# Patient Record
Sex: Male | Born: 1960 | Race: White | Hispanic: No | Marital: Married | Smoking: Current every day smoker
Health system: Southern US, Community
[De-identification: ages and names within clinical notes are randomized; demographics above are authoritative.]

## PROBLEM LIST (undated history)

## (undated) DIAGNOSIS — I2510 Atherosclerotic heart disease of native coronary artery without angina pectoris: Secondary | ICD-10-CM

## (undated) DIAGNOSIS — I10 Essential (primary) hypertension: Secondary | ICD-10-CM

## (undated) DIAGNOSIS — K219 Gastro-esophageal reflux disease without esophagitis: Secondary | ICD-10-CM

## (undated) DIAGNOSIS — Z720 Tobacco use: Secondary | ICD-10-CM

## (undated) DIAGNOSIS — I5189 Other ill-defined heart diseases: Secondary | ICD-10-CM

## (undated) DIAGNOSIS — T7840XA Allergy, unspecified, initial encounter: Secondary | ICD-10-CM

## (undated) DIAGNOSIS — J449 Chronic obstructive pulmonary disease, unspecified: Secondary | ICD-10-CM

## (undated) DIAGNOSIS — I201 Angina pectoris with documented spasm: Secondary | ICD-10-CM

## (undated) DIAGNOSIS — C4490 Unspecified malignant neoplasm of skin, unspecified: Secondary | ICD-10-CM

## (undated) DIAGNOSIS — N400 Enlarged prostate without lower urinary tract symptoms: Secondary | ICD-10-CM

## (undated) DIAGNOSIS — C4492 Squamous cell carcinoma of skin, unspecified: Secondary | ICD-10-CM

## (undated) DIAGNOSIS — E785 Hyperlipidemia, unspecified: Secondary | ICD-10-CM

## (undated) DIAGNOSIS — M1990 Unspecified osteoarthritis, unspecified site: Secondary | ICD-10-CM

## (undated) DIAGNOSIS — Z9289 Personal history of other medical treatment: Secondary | ICD-10-CM

## (undated) DIAGNOSIS — I219 Acute myocardial infarction, unspecified: Secondary | ICD-10-CM

## (undated) DIAGNOSIS — F419 Anxiety disorder, unspecified: Secondary | ICD-10-CM

## (undated) DIAGNOSIS — Z87442 Personal history of urinary calculi: Secondary | ICD-10-CM

## (undated) DIAGNOSIS — F411 Generalized anxiety disorder: Secondary | ICD-10-CM

## (undated) DIAGNOSIS — C801 Malignant (primary) neoplasm, unspecified: Secondary | ICD-10-CM

## (undated) DIAGNOSIS — C4491 Basal cell carcinoma of skin, unspecified: Secondary | ICD-10-CM

## (undated) HISTORY — DX: Generalized anxiety disorder: F41.1

## (undated) HISTORY — DX: Allergy, unspecified, initial encounter: T78.40XA

## (undated) HISTORY — DX: Personal history of other medical treatment: Z92.89

## (undated) HISTORY — DX: Chronic obstructive pulmonary disease, unspecified: J44.9

## (undated) HISTORY — DX: Benign prostatic hyperplasia without lower urinary tract symptoms: N40.0

## (undated) HISTORY — DX: Tobacco use: Z72.0

## (undated) HISTORY — PX: BACK SURGERY: SHX140

## (undated) HISTORY — DX: Gastro-esophageal reflux disease without esophagitis: K21.9

## (undated) HISTORY — DX: Basal cell carcinoma of skin, unspecified: C44.91

## (undated) HISTORY — DX: Atherosclerotic heart disease of native coronary artery without angina pectoris: I25.10

## (undated) HISTORY — DX: Unspecified malignant neoplasm of skin, unspecified: C44.90

## (undated) HISTORY — DX: Other ill-defined heart diseases: I51.89

## (undated) HISTORY — PX: VASECTOMY: SHX75

## (undated) HISTORY — DX: Hyperlipidemia, unspecified: E78.5

## (undated) HISTORY — DX: Squamous cell carcinoma of skin, unspecified: C44.92

## (undated) HISTORY — DX: Anxiety disorder, unspecified: F41.9

## (undated) HISTORY — PX: TONSILLECTOMY: SUR1361

## (undated) HISTORY — DX: Unspecified osteoarthritis, unspecified site: M19.90

## (undated) HISTORY — PX: NASAL SINUS SURGERY: SHX719

## (undated) HISTORY — DX: Angina pectoris with documented spasm: I20.1

## (undated) HISTORY — PX: OTHER SURGICAL HISTORY: SHX169

---
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
## 1996-08-31 HISTORY — PX: BACK SURGERY: SHX140

## 1998-08-31 HISTORY — PX: OTHER SURGICAL HISTORY: SHX169

## 2001-09-09 ENCOUNTER — Encounter: Payer: Self-pay | Admitting: Family Medicine

## 2002-07-25 ENCOUNTER — Inpatient Hospital Stay (HOSPITAL_COMMUNITY): Admission: RE | Admit: 2002-07-25 | Discharge: 2002-07-26 | Payer: Self-pay | Admitting: Neurosurgery

## 2002-07-25 ENCOUNTER — Encounter: Payer: Self-pay | Admitting: Neurosurgery

## 2004-12-23 ENCOUNTER — Ambulatory Visit: Payer: Self-pay | Admitting: Otolaryngology

## 2005-02-16 ENCOUNTER — Emergency Department: Payer: Self-pay | Admitting: Emergency Medicine

## 2005-02-16 ENCOUNTER — Other Ambulatory Visit: Payer: Self-pay

## 2006-02-05 ENCOUNTER — Emergency Department: Payer: Self-pay | Admitting: Emergency Medicine

## 2006-04-29 ENCOUNTER — Emergency Department: Payer: Self-pay

## 2006-05-14 ENCOUNTER — Emergency Department: Payer: Self-pay | Admitting: Emergency Medicine

## 2006-05-24 ENCOUNTER — Emergency Department: Payer: Self-pay | Admitting: Emergency Medicine

## 2008-09-20 ENCOUNTER — Ambulatory Visit: Payer: Self-pay | Admitting: Family Medicine

## 2008-09-20 DIAGNOSIS — R5383 Other fatigue: Secondary | ICD-10-CM | POA: Insufficient documentation

## 2008-09-20 DIAGNOSIS — R5381 Other malaise: Secondary | ICD-10-CM | POA: Insufficient documentation

## 2008-09-20 DIAGNOSIS — R079 Chest pain, unspecified: Secondary | ICD-10-CM | POA: Insufficient documentation

## 2008-09-20 DIAGNOSIS — F528 Other sexual dysfunction not due to a substance or known physiological condition: Secondary | ICD-10-CM | POA: Insufficient documentation

## 2008-09-21 DIAGNOSIS — K219 Gastro-esophageal reflux disease without esophagitis: Secondary | ICD-10-CM | POA: Insufficient documentation

## 2008-09-24 DIAGNOSIS — R7309 Other abnormal glucose: Secondary | ICD-10-CM | POA: Insufficient documentation

## 2008-09-24 LAB — CONVERTED CEMR LAB
ALT: 21 units/L (ref 0–53)
AST: 22 units/L (ref 0–37)
Albumin: 3.8 g/dL (ref 3.5–5.2)
Alkaline Phosphatase: 79 units/L (ref 39–117)
BUN: 15 mg/dL (ref 6–23)
Basophils Absolute: 0 10*3/uL (ref 0.0–0.1)
Basophils Relative: 0.1 % (ref 0.0–3.0)
Bilirubin, Direct: 0.1 mg/dL (ref 0.0–0.3)
CO2: 27 meq/L (ref 19–32)
Calcium: 8.8 mg/dL (ref 8.4–10.5)
Chloride: 104 meq/L (ref 96–112)
Cholesterol: 192 mg/dL (ref 0–200)
Creatinine, Ser: 1 mg/dL (ref 0.4–1.5)
Eosinophils Absolute: 0.3 10*3/uL (ref 0.0–0.7)
Eosinophils Relative: 3.7 % (ref 0.0–5.0)
GFR calc Af Amer: 103 mL/min
GFR calc non Af Amer: 85 mL/min
Glucose, Bld: 144 mg/dL — ABNORMAL HIGH (ref 70–99)
HCT: 45.9 % (ref 39.0–52.0)
HDL: 28.9 mg/dL — ABNORMAL LOW (ref >39.0)
Hemoglobin: 15.7 g/dL (ref 13.0–17.0)
LDL Cholesterol: 148 mg/dL — ABNORMAL HIGH (ref 0–99)
Lymphocytes Relative: 24.6 % (ref 12.0–46.0)
MCHC: 34.1 g/dL (ref 30.0–36.0)
MCV: 93.3 fL (ref 78.0–100.0)
Monocytes Absolute: 0.7 10*3/uL (ref 0.1–1.0)
Monocytes Relative: 7.3 % (ref 3.0–12.0)
Neutro Abs: 5.9 10*3/uL (ref 1.4–7.7)
Neutrophils Relative %: 64.3 % (ref 43.0–77.0)
PSA: 1.06 ng/mL (ref 0.10–4.00)
Platelets: 238 10*3/uL (ref 150–400)
Potassium: 4.2 meq/L (ref 3.5–5.1)
RBC: 4.92 M/uL (ref 4.22–5.81)
RDW: 13.7 % (ref 11.5–14.6)
Sodium: 138 meq/L (ref 135–145)
Total Bilirubin: 0.7 mg/dL (ref 0.3–1.2)
Total CHOL/HDL Ratio: 6.6
Total Protein: 6.7 g/dL (ref 6.0–8.3)
Triglycerides: 77 mg/dL (ref 0–149)
VLDL: 15 mg/dL (ref 0–40)
WBC: 9.2 10*3/uL (ref 4.5–10.5)

## 2008-10-10 ENCOUNTER — Encounter (INDEPENDENT_AMBULATORY_CARE_PROVIDER_SITE_OTHER): Payer: Self-pay | Admitting: *Deleted

## 2010-03-20 ENCOUNTER — Ambulatory Visit: Payer: Self-pay | Admitting: Family Medicine

## 2010-03-20 ENCOUNTER — Observation Stay (HOSPITAL_COMMUNITY): Admission: EM | Admit: 2010-03-20 | Discharge: 2010-03-21 | Payer: Self-pay | Admitting: Emergency Medicine

## 2010-03-20 ENCOUNTER — Ambulatory Visit: Payer: Self-pay | Admitting: Cardiology

## 2010-03-20 ENCOUNTER — Telehealth: Payer: Self-pay | Admitting: Family Medicine

## 2010-03-20 DIAGNOSIS — M542 Cervicalgia: Secondary | ICD-10-CM | POA: Insufficient documentation

## 2010-03-20 DIAGNOSIS — R209 Unspecified disturbances of skin sensation: Secondary | ICD-10-CM | POA: Insufficient documentation

## 2010-03-20 DIAGNOSIS — R61 Generalized hyperhidrosis: Secondary | ICD-10-CM | POA: Insufficient documentation

## 2010-03-21 ENCOUNTER — Encounter: Payer: Self-pay | Admitting: Cardiovascular Disease

## 2010-03-26 ENCOUNTER — Telehealth: Payer: Self-pay | Admitting: Cardiovascular Disease

## 2010-04-02 ENCOUNTER — Ambulatory Visit: Payer: Self-pay | Admitting: Cardiovascular Disease

## 2010-04-04 ENCOUNTER — Ambulatory Visit: Payer: Self-pay | Admitting: Cardiovascular Disease

## 2010-04-09 ENCOUNTER — Ambulatory Visit: Payer: Self-pay | Admitting: Family Medicine

## 2010-04-09 DIAGNOSIS — M25519 Pain in unspecified shoulder: Secondary | ICD-10-CM | POA: Insufficient documentation

## 2010-04-09 DIAGNOSIS — E782 Mixed hyperlipidemia: Secondary | ICD-10-CM | POA: Insufficient documentation

## 2010-07-09 ENCOUNTER — Ambulatory Visit: Payer: Self-pay | Admitting: Family Medicine

## 2010-07-09 DIAGNOSIS — M545 Low back pain, unspecified: Secondary | ICD-10-CM | POA: Insufficient documentation

## 2010-07-09 DIAGNOSIS — N289 Disorder of kidney and ureter, unspecified: Secondary | ICD-10-CM | POA: Insufficient documentation

## 2010-07-09 DIAGNOSIS — R3129 Other microscopic hematuria: Secondary | ICD-10-CM | POA: Insufficient documentation

## 2010-07-09 LAB — CONVERTED CEMR LAB
Bilirubin Urine: NEGATIVE
Glucose, Urine, Semiquant: NEGATIVE
Ketones, urine, test strip: NEGATIVE
Nitrite: NEGATIVE
Protein, U semiquant: 30
Specific Gravity, Urine: 1.025
Urobilinogen, UA: 0.2
WBC Urine, dipstick: NEGATIVE
pH: 6

## 2010-07-10 LAB — CONVERTED CEMR LAB
BUN: 12 mg/dL (ref 6–23)
CO2: 28 meq/L (ref 19–32)
Calcium: 9.1 mg/dL (ref 8.4–10.5)
Chloride: 105 meq/L (ref 96–112)
Creatinine, Ser: 0.9 mg/dL (ref 0.4–1.5)
GFR calc non Af Amer: 97.33 mL/min (ref >60)
Glucose, Bld: 86 mg/dL (ref 70–99)
Potassium: 3.9 meq/L (ref 3.5–5.1)
Sodium: 141 meq/L (ref 135–145)

## 2010-07-16 ENCOUNTER — Encounter: Payer: Self-pay | Admitting: Family Medicine

## 2010-08-31 HISTORY — PX: KNEE ARTHROSCOPY W/ PARTIAL MEDIAL MENISCECTOMY: SHX1882

## 2010-09-28 LAB — CONVERTED CEMR LAB
ALT: 11 units/L (ref 0–53)
ALT: 11 units/L (ref 0–53)
AST: 13 units/L (ref 0–37)
AST: 18 units/L (ref 0–37)
Albumin: 4.6 g/dL (ref 3.5–5.2)
Albumin: 4.8 g/dL (ref 3.5–5.2)
Alkaline Phosphatase: 72 units/L (ref 39–117)
Alkaline Phosphatase: 77 units/L (ref 39–117)
BUN: 15 mg/dL (ref 6–23)
Basophils Absolute: 0.1 10*3/uL (ref 0.0–0.1)
Basophils Relative: 0.5 % (ref 0.0–3.0)
Bilirubin, Direct: 0.1 mg/dL (ref 0.0–0.3)
Bilirubin, Direct: 0.1 mg/dL (ref 0.0–0.3)
CK-MB: 1.3 ng/mL (ref 0.3–4.0)
CO2: 29 meq/L (ref 19–32)
Calcium: 9.3 mg/dL (ref 8.4–10.5)
Chloride: 107 meq/L (ref 96–112)
Cholesterol: 179 mg/dL (ref 0–200)
Creatinine, Ser: 1 mg/dL (ref 0.4–1.5)
Eosinophils Absolute: 0.3 10*3/uL (ref 0.0–0.7)
Eosinophils Relative: 1.7 % (ref 0.0–5.0)
GFR calc non Af Amer: 83.12 mL/min (ref >60)
Glucose, Bld: 111 mg/dL — ABNORMAL HIGH (ref 70–99)
HCT: 45.3 % (ref 39.0–52.0)
HDL: 36 mg/dL — ABNORMAL LOW (ref >39)
Hemoglobin: 15.4 g/dL (ref 13.0–17.0)
Indirect Bilirubin: 0.2 mg/dL (ref 0.0–0.9)
LDL Cholesterol: 123 mg/dL — ABNORMAL HIGH (ref 0–99)
Lymphocytes Relative: 16.1 % (ref 12.0–46.0)
Lymphs Abs: 2.6 10*3/uL (ref 0.7–4.0)
MCHC: 34.1 g/dL (ref 30.0–36.0)
MCV: 94.3 fL (ref 78.0–100.0)
Monocytes Absolute: 1.1 10*3/uL — ABNORMAL HIGH (ref 0.1–1.0)
Monocytes Relative: 7.1 % (ref 3.0–12.0)
Neutro Abs: 12.1 10*3/uL — ABNORMAL HIGH (ref 1.4–7.7)
Neutrophils Relative %: 74.6 % (ref 43.0–77.0)
PSA: 1.06 ng/mL (ref 0.10–4.00)
Platelets: 209 10*3/uL (ref 150.0–400.0)
Potassium: 4.3 meq/L (ref 3.5–5.1)
RBC: 4.8 M/uL (ref 4.22–5.81)
RDW: 14.9 % — ABNORMAL HIGH (ref 11.5–14.6)
Relative Index: 1.6 (ref 0.0–2.5)
Sodium: 145 meq/L (ref 135–145)
Total Bilirubin: 0.3 mg/dL (ref 0.3–1.2)
Total Bilirubin: 0.5 mg/dL (ref 0.3–1.2)
Total CHOL/HDL Ratio: 5
Total CK: 81 units/L (ref 7–232)
Total Protein: 7.2 g/dL (ref 6.0–8.3)
Total Protein: 7.4 g/dL (ref 6.0–8.3)
Triglycerides: 99 mg/dL (ref <150)
VLDL: 20 mg/dL (ref 0–40)
WBC: 16.2 10*3/uL — ABNORMAL HIGH (ref 4.5–10.5)

## 2010-09-30 NOTE — Assessment & Plan Note (Signed)
Summary: NP6/GLC   Visit Type:  Initial Consult Primary Provider:  Hannah Beat MD  CC:  c/o sharp pains in chest with shortness of breath..  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 Dallas Schimke for further Evaluation. D-dimer was 0.66 and he had a negative CT scan.   a stress echocardiogram  was performed that showed no significant ischemia. He achieved a heart rate greater than 150. Cardiac enzymes were negative during his hospital stay.  He presents today and reports no chest pain. He believes that much of his problem is due to his underlying neck discomfort with paravertebral muscle spasms. He has been unable to tolerate the muscle relaxants medicine as it makes him feel sleepy in the morning. He denies any chest pain with activity.   Cholesterol 151, triglycerides 97, HDL 30, LDL 102.   Current Medications (verified): 1)  Adult Aspirin Low Strength 81 Mg Tbdp (Aspirin) .... Take One By Mouth Daily 2)  Tizanidine Hcl 4 Mg Tabs (Tizanidine Hcl) .Marland Kitchen.. 1 By Mouth At Bedtime 3)  Fish Oil 1000 Mg Caps (Omega-3 Fatty Acids) .Marland Kitchen.. 1 Once Daily 4)  Prilosec 20 Mg Cpdr (Omeprazole) .Marland Kitchen.. 1 Once Daily  Allergies (verified): No Known Drug Allergies  Past History:  Past Medical History: Last updated: 09/20/2008 OA Smoker GERD  Past Surgical History: Last updated: 09/20/2008 Lower back surgery, 2000 Neck, fusion, 2002 Sinus surgery, 2005  Family History: Last updated: 09/20/2008 Father, CAD, OA Brother, d/cCAD, MI, 25, 32, MI Brother, d/c MI, 60 HTN  Social History: Last updated: 09/20/2008 Son of [employer] ([employer]) [employer] now Divorced with  GF 50 pack year history, 30 years + ETOH, 12/week No Drugs Likes to work out  Review of Systems  The patient denies fever, weight loss, weight gain, vision loss, decreased hearing, hoarseness, chest pain, syncope, dyspnea on exertion, peripheral edema, prolonged cough, abdominal pain, incontinence, muscle weakness, depression, and enlarged lymph nodes.         chronic neck pain, radiating to shoulders  Vital Signs:  Patient profile:   50 year old male Height:      65.25 inches Weight:      141 pounds BMI:     23.37 Pulse rate:   61 / minute BP sitting:   129 / 85  (left arm) Cuff size:   regular  Vitals Entered By: Bishop Dublin, CMA (April 04, 2010 10:23 AM)  Physical Exam  General:  Well developed, well nourished, in no acute distress. Head:  normocephalic and atraumatic Neck:  Neck supple, no JVD. No masses, thyromegaly or abnormal cervical nodes. Lungs:  Clear bilaterally to auscultation and percussion. Heart:  Non-displaced PMI, chest non-tender; regular rate and rhythm, S1, S2 without murmurs, rubs or gallops. Carotid upstroke normal, no bruit. Normal abdominal aortic size, no bruits.  Pedals normal pulses. No edema, no varicosities. Abdomen:  Bowel sounds positive; abdomen soft and non-tender without masses Msk:  Back normal, normal gait. Muscle strength and tone normal. Pulses:  pulses normal in all 4 extremities Extremities:  No clubbing or cyanosis. Neurologic:  Alert and oriented x 3. Skin:  Intact without lesions or rashes. Psych:  Normal affect.  Impression & Recommendations:  Problem # 1:  CHEST PAIN (ICD-786.50) no significant chest pain symptoms concerning for angina. He had a [facility]. No further workup needed at this time. we will check his cholesterol today. He has indicated that he would like to treat his lipids aggressively. I recommend we have a goal LDL less than 100 given his strong family history  and continued smoking.  His updated medication list for this problem includes:    Adult Aspirin Low Strength 81 Mg Tbdp (Aspirin) .Marland Kitchen... Take one by mouth daily  Orders: EKG w/ Interpretation (93000)  Problem # 2:  SMOKER (ICD-305.1) We have strongly encouraged him to stop smoking. He does not want to try change it or patches at this time. he does have a very strong family history of coronary artery disease. For this reason alone, he is at high risk and needs to stop smoking.  Other Orders: T-Hepatic Function [phone]) T-Cholesterol Serum Total [phone]) T-Lipid Profile [id number])  Patient Instructions: 1)  Your physician recommends that you have a FASTING lipid profile: today (lip/lft) 2)  Your physician recommends that you continue on your current medications as directed. Please refer to the Current Medication list given to you today.

## 2010-09-30 NOTE — Assessment & Plan Note (Signed)
Summary: NECK SPASMS   Vital Signs:  Patient profile:   50 year old male Height:      65.25 inches Weight:      142.2 pounds BMI:     23.57 Temp:     98.8 degrees F oral Pulse rate:   68 / minute Pulse rhythm:   regular BP sitting:   120 / 76  (left arm) Cuff size:   regular  Vitals Entered By: Benny Lennert CMA Duncan Dull) (March 20, 2010 12:12 PM)  History of Present Illness: Chief complaint neck spasms  50 year old male who presents initially with neck pain and spasms but also not feeling well and "just not feeling right" for a couple of days.  While getting neck xrays, the patient got exceedingly sweaty, diaphoretic, pale, had difficulty walking, and threw up multiple times. Was placed in reverse trendelenburg, and felt much better. Ultimately had some slight nausea and occ tingling in his left hand at the distal 1-3 finger tips.  Has a plate in his neck -- arms feel like they have a a lot of weight hanging on his neck with a great deal of muscle spasm and loss of motion in the neck. Doing   lot of commercial heating and air.   no numbness or tingling. Work up the other night and hands were asleep. Ten minutes that returned.  -- tingling developed L finger tips as above.  [facility] [facility].   No CP, No SOB  Cardiac Risk Factors include multiple family members with CAD, MI as early as in their 41's including brothers and father. 2 pack a day smoker. Active gentleman.  Allergies (verified): No Known Drug Allergies  Past History:  Past medical, surgical, family and social histories (including risk factors) reviewed, and no changes noted (except as noted below).  Past Medical History: Reviewed history from 09/20/2008 and no changes required. OA Smoker GERD  Past Surgical History: Reviewed history from 09/20/2008 and no changes required. Lower back surgery, 2000 Neck, fusion, 2002 Sinus surgery, 2005  Family History: Reviewed history from 09/20/2008 and no  changes required. Father, CAD, OA Brother, d/cCAD, MI, 68, 41, MI Brother, d/c MI, 62 HTN  Social History: Reviewed history from 09/20/2008 and no changes required. Son of Javel Hersh ([employer]) [employer] now Divorced with GF 50 pack year history, 30 years + ETOH, 12/week No Drugs Likes to work out  Review of Systems      See HPI General:  Complains of fatigue and sweats; denies fever. CV:  Complains of fainting, lightheadness, and near fainting; denies chest pain or discomfort, palpitations, shortness of breath with exertion, swelling of feet, and swelling of hands. Resp:  Denies cough, shortness of breath, sputum productive, and wheezing. GI:  Complains of nausea and vomiting. MS:  See HPI. Neuro:  See HPI.  Physical Exam  General:  Intermittently normal appearing vs ill appearing when acutely pale and sweating, nauseous and vomitting. Head:  Normocephalic and atraumatic without obvious abnormalities. No apparent alopecia or balding. Ears:  External ear exam shows no significant lesions or deformities.  Otoscopic examination reveals clear canals, tympanic membranes are intact bilaterally without bulging, retraction, inflammation or discharge. Hearing is grossly normal bilaterally. Nose:  no external deformity.   Mouth:  Oral mucosa and oropharynx without lesions or exudates.  Teeth in good repair. Neck:  No deformities, masses, or tenderness noted. Chest Wall:  No deformities, masses, tenderness or gynecomastia noted. Lungs:  Normal respiratory effort, chest expands symmetrically. Lungs  are clear to auscultation, no crackles or wheezes. Heart:  Normal rate and regular rhythm. S1 and S2 normal without gallop, murmur, click, rub or other extra sounds. Abdomen:  Bowel sounds positive,abdomen soft and non-tender without masses, organomegaly or hernias noted. Msk:  diffuse loss of motion in all directions in cervical spine with a negative spurlings c5-t1 intact. Extremities:   No clubbing, cyanosis, edema, or deformity noted with normal full range of motion of all joints.   Neurologic:  sensation intact to light touch and gait normal.  dtr 2+ Cervical Nodes:  No lymphadenopathy noted Psych:  Cognition and judgment appear intact. Alert and cooperative with normal attention span and concentration. No apparent delusions, illusions, hallucinations   Impression & Recommendations:  Problem # 1:  DIAPHORESIS (ICD-780.8) Assessment New EKG: Normal sinus rhythm. Normal axis, normal R wave progression, No acute ST elevation or depression.   Concerning with extremely bad family history, very large smoking history, nausea, diaphoresis, some slight L hand tingling. Confounding factor with h/o complex neck pathology, very large osteophyte formation - this may be referred from neck.  In light of this presentation, check labs below and will have the patient follow-up with Cardiology tomorrow. This could be an evolving anginal equivalent, d/w the patient that if he worsens in any way to call 911. He did not want to go to the hospital right now.  >45 minutes spent in total face to face time with the patient with >50% of time spent in counselling and coordination of care   Orders: TLB-BMP (Basic Metabolic Panel-BMET) (80048-METABOL) TLB-CBC Platelet - w/Differential (85025-CBCD) TLB-Hepatic/Liver Function Pnl (80076-HEPATIC) T-D-Dimer Fibrin Derivatives Quantitive ([id number]) Specimen Handling (53664) Cardiology Referral (Cardiology)  Problem # 2:  DISTURBANCE OF SKIN SENSATION (ICD-782.0) Assessment: New  Orders: Venipuncture (40347) TLB-Cardiac Panel ([id number]_63875-IEPP)  Problem # 3:  CERVICALGIA (ICD-723.1) Assessment: Deteriorated Films reviewed, fusion stable, large anterior spurs noted  His updated medication list for this problem includes:    Adult Aspirin Low Strength 81 Mg Tbdp (Aspirin) .Marland Kitchen... Take one by mouth daily    Tizanidine Hcl 4 Mg Tabs (Tizanidine  hcl) .Marland Kitchen... 1 by mouth at bedtime  Orders: T-Cervical Spine Comp 4 Views (72050TC)  Complete Medication List: 1)  Adult Aspirin Low Strength 81 Mg Tbdp (Aspirin) .... Take one by mouth daily 2)  Tizanidine Hcl 4 Mg Tabs (Tizanidine hcl) .Marland Kitchen.. 1 by mouth at bedtime  Other Orders: TLB-PSA (Prostate Specific Antigen) (84153-PSA)  Patient Instructions: 1)  Referral Appointment Information 2)  Day/Date: 3)  Time: 4)  Place/MD: 5)  Address: 6)  Phone/Fax: 7)  Patient given appointment information. Information/Orders faxed/mailed.  Prescriptions: TIZANIDINE HCL 4 MG TABS (TIZANIDINE HCL) 1 by mouth at bedtime  #30 x 1   Entered and Authorized by:   Hannah Beat MD   Signed by:   Hannah Beat MD on 03/20/2010   Method used:   [facility]       [address]       [place], [place]  [address]       Ph: [phone]       Fax: [phone]   RxID:   [id number]   Current Allergies (reviewed today): No known allergies

## 2010-09-30 NOTE — Assessment & Plan Note (Signed)
Summary: KIDNEY PAIN/CLE   Vital Signs:  Patient profile:   50 year old male Height:      65.25 inches Weight:      143.0 pounds BMI:     23.70 Temp:     98.2 degrees F oral Pulse rate:   64 / minute Pulse rhythm:   regular BP sitting:   110 / 70  (left arm) Cuff size:   regular  Vitals Entered By: Benny Lennert CMA Duncan Dull) (July 09, 2010 11:54 AM)  History of Present Illness: Chief complaint kidney pain  MR in 02/2010 of the abdomen with and without contrast revealing 8-mm lesion     in the upper pole of the left kidney remaining indeterminate     limited evaluation due to size and breathing artifact, this may     reflect hemorrhagic cyst, although small papillary-type renal cell     carcinoma could have similar appearance, no aggressive     characteristics are identified, simple cyst in the lower pole of     the left kidney with no acute abdominal findings.  Ultrasound would     be likely unhelpful.  MR without contrast follow up in 6 months is     recommended.  Began having pain in left mid to lower back pain..focally. Constant in past few weeks. Pain keeps him up at night. Increased frequency and urgency of urination. No blood in urine.  No fever. No puritis.  Nausea intermittantly. In past 2 weeks noted nodule in same area, althoug not tender.  No pain running down legs or any change in chronic low back pain.  Father with kidney stones..no family history of kidney cancer.  Problems Prior to Update: 1)  Unspecified Disorder of Kidney and Ureter  (ICD-593.9) 2)  Microscopic Hematuria  (ICD-599.72) 3)  Low Back Pain, Acute  (ICD-724.2) 4)  Shoulder Pain  (ICD-719.41) 5)  Hyperlipidemia  (ICD-272.4) 6)  Smoker  (ICD-305.1) 7)  Diaphoresis  (ICD-780.8) 8)  Disturbance of Skin Sensation  (ICD-782.0) 9)  Cervicalgia  (ICD-723.1) 10)  Other Abnormal Glucose  (ICD-790.29) 11)  Chest Pain  (ICD-786.50) 12)  Erectile Dysfunction  (ICD-302.72) 13)  Gerd   (ICD-530.81) 14)  Screening, Diabetes Mellitus  (ICD-V77.1) 15)  Screening For Lipoid Disorders  (ICD-V77.91) 16)  Fatigue  (ICD-780.79) 17)  Special Screening Malignant Neoplasm of Prostate  (ICD-V76.44)  Current Medications (verified): 1)  Adult Aspirin Low Strength 81 Mg Tbdp (Aspirin) .... Take One By Mouth Daily 2)  Fish Oil 1000 Mg Caps (Omega-3 Fatty Acids) .Marland Kitchen.. 1 Once Daily 3)  Prilosec 20 Mg Cpdr (Omeprazole) .Marland Kitchen.. 1 Once Daily 4)  Simvastatin 20 Mg Tabs (Simvastatin) .... Take One Tablet By Mouth Daily At Bedtime 5)  Gabapentin 300 Mg Caps (Gabapentin) .Marland Kitchen.. 1 By Mouth At Bedtime 6)  Multivitamins  Caps (Multiple Vitamin) .... One Tablet Dialy 7)  Hydrocodone-Acetaminophen 5-500 Mg Tabs (Hydrocodone-Acetaminophen) .Marland Kitchen.. 1 Tab By Mouth Two Times A Day As Needed For Pain  Allergies (verified): No Known Drug Allergies  Past History:  Past medical, surgical, family and social histories (including risk factors) reviewed, and no changes noted (except as noted below).  Past Medical History: Reviewed history from 04/09/2010 and no changes required. OA Smoker GERD Hyperlipidemia  Past Surgical History: Reviewed history from 04/09/2010 and no changes required. Lower back surgery, 2000 Neck, fusion, 2002 Sinus surgery, 2005  Stress echo, 03/2010, normal  Family History: Reviewed history from 09/20/2008 and no changes required. Father, CAD, OA Brother,  d/cCAD, MI, 33, 47, MI Brother, d/c MI, 48 HTN  Social History: Reviewed history from 09/20/2008 and no changes required. Son of Harvis Mabus ([employer]) [employer] now Divorced with GF 50 pack year history, 30 years + ETOH, 12/week No Drugs Likes to work out  Review of Systems General:  Complains of fatigue. CV:  Denies chest pain or discomfort. Resp:  Denies shortness of breath. GI:  Complains of abdominal pain. GU:  Denies dysuria.  Physical Exam  General:  thin apearing male in NAD Mouth:  MMM Neck:  no  carotid bruit or thyromegaly .ndoes  Lungs:  Normal respiratory effort, chest expands symmetrically. Lungs are clear to auscultation, no crackles or wheezes. Heart:  Normal rate and regular rhythm. S1 and S2 normal without gallop, murmur, click, rub or other extra sounds. Abdomen:  Bowel sounds positive,abdomen soft and non-tender without masses, organomegaly or hernias noted. Msk:  ttp in left lower back, near paraspinous muscle, near small nodule similar to cyst..not over CVA  Neg SLR, Neg Faber's  Decrease ROM flexion and ext due to pain Pulses:  R and L posterior tibial pulses are full and equal bilaterally  Extremities:  no edema Neurologic:  No cranial nerve deficits noted. Station and gait are normal. Plantar reflexes are down-going bilaterally. DTRs are symmetrical throughout. Sensory, motor and coordinative functions appear intact.   Impression & Recommendations:  Problem # 1:  UNSPECIFIED DISORDER OF KIDNEY AND URETER (ICD-593.9) Unclear cause of pain..not clearly due to finding on MRI.  ? due to MSK strain in low back? Pt feels this is not consistent with pain he has in low back in past. Given cysts seen in kidney, that now may be sympomatic...will eval with BMET and it is reasonable to  refer to urologist for recommendations for further eval as opposed to waiting until 6 month MRI repeat due. Small blood in urine..possible kidney stone although unlikely as none seen on recent imaging.  No cast seen suggesting renal failure.   No clear sign of infection.Marland Kitchenbut will verify with U culture.   Psuhj fluids and treat with vicodin as needed pain.  Orders: T-Culture, Urine ([id number]) Urology Referral (Urology) TLB-BMP (Basic Metabolic Panel-BMET) (80048-METABOL)  Problem # 2:  MICROSCOPIC HEMATURIA (ICD-599.72)  Orders: T-Culture, Urine ([id number]) Urology Referral (Urology)  Complete Medication List: 1)  Adult Aspirin Low Strength 81 Mg Tbdp (Aspirin) .... Take one by mouth  daily 2)  Fish Oil 1000 Mg Caps (Omega-3 fatty acids) .Marland Kitchen.. 1 once daily 3)  Prilosec 20 Mg Cpdr (Omeprazole) .Marland Kitchen.. 1 once daily 4)  Simvastatin 20 Mg Tabs (Simvastatin) .... Take one tablet by mouth daily at bedtime 5)  Gabapentin 300 Mg Caps (Gabapentin) .Marland Kitchen.. 1 by mouth at bedtime 6)  Multivitamins Caps (Multiple vitamin) .... One tablet dialy 7)  Hydrocodone-acetaminophen 5-500 Mg Tabs (Hydrocodone-acetaminophen) .Marland Kitchen.. 1 tab by mouth two times a day as needed for pain  Other Orders: Admin 1st Vaccine (30865) Flu Vaccine 44yrs + (78469) UA Dipstick W/ Micro (manual) (81000)  Patient Instructions: 1)  Push fluids. 2)  Vicodin as needed for pain. 3)   Referral Appointment Information 4)  Day/Date: 5)  Time: 6)  Place/MD: 7)  Address: 8)  Phone/Fax: 9)  Patient given appointment information. Information/Orders faxed/mailed.  Prescriptions: HYDROCODONE-ACETAMINOPHEN 5-500 MG TABS (HYDROCODONE-ACETAMINOPHEN) 1 tab by mouth two times a day as needed for pain  #30 x 0   Entered and Authorized by:   Kerby Nora MD   Signed by:  Kerby Nora MD on 07/09/2010   Method used:   Print then Give to Patient   RxID:   [id number]    Orders Added: 1)  Admin 1st Vaccine [90471] 2)  Flu Vaccine 62yrs + [14782] 3)  UA Dipstick W/ Micro (manual) [81000] 4)  T-Culture, Urine [[id number]] 5)  Urology Referral [Urology] 6)  TLB-BMP (Basic Metabolic Panel-BMET) [80048-METABOL] 7)  Est. Patient Level IV [78469]    Current Allergies (reviewed today): No known allergies                Flu Vaccine Consent Questions     Do you have a history of severe allergic reactions to this vaccine? no    Any prior history of allergic reactions to egg and/or gelatin? no    Do you have a sensitivity to the preservative Thimersol? no    Do you have a past history of Guillan-Barre Syndrome? no    Do you currently have an acute febrile illness? no    Have you ever had a severe reaction to  latex? no    Vaccine information given and explained to patient? yes    Are you currently pregnant? no    Lot Number:[id number]   Exp Date:02/28/2011   Site Given  Left Deltoid IM  [email or web address]  Laboratory Results   Urine Tests  Date/Time Received: July 09, 2010 12:42 PM  Date/Time Reported: July 09, 2010 12:42 PM   Routine Urinalysis   Color: yellow Appearance: Clear Glucose: negative   (Normal Range: Negative) Bilirubin: negative   (Normal Range: Negative) Ketone: negative   (Normal Range: Negative) Spec. Gravity: 1.025   (Normal Range: 1.003-1.035) Blood: moderate   (Normal Range: Negative) pH: 6.0   (Normal Range: 5.0-8.0) Protein: 30   (Normal Range: Negative) Urobilinogen: 0.2   (Normal Range: 0-1) Nitrite: negative   (Normal Range: Negative) Leukocyte Esterace: negative   (Normal Range: Negative)  Urine Microscopic WBC/HPF: none RBC/HPF: 2-3 Casts/LPF: none        Appended Document: KIDNEY PAIN/CLE Fax note to urologist along wth BMET, U culture and MRI. Patient has appt on 11/16  Appended Document: KIDNEY PAIN/CLE Shirlee Limerick has already faxed info.Consuello Masse CMA

## 2010-09-30 NOTE — Consult Note (Signed)
Summary: [facility]   Imported By: Maryln Gottron 07/25/2010 15:54:27  _____________________________________________________________________  External Attachment:    Type:   Image     Comment:   External Document

## 2010-09-30 NOTE — Letter (Signed)
Summary: [facility]/Dr. Gwen Pounds  [facility]/Dr. Gwen Pounds   Imported By: Eleonore Chiquito 09/25/2008 15:06:29  _____________________________________________________________________  External Attachment:    Type:   Image     Comment:   External Document  Appended Document: [facility]/Dr. Gwen Pounds Records reviewed. Neg CP work-up. Suspect that ETT was done at [facility] outpatient by Dr. Gwen Pounds - not done at [facility]. Will discuss at follow-up.

## 2010-09-30 NOTE — Assessment & Plan Note (Signed)
Summary: NEW PT TO EST/CLE   Vital Signs:  Patient Profile:   50 Years Old Male Height:     65.25 inches Weight:      143 pounds BMI:     23.70 Temp:     98.3 degrees F oral Pulse rate:   68 / minute Pulse rhythm:   regular BP sitting:   100 / 62  (right arm) Cuff size:   regular  Vitals Entered By: Lowella Petties (September 20, 2008 2:01 PM)                 Chief Complaint:  New patient to establish care.  History of Present Illness: 50 year old male patient here as a new patient here to establish care:  1. Here with his girlfriend, brother died suddenly at 40, another brother had a MI as well. Worried about that - no recent labs, chol check, etc.  2. Erectile difficulties: the patient is here with his girlfriend, and they have some questions about sexual issues. On 2 occasions, he has had an episode where he lost his reduction, but afterwards he was able to achieve an erection and complete intercourse. Subsequently, I interviewed the patient alone. He is routinely able to achieve intercourse. He is able to penetrate the vagina. He is able masturbate successfully alone. he is not particularly worried about this, however his girlfriend is, which prompted a visit. He did occasionally have episodes where even with this prior wife when he was thinking about other things during sex, he would lose erection.  100/62  3. History of gastroesophageal reflux disease: The patient is control this with diet maintenance, he is decreased fatty foods, tomatoes, and other things that exacerbate including citrus and controlled well without medication.  4. h/o CP admision: he had some chest pain with radiating pain and numbness down his LEFT arm and was subsequently admitted to the hospital. He had laboratories and EKGs which were negative per his report. No official hospital records are available. Subsequently, the patient had an outpatient stress test on a treadmill which was negative. 2005, CP,  admitted to hospital. Ruled out for heart attack. Left arm numb.  [facility], r/o.  Stress Test, on treadmill came back normal.      Current Allergies: No known allergies   Past Medical History:    OA    Smoker    GERD  Past Surgical History:    Lower back surgery, 2000    Neck, fusion, 2002    Sinus surgery, 2005   Family History:    Father, CAD, OA    Brother, d/cCAD, MI, 67, 58, MI    Brother, d/c MI, 61    HTN  Social History:    Son of Sylvester Minton ([employer])    [employer] now    Divorced with GF    50 pack year history, 30 years    + ETOH, 12/week    No Drugs    Likes to work out    Review of Systems      See HPI       Otherwise, the pertinent positives and negatives are listed above and in the HPI, otherwise a full review of systems has been reviewed and is negative unless noted positive.   General      Denies chills, fatigue, fever, and sweats.  ENT      Denies nasal congestion.  CV      See HPI      Denies  chest pain or discomfort, difficulty breathing at night, difficulty breathing while lying down, fainting, leg cramps with exertion, lightheadness, near fainting, palpitations, and shortness of breath with exertion.  Resp      Denies cough, shortness of breath, and wheezing.  GI      Denies nausea and vomiting.  GU      See HPI      Complains of erectile dysfunction.      Denies genital sores, hematuria, and incontinence.      no exposure to STDs. No penile discharge. No ulceration.  Derm      Denies lesion(s) and rash.  Neuro      Denies headaches, numbness, and tingling.  Psych      Denies anxiety and depression.  Endo      Denies excessive urination, heat intolerance, and polyuria.   Physical Exam  General:     Well-developed,well-nourished,in no acute distress; alert,appropriate and cooperative throughout examination Head:     Normocephalic and atraumatic without obvious abnormalities. No apparent alopecia or balding.  Eyes:     vision grossly intact.   Ears:     no external deformities.   Nose:     no external deformity.   Mouth:     Oral mucosa and oropharynx without lesions or exudates.  Teeth in good repair. Neck:     No deformities, masses, or tenderness noted. Lungs:     Normal respiratory effort, chest expands symmetrically. Lungs are clear to auscultation, no crackles or wheezes. Heart:     Normal rate and regular rhythm. S1 and S2 normal without gallop, murmur, click, rub or other extra sounds. Abdomen:     Bowel sounds positive,abdomen soft and non-tender without masses, organomegaly or hernias noted. Msk:     normal ROM.   Extremities:     No clubbing, cyanosis, edema, or deformity noted with normal full range of motion of all joints.   Neurologic:     alert & oriented X3, sensation intact to light touch, and gait normal.   Cervical Nodes:     No lymphadenopathy noted Psych:     Cognition and judgment appear intact. Alert and cooperative with normal attention span and concentration. No apparent delusions, illusions, hallucinations    Impression & Recommendations:  Problem # 1:  ERECTILE DYSFUNCTION (ICD-302.72) Assessment: New counseled pretty extensively about this. It sounds as if this is normal physiological loss of erection which is not uncommon particularly with age. It doesn't sound like true impotence. He is not having anorgasmia.  Counselled and reassurance given.  Problem # 2:  CHEST PAIN (ICD-786.50) Assessment: New Approx. 5 years ago, but will get records from [facility], stress test results - completely asymptomatic now, so would not stress again unless recommended in prior stress test.  f/u if any chest pain - with FH, low threshold for repeat stress test.  Problem # 3:  GERD (ICD-530.81) Assessment: New stable  Problem # 4:  SPECIAL SCREENING MALIGNANT NEOPLASM OF PROSTATE (ICD-V76.44)  Orders: TLB-PSA (Prostate Specific Antigen) (84153-PSA) Venipuncture  ([address])   Problem # 5:  FATIGUE (ICD-780.79) Assessment: New  Orders: TLB-CBC Platelet - w/Differential (85025-CBCD) TLB-Hepatic/Liver Function Pnl (80076-HEPATIC) Venipuncture (60454)   Problem # 6:  SCREENING, DIABETES MELLITUS (ICD-V77.1) Assessment: New  Orders: TLB-BMP (Basic Metabolic Panel-BMET) (80048-METABOL) Venipuncture (09811)   Complete Medication List: 1)  Adult Aspirin Low Strength 81 Mg Tbdp (Aspirin) .... Take one by mouth daily  Other Orders: TLB-Lipid Panel (80061-LIPID)   Patient Instructions: 1)  follow-up in the next few months for a full physical    Prior Medications: ADULT ASPIRIN LOW STRENGTH 81 MG TBDP (ASPIRIN) take one by mouth daily Current Allergies: No known allergies

## 2010-09-30 NOTE — Progress Notes (Signed)
Summary: please call pt  Phone Note Call from Patient Call back at [facility] Phone [phone]   Caller: Spouse Summary of Call: Please call patient with lab results, home number is the best to call. Initial call taken by: Lowella Petties CMA,  March 20, 2010 4:38 PM  Follow-up for Phone Call        d/w patient -- + D- Dimer, needs further evaluation  Going to [facility] ER ASAP now Follow-up by: Hannah Beat MD,  March 20, 2010 4:57 PM

## 2010-09-30 NOTE — Assessment & Plan Note (Signed)
Summary: f/u after heart md/dlo   Vital Signs:  Patient profile:   50 year old male Height:      65.25 inches Weight:      148.0 pounds BMI:     24.53 Temp:     98.2 degrees F oral Pulse rate:   64 / minute Pulse rhythm:   regular BP sitting:   120 / 80  (left arm) Cuff size:   regular  Vitals Entered By: Benny Lennert CMA Duncan Dull) (April 09, 2010 12:39 PM)  History of Present Illness: Chief complaint follow up heart md  50 year old male:  Neck is really hurting and bad.  favoring his left.  R rhomboid and scapular dykinesis he has an area that he describes a hot spot, this has been long-standing and in his left lower scapular region on the right. We'll burning ache and has been doing so for years. Right now is having multiple pains around the rhomboid and trap  Kidney lesion: asymptomatic, noted below on MRA of the abdomen  Chest pain: chest pain has been ruled out, negative stress echo, negative CT angiogram.  Hyperlipidemia, currently starting on simvastatin 20 mg daily.  MR of the abdomen with and without contrast revealing 8-mm lesion     in the upper pole of the left kidney remaining indeterminate     limited evaluation due to size and breathing artifact, this may     reflect hemorrhagic cyst, although small papillary-type renal cell     carcinoma could have similar appearance, no aggressive     characteristics are identified, simple cyst in the lower pole of     the left kidney with no acute abdominal findings.  Ultrasound would     be likely unhelpful.  MR without contrast follow up in 6 months is     recommended.  Allergies (verified): No Known Drug Allergies  Past History:  Past Medical History: OA Smoker GERD Hyperlipidemia  Past Surgical History: Lower back surgery, 2000 Neck, fusion, 2002 Sinus surgery, 2005  Stress echo, 03/2010, normal  Review of Systems      See HPI CV:  Denies chest pain or discomfort and shortness of breath with  exertion. MS:  See HPI. Neuro:  Denies tingling.  Physical Exam  General:  Well-developed,well-nourished,in no acute distress; alert,appropriate and cooperative throughout examination Head:  Normocephalic and atraumatic without obvious abnormalities. No apparent alopecia or balding. Ears:  no external deformities.   Nose:  no external deformity.   Lungs:  Normal respiratory effort, chest expands symmetrically. Lungs are clear to auscultation, no crackles or wheezes. Heart:  Normal rate and regular rhythm. S1 and S2 normal without gallop, murmur, click, rub or other extra sounds. Msk:  tenderness to palpation focally at the rhomboid on the right. He also relatively compared to the rest the shoulder and upper extremity strength, has a deficit in terms of his lower scapular stabilizers. There is some mild scapular dyskinesis. Extremities:  No clubbing, cyanosis, edema, or deformity noted with normal full range of motion of all joints.   Cervical Nodes:  No lymphadenopathy noted Psych:  Cognition and judgment appear intact. Alert and cooperative with normal attention span and concentration. No apparent delusions, illusions, hallucinations   Impression & Recommendations:  Problem # 1:  SHOULDER PAIN (ICD-719.41) Assessment New sinuses rhomboid muscular deficit and scapular tip dyskinesis, in concert with a potential neuropathic pain area on the right  Trial of low level Neurontin at nighttime and capsaicin cream, and additionally  scapular stabilization   repeat MRI of the abdomen will be needed in 6 months.  The following medications were removed from the medication list:    Tizanidine Hcl 4 Mg Tabs (Tizanidine hcl) .Marland Kitchen... 1 by mouth at bedtime His updated medication list for this problem includes:    Adult Aspirin Low Strength 81 Mg Tbdp (Aspirin) .Marland Kitchen... Take one by mouth daily  Problem # 2:  HYPERLIPIDEMIA (ICD-272.4) Assessment: New  His updated medication list for this problem  includes:    Simvastatin 20 Mg Tabs (Simvastatin) .Marland Kitchen... Take one tablet by mouth daily at bedtime  Labs Reviewed: SGOT: 13 (04/04/2010)   SGPT: 11 (04/04/2010)   HDL:36 (04/04/2010), 28.9 (09/20/2008)  LDL:123 (04/04/2010), 148 (09/20/2008)  Chol:179 (04/04/2010), 192 (09/20/2008)  Trig:99 (04/04/2010), 77 (09/20/2008)  Problem # 3:  CHEST PAIN (ICD-786.50) Assessment: Improved  Problem # 4:  CERVICALGIA (ICD-723.1)  The following medications were removed from the medication list:    Tizanidine Hcl 4 Mg Tabs (Tizanidine hcl) .Marland Kitchen... 1 by mouth at bedtime His updated medication list for this problem includes:    Adult Aspirin Low Strength 81 Mg Tbdp (Aspirin) .Marland Kitchen... Take one by mouth daily  Complete Medication List: 1)  Adult Aspirin Low Strength 81 Mg Tbdp (Aspirin) .... Take one by mouth daily 2)  Fish Oil 1000 Mg Caps (Omega-3 fatty acids) .Marland Kitchen.. 1 once daily 3)  Prilosec 20 Mg Cpdr (Omeprazole) .Marland Kitchen.. 1 once daily 4)  Simvastatin 20 Mg Tabs (Simvastatin) .... Take one tablet by mouth daily at bedtime 5)  Gabapentin 300 Mg Caps (Gabapentin) .Marland Kitchen.. 1 by mouth at bedtime  Patient Instructions: 1)  CAPZAICIN CREAM -- CAN USE AS MUCH AS YOU WANT, APPLY WITH A RUBBER GLOVE OR BAGGY TO YOUR HOT SPOT. 2)  follow-up CPX in february 3)  Prephysical Labs, several days before, fasting 4)  BMP, HFP, FLP, CBC with diff, TSH, PSA: 272.4, v77.1, ,780.79, v76.44  Prescriptions: GABAPENTIN 300 MG CAPS (GABAPENTIN) 1 by mouth at bedtime  #30 x 3   Entered and Authorized by:   Hannah Beat MD   Signed by:   Hannah Beat MD on 04/09/2010   Method used:   Print then Give to Patient   RxID:   [id number]    Orders Added: 1)  Est. Patient Level IV [14782]   Current Allergies (reviewed today): No known allergies

## 2010-09-30 NOTE — Letter (Signed)
Summary: [name]  [place] at [facility]  [address] [place], [place] [address]   Phone: [phone]  Fax: [phone]    10/10/2008 MRN: [id number]  Isaiah Rangel [address] [facility] [address] [place], [place]  [address]   Dear Mr. ZEITZ,   Our records indicate that you missed your scheduled appointment with __lab___________________ on __2/10/10__________.  Please contact this office to reschedule your appointment as soon as possible.  It is important that you keep your scheduled appointments with your physician, so we can provide you the best care possible.  Please be advised that there may be a charge for "no show" appointments.    Sincerely,   [place] at [facility]  Appended Document: [place] No Show Letter Mail was returned

## 2010-09-30 NOTE — Progress Notes (Signed)
Summary: Treadmill Test  Phone Note Outgoing Call   Call placed by: Benedict Needy, RN,  March 26, 2010 11:30 AM Call placed to: Patient Summary of Call: Pt had recent admission to [facility], Dr. Eden Emms called and asked to have this pt set up @ [facility] for ETT.  Attempted to call pt to schedule. LMOM TCB . Initial call taken by: Benedict Needy, RN,  March 26, 2010 11:31 AM  Follow-up for Phone Call        spoke to pt reports having an echo and stress test at [facility]. will research on e chart and call pt back. Benedict Needy, RN  March 27, 2010 9:39 AM   pt had stress echo at [facility]. ETT scheduled for Aug 3 at 8 am LMOM TCB. Benedict Needy, RN  March 27, 2010 10:14 AM     pt aware of ETT scheduled.  Follow-up by: Benedict Needy, RN,  March 27, 2010 11:23 AM

## 2010-10-03 ENCOUNTER — Telehealth: Payer: Self-pay | Admitting: Family Medicine

## 2010-10-06 ENCOUNTER — Other Ambulatory Visit: Payer: Self-pay

## 2010-10-08 ENCOUNTER — Encounter: Payer: Self-pay | Admitting: Family Medicine

## 2010-10-08 NOTE — Progress Notes (Signed)
Summary: pt cancelled appt  Phone Note Call from Patient Call back at [facility] Phone [phone]   Caller: Patient Call For: Isaiah Beat MD Summary of Call: Pt cancelled his physical for 2/8.  He said since he was in the hospital in july, and he had several tests and labs done, he does not think he needs a physical at this time.                      Lowella Petties CMA, AAMA  October 03, 2010 4:07 PM   Follow-up for Phone Call        He can make this choice to cancel his cpx. Isaiah Beat MD  October 04, 2010 3:16 PM

## 2010-10-22 ENCOUNTER — Encounter: Payer: Self-pay | Admitting: Family Medicine

## 2010-11-06 NOTE — Letter (Signed)
Summary: [facility]   Imported By: Kassie Mends 10/30/2010 10:57:48  _____________________________________________________________________  External Attachment:    Type:   Image     Comment:   External Document

## 2010-11-15 LAB — POCT CARDIAC MARKERS
CKMB, poc: <1 ng/mL — ABNORMAL LOW (ref 1.0–8.0)
CKMB, poc: <1 ng/mL — ABNORMAL LOW (ref 1.0–8.0)
Myoglobin, poc: 38.6 ng/mL (ref 12–200)
Myoglobin, poc: 51.4 ng/mL (ref 12–200)
Troponin i, poc: <0.05 ng/mL (ref 0.00–0.09)
Troponin i, poc: <0.05 ng/mL (ref 0.00–0.09)

## 2010-11-15 LAB — DIFFERENTIAL
Basophils Absolute: 0.2 10*3/uL — ABNORMAL HIGH (ref 0.0–0.1)
Basophils Relative: 1 % (ref 0–1)
Eosinophils Absolute: 0.1 10*3/uL (ref 0.0–0.7)
Eosinophils Relative: 1 % (ref 0–5)
Lymphocytes Relative: 14 % (ref 12–46)
Lymphs Abs: 1.9 10*3/uL (ref 0.7–4.0)
Monocytes Absolute: 0.6 10*3/uL (ref 0.1–1.0)
Monocytes Relative: 4 % (ref 3–12)
Neutro Abs: 11.2 10*3/uL — ABNORMAL HIGH (ref 1.7–7.7)
Neutrophils Relative %: 80 % — ABNORMAL HIGH (ref 43–77)

## 2010-11-15 LAB — CBC
HCT: 39.2 % (ref 39.0–52.0)
HCT: 42.5 % (ref 39.0–52.0)
Hemoglobin: 13 g/dL (ref 13.0–17.0)
Hemoglobin: 14.6 g/dL (ref 13.0–17.0)
MCH: 31.1 pg (ref 26.0–34.0)
MCH: 32.1 pg (ref 26.0–34.0)
MCHC: 33 g/dL (ref 30.0–36.0)
MCHC: 34.2 g/dL (ref 30.0–36.0)
MCV: 93.9 fL (ref 78.0–100.0)
MCV: 94.2 fL (ref 78.0–100.0)
Platelets: 188 10*3/uL (ref 150–400)
Platelets: 199 10*3/uL (ref 150–400)
RBC: 4.17 MIL/uL — ABNORMAL LOW (ref 4.22–5.81)
RBC: 4.53 MIL/uL (ref 4.22–5.81)
RDW: 14.8 % (ref 11.5–15.5)
RDW: 14.9 % (ref 11.5–15.5)
WBC: 14 10*3/uL — ABNORMAL HIGH (ref 4.0–10.5)
WBC: 9.1 10*3/uL (ref 4.0–10.5)

## 2010-11-15 LAB — CK TOTAL AND CKMB (NOT AT ARMC)
CK, MB: 1.1 ng/mL (ref 0.3–4.0)
Relative Index: INVALID (ref 0.0–2.5)
Total CK: 75 U/L (ref 7–232)

## 2010-11-15 LAB — POCT I-STAT, CHEM 8
BUN: 12 mg/dL (ref 6–23)
Calcium, Ion: 1.13 mmol/L (ref 1.12–1.32)
Chloride: 107 mEq/L (ref 96–112)
Creatinine, Ser: 1 mg/dL (ref 0.4–1.5)
Glucose, Bld: 135 mg/dL — ABNORMAL HIGH (ref 70–99)
HCT: 45 % (ref 39.0–52.0)
Hemoglobin: 15.3 g/dL (ref 13.0–17.0)
Potassium: 3.7 mEq/L (ref 3.5–5.1)
Sodium: 140 mEq/L (ref 135–145)
TCO2: 23 mmol/L (ref 0–100)

## 2010-11-15 LAB — PROTIME-INR
INR: 0.97 (ref 0.00–1.49)
Prothrombin Time: 12.8 seconds (ref 11.6–15.2)

## 2010-11-15 LAB — CARDIAC PANEL(CRET KIN+CKTOT+MB+TROPI)
CK, MB: 1.1 ng/mL (ref 0.3–4.0)
Relative Index: INVALID (ref 0.0–2.5)
Total CK: 70 U/L (ref 7–232)
Troponin I: <0.01 ng/mL (ref 0.00–0.06)

## 2010-11-15 LAB — LIPID PANEL
Cholesterol: 151 mg/dL (ref 0–200)
HDL: 30 mg/dL — ABNORMAL LOW (ref >39)
LDL Cholesterol: 102 mg/dL — ABNORMAL HIGH (ref 0–99)
Total CHOL/HDL Ratio: 5 RATIO
Triglycerides: 97 mg/dL (ref <150)
VLDL: 19 mg/dL (ref 0–40)

## 2010-11-15 LAB — TROPONIN I: Troponin I: 0.01 ng/mL (ref 0.00–0.06)

## 2010-12-13 ENCOUNTER — Other Ambulatory Visit: Payer: Self-pay | Admitting: Cardiovascular Disease

## 2010-12-15 ENCOUNTER — Other Ambulatory Visit: Payer: Self-pay | Admitting: Emergency Medicine

## 2011-01-01 ENCOUNTER — Ambulatory Visit (INDEPENDENT_AMBULATORY_CARE_PROVIDER_SITE_OTHER): Payer: 59 | Admitting: Family Medicine

## 2011-01-01 ENCOUNTER — Ambulatory Visit (HOSPITAL_COMMUNITY): Admission: RE | Admit: 2011-01-01 | Payer: 59 | Source: Ambulatory Visit

## 2011-01-01 ENCOUNTER — Encounter: Payer: Self-pay | Admitting: Family Medicine

## 2011-01-01 ENCOUNTER — Ambulatory Visit (INDEPENDENT_AMBULATORY_CARE_PROVIDER_SITE_OTHER)
Admission: RE | Admit: 2011-01-01 | Discharge: 2011-01-01 | Disposition: A | Discharge status: Home / Self Care | Payer: 59 | Source: Ambulatory Visit | Attending: Family Medicine | Admitting: Family Medicine

## 2011-01-01 VITALS — BP 110/60 | HR 104 | Temp 98.4°F | Ht 65.0 in | Wt 155.4 lb

## 2011-01-01 DIAGNOSIS — M25561 Pain in right knee: Secondary | ICD-10-CM

## 2011-01-01 DIAGNOSIS — M2391 Unspecified internal derangement of right knee: Secondary | ICD-10-CM

## 2011-01-01 DIAGNOSIS — F17200 Nicotine dependence, unspecified, uncomplicated: Secondary | ICD-10-CM

## 2011-01-01 DIAGNOSIS — D1730 Benign lipomatous neoplasm of skin and subcutaneous tissue of unspecified sites: Secondary | ICD-10-CM

## 2011-01-01 DIAGNOSIS — D1739 Benign lipomatous neoplasm of skin and subcutaneous tissue of other sites: Secondary | ICD-10-CM

## 2011-01-01 DIAGNOSIS — M25569 Pain in unspecified knee: Secondary | ICD-10-CM

## 2011-01-01 DIAGNOSIS — M2390 Unspecified internal derangement of unspecified knee: Secondary | ICD-10-CM

## 2011-01-01 MED ORDER — VARENICLINE TARTRATE 1 MG PO TABS: 1.0000 mg | ORAL_TABLET | Freq: Two times a day (BID) | ORAL | Status: AC

## 2011-01-01 MED ORDER — VARENICLINE TARTRATE 0.5 MG X 11 & 1 MG X 42 PO MISC: ORAL | Status: AC

## 2011-01-01 NOTE — Progress Notes (Signed)
50 year old male, primarily knee pain but also several other c/o:  Patient presents with 2 week h/o R sided knee pain after unclear mechanism, but audible sound was heard. The patient has had an effusion. + symptomatic giving-way. + mechanical clicking. Joint has not locked up. Patient has been able to walk but is limping. The patient does have pain going up and down stairs or rising from a seated position.   Pain location: diffuse Current physical activity: highly active Prior Knee Surgery: none Current pain meds: Tylenol, NSAIDS Bracing: none Occupation or school level: [employer], highly active job, lifts and carries 50 pounds routinely  Lipoma, wife would like examined  Tobacco: continues to smoke a pack a day, down from 2. Approx 70 pack year history  Patient Active Problem List  Diagnoses  . HYPERLIPIDEMIA  . ERECTILE DYSFUNCTION  . SMOKER  . GERD  . FATIGUE  . DISTURBANCE OF SKIN SENSATION  . Knee pain, right  . Internal derangement of right knee  . Lipoma of skin and subcutaneous tissue   Past Medical History  Diagnosis Date  . OA (osteoarthritis)   . Current smoker   . GERD (gastroesophageal reflux disease)   . Hyperlipidemia    Past Surgical History  Procedure Date  . Back surgery   . Neck fusion   . Nasal sinus surgery    History  Substance Use Topics  . Smoking status: Current Everyday Smoker -- 1.0 packs/day for 70 years    Types: Cigarettes  . Smokeless tobacco: Not on file  . Alcohol Use: No   Family History  Problem Relation Age of Onset  . Arthritis Father   . Coronary artery disease Father   . Coronary artery disease Brother 37   No Known Allergies Current Outpatient Prescriptions on File Prior to Visit  Medication Sig Dispense Refill  . simvastatin (ZOCOR) 20 MG tablet TAKE ONE TABLET BY MOUTH NIGHTLY AT BEDTIME  30 tablet  5     GEN: Well-developed,well-nourished,in no acute distress; alert,appropriate and cooperative throughout  examination HEENT: Normocephalic and atraumatic without obvious abnormalities. Ears, externally no deformities PULM: Breathing comfortably in no respiratory distress EXT: No clubbing, cyanosis, or edema PSYCH: Normally interactive. Cooperative during the interview. Pleasant. Friendly and conversant. Not anxious or depressed appearing. Normal, full affect. Skin: Freely movable pliable area in the subcutaneous tissue on the LEFT lateral chest, not adherent to the chest wall  Gait: Normal heel toe pattern, mild limp ROM: 0-115 Effusion: moderate Echymosis or edema: none Patellar tendon NT Painful PLICA: neg Patellar grind: negative Medial and lateral patellar facet loading: negative medial and lateral joint lines: both TTP Mcmurray's POS Flexion-pinch POS BOUNCE HOME POS Varus and valgus stress: stable Lachman: neg Ant and Post drawer: neg Hip abduction, IR, ER: WNL Hip flexion str: 5/5 Hip abd: 5/5 Quad: 5/5 VMO atrophy:No Hamstring concentric and eccentric: 5/5  A/P: 1. Knee pain, internal derangement R knee:  X-rays: AP Bilateral Weight-bearing, Weightbearing Lateral, Sunrise views Indication: knee pain Findings: Minimal OA changes, no fracture or dislocation  Given injury mechanism, abrupt onset of knee pain and abrupt effusion. Stable ligaments. Clinical concern for meniscal pathology. We discussed very as treatment options including conservative versus more aggressive care. Given this patient's highly active status and dependence on physical activity for work and ability to generate revenue, he would like to proceed more aggressively, which I think is appropriate.  Consult Dr. Dion Saucier or Thurston Hole for opinion in the definitive management.  2. Lipoma, LEFT  lateral chest wall. I reassured the patient and his wife. For now, I would follow this.  3. Tobacco: The patient does smoke cigarettes, and we discussed that tobacco is harmful to one's overall health, and I made the  recommendation to quit tobacco products. Start Chantix

## 2011-01-01 NOTE — Patient Instructions (Signed)
REFERRAL: GO THE THE FRONT ROOM AT THE ENTRANCE OF OUR CLINIC, NEAR CHECK IN. ASK FOR MARION. SHE WILL HELP YOU SET UP YOUR REFERRAL. DATE: TIME:  

## 2011-01-02 ENCOUNTER — Encounter: Payer: Self-pay | Admitting: Family Medicine

## 2011-01-02 DIAGNOSIS — D1730 Benign lipomatous neoplasm of skin and subcutaneous tissue of unspecified sites: Secondary | ICD-10-CM | POA: Insufficient documentation

## 2011-01-05 ENCOUNTER — Other Ambulatory Visit (HOSPITAL_COMMUNITY): Payer: Self-pay | Admitting: Orthopedic Surgery

## 2011-01-05 DIAGNOSIS — M25561 Pain in right knee: Secondary | ICD-10-CM

## 2011-01-08 ENCOUNTER — Ambulatory Visit (HOSPITAL_COMMUNITY)
Admission: RE | Admit: 2011-01-08 | Discharge: 2011-01-08 | Disposition: A | Discharge status: Home / Self Care | Payer: 59 | Source: Ambulatory Visit | Attending: Orthopedic Surgery | Admitting: Orthopedic Surgery

## 2011-01-08 DIAGNOSIS — M7120 Synovial cyst of popliteal space [Baker], unspecified knee: Secondary | ICD-10-CM | POA: Insufficient documentation

## 2011-01-08 DIAGNOSIS — M25469 Effusion, unspecified knee: Secondary | ICD-10-CM | POA: Insufficient documentation

## 2011-01-08 DIAGNOSIS — M25569 Pain in unspecified knee: Secondary | ICD-10-CM | POA: Insufficient documentation

## 2011-01-08 DIAGNOSIS — X58XXXA Exposure to other specified factors, initial encounter: Secondary | ICD-10-CM | POA: Insufficient documentation

## 2011-01-08 DIAGNOSIS — M25561 Pain in right knee: Secondary | ICD-10-CM

## 2011-01-08 DIAGNOSIS — IMO0002 Reserved for concepts with insufficient information to code with codable children: Secondary | ICD-10-CM | POA: Insufficient documentation

## 2011-01-14 ENCOUNTER — Telehealth: Payer: Self-pay | Admitting: *Deleted

## 2011-01-14 MED ORDER — ONDANSETRON HCL 4 MG PO TABS: 4.0000 mg | ORAL_TABLET | Freq: Three times a day (TID) | ORAL | Status: AC | PRN

## 2011-01-14 NOTE — Telephone Encounter (Signed)
Patient is on his second week of chantix and wife says that he is very nauseated, feels dizzy, and is vomiting and feels that it is because of the medication. He is going to stop the chantix. Wife wanted you to be aware. She is also asking what he could take for the dizziness and nausea because he was unable to go to work today. Uses [facility] on [address]

## 2011-01-14 NOTE — Telephone Encounter (Signed)
Mitzi notified by message on voice mail

## 2011-01-14 NOTE — Telephone Encounter (Signed)
It very well could be the chantix. That is a common side effect.   I would not add anything else to the mix -- stop chantix.   For nausea, can call in Zofran 4 mg ODT, 1 SL q 6 hours prn nausea, #20 Few non-sedating nausea meds

## 2011-01-16 NOTE — Op Note (Signed)
NAME:  Isaiah Rangel, Isaiah Rangel                         ACCOUNT NO.:  [id number]   MEDICAL RECORD NO.:  [id number]                   PATIENT TYPE:  INP   LOCATION:  3007                                 FACILITY:  [facility]   PHYSICIAN:  Clydene Fake, M.D.               DATE OF BIRTH:  1961-04-10   DATE OF PROCEDURE:  07/25/2002  DATE OF DISCHARGE:                                 OPERATIVE REPORT   DIAGNOSES:  Cervical spondylosis and stenosis.   POSTOPERATIVE DIAGNOSES:  Cervical spondylosis and stenosis.   PROCEDURE:  Intercervical compression, diskectomy and fusion at C5-6 and C6-  7 with allograft and anterior cervical plate (tether plate).   SURGEON:  Clydene Fake, M.D.   ASSISTANT:  Hewitt Shorts, M.D.   ANESTHESIA:  General endotracheal anesthesia.   ESTIMATED BLOOD LOSS:  Minimal.   BLOOD GIVEN:  None.   DRAINS:  None.   COMPLICATIONS:  None.   REASON FOR PROCEDURE:  The patient is a 50 year old gentleman who has had  neck and right arm pain and numbness.  The symptoms have been going on and  off for a few months, but over the last few months, the pain has been more  severe.  He does have some intrinsic weakness on the right and some atrophy  in the intrinsic muscles.  Some of this may be chronic.  His neurologic  exam, otherwise, was intact.  MRI shows canal stenosis, spondylosis of 3-4,  4-5, 5-6, 6-7, worse at the lower two levels, with right foraminal narrowing  and probable conversion of these roots coming out.  The patient is brought  in for two-level decompression at 5-6 and 6-7.   PROCEDURE IN DETAIL:  The patient was brought in the operating room, and  general anesthesia was induced.  The patient was placed in halter traction  of ten pounds and prepped and draped in a sterile fashion.  The second  incision was injected with 10 cc of 1% lidocaine with epinephrine.  An  incision was made on the left side of the neck from the midline to the  anterior border  of the sternocleidomastoid muscle.  The incision was taken  down to the platysma, and hemostasis was obtained with Bovie cauterization.  The platysma was incised with a Bovie, and blunt dissection was taken  through the anterior cervical fascia to the anterior cervical spine.  A  needle was placed in the interspace.  This was the 4-5 interspace and  dissected down inferiorly and placed the needle in what was thought to be 5-  6.  We took another x-ray, and this confirmed the positioning. The 5-6 and 6-  7 disk spaces were incised with a 15 blade, and a partial diskectomy was  then performed with pituitary rongeurs.  The longus colli muscle was then  reflected laterally and inside using the Bovie, and a self-retaining  retractor system  was then placed.  Distraction pins were placed in the C5  and C7, and the interspaces were distracted.  A high-speed drill was then  used to remove osteophytes and to drill down through the disk space.  This  was done both at 5-6 and 6-7.  The posterior longitudinal ligament was  removed with 1-2 mm Kerrison punches, and in this manner, we decompressed  the central canal, and bilateral foraminotomies were performed.  This was  done first at 5-6 and then 6-7.  With the decompression, we did bilateral  foraminotomies, and this showed that the right side was fully decompressed.  This was the more symptomatic side and using a nerve hook, we went out over  the roots on each side.  The wound was irrigated with antibiotic solution.  The depth of the intervertebral body was measured with a depth gauge, and we  sized the interspace.  The 5-6 space was trialed at 6 mm and the 6-7 at 5  mm.  After decompression at both levels, we did use the microscope for  microdissection for further decompression and foraminotomies.  We then  tapped a 6 mm length of bone into the 5-6 interspace and a 5 mm length of  bone into the 6-7 interspace.  We made sure that the depth of the bone  was  shorter than the depth of the vertebral arteries, and this was without any  changes made to the bone.  The bone was countersunk about a mm or two in  each level.  We could easily place a nerve hook.  The wound was irrigated  with antibiotic solution.  Hemostasis was obtained with Gelfoam and  thrombin.  A tethered anterior cervical plate was placed over the anterior  cervical spine with two screws placed in the C5, one in the C6, and two in  the C7.  They were tightened, and a lateral x-ray was obtained, showing good  position of the plate, screws, and bone and good restoration of disk height  at 5-6 and 6-7.  The screws in 7 were just barely seen.  The rest were seen  well.  The wound was irrigated with antibiotic solution.  Gelfoam and  thrombin were placed.  The retractors were removed and left there for a few  minutes.  We then irrigated the Gelfoam out.  We had good hemostasis.  Electrocauterization was used to get further hemostasis.  The wound was  irrigated with antibiotic solution, and the irrigation came back clear.  The  platysma was then closed with 3-0 Vicryl interrupted suture.  The  subcutaneous tissue was closed with 3-0 Vicryl interrupted suture and the  skin closed with Benzoin and Steri-Strips.  Dressing was placed.  The  patient was placed in a soft cervical collar.  He was awakened from  anesthesia and transferred to the recovery room in stable condition.                                               Clydene Fake, M.D.    JRH/MEDQ  D:  07/25/2002  T:  07/25/2002  Job:  [id number]

## 2011-03-16 ENCOUNTER — Encounter: Payer: Self-pay | Admitting: Cardiovascular Disease

## 2011-04-06 ENCOUNTER — Ambulatory Visit (INDEPENDENT_AMBULATORY_CARE_PROVIDER_SITE_OTHER): Payer: 59 | Admitting: Family Medicine

## 2011-04-06 ENCOUNTER — Encounter: Payer: Self-pay | Admitting: Family Medicine

## 2011-04-06 ENCOUNTER — Ambulatory Visit (INDEPENDENT_AMBULATORY_CARE_PROVIDER_SITE_OTHER)
Admission: RE | Admit: 2011-04-06 | Discharge: 2011-04-06 | Disposition: A | Discharge status: Home / Self Care | Payer: 59 | Source: Ambulatory Visit | Attending: Family Medicine | Admitting: Family Medicine

## 2011-04-06 DIAGNOSIS — R5381 Other malaise: Secondary | ICD-10-CM

## 2011-04-06 DIAGNOSIS — Z720 Tobacco use: Secondary | ICD-10-CM

## 2011-04-06 DIAGNOSIS — R634 Abnormal weight loss: Secondary | ICD-10-CM

## 2011-04-06 DIAGNOSIS — Z1211 Encounter for screening for malignant neoplasm of colon: Secondary | ICD-10-CM

## 2011-04-06 DIAGNOSIS — R229 Localized swelling, mass and lump, unspecified: Secondary | ICD-10-CM

## 2011-04-06 DIAGNOSIS — R5383 Other fatigue: Secondary | ICD-10-CM

## 2011-04-06 DIAGNOSIS — N289 Disorder of kidney and ureter, unspecified: Secondary | ICD-10-CM

## 2011-04-06 DIAGNOSIS — F17200 Nicotine dependence, unspecified, uncomplicated: Secondary | ICD-10-CM

## 2011-04-06 DIAGNOSIS — Z125 Encounter for screening for malignant neoplasm of prostate: Secondary | ICD-10-CM

## 2011-04-06 LAB — BASIC METABOLIC PANEL
BUN: 13 mg/dL (ref 6–23)
CO2: 27 mEq/L (ref 19–32)
Calcium: 9.3 mg/dL (ref 8.4–10.5)
Chloride: 102 mEq/L (ref 96–112)
Creatinine, Ser: 0.9 mg/dL (ref 0.4–1.5)
GFR: 91.04 mL/min (ref >60.00)
Glucose, Bld: 126 mg/dL — ABNORMAL HIGH (ref 70–99)
Potassium: 4 mEq/L (ref 3.5–5.1)
Sodium: 138 mEq/L (ref 135–145)

## 2011-04-06 LAB — CBC WITH DIFFERENTIAL/PLATELET
Basophils Absolute: 0.1 10*3/uL (ref 0.0–0.1)
Basophils Relative: 0.6 % (ref 0.0–3.0)
Eosinophils Absolute: 0.3 10*3/uL (ref 0.0–0.7)
Eosinophils Relative: 2.9 % (ref 0.0–5.0)
HCT: 40.4 % (ref 39.0–52.0)
Hemoglobin: 13.5 g/dL (ref 13.0–17.0)
Lymphocytes Relative: 28 % (ref 12.0–46.0)
Lymphs Abs: 2.6 10*3/uL (ref 0.7–4.0)
MCHC: 33.4 g/dL (ref 30.0–36.0)
MCV: 93.2 fl (ref 78.0–100.0)
Monocytes Absolute: 0.5 10*3/uL (ref 0.1–1.0)
Monocytes Relative: 5.8 % (ref 3.0–12.0)
Neutro Abs: 5.9 10*3/uL (ref 1.4–7.7)
Neutrophils Relative %: 62.7 % (ref 43.0–77.0)
Platelets: 235 10*3/uL (ref 150.0–400.0)
RBC: 4.33 Mil/uL (ref 4.22–5.81)
RDW: 14.8 % — ABNORMAL HIGH (ref 11.5–14.6)
WBC: 9.4 10*3/uL (ref 4.5–10.5)

## 2011-04-06 LAB — HEPATIC FUNCTION PANEL
ALT: 23 U/L (ref 0–53)
AST: 23 U/L (ref 0–37)
Albumin: 4.3 g/dL (ref 3.5–5.2)
Alkaline Phosphatase: 75 U/L (ref 39–117)
Bilirubin, Direct: 0.1 mg/dL (ref 0.0–0.3)
Total Bilirubin: 0.3 mg/dL (ref 0.3–1.2)
Total Protein: 6.7 g/dL (ref 6.0–8.3)

## 2011-04-06 LAB — PSA: PSA: 0.76 ng/mL (ref 0.10–4.00)

## 2011-04-06 NOTE — Progress Notes (Addendum)
Subjective:    Patient ID: Isaiah Rangel, male    DOB: [birth year], 50 y.o.   MRN: [id number]  HPI  ? Nodules on lower ribs / soft tissue areas: New that have shown up in the last 2 months  Weight loss, 20 pound weight loss in the last few months  Very heavy smoker. Some SOB with exercise. Does not feel depressed  Generalized feeling of fatigue  Renal lesion seen on CT / MR of abdomen last year, needs f/u exam  F Hx: No leukemia or lymphoma Breast cancer strong PGF - cancer of some type.   The PMH, PSH, Social History, Family History, Medications, and allergies have been reviewed in [facility], and have been updated if relevant.  Review of Systems Weight loss. No fever, chills, sweats. Occ SOB with exercise. No CP. + fatigue     Objective:   Physical Exam   Physical Exam  Blood pressure 90/60, pulse 67, temperature 97.8 F (36.6 C), temperature source Oral, height 5\' 5"  (1.651 m), weight 147 lb (66.679 kg), SpO2 96.00%.  GEN: WDWN, NAD, Non-toxic, A & O x 3 HEENT: Atraumatic, Normocephalic. Neck supple. No masses, No LAD. Ears and Nose: No external deformity. CV: RRR, No M/G/R. No JVD. No thrill. No extra heart sounds. PULM: CTA B, no wheezes, crackles, rhonchi. No retractions. No resp. distress. No accessory muscle use. ABD: S, NT, ND, +BS. No rebound tenderness. No HSM.  EXTR: No c/c/e NEURO Normal gait.  PSYCH: Normally interactive. Conversant. Not depressed or anxious appearing.  Calm demeanor.      Assessment & Plan:   1. Skin mass : could relate to other diagnosis vs. Other findings not related. Cyst vs. Lipoma possible   2. FATIGUE : multiple etiologies possible. Working in heat, post-op from knee or potential significant problem. CBC with Differential, Basic metabolic panel, Hepatic function panel  3. Weight loss, non-intentional : labs and screening for age. Chest x-ray given heavy smoker. MR Abdomen W Wo Contrast, DG Chest 2 View, CBC with Differential, Basic  metabolic panel, Hepatic function panel  4. Renal lesion : recheck renal lesion based on last year's findings. MR Abdomen W Wo Contrast  5. Tobacco abuse  DG Chest 2 View  6. Special screening for malignant neoplasm of prostate  PSA  7. Special screening for malignant neoplasm of colon  Ambulatory referral to Gastroenterology   Addendum: At the time of addendum, all labs and studies back: normal and reassuring. 20 pound weight loss -- patient is concerned about the cysts / lipomas / growths around chest and abdomen. Would like surgical consult to consider biopsy vs. Removal.  CBC:    Component Value Date/Time   WBC 9.4 04/06/2011 0843   HGB 13.5 04/06/2011 0843   HCT 40.4 04/06/2011 0843   PLT 235.0 04/06/2011 0843   MCV 93.2 04/06/2011 0843   NEUTROABS 5.9 04/06/2011 0843   LYMPHSABS 2.6 04/06/2011 0843   MONOABS 0.5 04/06/2011 0843   EOSABS 0.3 04/06/2011 0843   BASOSABS 0.1 04/06/2011 0843   Comprehensive Metabolic Panel:    Component Value Date/Time   NA 138 04/06/2011 0843   K 4.0 04/06/2011 0843   CL 102 04/06/2011 0843   CO2 27 04/06/2011 0843   BUN 13 04/06/2011 0843   CREATININE 0.9 04/06/2011 0843   GLUCOSE 126* 04/06/2011 0843   CALCIUM 9.3 04/06/2011 0843   AST 23 04/06/2011 0843   ALT 23 04/06/2011 0843   ALKPHOS 75 04/06/2011 0843   BILITOT  0.3 04/06/2011 0843   PROT 6.7 04/06/2011 0843   ALBUMIN 4.3 04/06/2011 0843   Lab Results  Component Value Date   PSA 0.76 04/06/2011   PSA 1.06 03/20/2010   PSA 1.06 09/20/2008   CXR, WNL

## 2011-04-06 NOTE — Patient Instructions (Signed)
REFERRAL: GO THE THE FRONT ROOM AT THE ENTRANCE OF OUR CLINIC, NEAR CHECK IN. ASK FOR Isaiah Rangel. SHE WILL HELP YOU SET UP YOUR REFERRAL. DATE: TIME:  

## 2011-04-11 ENCOUNTER — Ambulatory Visit
Admission: RE | Admit: 2011-04-11 | Discharge: 2011-04-11 | Disposition: A | Discharge status: Home / Self Care | Payer: 59 | Source: Ambulatory Visit | Attending: Family Medicine | Admitting: Family Medicine

## 2011-04-11 DIAGNOSIS — N289 Disorder of kidney and ureter, unspecified: Secondary | ICD-10-CM

## 2011-04-11 DIAGNOSIS — R634 Abnormal weight loss: Secondary | ICD-10-CM

## 2011-04-11 MED ORDER — GADOBENATE DIMEGLUMINE 529 MG/ML IV SOLN: 13.0000 mL | Freq: Once | INTRAVENOUS | Status: AC | PRN

## 2011-04-13 ENCOUNTER — Encounter: Payer: Self-pay | Admitting: Family Medicine

## 2011-04-13 NOTE — Progress Notes (Signed)
Addended by: Hannah Beat on: 04/13/2011 04:47 PM   Modules accepted: Orders

## 2011-05-26 ENCOUNTER — Telehealth: Payer: Self-pay | Admitting: *Deleted

## 2011-05-26 ENCOUNTER — Encounter: Payer: Self-pay | Admitting: Internal Medicine

## 2011-05-26 ENCOUNTER — Encounter: Payer: Self-pay | Admitting: Gastroenterology

## 2011-05-26 DIAGNOSIS — F1210 Cannabis abuse, uncomplicated: Secondary | ICD-10-CM

## 2011-05-26 DIAGNOSIS — F430 Acute stress reaction: Secondary | ICD-10-CM

## 2011-05-26 NOTE — Telephone Encounter (Signed)
Pt states he would like to go through rehab to help him stop smoking pot.  He is worried that he will be drug tested at work and will lose his job.  He doesn't smoke every day, but has increased recently with increased stress.

## 2011-05-27 MED ORDER — DIAZEPAM 2 MG PO TABS: 2.0000 mg | ORAL_TABLET | Freq: Three times a day (TID) | ORAL | Status: AC | PRN

## 2011-05-27 NOTE — Telephone Encounter (Signed)
rx called to pharmacy 

## 2011-05-27 NOTE — Telephone Encounter (Signed)
Heather - see note below

## 2011-05-27 NOTE — Telephone Encounter (Signed)
Call in valium as below -- make sure he knows not to drink or smoke pot with it, which can be really dangerous --- life threatening dangerous. Do not take if has to drive, work on roof, use any machinery

## 2011-05-27 NOTE — Telephone Encounter (Signed)
Patient advised and he is asking if their is anything he can have for anxiety he says both parents are sick dad just had back surgery mom has dementia and his step daughter just moved back in with her 31 month old baby and she is not going by house rules. Please advise.. [facility] uses [facility] [address]

## 2011-05-27 NOTE — Telephone Encounter (Signed)
Call  I am going to ask a few colleagues for their opinion about options. I don't think inpatient rehab is an option with marijuana, but outpatient not in the hospital rehab or counselling is usually what people do. It will take me a few days to get info.

## 2011-05-27 NOTE — Telephone Encounter (Signed)
Patient advised.

## 2011-06-01 NOTE — Telephone Encounter (Signed)
Called and spoke with him. He is doing OK now. We discussed different avenues, NA, [facility], psychology.   Will set him up to see Dr. Laymond Purser, who I think would likely be helpful. A lot of his recent increased use patterns seem like a self-coping / self-medication pattern.

## 2011-06-01 NOTE — Telephone Encounter (Signed)
Addended by: Hannah Beat on: 06/01/2011 02:03 PM   Modules accepted: Orders

## 2011-06-19 ENCOUNTER — Ambulatory Visit (AMBULATORY_SURGERY_CENTER): Payer: 59 | Admitting: *Deleted

## 2011-06-19 VITALS — Ht 65.0 in | Wt 135.0 lb

## 2011-06-19 DIAGNOSIS — Z1211 Encounter for screening for malignant neoplasm of colon: Secondary | ICD-10-CM

## 2011-06-19 MED ORDER — PEG-KCL-NACL-NASULF-NA ASC-C 100 G PO SOLR: ORAL | Status: DC

## 2011-06-22 ENCOUNTER — Encounter: Payer: Self-pay | Admitting: Gastroenterology

## 2011-07-01 ENCOUNTER — Other Ambulatory Visit: Payer: Self-pay | Admitting: Cardiovascular Disease

## 2011-07-02 ENCOUNTER — Telehealth: Payer: Self-pay

## 2011-07-02 NOTE — Telephone Encounter (Signed)
Called and discussed with wife and listened to her. Patient having difficulty now. I asked them to schedule a follow-up with me early next week and have Mr. Byard call himself to make the appointment.

## 2011-07-02 NOTE — Telephone Encounter (Signed)
Pt saw Dr Patsy Lager and received Valium 2 mg twice a day for anxiety. Pt has smoked marijuana for several years and has stopped marijuana. Since stopped marijuana not acting himself. Pt is verbally abusive and pt cannot be reasoned with or calmed down. Trying family counseling but that is not going well. Pt is not taking Valium until he gets upset. Pt had appt with psychiatrist or psychologist but pt cancelled.Pts wife is concerned because pt needs to take med every day. Pt's wife seeking help with what to do? Pt does not know wife is calling. Mitzi can be reached at [phone].

## 2011-07-03 ENCOUNTER — Ambulatory Visit (AMBULATORY_SURGERY_CENTER): Payer: 59 | Admitting: Gastroenterology

## 2011-07-03 ENCOUNTER — Telehealth: Payer: Self-pay

## 2011-07-03 ENCOUNTER — Encounter: Payer: Self-pay | Admitting: Gastroenterology

## 2011-07-03 DIAGNOSIS — D129 Benign neoplasm of anus and anal canal: Secondary | ICD-10-CM

## 2011-07-03 DIAGNOSIS — D128 Benign neoplasm of rectum: Secondary | ICD-10-CM

## 2011-07-03 DIAGNOSIS — Z1211 Encounter for screening for malignant neoplasm of colon: Secondary | ICD-10-CM

## 2011-07-03 DIAGNOSIS — D126 Benign neoplasm of colon, unspecified: Secondary | ICD-10-CM

## 2011-07-03 DIAGNOSIS — Z800 Family history of malignant neoplasm of digestive organs: Secondary | ICD-10-CM

## 2011-07-03 MED ORDER — SODIUM CHLORIDE 0.9 % IV SOLN: 500.0000 mL | INTRAVENOUS | Status: DC

## 2011-07-03 NOTE — Progress Notes (Signed)
Pt tolerated the colonoscopy very well. maw 

## 2011-07-03 NOTE — Patient Instructions (Signed)
Please refer to the blue and neon green sheets for instructions regarding diet and activity for the rest of today.  HOLD ALL ASPIRIN and ASPIRIN CONTAINING PRODUCTS for 2 WEEKS (07/16/2011). You may resume all other medications as you would normally take them.  You will receive a letter in the mail in about two weeks regarding the results of the biopsies taken today.  Colon Polyps A polyp is extra tissue that grows inside your body. Colon polyps grow in the large intestine. The large intestine, also called the colon, is part of your digestive system. It is a long, hollow tube at the end of your digestive tract where your body makes and stores stool. Most polyps are not dangerous. They are benign. This means they are not cancerous. But over time, some types of polyps can turn into cancer. Polyps that are smaller than a pea are usually not harmful. But larger polyps could someday become or may already be cancerous. To be safe, doctors remove all polyps and test them.  WHO GETS POLYPS? Anyone can get polyps, but certain people are more likely than others. You may have a greater chance of getting polyps if:  You are over 50.   You have had polyps before.   Someone in your family has had polyps.   Someone in your family has had cancer of the large intestine.   Find out if someone in your family has had polyps. You may also be more likely to get polyps if you:   Eat a lot of fatty foods.   Smoke.   Drink alcohol.   Do not exercise.   Eat too much.  SYMPTOMS  Most small polyps do not cause symptoms. People often do not know they have one until their caregiver finds it during a regular checkup or while testing them for something else. Some people do have symptoms like these:  Bleeding from the anus. You might notice blood on your underwear or on toilet paper after you have had a bowel movement.   Constipation or diarrhea that lasts more than a week.   Blood in the stool. Blood can make  stool look black or it can show up as red streaks in the stool.  If you have any of these symptoms, see your caregiver. HOW DOES THE DOCTOR TEST FOR POLYPS? The doctor can use four tests to check for polyps:  Digital rectal exam. The caregiver wears gloves and checks your rectum (the last part of the large intestine) to see if it feels normal. This test would find polyps only in the rectum. Your caregiver may need to do one of the other tests listed below to find polyps higher up in the intestine.   Barium enema. The caregiver puts a liquid called barium into your rectum before taking x-rays of your large intestine. Barium makes your intestine look white in the pictures. Polyps are dark, so they are easy to see.   Sigmoidoscopy. With this test, the caregiver can see inside your large intestine. A thin flexible tube is placed into your rectum. The device is called a sigmoidoscope, which has a light and a tiny video camera in it. The caregiver uses the sigmoidoscope to look at the last third of your large intestine.   Colonoscopy. This test is like sigmoidoscopy, but the caregiver looks at all of the large intestine. It usually requires sedation. This is the most common method for finding and removing polyps.  TREATMENT   The caregiver will remove  the polyp during sigmoidoscopy or colonoscopy. The polyp is then tested for cancer.   If you have had polyps, your caregiver may want you to get tested regularly in the future.  PREVENTION  There is not one sure way to prevent polyps. You might be able to lower your risk of getting them if you:  Eat more fruits and vegetables and less fatty food.   Do not smoke.   Avoid alcohol.   Exercise every day.   Lose weight if you are overweight.   Eating more calcium and folate can also lower your risk of getting polyps. Some foods that are rich in calcium are milk, cheese, and broccoli. Some foods that are rich in folate are chickpeas, kidney beans, and  spinach.   Aspirin might help prevent polyps. Studies are under way.  Document Released: 05/13/2004 Document Revised: 04/29/2011 Document Reviewed: 10/19/2007 [facility] Patient Information 2012 [place], [place].

## 2011-07-06 ENCOUNTER — Telehealth: Payer: Self-pay | Admitting: *Deleted

## 2011-07-06 ENCOUNTER — Telehealth: Payer: Self-pay

## 2011-07-06 MED ORDER — OMEPRAZOLE 20 MG PO CPDR: 20.0000 mg | DELAYED_RELEASE_CAPSULE | Freq: Every day | ORAL | Status: DC

## 2011-07-06 NOTE — Telephone Encounter (Signed)
Refill sent for omeprazole 20 mg one tablet daily.

## 2011-07-06 NOTE — Telephone Encounter (Signed)
Follow up Call- Patient questions:  Do you have a fever, pain , or abdominal swelling? no Pain Score  0 *  Have you tolerated food without any problems? yes  Have you been able to return to your normal activities? yes  Do you have any questions about your discharge instructions: Diet   no Medications  no Follow up visit  no  Do you have questions or concerns about your Care? yes  Actions: * If pain score is 4 or above: No action needed, pain <4. Pt still having some bleeding according to wife, she was unsure as to exactly how much. Pt stated it was better today but she was concerned about how much was normal. Informed that if it is more than just a smear on toilet paper we would like to hear from him. If it is smears and continues through end of week, we would like to hear from him.

## 2011-07-06 NOTE — Telephone Encounter (Signed)
Duplicate message. 

## 2011-07-07 ENCOUNTER — Telehealth: Payer: Self-pay

## 2011-07-07 ENCOUNTER — Encounter: Payer: Self-pay | Admitting: Gastroenterology

## 2011-07-07 MED ORDER — SIMVASTATIN 20 MG PO TABS: 20.0000 mg | ORAL_TABLET | Freq: Every day | ORAL | Status: DC

## 2011-07-07 NOTE — Telephone Encounter (Signed)
Refill sent for simvastatin 20 mg take one tablet.

## 2011-09-09 ENCOUNTER — Ambulatory Visit (INDEPENDENT_AMBULATORY_CARE_PROVIDER_SITE_OTHER): Payer: 59 | Admitting: Cardiovascular Disease

## 2011-09-09 ENCOUNTER — Encounter: Payer: Self-pay | Admitting: Cardiovascular Disease

## 2011-09-09 ENCOUNTER — Ambulatory Visit (INDEPENDENT_AMBULATORY_CARE_PROVIDER_SITE_OTHER): Payer: 59 | Admitting: *Deleted

## 2011-09-09 DIAGNOSIS — E785 Hyperlipidemia, unspecified: Secondary | ICD-10-CM

## 2011-09-09 DIAGNOSIS — F17200 Nicotine dependence, unspecified, uncomplicated: Secondary | ICD-10-CM

## 2011-09-09 DIAGNOSIS — K30 Functional dyspepsia: Secondary | ICD-10-CM

## 2011-09-09 DIAGNOSIS — K3189 Other diseases of stomach and duodenum: Secondary | ICD-10-CM

## 2011-09-09 DIAGNOSIS — Z8249 Family history of ischemic heart disease and other diseases of the circulatory system: Secondary | ICD-10-CM

## 2011-09-09 NOTE — Patient Instructions (Signed)
You are doing well. No medication changes were made.  Please call us if you have new issues that need to be addressed before your next appt.  Your physician wants you to follow-up in: 12 months.  You will receive a reminder letter in the mail two months in advance. If you don't receive a letter, please call our office to schedule the follow-up appointment. 

## 2011-09-09 NOTE — Assessment & Plan Note (Signed)
We will check his cholesterol today. We have suggested we be aggressive as he continues to smoke.

## 2011-09-09 NOTE — Assessment & Plan Note (Signed)
He continues to smoke one pack per day. We have talked about electronic cigarettes. He does not want chantix

## 2011-09-09 NOTE — Progress Notes (Signed)
Patient ID: Isaiah Rangel, male    DOB: Jan 21, 1961, 51 y.o.   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 Dallas Schimke for further Evaluation. D-dimer was 0.66 and he had a negative CT scan.     a stress echocardiogram  was performed that showed no significant ischemia. He achieved a heart rate greater than 150. Cardiac enzymes were negative during his hospital stay.   He presents today and reports no chest pain.  He denies any chest pain with activity. He did have knee surgery since his last visit. He continues to smoke one pack per day. He has tried Chantix in the past though had severe vertigo. He does also have Raynaud's in his hands    EKG shows normal sinus rhythm with rate 71 beats a minute with no significant ST or T wave changes      Outpatient Encounter Prescriptions as of 09/09/2011  Medication Sig Dispense Refill  . acetaminophen (TYLENOL) 500 MG tablet Take 1,000 mg by mouth every 6 (six) hours as needed.        Marland Kitchen aspirin 81 MG chewable tablet Chew 81 mg by mouth daily.        . diazepam (VALIUM) 2 MG tablet Take 1 tablet by mouth as needed. nerves      . ibuprofen (ADVIL,MOTRIN) 200 MG tablet Take 400 mg by mouth every 6 (six) hours as needed.        . Multiple Vitamin (MULTIVITAMIN) tablet Take 1 tablet by mouth daily.        . Omega-3 Fatty Acids (FISH OIL) 1000 MG CAPS Take 1 capsule by mouth daily.        Marland Kitchen omeprazole (PRILOSEC) 20 MG capsule Take 1 capsule (20 mg total) by mouth daily.  30 capsule  6  . RAPAFLO 8 MG CAPS capsule Take 1 tablet by mouth at bedtime.      . simvastatin (ZOCOR) 20 MG tablet Take 1 tablet (20 mg total) by mouth at bedtime.  30  tablet  5     Review of Systems  Constitutional: Negative.   HENT: Negative.   Eyes: Negative.   Respiratory: Negative.   Cardiovascular: Negative.   Gastrointestinal: Negative.   Musculoskeletal: Negative.   Skin: Negative.   Neurological: Negative.   Hematological: Negative.   Psychiatric/Behavioral: Negative.   All other systems reviewed and are negative.    BP 117/56  Pulse 71  Ht 5' 5.5" (1.664 m)  Wt 147 lb (66.679 kg)  BMI 24.09 kg/m2  Physical Exam  Nursing note and vitals reviewed. Constitutional: He is oriented to person, place, and time. He appears well-developed and well-nourished.  HENT:  Head: Normocephalic.  Nose: Nose normal.  Mouth/Throat: Oropharynx is clear and moist.  Eyes: Conjunctivae are normal. Pupils are equal, round, and reactive to light.  Neck: Normal range of motion. Neck supple. No JVD present.  Cardiovascular: Normal rate, regular rhythm, S1 normal, S2 normal, normal heart sounds and intact distal pulses.  Exam reveals no gallop and no friction rub.   No murmur heard. Pulmonary/Chest: Effort normal. No respiratory distress. He has decreased breath sounds. He has no wheezes. He has no rales. He exhibits  no tenderness.  Abdominal: Soft. Bowel sounds are normal. He exhibits no distension. There is no tenderness.  Musculoskeletal: Normal range of motion. He exhibits no edema and no tenderness.  Lymphadenopathy:    He has no cervical adenopathy.  Neurological: He is alert and oriented to person, place, and time. Coordination normal.  Skin: Skin is warm and dry. No rash noted. No erythema.  Psychiatric: He has a normal mood and affect. His behavior is normal. Judgment and thought content normal.           Assessment and Plan

## 2011-09-10 LAB — HEPATIC FUNCTION PANEL
ALT: 17 IU/L (ref 0–44)
AST: 16 IU/L (ref 0–40)
Albumin: 4.5 g/dL (ref 3.5–5.5)
Alkaline Phosphatase: 82 IU/L (ref 25–150)
Bilirubin, Direct: 0.12 mg/dL (ref 0.00–0.40)
Total Bilirubin: 0.4 mg/dL (ref 0.0–1.2)
Total Protein: 6.8 g/dL (ref 6.0–8.5)

## 2011-09-10 LAB — LIPID PANEL
Chol/HDL Ratio: 3.9 ratio units (ref 0.0–5.0)
Cholesterol, Total: 144 mg/dL (ref 100–199)
HDL: 37 mg/dL — ABNORMAL LOW (ref >39)
LDL Calculated: 92 mg/dL (ref 0–99)
Triglycerides: 77 mg/dL (ref 0–149)
VLDL Cholesterol Cal: 15 mg/dL (ref 5–40)

## 2011-10-13 ENCOUNTER — Telehealth: Payer: Self-pay | Admitting: Family Medicine

## 2011-10-13 MED ORDER — TERAZOSIN HCL 1 MG PO CAPS: 1.0000 mg | ORAL_CAPSULE | Freq: Every day | ORAL | Status: DC

## 2011-10-13 NOTE — Telephone Encounter (Signed)
[place], [place] [address] p. [phone] f. [phone] To: [facility] (Daytime Triage) Fax: [phone] From: [facility] Date/ Time: 10/13/2011 11:09 AM Taken By: Gerri Spore, CSR Caller: Misty Facility: not collected Patient: Isaiah Rangel, Isaiah Rangel DOB: Jan 19, 1961 Phone: [phone] Reason for Call: Can't afford Rapiflow that Dr Artis Flock prescribed and now wants Dr Dallas Schimke to prescribe hytrin. Regarding Appointment: Appt Date: Appt Time: Unknown Provider: Reason: Details: Outcome:

## 2011-10-13 NOTE — Telephone Encounter (Signed)
Ok, d/c rapaflo  Can call /send hytrin 1 mg, 1 po qhs. #30, 5 refills  Update meds

## 2012-03-12 ENCOUNTER — Other Ambulatory Visit: Payer: Self-pay | Admitting: Cardiovascular Disease

## 2012-03-14 ENCOUNTER — Other Ambulatory Visit: Payer: Self-pay | Admitting: *Deleted

## 2012-03-14 MED ORDER — OMEPRAZOLE 20 MG PO CPDR: 20.0000 mg | DELAYED_RELEASE_CAPSULE | Freq: Every day | ORAL | Status: DC

## 2012-03-14 NOTE — Telephone Encounter (Signed)
Refilled Omeprazole 

## 2012-05-25 ENCOUNTER — Encounter: Payer: Self-pay | Admitting: Family Medicine

## 2012-05-25 ENCOUNTER — Ambulatory Visit (INDEPENDENT_AMBULATORY_CARE_PROVIDER_SITE_OTHER): Payer: 59 | Admitting: Family Medicine

## 2012-05-25 VITALS — BP 98/62 | HR 86 | Temp 98.3°F | Ht 65.5 in | Wt 140.5 lb

## 2012-05-25 DIAGNOSIS — R509 Fever, unspecified: Secondary | ICD-10-CM

## 2012-05-25 DIAGNOSIS — M2550 Pain in unspecified joint: Secondary | ICD-10-CM

## 2012-05-25 MED ORDER — DOXYCYCLINE HYCLATE 100 MG PO TABS: 100.0000 mg | ORAL_TABLET | Freq: Two times a day (BID) | ORAL | Status: DC

## 2012-05-25 NOTE — Progress Notes (Signed)
[facility] [address] [place] [address] Phone: [phone] Fax: [phone]  Date:  05/25/2012   Name:  Isaiah Rangel   DOB:  1961/06/22   MRN:  [id number] Gender: male Age: 51 y.o.  PCP:  Hannah Beat, MD    Chief Complaint: Generalized Body Aches   History of Present Illness:  Isaiah Rangel is a 51 y.o. pleasant patient who presents with the following:  No knee energy, got up to 101 this morning. Wife an Charity fundraiser checked. Achy and hurting. He is generally feeling poorly, achy all over in his joints in his muscles. Feels like he has no good energy. Is not having much of a cough, no significant sore throat. No real headache. He generally just feels poorly, has a fever, and has some myalgias and arthralgias.  Patient Active Problem List  Diagnosis  . HYPERLIPIDEMIA  . ERECTILE DYSFUNCTION  . GERD  . FATIGUE  . DISTURBANCE OF SKIN SENSATION  . Lipoma of skin and subcutaneous tissue  . Skin mass  . Weight loss, non-intentional  . Renal lesion  . Smoking    Past Medical History  Diagnosis Date  . OA (osteoarthritis)   . Current smoker   . GERD (gastroesophageal reflux disease)   . Hyperlipidemia   . Anxiety   . BPH (benign prostatic hyperplasia)     Past Surgical History  Procedure Date  . Back surgery   . Neck fusion   . Nasal sinus surgery   . Knee arthroscopy w/ partial medial meniscectomy 2012    Wainer, 2012  . Vasectomy     History  Substance Use Topics  . Smoking status: Current Every Day Smoker -- 1.0 packs/day for 70 years    Types: Cigarettes  . Smokeless tobacco: Never Used  . Alcohol Use: No    Family History  Problem Relation Age of Onset  . Arthritis Father   . Coronary artery disease Father   . Coronary artery disease Brother 80  . Colon cancer Mother   . Esophageal cancer Neg Hx   . Stomach cancer Neg Hx     No Known Allergies  Medication list has been reviewed and updated.  Outpatient  Prescriptions Prior to Visit  Medication Sig Dispense Refill  . acetaminophen (TYLENOL) 500 MG tablet Take 1,000 mg by mouth every 6 (six) hours as needed.        Marland Kitchen aspirin 81 MG chewable tablet Chew 81 mg by mouth daily.        . diazepam (VALIUM) 2 MG tablet Take 1 tablet by mouth as needed. nerves      . ibuprofen (ADVIL,MOTRIN) 200 MG tablet Take 400 mg by mouth every 6 (six) hours as needed.        . Multiple Vitamin (MULTIVITAMIN) tablet Take 1 tablet by mouth daily.        . Omega-3 Fatty Acids (FISH OIL) 1000 MG CAPS Take 1 capsule by mouth daily.        Marland Kitchen omeprazole (PRILOSEC) 20 MG capsule Take 1 capsule (20 mg total) by mouth daily.  30 capsule  6  . simvastatin (ZOCOR) 20 MG tablet Take 1 tablet (20 mg total) by mouth at bedtime.  30 tablet  5  . terazosin (HYTRIN) 1 MG capsule Take 1 capsule (1 mg total) by mouth at bedtime.  30 capsule  5    Review of Systems:  ROS: GEN: Acute illness details above GI: Tolerating PO  intake GU: maintaining adequate hydration and urination Pulm: No SOB Interactive and getting along well at home.  Otherwise, ROS is as per the HPI.   Physical Examination: Filed Vitals:   05/25/12 1127  BP: 98/62  Pulse: 86  Temp: 98.3 F (36.8 C)   Filed Vitals:   05/25/12 1127  Height: 5' 5.5" (1.664 m)  Weight: 140 lb 8 oz (63.73 kg)   Body mass index is 23.02 kg/(m^2). Ideal Body Weight: Weight in (lb) to have BMI = 25: 152.2    Gen: WDWN, NAD; A & O x3, cooperative. Pleasant.Globally Non-toxic HEENT: Normocephalic and atraumatic. Throat clear, w/o exudate, R TM clear, L TM - good landmarks, No fluid present. rhinnorhea. No frontal or maxillary sinus T. MMM NECK: Anterior cervical  LAD is absent CV: RRR, No M/G/R, cap refill <2 sec PULM: Breathing comfortably in no respiratory distress. no wheezing, crackles, rhonchi ABD: S,NT,ND,+BS. No HSM. No rebound. EXT: No c/c/e PSYCH: Friendly, good eye contact MSK: Nml gait   Assessment and  Plan:  1. Fever   2. Polyarthralgia      Flu test is negative in the office.  Probable parainfluenza, cannot exclude tickborne illness in an avid outdoorsman. We will treat him with doxycycline  Orders Today:  No orders of the defined types were placed in this encounter.    Updated Medication List: (Includes new medications, updates to list, dose adjustments) Meds ordered this encounter  Medications  . doxycycline (VIBRA-TABS) 100 MG tablet    Sig: Take 1 tablet (100 mg total) by mouth 2 (two) times daily.    Dispense:  20 tablet    Refill:  0    Medications Discontinued: There are no discontinued medications.   Hannah Beat, MD

## 2012-05-26 ENCOUNTER — Emergency Department: Payer: Self-pay | Admitting: Emergency Medicine

## 2012-05-26 LAB — COMPREHENSIVE METABOLIC PANEL
Albumin: 3.4 g/dL (ref 3.4–5.0)
Alkaline Phosphatase: 103 U/L (ref 50–136)
Anion Gap: 8 (ref 7–16)
BUN: 14 mg/dL (ref 7–18)
Bilirubin,Total: 0.2 mg/dL (ref 0.2–1.0)
Calcium, Total: 8.6 mg/dL (ref 8.5–10.1)
Chloride: 108 mmol/L — ABNORMAL HIGH (ref 98–107)
Co2: 27 mmol/L (ref 21–32)
Creatinine: 0.92 mg/dL (ref 0.60–1.30)
EGFR (African American): >60
EGFR (Non-African Amer.): >60
Glucose: 104 mg/dL — ABNORMAL HIGH (ref 65–99)
Osmolality: 286 (ref 275–301)
Potassium: 3.7 mmol/L (ref 3.5–5.1)
SGOT(AST): 29 U/L (ref 15–37)
SGPT (ALT): 39 U/L (ref 12–78)
Sodium: 143 mmol/L (ref 136–145)
Total Protein: 6.7 g/dL (ref 6.4–8.2)

## 2012-05-26 LAB — CBC
HCT: 40.2 % (ref 40.0–52.0)
HGB: 13.9 g/dL (ref 13.0–18.0)
MCH: 31.4 pg (ref 26.0–34.0)
MCHC: 34.6 g/dL (ref 32.0–36.0)
MCV: 91 fL (ref 80–100)
Platelet: 174 10*3/uL (ref 150–440)
RBC: 4.43 10*6/uL (ref 4.40–5.90)
RDW: 14.8 % — ABNORMAL HIGH (ref 11.5–14.5)
WBC: 12.1 10*3/uL — ABNORMAL HIGH (ref 3.8–10.6)

## 2012-05-26 LAB — LIPASE, BLOOD: Lipase: 163 U/L (ref 73–393)

## 2012-05-27 ENCOUNTER — Telehealth: Payer: Self-pay | Admitting: Family Medicine

## 2012-05-27 NOTE — Telephone Encounter (Signed)
[facility] Triage Call Report Triage Record Num: [id number] Operator: Remonia Richter Patient Name: Isaiah Rangel Call Date & Time: 05/26/2012 9:01:34PM Patient Phone: [phone] PCP: Hannah Beat Patient Gender: Male PCP Fax : [phone] Patient DOB: 1960/09/10 [facility] Name[facility] Reason for Call: Caller: Mitzi/Spouse; PCP: Hannah Beat (Family Practice); CB#: [phone]; Call regarding Nausea, vomiting, He is being treated for lyme disease and he was seen in office 05/25/12 and was started on Doxycycline 100mg  PO BID, tonight he is aching all over with temp 99 ax at 20:00, he has vomited x 3 this PM starting at 19:30, he has had fair fluid intake today and last void was 20:00, wife states vitals are stable and looks pale,He got on line with triage and stated "my gut is hurting 9/10 in upper left corner by my ribs", when questioned he stated the pain started in am and has gotten more intense all day ,ER disposition obtained per Abdominal Pain Guideline due to severe pain,wife agrees to take him to [facility] prefer [facility] for eval Protocol(s) Used: Abdominal Pain Protocol(s) Used: Nausea or Vomiting Recommended Outcome per Protocol: See ED Immediately Reason for Outcome: Abdominal pain is more bothersome than vomiting Unbearable abdominal/pelvic pain Care Advice: ~ IMMEDIATE ACTION 05/26/2012 9:46:57PM Page 1 of 1 [id number]

## 2012-05-27 NOTE — Telephone Encounter (Signed)
That is reasonable and very different picture than in office.   Not treated for lyme. For clarification, more likely parainfluenza or similar virus. With fever, aches, given doxy for potential RMSF or ehrlichiosis

## 2012-05-31 ENCOUNTER — Telehealth: Payer: Self-pay | Admitting: Family Medicine

## 2012-05-31 NOTE — Telephone Encounter (Signed)
[facility] ER record in Dr [name]'s in box.

## 2012-05-31 NOTE — Telephone Encounter (Signed)
Spoke with pts wife; pt seen [facility] ER 05/26/12 for malaise, joint pain and stomach cramping pain level 8-10. CT abdomen showed a lot of stool. ER also drew lab test including lyme disease;pts wife request lab results from [facility]. Now pt still general malaise,achy, non productive cough, no fever since 05/29/12.pt has chest congestion but no wheezing; has hx of COPD. Requested [facility] ER notes including labs, CT etc.

## 2012-05-31 NOTE — Telephone Encounter (Signed)
Will review ER notes.   Hannah Beat, MD 05/31/2012, 4:46 PM

## 2012-05-31 NOTE — Telephone Encounter (Signed)
The pt's wife called the triage line and is hoping to speak with a nurse about Mr.Mcguiness's symptoms.  She is hoping for a call back on [phone] or [phone]   Thanks!

## 2012-06-01 NOTE — Telephone Encounter (Signed)
Please call and let them know his lyme disease test was negative.  I suspect all viral infection that is just really bad. COPD / history of smoking probably making cough sx worse.  I suspect he will need more time for this to resolve

## 2012-06-01 NOTE — Telephone Encounter (Signed)
Patient's wife notified as instructed by telephone. 

## 2012-06-04 ENCOUNTER — Other Ambulatory Visit: Payer: Self-pay | Admitting: Family Medicine

## 2012-10-29 ENCOUNTER — Other Ambulatory Visit: Payer: Self-pay | Admitting: Family Medicine

## 2012-11-01 ENCOUNTER — Other Ambulatory Visit: Payer: Self-pay | Admitting: Family Medicine

## 2012-12-05 ENCOUNTER — Other Ambulatory Visit: Payer: Self-pay

## 2012-12-05 MED ORDER — OMEPRAZOLE 20 MG PO CPDR: 20.0000 mg | DELAYED_RELEASE_CAPSULE | Freq: Every day | ORAL | Status: DC

## 2012-12-05 NOTE — Telephone Encounter (Signed)
Refill sent for omeprazole.  

## 2012-12-08 ENCOUNTER — Other Ambulatory Visit: Payer: Self-pay | Admitting: *Deleted

## 2012-12-08 MED ORDER — OMEPRAZOLE 20 MG PO CPDR: 20.0000 mg | DELAYED_RELEASE_CAPSULE | Freq: Every day | ORAL | Status: DC

## 2012-12-08 NOTE — Telephone Encounter (Signed)
Refilled Omeprazole sent to [facility] #[facility] Refill#5.

## 2012-12-12 ENCOUNTER — Other Ambulatory Visit: Payer: Self-pay | Admitting: *Deleted

## 2012-12-12 MED ORDER — TERAZOSIN HCL 1 MG PO CAPS: ORAL_CAPSULE | ORAL | Status: DC

## 2013-02-04 ENCOUNTER — Encounter: Payer: Self-pay | Admitting: Family Medicine

## 2013-02-04 ENCOUNTER — Ambulatory Visit (INDEPENDENT_AMBULATORY_CARE_PROVIDER_SITE_OTHER): Payer: 59 | Admitting: Family Medicine

## 2013-02-04 VITALS — BP 118/70 | HR 68 | Temp 98.1°F | Wt 138.0 lb

## 2013-02-04 DIAGNOSIS — H8110 Benign paroxysmal vertigo, unspecified ear: Secondary | ICD-10-CM

## 2013-02-04 DIAGNOSIS — R42 Dizziness and giddiness: Secondary | ICD-10-CM

## 2013-02-04 MED ORDER — TERAZOSIN HCL 1 MG PO CAPS: ORAL_CAPSULE | ORAL | Status: DC

## 2013-02-04 MED ORDER — PROMETHAZINE HCL 25 MG/ML IJ SOLN
50.0000 mg | Freq: Once | INTRAMUSCULAR | Status: AC
  Administered 2013-02-04: 50 mg via INTRAMUSCULAR

## 2013-02-04 MED ORDER — MECLIZINE HCL 25 MG PO TABS: 25.0000 mg | ORAL_TABLET | Freq: Three times a day (TID) | ORAL | Status: DC | PRN

## 2013-02-04 NOTE — Patient Instructions (Addendum)
Phenergan injection now  Meclizine px - sent to the pharmacy - can take up to three times per day as needed for dizziness  Try to keep up fluid intake Try to avoid head movement as much as possible until you feel better If symptoms acutely worsen- or you develop new symptoms - please call or go to the ER if necessary  Update if not starting to improve in a week or if worsening

## 2013-02-04 NOTE — Progress Notes (Signed)
Subjective:    Patient ID: Isaiah Rangel, male    DOB: 10/15/1960, 52 y.o.   MRN: [id number]  HPI Here for dizziness  Started yesterday am - 6 am - got out of bed - very very dizzy - difficult to move  Has had this happen once before  Did vomit once during the night  With positional change - feels like the room is spinning or his head is spinning  Unsteady on his feet   Has chronic sinus congestion and has had sinus surgery in the past  R ear -has some wax , also itches a bit  No rash or skin change  No insect bites   No fever or headache   Vitals are good an nl EKG No cp  Patient Active Problem List   Diagnosis Date Noted  . Smoking 09/09/2011  . Skin mass 04/06/2011  . Weight loss, non-intentional 04/06/2011  . Renal lesion 04/06/2011  . Lipoma of skin and subcutaneous tissue 01/02/2011  . HYPERLIPIDEMIA 04/09/2010  . DISTURBANCE OF SKIN SENSATION 03/20/2010  . GERD 09/21/2008  . ERECTILE DYSFUNCTION 09/20/2008  . FATIGUE 09/20/2008   Past Medical History  Diagnosis Date  . OA (osteoarthritis)   . Current smoker   . GERD (gastroesophageal reflux disease)   . Hyperlipidemia   . Anxiety   . BPH (benign prostatic hyperplasia)    Past Surgical History  Procedure Laterality Date  . Back surgery    . Neck fusion    . Nasal sinus surgery    . Knee arthroscopy w/ partial medial meniscectomy  2012    Wainer, 2012  . Vasectomy     History  Substance Use Topics  . Smoking status: Current Every Day Smoker -- 1.00 packs/day for 70 years    Types: Cigarettes  . Smokeless tobacco: Never Used  . Alcohol Use: No   Family History  Problem Relation Age of Onset  . Arthritis Father   . Coronary artery disease Father   . Coronary artery disease Brother 40  . Colon cancer Mother   . Esophageal cancer Neg Hx   . Stomach cancer Neg Hx    No Known Allergies Current Outpatient Prescriptions on File Prior to Visit  Medication Sig Dispense Refill  . acetaminophen  (TYLENOL) 500 MG tablet Take 1,000 mg by mouth every 6 (six) hours as needed.        Marland Kitchen aspirin 81 MG chewable tablet Chew 81 mg by mouth daily.        Marland Kitchen ibuprofen (ADVIL,MOTRIN) 200 MG tablet Take 400 mg by mouth every 6 (six) hours as needed.        . Multiple Vitamin (MULTIVITAMIN) tablet Take 1 tablet by mouth daily.        . Omega-3 Fatty Acids (FISH OIL) 1000 MG CAPS Take 1 capsule by mouth daily.        Marland Kitchen omeprazole (PRILOSEC) 20 MG capsule Take 1 capsule (20 mg total) by mouth daily.  30 capsule  6  . terazosin (HYTRIN) 1 MG capsule TAKE 1 CAPSULE BY MOUTH EVERY NIGHT AT BEDTIME * Needs appointment with Dr for additional refills*  30 capsule  0   No current facility-administered medications on file prior to visit.    Review of Systems Review of Systems  Constitutional: Negative for fever, appetite change, fatigue and unexpected weight change.  Eyes: Negative for pain and visual disturbance.  Respiratory: Negative for cough and shortness of breath.   Cardiovascular: Negative for cp  or palpitations    Gastrointestinal: Negative for nausea, diarrhea and constipation.  Genitourinary: Negative for urgency and frequency.  Skin: Negative for pallor or rash   Neurological: Negative for weakness,  numbness and headaches. neg for MS change or speech problems Hematological: Negative for adenopathy. Does not bruise/bleed easily.  Psychiatric/Behavioral: Negative for dysphoric mood. The patient is not nervous/anxious.         Objective:   Physical Exam  Constitutional: He appears well-developed and well-nourished. No distress.  Lying still on the table - tries not to move head due to dizziness  HENT:  Head: Normocephalic and atraumatic.  Right Ear: External ear normal.  Left Ear: External ear normal.  Mouth/Throat: Oropharynx is clear and moist. No oropharyngeal exudate.  Nares are boggy  Eyes: Conjunctivae and EOM are normal. Pupils are equal, round, and reactive to light. No scleral  icterus.  2-3 beats of horizontal nystagmus bilaterally  Neck: Normal range of motion. Neck supple. No JVD present. Carotid bruit is not present.  Cardiovascular: Normal rate and regular rhythm.   Pulmonary/Chest: Effort normal and breath sounds normal.  Lymphadenopathy:    He has no cervical adenopathy.  Neurological: He is alert. He has normal reflexes. He displays no tremor. No cranial nerve deficit or sensory deficit. He exhibits normal muscle tone. Gait abnormal.  Unsteady gait due to vertigo  Skin: Skin is warm and dry. No rash noted. No erythema. No pallor.  Psychiatric: He has a normal mood and affect.          Assessment & Plan:

## 2013-02-04 NOTE — Assessment & Plan Note (Signed)
Disc course of vertigo and causes Reassuring EKG and exam  Phenergan 50 mg IM now Give px for mecizine 25 mg tid prn Disc keeping head still until feeling better If acutely worse will go to ER-otherwise f/u if not imp next week

## 2013-02-06 ENCOUNTER — Telehealth: Payer: Self-pay

## 2013-02-06 NOTE — Telephone Encounter (Signed)
Triage Record Num: [id number] Operator: Cornell Barman Patient Name: Isaiah Rangel Call Date & Time: 02/03/2013 5:43:45PM Patient Phone: [phone] PCP: Hannah Beat Patient Gender: Male PCP Fax : [phone] Patient DOB: [birth year] [facility] Reason for Call: Caller: Mitiz/Spouse; PCP: Hannah Beat (Family Practice); CB#: [phone]; Call regarding her husband got up at 06:00 this morning dizzy, 02/03/13. He did work today. Wife is a Engineer, civil (consulting) and checked his B/p lying 110/80 sitting- 98/60. He has taken in extra fluids today (32oz) . Even with laying down feels constant dizzy. "Room spinning and he is spinning in opposite direction". Wife states that he is complaining of Right earlobe is itching/auricle and deep inside. Emergent s/sx ruled out per Dizziness Vertigo Protocol with the exception to "symptoms worsen with movement of head or looking up". See provider in 24 hours. Appt scheduled 02/04/13 at 10:15 at [facility] office. Location and time provided to wife. Care advice provided along with call back parameters. Understanding expressed. Protocol(s) Used: Dizziness or Vertigo Recommended Outcome per Protocol: See Provider within 24 hours Reason for Outcome: Symptoms worsen with movement of head or looking up AND not previously evaluated Care Advice: Call EMS 911 if patient develops confusion, decreased level of consciousness, chest pain, shortness of breath, or focal neurologic abnormalities such as facial droop or weakness of one extremity. ~ Avoid caffeine (coffee, tea, cola drinks, or chocolate), alcohol, and nicotine (use of tobacco), as use of these substances may worsen symptoms. ~ ~ SYMPTOM / CONDITION MANAGEMENT A temporary drop in blood pressure sometimes occurs with a quick change to an upright position (postural hypotension) and may cause light-headedness or dizziness. Change position slowly. Making a habit of rising slowly and sitting for  a few minutes before standing to walk usually relieves the feeling of faintness. ~ When feeling faint, find a place to lie down if possible, and elevate legs 8 to 12 inches above the heart. If unable to lie down, sit in a chair and lower head between the knees for 3 to 5 minutes. If standing and not able to sit, cross legs and squeeze the knees together to move blood to the heart. ~

## 2013-02-23 ENCOUNTER — Ambulatory Visit (INDEPENDENT_AMBULATORY_CARE_PROVIDER_SITE_OTHER): Payer: 59 | Admitting: Family Medicine

## 2013-02-23 ENCOUNTER — Encounter: Payer: Self-pay | Admitting: Family Medicine

## 2013-02-23 VITALS — BP 102/64 | HR 87 | Temp 98.2°F | Ht 65.5 in | Wt 146.5 lb

## 2013-02-23 DIAGNOSIS — R5381 Other malaise: Secondary | ICD-10-CM

## 2013-02-23 DIAGNOSIS — Z125 Encounter for screening for malignant neoplasm of prostate: Secondary | ICD-10-CM

## 2013-02-23 DIAGNOSIS — Z13220 Encounter for screening for lipoid disorders: Secondary | ICD-10-CM

## 2013-02-23 DIAGNOSIS — R5383 Other fatigue: Secondary | ICD-10-CM

## 2013-02-23 DIAGNOSIS — Z0000 Encounter for general adult medical examination without abnormal findings: Secondary | ICD-10-CM

## 2013-02-23 MED ORDER — BUPROPION HCL ER (SR) 150 MG PO TB12: 150.0000 mg | ORAL_TABLET | Freq: Two times a day (BID) | ORAL | Status: DC

## 2013-02-23 MED ORDER — TERAZOSIN HCL 1 MG PO CAPS: ORAL_CAPSULE | ORAL | Status: DC

## 2013-02-23 NOTE — Progress Notes (Signed)
[facility] conservation [facility] at [facility] [address] [place] [address] Phone: [phone] Fax: [phone]  Date:  02/23/2013   Name:  Isaiah Rangel   DOB:  [birth year]   MRN:  [id number] Gender: male Age: 52 y.o.  Primary Physician:  Hannah Beat, MD  Evaluating MD: Hannah Beat, MD   Chief Complaint: Annual Exam   History of Present Illness:  Isaiah Rangel is a 52 y.o. pleasant patient who presents with the following:  CPX:  Tdap - reports that it is utd  Working for [employer].  Still smoking.  Ready to quit. None sometimes - sometimes a pack.   Preventative Health Maintenance Visit:  Health Maintenance Summary Reviewed and updated, unless pt declines services.  Tobacco History Reviewed. + smoker Alcohol: No concerns, no excessive use Exercise Habits: Some activity, rec at least 30 mins 5 times a week. Extremely strong and active for age.  STD concerns: no risk or activity to increase risk Drug Use: None Encouraged self-testicular check  Health Maintenance  Topic Date Due  . Tetanus/tdap  04/05/1979  . Influenza Vaccine  05/01/2013  . Colonoscopy  07/02/2016    Labs reviewed with the patient.  Results for orders placed in visit on 09/09/11  LIPID PANEL      Result Value Range   Cholesterol, Total 144  100 - 199 mg/dL   Triglycerides 77  0 - 149 mg/dL   HDL 37 (*) >44 mg/dL   VLDL Cholesterol Cal 15  5 - 40 mg/dL   LDL Calculated 92  0 - 99 mg/dL   Chol/HDL Ratio 3.9  0.0 - 5.0 ratio units  HEPATIC FUNCTION PANEL      Result Value Range   Total Protein 6.8  6.0 - 8.5 g/dL   Albumin 4.5  3.5 - 5.5 g/dL   Total Bilirubin 0.4  0.0 - 1.2 mg/dL   Bilirubin, Direct 0.10  0.00 - 0.40 mg/dL   Alkaline Phosphatase 82  25 - 150 IU/L   AST 16  0 - 40 IU/L   ALT 17  0 - 44 IU/L     Patient Active Problem List   Diagnosis Date Noted  . Benign paroxysmal positional vertigo 02/04/2013  . Smoking 09/09/2011  . Skin mass 04/06/2011  . Renal  lesion 04/06/2011  . Lipoma of skin and subcutaneous tissue 01/02/2011  . HYPERLIPIDEMIA 04/09/2010  . GERD 09/21/2008  . ERECTILE DYSFUNCTION 09/20/2008  . FATIGUE 09/20/2008    Past Medical History  Diagnosis Date  . OA (osteoarthritis)   . Current smoker   . GERD (gastroesophageal reflux disease)   . Hyperlipidemia   . Anxiety   . BPH (benign prostatic hyperplasia)     Past Surgical History  Procedure Laterality Date  . Back surgery    . Neck fusion    . Nasal sinus surgery    . Knee arthroscopy w/ partial medial meniscectomy  2012    Wainer, 2012  . Vasectomy      History   Social History  . Marital Status: Single    Spouse Name: N/A    Number of Children: N/A  . Years of Education: N/A   Occupational History  . [employer]    Social History Main Topics  . Smoking status: Current Every Day Smoker -- 1.00 packs/day for 70 years    Types: Cigarettes  . Smokeless tobacco: Never Used  . Alcohol Use: No  . Drug Use: No  .  Sexually Active: Not on file   Other Topics Concern  . 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 Ree Kida Saal([employer])      Likes to work out       Lives with GF    Family History  Problem Relation Age of Onset  . Arthritis Father   . Coronary artery disease Father   . Coronary artery disease Brother 67  . Colon cancer Mother   . Esophageal cancer Neg Hx   . Stomach cancer Neg Hx     No Known Allergies  Medication list has been reviewed and updated.  Outpatient Prescriptions Prior to Visit  Medication Sig Dispense Refill  . acetaminophen (TYLENOL) 500 MG tablet Take 1,000 mg by mouth every 6 (six) hours as needed.        Marland Kitchen aspirin 81 MG chewable tablet Chew 81 mg by mouth daily.        Marland Kitchen ibuprofen (ADVIL,MOTRIN) 200 MG tablet Take 400 mg by mouth every 6 (six) hours as needed.        . Multiple Vitamin (MULTIVITAMIN) tablet Take 1 tablet by mouth daily.        . Omega-3 Fatty Acids (FISH OIL) 1000 MG CAPS Take 1  capsule by mouth daily.        Marland Kitchen omeprazole (PRILOSEC) 20 MG capsule Take 1 capsule (20 mg total) by mouth daily.  30 capsule  6  . terazosin (HYTRIN) 1 MG capsule TAKE 1 CAPSULE BY MOUTH EVERY NIGHT AT BEDTIME * Needs appointment with Dr for additional refills*  30 capsule  0  . meclizine (ANTIVERT) 25 MG tablet Take 1 tablet (25 mg total) by mouth 3 (three) times daily as needed for dizziness or nausea.  30 tablet  0   No facility-administered medications prior to visit.    Review of Systems:   General: Denies fever, chills, sweats. No significant weight loss. Eyes: Denies blurring,significant itching ENT: Denies earache, sore throat, and hoarseness. Cardiovascular: Denies chest pains, palpitations, dyspnea on exertion Respiratory: Denies cough, dyspnea at rest,wheeezing Breast: no concerns about lumps GI: Denies nausea, vomiting, diarrhea, constipation, change in bowel habits, abdominal pain, melena, hematochezia GU: Denies penile discharge, ED, urinary flow / outflow problems. No STD concerns. Musculoskeletal: Denies back pain, joint pain Derm: Denies rash, itching Neuro: Denies  paresthesias, frequent falls, frequent headaches Psych: Denies depression, anxiety Endocrine: Denies cold intolerance, heat intolerance, polydipsia Heme: Denies enlarged lymph nodes Allergy: No hayfever   Physical Examination: BP 102/64  Pulse 87  Temp(Src) 98.2 F (36.8 C) (Oral)  Ht 5' 5.5" (1.664 m)  Wt 146 lb 8 oz (66.452 kg)  BMI 24 kg/m2  SpO2 97%  Ideal Body Weight: Weight in (lb) to have BMI = 25: 152.2   Wt Readings from Last 3 Encounters:  02/23/13 146 lb 8 oz (66.452 kg)  02/04/13 138 lb (62.596 kg)  05/25/12 140 lb 8 oz (63.73 kg)    GEN: well developed, well nourished, no acute distress Eyes: conjunctiva and lids normal, PERRLA, EOMI ENT: TM clear, nares clear, oral exam WNL Neck: supple, no lymphadenopathy, no thyromegaly, no JVD Pulm: clear to auscultation and percussion,  respiratory effort normal CV: regular rate and rhythm, S1-S2, no murmur, rub or gallop, no bruits, peripheral pulses normal and symmetric, no cyanosis, clubbing, edema or varicosities Chest: no scars, masses GI: soft, non-tender; no hepatosplenomegaly, masses; active bowel sounds all quadrants GU: no hernia, testicular mass, penile discharge, or prostate enlargement  Lymph: no cervical, axillary or inguinal adenopathy MSK: gait normal, muscle tone and strength WNL, no joint swelling, effusions, discoloration, crepitus  SKIN: clear, good turgor, color WNL, no rashes, lesions, or ulcerations Neuro: normal mental status, normal strength, sensation, and motion Psych: alert; oriented to person, place and time, normally interactive and not anxious or depressed in appearance.  Assessment and Plan:  Routine general medical examination at a health care facility  Special screening for malignant neoplasm of prostate - Plan: PSA  Screening for lipoid disorders - Plan: LDL cholesterol, direct  Other malaise and fatigue - Plan: Basic metabolic panel, CBC with Differential, Hepatic function panel  The patient's preventative maintenance and recommended screening tests for an annual wellness exam were reviewed in full today. Brought up to date unless services declined.  Counselled on the importance of diet, exercise, and its role in overall health and mortality. The patient's FH and SH was reviewed, including their home life, tobacco status, and drug and alcohol status.   Check labs.  He is doing well.  Wellbutrin, current tobacco user.   Orders Today:  Orders Placed This Encounter  Procedures  . Basic metabolic panel  . CBC with Differential  . Hepatic function panel  . LDL cholesterol, direct  . PSA    Updated Medication List: (Includes new medications, updates to list, dose adjustments) Meds ordered this encounter  Medications  . buPROPion (WELLBUTRIN SR) 150 MG 12 hr tablet    Sig:  Take 1 tablet (150 mg total) by mouth 2 (two) times daily.    Dispense:  60 tablet    Refill:  5  . terazosin (HYTRIN) 1 MG capsule    Sig: TAKE 1 CAPSULE BY MOUTH EVERY NIGHT AT BEDTIME    Dispense:  30 capsule    Refill:  11    Medications Discontinued: Medications Discontinued During This Encounter  Medication Reason  . meclizine (ANTIVERT) 25 MG tablet Error  . terazosin (HYTRIN) 1 MG capsule Reorder      Signed, Karleen Hampshire T. Marlisa Caridi, MD 02/23/2013 2:58 PM

## 2013-02-24 LAB — BASIC METABOLIC PANEL
BUN: 11 mg/dL (ref 6–23)
CO2: 26 mEq/L (ref 19–32)
Calcium: 9.5 mg/dL (ref 8.4–10.5)
Chloride: 106 mEq/L (ref 96–112)
Creatinine, Ser: 1 mg/dL (ref 0.4–1.5)
GFR: 83.11 mL/min (ref >60.00)
Glucose, Bld: 104 mg/dL — ABNORMAL HIGH (ref 70–99)
Potassium: 3.8 mEq/L (ref 3.5–5.1)
Sodium: 140 mEq/L (ref 135–145)

## 2013-02-24 LAB — CBC WITH DIFFERENTIAL/PLATELET
Basophils Absolute: 0.1 10*3/uL (ref 0.0–0.1)
Basophils Relative: 0.8 % (ref 0.0–3.0)
Eosinophils Absolute: 0.2 10*3/uL (ref 0.0–0.7)
Eosinophils Relative: 1.8 % (ref 0.0–5.0)
HCT: 39.8 % (ref 39.0–52.0)
Hemoglobin: 13.5 g/dL (ref 13.0–17.0)
Lymphocytes Relative: 29.9 % (ref 12.0–46.0)
Lymphs Abs: 2.8 10*3/uL (ref 0.7–4.0)
MCHC: 33.9 g/dL (ref 30.0–36.0)
MCV: 94.4 fl (ref 78.0–100.0)
Monocytes Absolute: 0.5 10*3/uL (ref 0.1–1.0)
Monocytes Relative: 5.5 % (ref 3.0–12.0)
Neutro Abs: 5.9 10*3/uL (ref 1.4–7.7)
Neutrophils Relative %: 62 % (ref 43.0–77.0)
Platelets: 235 10*3/uL (ref 150.0–400.0)
RBC: 4.22 Mil/uL (ref 4.22–5.81)
RDW: 14.3 % (ref 11.5–14.6)
WBC: 9.4 10*3/uL (ref 4.5–10.5)

## 2013-02-24 LAB — HEPATIC FUNCTION PANEL
ALT: 15 U/L (ref 0–53)
AST: 19 U/L (ref 0–37)
Albumin: 4.1 g/dL (ref 3.5–5.2)
Alkaline Phosphatase: 69 U/L (ref 39–117)
Bilirubin, Direct: 0.1 mg/dL (ref 0.0–0.3)
Total Bilirubin: 0.3 mg/dL (ref 0.3–1.2)
Total Protein: 6.8 g/dL (ref 6.0–8.3)

## 2013-02-24 LAB — PSA: PSA: 0.83 ng/mL (ref 0.10–4.00)

## 2013-02-24 LAB — LDL CHOLESTEROL, DIRECT: Direct LDL: 139.4 mg/dL

## 2013-02-28 ENCOUNTER — Encounter: Payer: Self-pay | Admitting: *Deleted

## 2013-03-28 ENCOUNTER — Telehealth: Payer: Self-pay | Admitting: Family Medicine

## 2013-03-28 ENCOUNTER — Observation Stay: Payer: Self-pay | Admitting: Internal Medicine

## 2013-03-28 ENCOUNTER — Encounter: Payer: Self-pay | Admitting: Family Medicine

## 2013-03-28 ENCOUNTER — Telehealth: Payer: Self-pay

## 2013-03-28 DIAGNOSIS — Z0289 Encounter for other administrative examinations: Secondary | ICD-10-CM

## 2013-03-28 LAB — CBC
HCT: 38.5 % — ABNORMAL LOW (ref 40.0–52.0)
HGB: 13.6 g/dL (ref 13.0–18.0)
MCH: 32.8 pg (ref 26.0–34.0)
MCHC: 35.3 g/dL (ref 32.0–36.0)
MCV: 93 fL (ref 80–100)
Platelet: 211 10*3/uL (ref 150–440)
RBC: 4.13 10*6/uL — ABNORMAL LOW (ref 4.40–5.90)
RDW: 14.3 % (ref 11.5–14.5)
WBC: 11 10*3/uL — ABNORMAL HIGH (ref 3.8–10.6)

## 2013-03-28 LAB — BASIC METABOLIC PANEL
Anion Gap: 4 — ABNORMAL LOW (ref 7–16)
BUN: 14 mg/dL (ref 7–18)
Calcium, Total: 8.6 mg/dL (ref 8.5–10.1)
Chloride: 107 mmol/L (ref 98–107)
Co2: 28 mmol/L (ref 21–32)
Creatinine: 1.11 mg/dL (ref 0.60–1.30)
EGFR (African American): >60
EGFR (Non-African Amer.): >60
Glucose: 108 mg/dL — ABNORMAL HIGH (ref 65–99)
Osmolality: 279 (ref 275–301)
Potassium: 3.3 mmol/L — ABNORMAL LOW (ref 3.5–5.1)
Sodium: 139 mmol/L (ref 136–145)

## 2013-03-28 LAB — TROPONIN I: Troponin-I: <0.02 ng/mL

## 2013-03-28 LAB — CK-MB: CK-MB: 0.6 ng/mL (ref 0.5–3.6)

## 2013-03-28 NOTE — Telephone Encounter (Signed)
Recommend ER but if pt refuses.. Will see him at 2pm

## 2013-03-28 NOTE — Telephone Encounter (Signed)
Patient Information:  Caller Name: Lanice Schwab  Phone: [phone]  Patient: Isaiah Rangel, Isaiah Rangel  Gender: Male  DOB: 05-10-1961  Age: 52 Years  PCP: Hannah Beat [facility])  Office Follow Up:  Does the office need to follow up with this patient?: No  Instructions For The Office: N/A  RN Note:  Spouse states pain was 8/10 on three occasions.  States he took multiple Tums thinking it was indigestion.  States has "strong cardiac history in the family."  States she attempted to get him to go to ED, but he has refused.  States he c/o dizziness 03/28/13.  Recently started welbutrin 1 month ago to help him quit smoking.  Has been having increased shortness of breath on exertion over past few days.  Per "intermittent chest pain and has been increasing in frequency/severity," disposition ED or office now with approval; patient refuses ED.  TC to office; may be seen at 1400 03/28/13 with Dr. Ermalene Searing.  Patient agrees to appt with Dr. Ermalene Searing.  krs/can  Symptoms  Reason For Call & Symptoms: chest pain centered pressure 03/25/13 BP 130/84 occurred 3 times over the weekend  Reviewed Health History In EMR: Yes  Reviewed Medications In EMR: Yes  Reviewed Allergies In EMR: Yes  Reviewed Surgeries / Procedures: Yes  Date of Onset of Symptoms: 03/25/2013  Guideline(s) Used:  Chest Pain  Disposition Per Guideline:   Go to ED Now (or to Office with PCP Approval)  Reason For Disposition Reached:   Intermittent chest pain and pain has been increasing in severity or frequency  Advice Given:  N/A  Patient Will Follow Care Advice:  YES  Appointment Scheduled:  03/28/2013 14:00:00 Appointment Scheduled Provider:  Kerby Nora (Family Practice)

## 2013-03-28 NOTE — Telephone Encounter (Signed)
Isaiah Rangel with CAN, pt having center of chest pain with SOB on and off since 03/25/13. Pt has been taking Tums for indigestion. CAN disposition is go to ED; pt has refused all weekend to go to ED. Pt not having CP,radiating pain, N&V, sweating now. Dr Ermalene Searing advised pt should go to ED now for eval; if pt refuses can schedule with Dr Ermalene Searing today at 2 pm and explain to pt if condition changes or worsens pt to go to ED prior to appt.Isaiah Rangel voiced understanding and would talk with pt and send note to Dr Ermalene Searing.

## 2013-03-29 ENCOUNTER — Ambulatory Visit (INDEPENDENT_AMBULATORY_CARE_PROVIDER_SITE_OTHER): Payer: 59 | Admitting: Family Medicine

## 2013-03-29 DIAGNOSIS — R079 Chest pain, unspecified: Secondary | ICD-10-CM

## 2013-03-29 DIAGNOSIS — I369 Nonrheumatic tricuspid valve disorder, unspecified: Secondary | ICD-10-CM

## 2013-03-29 LAB — CBC WITH DIFFERENTIAL/PLATELET
Basophil #: 0.1 10*3/uL (ref 0.0–0.1)
Basophil %: 0.9 %
Eosinophil #: 0.4 10*3/uL (ref 0.0–0.7)
Eosinophil %: 4.3 %
HCT: 38.3 % — ABNORMAL LOW (ref 40.0–52.0)
HGB: 13.5 g/dL (ref 13.0–18.0)
Lymphocyte #: 3.4 10*3/uL (ref 1.0–3.6)
Lymphocyte %: 39.9 %
MCH: 32.6 pg (ref 26.0–34.0)
MCHC: 35.2 g/dL (ref 32.0–36.0)
MCV: 93 fL (ref 80–100)
Monocyte #: 0.7 x10 3/mm (ref 0.2–1.0)
Monocyte %: 8.1 %
Neutrophil #: 3.9 10*3/uL (ref 1.4–6.5)
Neutrophil %: 46.8 %
Platelet: 218 10*3/uL (ref 150–440)
RBC: 4.14 10*6/uL — ABNORMAL LOW (ref 4.40–5.90)
RDW: 14.5 % (ref 11.5–14.5)
WBC: 8.4 10*3/uL (ref 3.8–10.6)

## 2013-03-29 LAB — LIPID PANEL
Cholesterol: 158 mg/dL (ref 0–200)
HDL Cholesterol: 31 mg/dL — ABNORMAL LOW (ref 40–60)
Ldl Cholesterol, Calc: 88 mg/dL (ref 0–100)
Triglycerides: 193 mg/dL (ref 0–200)
VLDL Cholesterol, Calc: 39 mg/dL (ref 5–40)

## 2013-03-29 LAB — PROTIME-INR
INR: 0.9
Prothrombin Time: 12.5 secs (ref 11.5–14.7)

## 2013-03-29 LAB — BASIC METABOLIC PANEL
Anion Gap: 3 — ABNORMAL LOW (ref 7–16)
BUN: 16 mg/dL (ref 7–18)
Calcium, Total: 8.5 mg/dL (ref 8.5–10.1)
Chloride: 110 mmol/L — ABNORMAL HIGH (ref 98–107)
Co2: 29 mmol/L (ref 21–32)
Creatinine: 1.13 mg/dL (ref 0.60–1.30)
EGFR (African American): >60
EGFR (Non-African Amer.): >60
Glucose: 130 mg/dL — ABNORMAL HIGH (ref 65–99)
Osmolality: 286 (ref 275–301)
Potassium: 3.6 mmol/L (ref 3.5–5.1)
Sodium: 142 mmol/L (ref 136–145)

## 2013-03-29 LAB — TROPONIN I
Troponin-I: <0.02 ng/mL
Troponin-I: <0.02 ng/mL

## 2013-03-29 LAB — CK-MB
CK-MB: 0.6 ng/mL (ref 0.5–3.6)
CK-MB: 0.9 ng/mL (ref 0.5–3.6)

## 2013-03-29 LAB — TSH: Thyroid Stimulating Horm: 2.66 u[IU]/mL

## 2013-03-30 ENCOUNTER — Encounter: Payer: Self-pay | Admitting: Cardiovascular Disease

## 2013-03-30 NOTE — Progress Notes (Signed)
  Subjective:    Patient ID: Isaiah Rangel, male    DOB: [birth year], 52 y.o.   MRN: [id number]  HPI  Pt cancelled  Review of Systems     Objective:   Physical Exam        Assessment & Plan:

## 2013-04-03 ENCOUNTER — Telehealth: Payer: Self-pay | Admitting: *Deleted

## 2013-04-03 NOTE — Telephone Encounter (Signed)
Reviewed instructions for heart cath at [facility] for 8/7 930am arrival with Dr.Gollan.

## 2013-04-03 NOTE — Telephone Encounter (Signed)
Patient needs all the info.( date and time) for his heart cath on Thursday. If he is unable to answer his phone please leave a message. Thanks

## 2013-04-06 ENCOUNTER — Ambulatory Visit: Payer: Self-pay | Admitting: Cardiovascular Disease

## 2013-04-06 DIAGNOSIS — R079 Chest pain, unspecified: Secondary | ICD-10-CM

## 2013-04-06 HISTORY — PX: CARDIAC CATHETERIZATION: SHX172

## 2013-04-11 ENCOUNTER — Encounter: Payer: Self-pay | Admitting: *Deleted

## 2013-04-19 ENCOUNTER — Encounter: Payer: Self-pay | Admitting: Physician Assistant

## 2013-04-19 ENCOUNTER — Ambulatory Visit (INDEPENDENT_AMBULATORY_CARE_PROVIDER_SITE_OTHER): Payer: 59 | Admitting: Physician Assistant

## 2013-04-19 VITALS — BP 90/60 | HR 83 | Ht 65.0 in | Wt 148.2 lb

## 2013-04-19 DIAGNOSIS — R079 Chest pain, unspecified: Secondary | ICD-10-CM

## 2013-04-19 DIAGNOSIS — Z9889 Other specified postprocedural states: Secondary | ICD-10-CM

## 2013-04-19 DIAGNOSIS — K219 Gastro-esophageal reflux disease without esophagitis: Secondary | ICD-10-CM

## 2013-04-19 DIAGNOSIS — E785 Hyperlipidemia, unspecified: Secondary | ICD-10-CM

## 2013-04-19 DIAGNOSIS — F17200 Nicotine dependence, unspecified, uncomplicated: Secondary | ICD-10-CM

## 2013-04-19 DIAGNOSIS — H8110 Benign paroxysmal vertigo, unspecified ear: Secondary | ICD-10-CM

## 2013-04-19 NOTE — Assessment & Plan Note (Signed)
The patient feels well post-cath. Cath site healing well w/o incident. Minor luminal irregularities, largely clean coronaries; EF > 55%.

## 2013-04-19 NOTE — Assessment & Plan Note (Signed)
Suspect the main etiology of the patient's discomfort. Continue PPI. Discusses coupling with H2 blocker and antacids PRN.

## 2013-04-19 NOTE — Patient Instructions (Addendum)
Continue current medications including low-dose aspirin and Lipitor.   Please consider nicotine gum as a means of quitting as we discussed today.   We will see you back as needed.   Please follow-up with Dr. Dallas Schimke regularly to follow your cholesterol, tobacco cessation, vertigo, COPD, GERD, etic.

## 2013-04-19 NOTE — Assessment & Plan Note (Addendum)
LDL 88, HDL 31, TG 193, TC 158. 10 year ASCVD risk is 5.9%. The patient does have mild luminal irregularities and ongoing tobacco abuse. Therefore, would continue statin therapy.

## 2013-04-19 NOTE — Assessment & Plan Note (Signed)
Discussed smoking cessation for > 10 minutes today. He is intolerant to Chantix due to dizziness. Wellbutrin has caused mood changes in the past. 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 He his hesitant about e-cigarettes due to lack of knowledge of vapor makeup (with reason), however does continue to smoke. Talked about nicotine gum and using e-cigarettes as a means to an end to wean off cigarettes in the next 3-6 months. He will make an effort to accomplish this.

## 2013-04-19 NOTE — Assessment & Plan Note (Signed)
Alleviated by Epley maneuvers after PT eval/treatment. He has been given printed instructions for future use should this recur.

## 2013-04-19 NOTE — Progress Notes (Signed)
Date:  04/19/2013   ID:  Isaiah Rangel, DOB May 08, 1961, MRN [id number]  PCP:  Hannah Beat, MD  Primary Cardiologist:  Concha Se, MD  History of Present Illness:  Isaiah Rangel is a 52 y.o. male w/ PMHx s/f tobacco abuse, family h/o premature CAD, HLD, GERD, anxiety, BPH, hemorrhagic renal cyst and history of cervical pain s/p fusion who presents today for post-hospital follow-up.   He was admitted at [facility] 7/29 to 03/29/13 for chest pain and dizziness. No ischemic EKG changes, CXR nonacute and he ruled out overnight. He underwent Lexiscan Myoview revealing mid to distal  anterior wall perfusion defect at stress and rest concerning for ischemia, artifact unable to be excluded. Significant GI uptake was described as well; EF 56%. Deemed to be an overall moderate risk scan. Follow-up diagnostic cardiac catheterization was planned as an outpatient.   In terms of dizziness work-up, carotid dopplers revealed scattered atherosclerotic calcifications in the carotid bulb w/o evidence of hemodynamically significant stenosis. Brain MRI revealed no acute abnormality. Echo- EF 60-65%, mild TR, normal RV size, function, RVSP, no WMAs.   He presented on 04/06/13 for this procedure which revealed minor luminal irregularities, otherwise normal cors; EF > 55%. Medical management and smoking cessation was recommended.   Labwork:  TSH WNL at 2.66  Lipids- LDL 88, HDL 31, TG 193, TC 158   He has felt well since discharge. He tells me a PT performed vestibular testing and determined the patient had otolith displacement which was realigned with Epley maneuvers. Dizziness completely resolved and has not recurred. No further chest pain. He noticed the discomfort after eating a [employer] sandwich, and again the following day which was not associated with a meal. There was a change in quality with belching. He continues to smoke a few cigarettes a day when under stress. He has adhered to discharge  medications- particularly low-dose ASA and atorvastatin.   EKG: NSR, 83 bpm, no ST/T changes  Wt Readings from Last 3 Encounters:  04/19/13 148 lb 4 oz (67.246 kg)  02/23/13 146 lb 8 oz (66.452 kg)  02/04/13 138 lb (62.596 kg)     Past Medical History  Diagnosis Date  . OA (osteoarthritis)   . Current smoker   . GERD (gastroesophageal reflux disease)   . Anxiety   . BPH (benign prostatic hyperplasia)     Current Outpatient Prescriptions  Medication Sig Dispense Refill  . acetaminophen (TYLENOL) 500 MG tablet Take 1,000 mg by mouth every 6 (six) hours as needed.        Marland Kitchen aspirin 81 MG chewable tablet Chew 81 mg by mouth daily.        Marland Kitchen atorvastatin (LIPITOR) 40 MG tablet Take 40 mg by mouth daily.      Marland Kitchen ibuprofen (ADVIL,MOTRIN) 200 MG tablet Take 400 mg by mouth every 6 (six) hours as needed.        . Multiple Vitamin (MULTIVITAMIN) tablet Take 1 tablet by mouth daily.        . Omega-3 Fatty Acids (FISH OIL) 1000 MG CAPS Take 1 capsule by mouth daily.        Marland Kitchen omeprazole (PRILOSEC) 20 MG capsule Take 1 capsule (20 mg total) by mouth daily.  30 capsule  6  . terazosin (HYTRIN) 1 MG capsule TAKE 1 CAPSULE BY MOUTH EVERY NIGHT AT BEDTIME  30 capsule  11   No current facility-administered medications for this visit.    Allergies:   No Known Allergies  Social History:  The patient  reports that he has been smoking Cigarettes.  He has a 45 pack-year smoking history. He has never used smokeless tobacco. He reports that he does not drink alcohol or use illicit drugs. Works in Producer, television/film/video business.   Family History:  Family History  Problem Relation Age of Onset  . Arthritis Father   . Coronary artery disease Father   . Hypertension Father   . Coronary artery disease Brother 29  . Colon cancer Mother   . Esophageal cancer Neg Hx   . Stomach cancer Neg Hx   . Heart attack Brother   . Heart attack Brother     Review of Systems: General: negative for chills,  fever, night sweats or weight changes.  Cardiovascular: negative for chest pain, dyspnea on exertion, edema, orthopnea, palpitations, paroxysmal nocturnal dyspnea or shortness of breath Dermatological: negative for rash Respiratory: negative for cough or wheezing Urologic: negative for hematuria Abdominal: positive for occasional indigestion, negative for nausea, vomiting, diarrhea, bright red blood per rectum, melena, or hematemesis Neurologic: negative for visual changes, syncope, or dizziness All other systems reviewed and are otherwise negative except as noted above.  PHYSICAL EXAM: VS:  BP 90/60  Pulse 83  Ht 5\' 5"  (1.651 m)  Wt 148 lb 4 oz (67.246 kg)  BMI 24.67 kg/m2 Well nourished, well developed, in no acute distress HEENT: normal, PERRL Neck: no JVD or bruits Cardiac:  normal S1, S2; RRR; no murmur or gallops Lungs:  clear to auscultation bilaterally, no wheezing, rhonchi or rales Abd: soft, nontender, no hepatomegaly, normoactive BS x 4 quads Ext: no edema, cyanosis or clubbing; R thenar atrophy, cath site w/o evidence of swelling, ecchymosis, tenderness or discharge/bleeding Skin: warm and dry, cap refill < 2 sec Neuro:  CNs 2-12 intact, no focal abnormalities noted Musculoskeletal: strength and tone appropriate for age  Psych: normal affect

## 2013-05-04 ENCOUNTER — Encounter: Payer: Self-pay | Admitting: Cardiovascular Disease

## 2013-05-29 ENCOUNTER — Ambulatory Visit (INDEPENDENT_AMBULATORY_CARE_PROVIDER_SITE_OTHER): Payer: 59 | Admitting: Family Medicine

## 2013-05-29 ENCOUNTER — Encounter: Payer: Self-pay | Admitting: Family Medicine

## 2013-05-29 VITALS — BP 102/60 | HR 72 | Temp 98.4°F | Wt 149.5 lb

## 2013-05-29 DIAGNOSIS — L237 Allergic contact dermatitis due to plants, except food: Secondary | ICD-10-CM | POA: Insufficient documentation

## 2013-05-29 DIAGNOSIS — L255 Unspecified contact dermatitis due to plants, except food: Secondary | ICD-10-CM

## 2013-05-29 MED ORDER — PREDNISONE 20 MG PO TABS: ORAL_TABLET | ORAL | Status: DC

## 2013-05-29 MED ORDER — TRIAMCINOLONE ACETONIDE 0.5 % EX CREA: TOPICAL_CREAM | Freq: Three times a day (TID) | CUTANEOUS | Status: DC

## 2013-05-29 NOTE — Assessment & Plan Note (Signed)
Likely dx.  Topical TAC for now, then use oral pred with GI/steroid caution if not improved, he agrees.

## 2013-05-29 NOTE — Progress Notes (Signed)
Likely poison ivy on R forearm and R side of chest.  Itchy.  Feels like it is coming up on the L forearm.  No FCNAVD.  Present for ~2 weeks. Tried OTC hydrocortisone.    Meds, vitals, and allergies reviewed.   ROS: See HPI.  Otherwise, noncontributory.  nad Irregular blanching rash on the R forearm and R side of the chest.  Not dermatomal

## 2013-05-29 NOTE — Patient Instructions (Addendum)
Use the cream for now.  Use the pills is not better.  Take care.

## 2013-06-28 ENCOUNTER — Encounter: Payer: Self-pay | Admitting: Family Medicine

## 2013-06-28 ENCOUNTER — Ambulatory Visit (INDEPENDENT_AMBULATORY_CARE_PROVIDER_SITE_OTHER): Payer: 59 | Admitting: Family Medicine

## 2013-06-28 VITALS — BP 103/72 | HR 67 | Temp 98.4°F | Ht 65.0 in | Wt 149.5 lb

## 2013-06-28 DIAGNOSIS — S46812A Strain of other muscles, fascia and tendons at shoulder and upper arm level, left arm, initial encounter: Secondary | ICD-10-CM

## 2013-06-28 DIAGNOSIS — S46819A Strain of other muscles, fascia and tendons at shoulder and upper arm level, unspecified arm, initial encounter: Secondary | ICD-10-CM

## 2013-06-28 DIAGNOSIS — S43499A Other sprain of unspecified shoulder joint, initial encounter: Secondary | ICD-10-CM

## 2013-06-28 MED ORDER — HYDROCODONE-ACETAMINOPHEN 5-325 MG PO TABS: 1.0000 | ORAL_TABLET | Freq: Four times a day (QID) | ORAL | Status: DC | PRN

## 2013-06-28 MED ORDER — OXAPROZIN 600 MG PO TABS: 1200.0000 mg | ORAL_TABLET | Freq: Every day | ORAL | Status: DC

## 2013-06-28 NOTE — Progress Notes (Signed)
Date:  06/28/2013   Name:  Isaiah Rangel   DOB:  [birth year]   MRN:  [id number] Gender: male Age: 52 y.o.  Primary Physician:  Hannah Beat, MD   Chief Complaint: Shoulder Pain   History of Present Illness:  Isaiah Rangel is a 52 y.o. very pleasant male patient who presents with the following:  DOI 06/25/2013  Left Shoulder pain: had to move about 40 pieces of floor tile, and Sunday afternoon, shoulder started and 10/10 pain. Pain has gone down. It is all in the posterior aspect of his shoulder and shoulder blade area in the muscle adjacent to this in the trap and potentially the upper rhomboid. He has not had any bruising. He has not had any limitation in his abduction, flexion or internal and external range of motion. He has been in some significant, severe pain, and he is unable to lift things at work.    Past Medical History, Surgical History, Social History, Family History, Problem List, Medications, and Allergies have been reviewed and updated if relevant.  Current Outpatient Prescriptions on File Prior to Visit  Medication Sig Dispense Refill  . acetaminophen (TYLENOL) 500 MG tablet Take 1,000 mg by mouth every 6 (six) hours as needed.        Marland Kitchen aspirin 81 MG chewable tablet Chew 81 mg by mouth daily.        Marland Kitchen atorvastatin (LIPITOR) 40 MG tablet Take 40 mg by mouth daily.      Marland Kitchen ibuprofen (ADVIL,MOTRIN) 200 MG tablet Take 400 mg by mouth every 6 (six) hours as needed.        . Multiple Vitamin (MULTIVITAMIN) tablet Take 1 tablet by mouth daily.        Marland Kitchen omeprazole (PRILOSEC) 20 MG capsule Take 1 capsule (20 mg total) by mouth daily.  30 capsule  6  . terazosin (HYTRIN) 1 MG capsule TAKE 1 CAPSULE BY MOUTH EVERY NIGHT AT BEDTIME  30 capsule  11   No current facility-administered medications on file prior to visit.    Review of Systems:  GEN: No fevers, chills. Nontoxic. Primarily MSK c/o today. MSK: Detailed in the HPI GI: tolerating PO intake without  difficulty Neuro: No numbness, parasthesias, or tingling associated. Otherwise the pertinent positives of the ROS are noted above.    Physical Examination: BP 103/72  Pulse 67  Temp(Src) 98.4 F (36.9 C) (Oral)  Ht 5\' 5"  (1.651 m)  Wt 149 lb 8 oz (67.813 kg)  BMI 24.88 kg/m2   GEN: WDWN, NAD, Non-toxic, Alert & Oriented x 3 HEENT: Atraumatic, Normocephalic.  Ears and Nose: No external deformity. EXTR: No clubbing/cyanosis/edema NEURO: Normal gait.  PSYCH: Normally interactive. Conversant. Not depressed or anxious appearing.  Calm demeanor.   Shoulder: L Inspection: No muscle wasting or winging Ecchymosis/edema: neg  AC joint, scapula, clavicle: NT Cervical spine: NT, full ROM Spurling's: neg Abduction: full, 5/5 Flexion: full, 5/5 IR, full, lift-off: 5/5 ER at neutral: full, 5/5 AC crossover and compression: neg Neer: neg Hawkins: neg Drop Test: neg Empty Can: neg Supraspinatus insertion: NT Bicipital groove: NT Sulcus sign: neg Scapular Muscles: Markedly tender along the lower left trapezius and scapular border. I do not palpate a deficit or defect. C5-T1 intact Sensation intact Grip 5/5   Assessment and Plan:  Trapezius strain, left, initial encounter  Likely first degree tear trapezius versus rhomboid and with some significant ongoing pain. 10 out of 10 over the last few days. I am to place him  on some scheduled Daypro and give him some Vicodin when necessary. Reviewed some basic stretching and rehabilitation.  Orders Today:  No orders of the defined types were placed in this encounter.    Updated Medication List: (Includes new medications, updates to list, dose adjustments) Meds ordered this encounter  Medications  . oxaprozin (DAYPRO) 600 MG tablet    Sig: Take 2 tablets (1,200 mg total) by mouth daily.    Dispense:  60 tablet    Refill:  0  . HYDROcodone-acetaminophen (NORCO/VICODIN) 5-325 MG per tablet    Sig: Take 1 tablet by mouth every 6 (six)  hours as needed for pain.    Dispense:  30 tablet    Refill:  0    Medications Discontinued: Medications Discontinued During This Encounter  Medication Reason  . predniSONE (DELTASONE) 20 MG tablet Completed Course  . triamcinolone cream (KENALOG) 0.5 % Completed Course      Signed,  Karleen Hampshire T. Kaitlynn Tramontana, MD, CAQ Sports Medicine  [employer] [facility] [address] [place] [address] Phone: [phone] Fax: [phone]

## 2013-12-05 ENCOUNTER — Ambulatory Visit (INDEPENDENT_AMBULATORY_CARE_PROVIDER_SITE_OTHER): Payer: 59 | Admitting: Family Medicine

## 2013-12-05 ENCOUNTER — Encounter: Payer: Self-pay | Admitting: Family Medicine

## 2013-12-05 VITALS — BP 118/66 | HR 75 | Temp 97.3°F | Wt 146.8 lb

## 2013-12-05 DIAGNOSIS — IMO0002 Reserved for concepts with insufficient information to code with codable children: Secondary | ICD-10-CM

## 2013-12-05 DIAGNOSIS — IMO0001 Reserved for inherently not codable concepts without codable children: Secondary | ICD-10-CM | POA: Insufficient documentation

## 2013-12-05 MED ORDER — PREDNISONE 20 MG PO TABS: ORAL_TABLET | ORAL | Status: DC

## 2013-12-05 MED ORDER — HYDROCODONE-ACETAMINOPHEN 5-325 MG PO TABS: 1.0000 | ORAL_TABLET | Freq: Four times a day (QID) | ORAL | Status: DC | PRN

## 2013-12-05 NOTE — Progress Notes (Signed)
BP 118/66  Pulse 75  Temp(Src) 97.3 F (36.3 C) (Oral)  Wt 146 lb 12 oz (66.565 kg)  SpO2 99%   CC: back pain  Subjective:    Patient ID: Isaiah Rangel, male    DOB: 1960/12/11, 53 y.o.   MRN: [id number]  HPI: Isaiah Rangel is a 53 y.o. male presenting on 12/05/2013 for Back Pain   Husband of my patient Isaiah Rangel. Pleasant pt of Dr. Lillie Fragmin presents today to discuss acute lower back pain.  Saturday (3d ago) moved wood pile and had progressive lower back pain with radiation down right leg, unable to finish chores.  Sunday pain worsening.  Main pain at right lower back, and describes pressure sensation traveling down right leg.  Leg feels heavy. Denies leg weakness or numbness.   This is much different than prior back pains.   Today pain moved to R groin/scrotum. Out of work last 2 days.  In bed all day yesterday.  Denies fevers/chills, bowel/bladder accidents, saddle anesthesia. No personal h/o cancer. H/o lumbar surgery 10 yrs ago, h/o neck surgery and R knee surgery.  Flexeril caused oversedation and didn't significantly help back pain.  Ibuprofen 800mg .  Heating pad has helped as well.  Intermittently uses tylenol/ibuprofen.  Relevant past medical, surgical, family and social history reviewed and updated as indicated.  Allergies and medications reviewed and updated. Current Outpatient Prescriptions on File Prior to Visit  Medication Sig  . acetaminophen (TYLENOL) 500 MG tablet Take 1,000 mg by mouth every 6 (six) hours as needed.    Marland Kitchen aspirin 81 MG chewable tablet Chew 81 mg by mouth daily.    Marland Kitchen atorvastatin (LIPITOR) 40 MG tablet Take 40 mg by mouth daily.  Marland Kitchen ibuprofen (ADVIL,MOTRIN) 200 MG tablet Take 400 mg by mouth every 6 (six) hours as needed.    . Multiple Vitamin (MULTIVITAMIN) tablet Take 1 tablet by mouth daily.    Marland Kitchen omeprazole (PRILOSEC) 20 MG capsule Take 1 capsule (20 mg total) by mouth daily.  Marland Kitchen oxaprozin (DAYPRO) 600 MG tablet Take 2 tablets (1,200 mg  total) by mouth daily.  Marland Kitchen terazosin (HYTRIN) 1 MG capsule TAKE 1 CAPSULE BY MOUTH EVERY NIGHT AT BEDTIME   No current facility-administered medications on file prior to visit.    Review of Systems Per HPI unless specifically indicated above    Objective:    BP 118/66  Pulse 75  Temp(Src) 97.3 F (36.3 C) (Oral)  Wt 146 lb 12 oz (66.565 kg)  SpO2 99%  Physical Exam  Nursing note and vitals reviewed. Constitutional: He appears well-developed and well-nourished. No distress.  Stiff movements, difficulty getting on and off exam table  Musculoskeletal: He exhibits no edema.  Tender R lower back when laying supine. No pain midline spine No paraspinous mm tenderness Neg SLR bilaterally. No pain with int/ext rotation at hip. Neg FABER. No pain at SIJ, GTB or sciatic notch bilaterally.  Neurological: He has normal strength. No sensory deficit. Gait (antalgic) abnormal. Coordination normal.  Reflex Scores:      Patellar reflexes are 2+ on the right side and 1+ on the left side. 5/5 strength BLE Sensation intact       Assessment & Plan:   Problem List Items Addressed This Visit   Radicular pain of right lower back - Primary     Started after moving pieces of wood in back yard.  Anticipate HNP leading to radiculopathy but no motor deficit noted today.   Conservative measures recommended  along with treatment with prednisone course and vicodin for breakthrough pain.  Continue heating pad use. Discussed avoiding NSAIDs while on prednisone. Should improve with supportive care. Red flags to return discussed.        Follow up plan: Return if symptoms worsen or fail to improve.

## 2013-12-05 NOTE — Patient Instructions (Addendum)
You may have bulging disc pinching on a nerve as it comes out of the spine. Treat with prednisone course and vicodin for breakthrough pain. This should improve with time. If worsening pain or weakness please let me know sooner for imaging study. Let us know if you need a note for work.

## 2013-12-05 NOTE — Assessment & Plan Note (Signed)
Started after moving pieces of wood in back yard.  Anticipate HNP leading to radiculopathy but no motor deficit noted today.   Conservative measures recommended along with treatment with prednisone course and vicodin for breakthrough pain.  Continue heating pad use. Discussed avoiding NSAIDs while on prednisone. Should improve with supportive care. Red flags to return discussed.

## 2013-12-06 ENCOUNTER — Telehealth: Payer: Self-pay | Admitting: Family Medicine

## 2013-12-06 NOTE — Telephone Encounter (Signed)
Relevant patient education mailed to patient.  

## 2014-01-04 ENCOUNTER — Ambulatory Visit (INDEPENDENT_AMBULATORY_CARE_PROVIDER_SITE_OTHER): Payer: 59 | Admitting: Family Medicine

## 2014-01-04 ENCOUNTER — Encounter: Payer: Self-pay | Admitting: Family Medicine

## 2014-01-04 ENCOUNTER — Ambulatory Visit (INDEPENDENT_AMBULATORY_CARE_PROVIDER_SITE_OTHER)
Admission: RE | Admit: 2014-01-04 | Discharge: 2014-01-04 | Disposition: A | Discharge status: Home / Self Care | Payer: 59 | Source: Ambulatory Visit | Attending: Family Medicine | Admitting: Family Medicine

## 2014-01-04 VITALS — BP 100/60 | HR 79 | Temp 98.2°F | Ht 65.5 in | Wt 148.8 lb

## 2014-01-04 DIAGNOSIS — IMO0002 Reserved for concepts with insufficient information to code with codable children: Secondary | ICD-10-CM

## 2014-01-04 DIAGNOSIS — IMO0001 Reserved for inherently not codable concepts without codable children: Secondary | ICD-10-CM

## 2014-01-04 MED ORDER — PREDNISONE 20 MG PO TABS: ORAL_TABLET | ORAL | Status: DC

## 2014-01-04 NOTE — Assessment & Plan Note (Signed)
Continued pain, worsening. No benefit from NSAIDs x 4 weeks. Repeat prednisone taper. Eval X-ray but will plan on MRI lumbar given severity of pain and pt history.

## 2014-01-04 NOTE — Progress Notes (Signed)
53 year old pt t of Dr. Lillie Fragmin presents for  acute lower back pain.   He was seen by Dr. Darnell Level in 11/2013 following progressive lower back pain with radiation down right leg.  He was treated at that time with pain meds and prednione.  Now he has noted:  Rec Pain improved some from last OV, but never resolved. He limited activity. Severe pain returned in last week after mowing the yard.. In right buttock radiating down right leg. He has now noted right leg weakness. Pain is more severe   Pain with bending and walking.  No numbness.  Nausea, and pain severe enough that he cannot eat.  Occ pain in testicles, no numbness. Hydrocodone has been making him nauseous.   Denies fevers/chills, bowel/bladder accidents, saddle anesthesia. No personal h/o cancer. H/o lumbar surgery 10 yrs ago, h/o neck surgery and R knee surgery.  Had no benefit with steroid injectrions 10 years ago.   Flexeril caused oversedation and didn't significantly help back pain.  Ibuprofen 800mg .  Heating pad has helped as well.  Intermittently uses tylenol/ibuprofen.   Relevant past medical, surgical, family and social history reviewed and updated as indicated.   Allergies and medications reviewed and updated. Current Outpatient Prescriptions on File Prior to Visit   Medication  Sig   .  acetaminophen (TYLENOL) 500 MG tablet  Take 1,000 mg by mouth every 6 (six) hours as needed.     Marland Kitchen  aspirin 81 MG chewable tablet  Chew 81 mg by mouth daily.     Marland Kitchen  atorvastatin (LIPITOR) 40 MG tablet  Take 40 mg by mouth daily.   Marland Kitchen  ibuprofen (ADVIL,MOTRIN) 200 MG tablet  Take 400 mg by mouth every 6 (six) hours as needed.     .  Multiple Vitamin (MULTIVITAMIN) tablet  Take 1 tablet by mouth daily.     Marland Kitchen  omeprazole (PRILOSEC) 20 MG capsule  Take 1 capsule (20 mg total) by mouth daily.   Marland Kitchen  oxaprozin (DAYPRO) 600 MG tablet  Take 2 tablets (1,200 mg total) by mouth daily.   Marland Kitchen  terazosin (HYTRIN) 1 MG capsule  TAKE 1 CAPSULE BY MOUTH  EVERY NIGHT AT BEDTIME       No current facility-administered medications on file prior to visit.        Review of Systems Per HPI unless specifically indicated above     Objective:   Nursing note and vitals reviewed. Constitutional: He appears well-developed and well-nourished. No distress.  Stiff movements, difficulty getting on and off exam table  Musculoskeletal: He exhibits no edema.  Tender R lower back an right buttock when laying supine. No pain midline spine No paraspinous mm tenderness Neg SLR bilaterally. No pain with int/ext rotation at hip. Neg FABER. No pain at SIJ, GTB or sciatic notch bilaterally.  Neurological: He has normal strength. No sensory deficit. Gait (antalgic) abnormal. Coordination normal.   Reflex Scores:      Patellar reflexes are 2+ on the right side and 1+ on the left side. 5/5 strength BLE Sensation intact

## 2014-01-04 NOTE — Patient Instructions (Signed)
We will call with X-ray results. Start prednisone taper.  Stop ibuprofen. Can use hydrocodone  for breakthough pain.

## 2014-01-04 NOTE — Progress Notes (Signed)
Pre visit review using our clinic review tool, if applicable. No additional management support is needed unless otherwise documented below in the visit note. 

## 2014-01-05 ENCOUNTER — Telehealth: Payer: Self-pay | Admitting: Family Medicine

## 2014-01-05 DIAGNOSIS — IMO0001 Reserved for inherently not codable concepts without codable children: Secondary | ICD-10-CM

## 2014-01-05 NOTE — Telephone Encounter (Signed)
Message copied by Jinny Sanders on Fri Jan 05, 2014 11:16 AM ------      Message from: Carter Kitten      Created: Fri Jan 05, 2014  9:05 AM       Mr. Smolenski notified as instructed by telephone.  He would like to go ahead with the MRI.  Prefers a Saturday appointment or a late afternoon after 4 pm so he doesn't have to miss work. ------

## 2014-01-11 ENCOUNTER — Ambulatory Visit
Admission: RE | Admit: 2014-01-11 | Discharge: 2014-01-11 | Disposition: A | Discharge status: Home / Self Care | Payer: 59 | Source: Ambulatory Visit | Attending: Family Medicine | Admitting: Family Medicine

## 2014-01-11 DIAGNOSIS — IMO0001 Reserved for inherently not codable concepts without codable children: Secondary | ICD-10-CM

## 2014-01-12 ENCOUNTER — Telehealth: Payer: Self-pay | Admitting: Family Medicine

## 2014-01-12 ENCOUNTER — Telehealth: Payer: Self-pay | Admitting: *Deleted

## 2014-01-12 DIAGNOSIS — IMO0001 Reserved for inherently not codable concepts without codable children: Secondary | ICD-10-CM

## 2014-01-12 NOTE — Telephone Encounter (Signed)
Error

## 2014-01-12 NOTE — Addendum Note (Signed)
Addended by: Carter Kitten on: 01/12/2014 04:39 PM   Modules accepted: Orders

## 2014-01-12 NOTE — Telephone Encounter (Signed)
Message copied by Jinny Sanders on Fri Jan 12, 2014  1:13 PM ------      Message from: Carter Kitten      Created: Fri Jan 12, 2014 11:41 AM       Mr. Casasola notified as instructed by telephone.  He would like to try the steroid injection first before going back to neurosurgery.  Requesting a late afternoon appointment when this is scheduled for him. ------

## 2014-01-12 NOTE — Telephone Encounter (Signed)
Mr. Kulish left voicemail. He states after he spoke with his wife, they decided to not do the steroid injection and would like to go ahead with the referral to neurosurgeon.  Will forward to Dr. Diona Browner to order referral.

## 2014-02-01 ENCOUNTER — Telehealth: Payer: Self-pay

## 2014-02-01 NOTE — Telephone Encounter (Signed)
Pt has upcoming neuro appt and wants copy of MRI disc; pt had done at [facility] imaging, pt will contact them for disc.

## 2014-12-21 NOTE — Discharge Summary (Signed)
PATIENT NAME:  Isaiah Rangel, Isaiah Rangel MR#:  [id number] DATE OF BIRTH:  [birth year]  DATE OF ADMISSION:  03/28/2013 DATE OF DISCHARGE:  03/29/2013  DISCHARGE DIAGNOSES:   1.  Chest pain, unlikely cardiac with negative cardiac enzymes. Myoview had a lot of artifact so was considered indeterminate at this time, but as the patient's symptoms are better, he wants to hold off for any further testing including cardiac cath while he is here in the hospital and he would like to follow up with cardiology as an outpatient. Cardiology is also in agreement for the same. The patient did not have any more chest pain while in the hospital.  2.  Dizziness, which is improving. Could be from viral disease. He did have extensive cardiac and neurological work-up, which is all essentially negative.   SECONDARY DIAGNOSES: 1.  History of gastroesophageal reflux disease.  2.  Hyperlipidemia.  3.  Benign prostatic hypertrophy.   CONSULTATIONS:  1.  Cardiology, Dr. Rockey Situ.  2.  Physical therapy.   PROCEDURES AND RADIOLOGY:  1.  Myoview on 30th of July was essentially indeterminate considering a lot of artifact, although he did not have any major ST-T changes.  2.  A 2-D echocardiogram on 30th of July showed normal LV systolic function, mild tricuspid regurgitation, normal RV systolic pressure, overall normal study.  3.  Bilateral carotid Dopplers on 30th of July showed no evidence of hemodynamically significant stenosis.  4.  MRI of the brain without contrast showed no significant abnormality.  5.  Chest x-ray on admission was negative for any acute cardiopulmonary disease.   HISTORY AND SHORT HOSPITAL COURSE: The patient is a 54 year old male with above-mentioned medical problems who was admitted for chest pain, was ruled out with 2 negative sets of cardiac enzymes. He did have some hypokalemia, which was repleted and resolved. He had a normal TSH. Cardiology consultation was obtained with Dr. Rockey Situ considering his symptoms  of dizziness. He recommended Myoview, which was performed. Was showing a lot of artifact and was considered indeterminate study. Overall did not have any acute ST-T changes on there, but the patient's symptoms had resolved and after discussion with cardiology and patient, decision was made to discharge him for outpatient followup with cardiology. Considering his dizziness, he did undergo neurological work-up including MRI of the brain, carotid Dopplers, which were also negative. The patient was feeling much better and was discharged home in stable condition.   PHYSICAL EXAMINATION: VITAL SIGNS: On the date of discharge, his temperature was 97.6, heart rate 70 per minute, respirations 18 per minute, blood pressure 117/82 mmHg. His orthostatic vitals were negative in the hospital. His oxygen saturation was 96% on room air.  CARDIOVASCULAR: S1, S2 normal. No murmurs, rubs or gallop.  LUNGS: Clear to auscultation bilaterally. No wheezing, rales, rhonchi or crepitation.  ABDOMEN: Soft, benign.  NEUROLOGIC: Nonfocal examination.   All other physical examination remained at baseline.   The patient was also evaluated by physical therapy and he did fairly well. Did not meet any skilled therapy needs at home   DISCHARGE MEDICATIONS: 1.  Omeprazole 20 mg p.o. daily. 2.  Aspirin 81 mg p.o. daily.  3.  Fish oil 1000 mg p.o. daily.  4.  One-A-Day multivitamin once daily.  5.  Atorvastatin 40 mg p.o. daily.   DISCHARGE DIET: Regular.   DISCHARGE ACTIVITY: As tolerated.   DISCHARGE INSTRUCTIONS AND FOLLOWUP: The patient was instructed to follow up with his primary care physician, Dr. Vernie Murders, in 2 to 4  weeks. He will need follow-up with Dr. Rockey Situ early next week, hopefully by Monday or Tuesday. He will need followup with [facility], Dr. Kathyrn Sheriff, in 2 to 4 weeks if his symptoms continue, mainly in terms of dizziness and vertigo.   TOTAL TIME DISCHARGING THIS PATIENT: 55  minutes.   ____________________________ Lucina Mellow. Manuella Ghazi, MD vss:jm D: 03/29/2013 17:35:23 ET T: 03/29/2013 20:23:27 ET JOB#: [id number]  cc: Jahara Dail S. Manuella Ghazi, MD, <Dictator> Vickki Muff. Chrismon, PA Minna Merritts, MD [place] [place], MD  [place] MD ELECTRONICALLY SIGNED 03/30/2013 9:20

## 2014-12-21 NOTE — Consult Note (Signed)
General Aspect Isaiah Rangel is a 54yo male w/ PMHx s/f tobacco abuse, family h/o premature CAD, HLD, GERD, anxiety, BPH, hemorrhagic renal cyst and history of cervical pain s/p fusion who was admitted to [facility] yesterday for chest pain.   He underwent work-up at [facility] for chest pain in 02/2010. D-dimer was mildly elevated. CT-A chest w/o evidence of PE. Stress echo revealed no evidence of ischemia on echo or EKG. HR increased to 150 with exercise. Last seen by Dr. Rockey Situ in 09/2011. He was doing well at that time. Denied exertional chest pain or dyspnea. No ischemic EKG changes. LDL 139 in 01/2013. On fish oil alone.   He has been having intermittent dizziness for 1 month both when standing and at rest. He reports experiencing intermittent left-sided chest heaviness radiating to his left arm on Sunday and yesterday. Unrelieved by ASA. He has had dizziness. Denied diaphoresis or shortness of breath. He made an appointment with his PCP, but was late and presented to urgent care where he was further referred to the ED.   Present Illness There, EKG revealed no ischemic changes. Initial trop-I WNL. CBC- WBC 11K, otherwise unremarkable. BMET- K 3.3, otherwise unremarkable. He was admitted by the medicine service. Started on low-dose ASA, NTG SL PRN. Two subsequent sets of cardiac biomarkers have returned WNL overnight. TSH WNL. MRI brain and doppler carotids pending for dizziness. [place] arranged.   PAST MEDICAL HISTORY: Significant for GERD, hyperlipidemia, history of BPH. Past medical history also includes history of stress test 2 years back with Dr. Rockey Situ. Reportedly it was normal. The patient went for stress test because of elevated D-dimer.   ALLERGIES: No known allergies.   SOCIAL HISTORY: Smokes 1-1/2 packs per day. He is trying Wellbutrin to quit smoking, but it is not helping yet. No drugs. The patient drinks occasionally. He is working at [employer]. Lives with wife.    PAST SURGICAL HISTORY: None.   FAMILY HISTORY: Strong family history of coronary artery disease. One brother died at the age of 53 because of CAD. A second brother, a  54 year old, had coronary artery disease with stent placement. Both mother and father had heart disease.   MEDICATIONS: Aspirin 81 mg daily, fish oil 1 gram p.o. daily, omeprazole 20 mg p.o. daily, multivitamins/One-A-Day 1 tablet daily, Flomax 0.4 mg p.o. daily, Wellbutrin 150 mg p.o. daily.   Physical Exam:  GEN well developed   HEENT hearing intact to voice, moist oral mucosa   RESP normal resp effort  clear BS    CARD Regular rate and rhythm  No murmur    ABD denies tenderness  soft    LYMPH negative neck   EXTR negative edema   SKIN normal to palpation   NEURO motor/sensory function intact   PSYCH alert, A+O to time, place, person, good insight   Review of Systems:  Subjective/Chief Complaint Dizziness/spinning while supine, sitting,standing,  lethargy, temper per the wife Chest pain Sunday and yesterday   General: No Complaints    Skin: No Complaints    ENT: No Complaints    Eyes: No Complaints    Neck: No Complaints    Respiratory: No Complaints    Cardiovascular: Chest pain or discomfort    Gastrointestinal: No Complaints    Genitourinary: No Complaints    Vascular: No Complaints    Musculoskeletal: No Complaints    Neurologic: Dizzness    Hematologic: No Complaints    Endocrine: No Complaints    Psychiatric:  No Complaints    Review of Systems: All other systems were reviewed and found to be negative    Medications/Allergies Reviewed Medications/Allergies reviewed    Lab Results:  Thyroid:  30-Jul-14 00:20   Thyroid Stimulating Hormone 2.66 (0.45-4.50 (International Unit)  ----------------------- Pregnant patients have  different reference  ranges for TSH:  - - - - - - - - - -  Pregnant, first trimetser:  0.36 - 2.50 uIU/mL)  Routine Chem:  30-Jul-14 00:20    Glucose, Serum  130  BUN 16  Creatinine (comp) 1.13  Sodium, Serum 142  Potassium, Serum 3.6  Chloride, Serum  110  CO2, Serum 29  Calcium (Total), Serum 8.5  Anion Gap  3  Osmolality (calc) 286  eGFR (African American) >60  eGFR (Non-African American) >60 (eGFR values <21m/min/1.73 m2 may be an indication of chronic kidney disease (CKD). Calculated eGFR is useful in patients with stable renal function. The eGFR calculation will not be reliable in acutely ill patients when serum creatinine is changing rapidly. It is not useful in  patients on dialysis. The eGFR calculation may not be applicable to patients at the low and high extremes of body sizes, pregnant women, and vegetarians.)  Cardiac:  29-Jul-14 15:57   CPK-MB, Serum 0.6 (Result(s) reported on 28 Mar 2013 at 10:03PM.)  Troponin I < 0.02 (0.00-0.05 0.05 ng/mL or less: NEGATIVE  Repeat testing in 3-6 hrs  if clinically indicated. >0.05 ng/mL: POTENTIAL  MYOCARDIAL INJURY. Repeat  testing in 3-6 hrs if  clinically indicated. NOTE: An increase or decrease  of 30% or more on serial  testing suggests a  clinically important change)  30-Jul-14 00:20   CPK-MB, Serum 0.6 (Result(s) reported on 29 Mar 2013 at 01:19AM.)  Troponin I < 0.02 (0.00-0.05 0.05 ng/mL or less: NEGATIVE  Repeat testing in 3-6 hrs  if clinically indicated. >0.05 ng/mL: POTENTIAL  MYOCARDIAL INJURY. Repeat  testing in 3-6 hrs if  clinically indicated. NOTE: An increase or decrease  of 30% or more on serial  testing suggests a  clinically important change)    07:59   CPK-MB, Serum 0.9 (Result(s) reported on 29 Mar 2013 at 08:51AM.)  Troponin I < 0.02 (0.00-0.05 0.05 ng/mL or less: NEGATIVE  Repeat testing in 3-6 hrs  if clinically indicated. >0.05 ng/mL: POTENTIAL  MYOCARDIAL INJURY. Repeat  testing in 3-6 hrs if  clinically indicated. NOTE: An increase or decrease  of 30% or more on serial  testing suggests a  clinically important  change)  Routine Hem:  30-Jul-14 00:20   WBC (CBC) 8.4  RBC (CBC)  4.14  Hemoglobin (CBC) 13.5  Hematocrit (CBC)  38.3  Platelet Count (CBC) 218  MCV 93  MCH 32.6  MCHC 35.2  RDW 14.5  Neutrophil % 46.8  Lymphocyte % 39.9  Monocyte % 8.1  Eosinophil % 4.3  Basophil % 0.9  Neutrophil # 3.9  Lymphocyte # 3.4  Monocyte # 0.7  Eosinophil # 0.4  Basophil # 0.1 (Result(s) reported on 29 Mar 2013 at 01:08AM.)   EKG:  Interpretation EKG   Radiology Results:  XRay:    29-Jul-14 16:02, Chest PA and Lateral  Chest PA and Lateral   REASON FOR EXAM:    Chest Pain  COMMENTS:       PROCEDURE: DXR - DXR CHEST PA (OR AP) AND LATERAL  - Mar 28 2013  4:02PM     RESULT: The patient has no previous exam for comparison.    The lungs  are clear. The heart and pulmonary vessels are normal. The bony   and mediastinal structures are unremarkable. There is no effusion. There   is no pneumothorax or evidence of congestive failure.    IMPRESSION:  No acute cardiopulmonary disease.    Dictation Site: 2    Verified By: Sundra Aland, M.D., MD    No Known Allergies:   Vital Signs/Nurse's Notes: **Vital Signs.:   30-Jul-14 08:17  Vital Signs Type Routine  Temperature Temperature (F) 97.6  Celsius 36.4  Temperature Source oral  Pulse Pulse 70  Respirations Respirations 18  Systolic BP Systolic BP 343  Diastolic BP (mmHg) Diastolic BP (mmHg) 82  Mean BP 93  Pulse Ox % Pulse Ox % 96  Oxygen Delivery Room Air/ 21 %    Impression 54yo male w/ PMHx s/f tobacco abuse, family h/o premature CAD, HLD, GERD, anxiety, BPH, hemorrhagic renal cyst and history of cervical pain s/p fusion who was admitted to [facility] yesterday for chest pain.   1. Chest pain,  moderate risk of cardiac etiology  Cardiac risk factors include strong family history of premature CAD, HLD (not on statin) and ongoing tobacco abuse. Objectively, EKG and cardiac biomarkers indicate no acute evidence of ischemia.  Currently chest pain free. -- Continue low-dose ASA, NTG SL PRN  lipids ordered -- Add low-dose metoprolol tartrate -- Stress test with Myoview imaging to assess for ischemia  2. Hyperlipidemia Currently on fish oil. LDL 139. Given significant cardiac risk factors, suspect high risk for ASCVD in 10 years. Repeat lipids here -- Consider Starting statin   3. Ongoing tobacco abuse -- NRT in the form of nicotine patch taper -- Consider e-cigarettes to wean mechanical habituation -- Dizzy on Chantix in the past  4) Labile modd Per teh wife. Discussed with wife about PMD starting SSRI, as requested by teh wife.  5) Dizziness: supine, sitting and standing Need to check orthostatics, on tele stress test pending carotid u/s MRI head pending   Electronic Signatures: Meriel Pica (PA-C)  (Signed 30-Jul-14 10:11)  Authored: General Aspect/Present Illness, Home Medications, Labs, Allergies, Vital Signs/Nurse's Notes, Impression/Plan Ida Rogue (MD)  (Signed 30-Jul-14 10:32)  Authored: General Aspect/Present Illness, History and Physical Exam, Review of System, Past Medical History, Labs, EKG , Radiology, Impression/Plan  Co-Signer: General Aspect/Present Illness, Home Medications, Labs, Allergies, Vital Signs/Nurse's Notes, Impression/Plan   Last Updated: 30-Jul-14 10:32 by Ida Rogue (MD)

## 2014-12-21 NOTE — H&P (Signed)
PATIENT NAME:  Isaiah Rangel, Isaiah Rangel MR#:  [id number] DATE OF BIRTH:  [birth year]  DATE OF ADMISSION:  03/28/2013  PRIMARY CARE PHYSICIAN: Vickki Muff. Chrismon, PA   CHIEF COMPLAINT: Chest pain.   HISTORY OF PRESENT ILLNESS: The patient is a 54 year old male with strong family history of coronary artery disease who came in because of chest pain. The patient noted to have left-sided chest pressure/heaviness on Sunday, radiated to the left arm. The patient felt dizzy at that time. The patient's wife gave him aspirin and the chest pain went away on Sunday. Today, the patient went to see his primary doctor but he was late for the appointment, so he was sent to urgent care clinic. From there, he was sent to the Emergency Room because of his chest pain. The patient's chest pain resolved today but when it came on Sunday, it was left-sided, not radiating to the back but radiating to the shoulder, and relieved with some aspirin. No shortness of breath. No diaphoresis. The patient feels very weak and tired at this time.   PAST MEDICAL HISTORY: Significant for GERD, hyperlipidemia, history of BPH. Past medical history also includes history of stress test 2 years back with Dr. Rockey Situ. Reportedly it was normal. The patient went for stress test because of elevated D-dimer.   ALLERGIES: No known allergies.   SOCIAL HISTORY: Smokes 1-1/2 packs per day. He is trying Wellbutrin to quit smoking, but it is not helping yet. No drugs. The patient drinks occasionally. He is working at [employer]. Lives with wife.   PAST SURGICAL HISTORY: None.   FAMILY HISTORY: Strong family history of coronary artery disease. One brother died at the age of 66 because of  heart  disease. A second brother, a  39 year old, had coronary artery disease with stent placement. Both mother and father had heart disease.   MEDICATIONS: Aspirin 81 mg daily, fish oil 1 gram p.o. daily, omeprazole 20 mg p.o. daily, multivitamins/One-A-Day 1  tablet daily, Flomax 0.4 mg p.o. daily, Wellbutrin 150 mg p.o. daily.   REVIEW OF SYSTEMS:    CONSTITUTIONAL: Has no fever but feels very weak.  EYES: No blurred vision.  EARS, NOSE, THROAT: No tinnitus. No epistaxis. No difficulty swallowing.  RESPIRATORY: No cough. No wheezing.  CARDIOVASCULAR: Had chest pain on Sunday. No orthopnea. No pedal edema. No palpitations. No syncope.  GASTROINTESTINAL: No nausea. No vomiting. No abdominal pain. No diarrhea.  GENITOURINARY: No dysuria.  ENDOCRINE: No polyuria or nocturia.  INTEGUMENTARY: No skin rashes.  MUSCULOSKELETAL: No joint pain.  NEUROLOGICAL: No numbness or weakness.  PSYCHIATRIC: No anxiety or insomnia.   PHYSICAL EXAMINATION:  VITAL SIGNS: Temperature is 98 Fahrenheit, heart rate 82, blood pressure 111/73, sats 96% on room air. GENERAL: Alert, awake, oriented, well-developed, well-nourished male, not in distress, answering questions appropriately.  HEENT: Head atraumatic, normocephalic. Pupils equally reacting to light. Extraocular movements are intact. Nose: No turbinate hypertrophy. Ears: No tympanic membrane congestion. Mouth: No oral lesions.  NECK: Supple. No JVD. No carotid bruit. RESPIRATORY: Clear to auscultation. No wheeze noted.  CARDIOVASCULAR: S1, S2 regular. No murmurs. The patient's PMI is not displaced. Good pedal pulses and femoral pulse  No peripheral edema.  GASTROINTESTINAL: Abdomen is soft, nontender, nondistended. Bowel sounds present.  MUSCULOSKELETAL: Normal gait and station. The patient has no tenderness or effusion.  NEUROLOGICAL: Cranial nerves II through XII are intact. Power 5 out of 5 in upper and lower extremities. Sensation is intact. DTRs 2+ bilaterally. Alert, awake, oriented. No  focal neurological deficit.   LABORATORY AND DIAGNOSTIC DATA: WBC 11, hemoglobin 13.6, hematocrit 38.5, platelets 211. Electrolytes: Sodium is 139, potassium 3.3, chloride 107, bicarbonate 28, BUN 14, creatinine 1.11, glucose  108. Troponin less than 0.02. EKG shows normal sinus rhythm with no ST-T changes, 83 beats per minute.   ASSESSMENT AND PLAN: This patient is a 54 year old male with chest pain, normal EKG, negative troponins, but significant family history of heart disease. He is at high risk for getting premature heart disease with his LDL being high on multiple occasions. The patient right now is going to be admitted to telemetry on observation status. Continue him on aspirin, beta blockers, statins, nitroglycerin, Lovenox. Stress test tomorrow morning.   The patient had an LDL and physical exam done last month and reportedly LDL is high. The patient's wife wanted to try only fish oil. Right now, he is only on fish oil. I asked the wife to get his blood work report and see, instead of repeating it again. The patient may have to go on statins.   History of gastroesophageal reflux disease: Continue proton pump inhibitors.   Benign prostatic hypertrophy: Continue Flomax.   Tobacco abuse: Counseled for 3 to 10 minutes. The patient trying to quit. He is on Wellbutrin. Continue that.   Dizziness: The patient is not anemic. The patient has been seen by [facility] N[facility] Thro[facility]. Tried meclizine for positional vertigo. Right now, he feels more dizzy when the chest pain happens, so will wait for the stress test and cardiology work-up and if the patient still has dizziness, he will need Ear, Nose and Throat followup for further testing.  TIME SPENT: About 55 minutes.    ____________________________ Epifanio Lesches, MD sk:jm D: 03/28/2013 17:34:09 ET T: 03/28/2013 17:58:21 ET JOB#: [id number]  cc: Epifanio Lesches, MD, <Dictator> Epifanio Lesches MD ELECTRONICALLY SIGNED 04/18/2013 15:59

## 2015-04-10 ENCOUNTER — Other Ambulatory Visit: Payer: Self-pay

## 2015-04-10 ENCOUNTER — Emergency Department: Payer: 59

## 2015-04-10 ENCOUNTER — Emergency Department
Admission: EM | Admit: 2015-04-10 | Discharge: 2015-04-10 | Disposition: A | Discharge status: Home / Self Care | Payer: 59 | Attending: Emergency Medicine | Admitting: Emergency Medicine

## 2015-04-10 ENCOUNTER — Telehealth: Payer: Self-pay | Admitting: Cardiovascular Disease

## 2015-04-10 DIAGNOSIS — R079 Chest pain, unspecified: Secondary | ICD-10-CM

## 2015-04-10 DIAGNOSIS — Z7982 Long term (current) use of aspirin: Secondary | ICD-10-CM | POA: Insufficient documentation

## 2015-04-10 DIAGNOSIS — R61 Generalized hyperhidrosis: Secondary | ICD-10-CM | POA: Insufficient documentation

## 2015-04-10 DIAGNOSIS — Z79899 Other long term (current) drug therapy: Secondary | ICD-10-CM | POA: Diagnosis not present

## 2015-04-10 DIAGNOSIS — R112 Nausea with vomiting, unspecified: Secondary | ICD-10-CM | POA: Insufficient documentation

## 2015-04-10 DIAGNOSIS — Z720 Tobacco use: Secondary | ICD-10-CM | POA: Insufficient documentation

## 2015-04-10 LAB — BASIC METABOLIC PANEL
Anion gap: 7 (ref 5–15)
BUN: 15 mg/dL (ref 6–20)
CO2: 27 mmol/L (ref 22–32)
Calcium: 9.2 mg/dL (ref 8.9–10.3)
Chloride: 108 mmol/L (ref 101–111)
Creatinine, Ser: 0.97 mg/dL (ref 0.61–1.24)
GFR calc Af Amer: >60 mL/min (ref >60)
GFR calc non Af Amer: >60 mL/min (ref >60)
Glucose, Bld: 121 mg/dL — ABNORMAL HIGH (ref 65–99)
Potassium: 4.1 mmol/L (ref 3.5–5.1)
Sodium: 142 mmol/L (ref 135–145)

## 2015-04-10 LAB — CBC
HCT: 43.7 % (ref 40.0–52.0)
Hemoglobin: 14.7 g/dL (ref 13.0–18.0)
MCH: 31.3 pg (ref 26.0–34.0)
MCHC: 33.6 g/dL (ref 32.0–36.0)
MCV: 93.1 fL (ref 80.0–100.0)
Platelets: 214 10*3/uL (ref 150–440)
RBC: 4.69 MIL/uL (ref 4.40–5.90)
RDW: 14.9 % — ABNORMAL HIGH (ref 11.5–14.5)
WBC: 9.3 10*3/uL (ref 3.8–10.6)

## 2015-04-10 LAB — TROPONIN I
Troponin I: <0.03 ng/mL (ref <0.031)
Troponin I: <0.03 ng/mL (ref <0.031)

## 2015-04-10 NOTE — Telephone Encounter (Signed)
Sx of nausea, emesis, chest pain in the ER today Can we try to get him in for close follow up this week. thx Esmond Plants

## 2015-04-10 NOTE — ED Notes (Signed)
Patient presents to ED with mid chest pain x 3 days, per wife patient woke up diaphoretic and nauseous. Patient reports took tums during the past couple of days without relief. (+) dizziness and shortness of breath. Patient reports pain radiated down left arm this morning, when asked to describe pain, patient states "its just different...its painful, I just can't describe it." Patient denies abdominal pain or fevers at home. Patient alert and oriented x 4, respirations even and unlabored, patient speaking in complete sentences, call bell within reach, family at bedside.

## 2015-04-10 NOTE — ED Notes (Addendum)
Pt to room 16 via w/c with no distress noted, holding mug of coffee; reports mid CP x 3 days accomp by SOB; st hx of same with dx acid reflux; pt instructed not to drink until evaluated by MD

## 2015-04-10 NOTE — Discharge Instructions (Signed)
No certain cause was found for your symptoms of chest pain, nausea, and dizziness, however your exam and evaluation are reassuring today.  Return to the emergency department for any worsening condition including any confusion or altered mental status, weakness, dizziness and passing out, fever, chest pain, trouble breathing, or any other symptoms concerning to you. Patient drinking plenty of fluids especially if you're outside in the heat.   Chest Pain (Nonspecific) It is often hard to give a specific diagnosis for the cause of chest pain. There is always a chance that your pain could be related to something serious, such as a heart attack or a blood clot in the lungs. You need to follow up with your health care provider for further evaluation. CAUSES   Heartburn.  Pneumonia or bronchitis.  Anxiety or stress.  Inflammation around your heart (pericarditis) or lung (pleuritis or pleurisy).  A blood clot in the lung.  A collapsed lung (pneumothorax). It can develop suddenly on its own (spontaneous pneumothorax) or from trauma to the chest.  Shingles infection (herpes zoster virus). The chest wall is composed of bones, muscles, and cartilage. Any of these can be the source of the pain.  The bones can be bruised by injury.  The muscles or cartilage can be strained by coughing or overwork.  The cartilage can be affected by inflammation and become sore (costochondritis). DIAGNOSIS  Lab tests or other studies may be needed to find the cause of your pain. Your health care provider may have you take a test called an ambulatory electrocardiogram (ECG). An ECG records your heartbeat patterns over a 24-hour period. You may also have other tests, such as:  Transthoracic echocardiogram (TTE). During echocardiography, sound waves are used to evaluate how blood flows through your heart.  Transesophageal echocardiogram (TEE).  Cardiac monitoring. This allows your health care provider to monitor  your heart rate and rhythm in real time.  Holter monitor. This is a portable device that records your heartbeat and can help diagnose heart arrhythmias. It allows your health care provider to track your heart activity for several days, if needed.  Stress tests by exercise or by giving medicine that makes the heart beat faster. TREATMENT   Treatment depends on what may be causing your chest pain. Treatment may include:  Acid blockers for heartburn.  Anti-inflammatory medicine.  Pain medicine for inflammatory conditions.  Antibiotics if an infection is present.  You may be advised to change lifestyle habits. This includes stopping smoking and avoiding alcohol, caffeine, and chocolate.  You may be advised to keep your head raised (elevated) when sleeping. This reduces the chance of acid going backward from your stomach into your esophagus. Most of the time, nonspecific chest pain will improve within 2-3 days with rest and mild pain medicine.  HOME CARE INSTRUCTIONS   If antibiotics were prescribed, take them as directed. Finish them even if you start to feel better.  For the next few days, avoid physical activities that bring on chest pain. Continue physical activities as directed.  Do not use any tobacco products, including cigarettes, chewing tobacco, or electronic cigarettes.  Avoid drinking alcohol.  Only take medicine as directed by your health care provider.  Follow your health care provider's suggestions for further testing if your chest pain does not go away.  Keep any follow-up appointments you made. If you do not go to an appointment, you could develop lasting (chronic) problems with pain. If there is any problem keeping an appointment, call to reschedule.  SEEK MEDICAL CARE IF:   Your chest pain does not go away, even after treatment.  You have a rash with blisters on your chest.  You have a fever. SEEK IMMEDIATE MEDICAL CARE IF:   You have increased chest pain or  pain that spreads to your arm, neck, jaw, back, or abdomen.  You have shortness of breath.  You have an increasing cough, or you cough up blood.  You have severe back or abdominal pain.  You feel nauseous or vomit.  You have severe weakness.  You faint.  You have chills. This is an emergency. Do not wait to see if the pain will go away. Get medical help at once. Call your local emergency services (911 in [place] Do not drive yourself to the hospital. MAKE SURE YOU:   Understand these instructions.  Will watch your condition.  Will get help right away if you are not doing well or get worse. Document Released: 05/27/2005 Document Revised: 08/22/2013 Document Reviewed: 03/22/2008 [facility] Patient Information 2015 [place], [place]. This information is not intended to replace advice given to you by your health care provider. Make sure you discuss any questions you have with your health care provider.

## 2015-04-10 NOTE — Telephone Encounter (Signed)
Per Dr. Rockey Situ, see if we can squeeze pt in this week w/ Thurmond Butts.

## 2015-04-10 NOTE — ED Provider Notes (Signed)
[facility] Emergency Department Provider Note   ____________________________________________  Time seen: 8:15 AM I have reviewed the triage vital signs and the triage nursing note.  HISTORY  Chief Complaint Chest Pain   Historian Patient and spouse  HPI Isaiah Rangel is a 54 y.o. male who is presenting for an episode of chest pressure located centrally occur this morning upon waking and lasted about 30-45 minutes before easing off on its own. He felt nauseated and vomited once, had extension up into the left shoulder, and was diaphoretic. An episode similar to this happen last night. He does work outside in the heat and has occasionally had issues with dehydration. He's had a history of vertigo in the past, however he states this is not like vertigo. He's had a history with acid reflux indigestion, and has recently weaned all weight off of omeprazole. He had a bland meal last night. He states that this episode did not necessarily feel like indigestion. He's had a history of panic attack years ago, but this did not feel like a panic attack to him. He has been under increased stress recently including a family member being in the hospital. Symptoms are now resolved. He has followed with a cardiologist and had a catheterization in 2014 which showed minor luminal irregularities without any significant coronary artery disease. The patient is still a smoker. No known cardiac arrhythmias. Occasional shortness of breath that is worse during the heat and humidity.   Past Medical History  Diagnosis Date  . OA (osteoarthritis)   . Current smoker   . GERD (gastroesophageal reflux disease)   . Anxiety   . BPH (benign prostatic hyperplasia)     Patient Active Problem List   Diagnosis Date Noted  . Radicular pain of right lower back 12/05/2013  . S/P cardiac catheterization 04/19/2013  . Benign paroxysmal positional vertigo 02/04/2013  . Smoking 09/09/2011  .  HYPERLIPIDEMIA 04/09/2010  . GERD 09/21/2008  . ERECTILE DYSFUNCTION 09/20/2008  . FATIGUE 09/20/2008    Past Surgical History  Procedure Laterality Date  . Back surgery    . Neck fusion    . Nasal sinus surgery    . Knee arthroscopy w/ partial medial meniscectomy Right 2012    [place], 2012  . Vasectomy    . Cardiac catheterization  04/06/13    [facility]- minor luminal irregularities, otherwise normal cors; EF > 55%. Medical management and smoking cessation recommended    Current Outpatient Rx  Name  Route  Sig  Dispense  Refill  . aspirin 81 MG chewable tablet   Oral   Chew 81 mg by mouth daily.           . Multiple Vitamin (MULTIVITAMIN) tablet   Oral   Take 1 tablet by mouth daily.           . ranitidine (ZANTAC) 150 MG tablet   Oral   Take 150 mg by mouth 2 (two) times daily as needed for heartburn.         Marland Kitchen acetaminophen (TYLENOL) 500 MG tablet   Oral   Take 1,000 mg by mouth every 6 (six) hours as needed.           Marland Kitchen HYDROcodone-acetaminophen (NORCO) 5-325 MG per tablet   Oral   Take 1 tablet by mouth every 6 (six) hours as needed for moderate pain. Patient not taking: Reported on 04/10/2015   30 tablet   0   . ibuprofen (ADVIL,MOTRIN) 200 MG tablet  Oral   Take 400 mg by mouth every 6 (six) hours as needed.           Marland Kitchen omeprazole (PRILOSEC) 20 MG capsule   Oral   Take 1 capsule (20 mg total) by mouth daily. Patient not taking: Reported on 04/10/2015   30 capsule   6   . predniSONE (DELTASONE) 20 MG tablet      Take two pills daily for 4 days followed by one pill daily for 4 days Patient not taking: Reported on 04/10/2015   12 tablet   0   . terazosin (HYTRIN) 1 MG capsule      TAKE 1 CAPSULE BY MOUTH EVERY NIGHT AT BEDTIME Patient not taking: Reported on 04/10/2015   30 capsule   11     Allergies Review of patient's allergies indicates no known allergies.  Family History  Problem Relation Age of Onset  . Arthritis Father   . Coronary  artery disease Father   . Hypertension Father   . Coronary artery disease Brother 97  . Colon cancer Mother   . Esophageal cancer Neg Hx   . Stomach cancer Neg Hx   . Heart attack Brother   . Heart attack Brother     Social History Social History  Substance Use Topics  . Smoking status: Current Every Day Smoker -- 1.50 packs/day for 30 years    Types: Cigarettes  . Smokeless tobacco: Never Used  . Alcohol Use: No    Review of Systems  Constitutional: Negative for fever. Eyes: Negative for visual changes. ENT: Negative for sore throat. Cardiovascular: Negative for palpitations. Respiratory: Negative for pleuritic chest pain. No cough. Gastrointestinal: Negative for abdominal pain, vomiting and diarrhea. Genitourinary: Negative for dysuria. Musculoskeletal: Negative for back pain. Skin: Negative for rash. Neurological: Negative for headaches, focal weakness or numbness. 10 point Review of Systems otherwise negative ____________________________________________   PHYSICAL EXAM:  VITAL SIGNS: ED Triage Vitals  Enc Vitals Group     BP 04/10/15 0648 108/67 mmHg     Pulse Rate 04/10/15 0648 58     Resp 04/10/15 0648 18     Temp 04/10/15 0648 98 F (36.7 C)     Temp Source 04/10/15 0648 Oral     SpO2 04/10/15 0648 98 %     Weight 04/10/15 0648 145 lb (65.772 kg)     Height 04/10/15 0648 5\' 5"  (1.651 m)     Head Cir --      Peak Flow --      Pain Score 04/10/15 0647 5     Pain Loc --      Pain Edu? --      Excl. in [place]? --      Constitutional: Alert and oriented. Well appearing and in no distress. Eyes: Conjunctivae are normal. PERRL. Normal extraocular movements. ENT   Head: Normocephalic and atraumatic.   Nose: No congestion/rhinnorhea.   Mouth/Throat: Mucous membranes are moist.   Neck: No stridor. Cardiovascular/Chest: Normal rate, regular rhythm.  No murmurs, rubs, or gallops. Respiratory: Normal respiratory effort without tachypnea nor  retractions. Breath sounds are clear and equal bilaterally. No wheezes/rales/rhonchi. Gastrointestinal: Soft. No distention, no guarding, no rebound. Nontender   Genitourinary/rectal:Deferred Musculoskeletal: Nontender with normal range of motion in all extremities. No joint effusions.  No lower extremity tenderness nor edema. Neurologic:  Normal speech and language. No gross or focal neurologic deficits are appreciated. Skin:  Skin is warm, dry and intact. No rash noted. Psychiatric: Mood and affect  are normal. Speech and behavior are normal. Patient exhibits appropriate insight and judgment.  ____________________________________________   EKG I, Lisa Roca, MD, the attending physician have personally viewed and interpreted all ECGs.  59 bpm. Sinus bradycardia. Narrow QRS. Normal axis. Normal ST and T-wave. ____________________________________________  LABS (pertinent positives/negatives)  Basic metabolic panel without significant abnormality CBC within normal limits Troponin less than 0.03 Troponin less than 0.03  ____________________________________________  RADIOLOGY All Xrays were viewed by me. Imaging interpreted by Radiologist.  Chest x-ray portable: Negative __________________________________________  PROCEDURES  Procedure(s) performed: None Critical Care performed: None  ____________________________________________   ED COURSE / ASSESSMENT AND PLAN  CONSULTATIONS: Phone consultation with cardiologist Dr. Rockey Situ  Pertinent labs & imaging results that were available during my care of the patient were reviewed by me and considered in my medical decision making (see chart for details).  Unclear etiology of patient's episode last night and today. His labs and examination and vital signs are reassuring today. It's possible this is related to an episode acid reflux indigestion, however the symptoms are persistent, and I'm not sure this is the source of his symptoms.  He does want to go back on the omeprazole and I think this is wise at least for 2 weeks to see because of improvement.  Considered vagal episode, especially in light of working outside in the heat and humidity. His labs show no sign of dehydration. I have encouraged him to continue drinking fluids  Consider arrhythmia, although the patient did not notice any irregularity, tachycardia, or bradycardia. His heart rate on EKG is 59. He was experiencing symptoms when he got here and had his EKG started and was placed on a monitor, and he was having no irregular beats or abnormal rate/rhythm. I think an arrhythmia would therefore be unlikely. However I do have a follow-up with cardiologist and consider Holter placement. He does state that the symptoms had eased off some by the time he got here to the emergency department.  In terms of acute coronary syndrome, I think this is unlikely. His EKG and troponin evaluation are negative. His stress test was reviewed from 2014 and showed no significant coronary artery disease. Consider vascular spasm. Patient will be referred to cardiology.  I spoke with the cardiologist and Dr.Gollan's office will call the patient is suctioning to arrange close follow-up in next 1-2 days.  Patient / Family / Caregiver informed of clinical course, medical decision-making process, and agree with plan.   I discussed return precautions, follow-up instructions, and discharged instructions with patient and/or family.  ___________________________________________   FINAL CLINICAL IMPRESSION(S) / ED DIAGNOSES   Final diagnoses:  Chest pain, unspecified chest pain type    FOLLOW UP  Referred to: Dr. Rockey Situ 1-2 days   Lisa Roca, MD 04/10/15 1224

## 2015-04-11 ENCOUNTER — Telehealth: Payer: Self-pay

## 2015-04-11 NOTE — Telephone Encounter (Signed)
l mom to schedule f/u ED . Next available ok, per dr. Rockey Situ

## 2015-04-12 ENCOUNTER — Telehealth: Payer: Self-pay | Admitting: Family Medicine

## 2015-04-12 NOTE — Telephone Encounter (Signed)
Spoke to pts wife and advised that per Dr Diona Browner, pt will need to go to the ED. Wife verbally expressed understanding, and states they are "working on it."

## 2015-04-12 NOTE — Telephone Encounter (Signed)
[facility]  Patient Name: Isaiah Rangel  DOB: 1961-05-10    Initial Comment Caller states her husband has been complaining of dizziness, nausea, difficulty walking.    Nurse Assessment  Nurse: Genoveva Ill, RN, Lattie Haw Date/Time (Eastern Time): 04/12/2015 3:51:10 PM  Confirm and document reason for call. If symptomatic, describe symptoms. ---pt states has been complaining of dizziness, nausea and difficulty walking.  Has the patient traveled out of the country within the last 30 days? ---Not Applicable  Does the patient require triage? ---Yes  Related visit to physician within the last 2 weeks? ---No  Does the PT have any chronic conditions? (i.e. diabetes, asthma, etc.) ---Yes  List chronic conditions. ---acid reflux     Guidelines    Guideline Title Affirmed Question Affirmed Notes  Dizziness - Lightheadedness SEVERE dizziness (e.g., unable to stand, requires support to walk, feels like passing out now)    Final Disposition User   Go to ED Now (or PCP triage) Genoveva Ill, RN, Lattie Haw    Referrals  [place] UNDECIDED   Disagree/Comply: Disagree  Disagree/Comply Reason: Disagree with instructions   **PT REFUSING ER AT THIS TIME AND WANTS TO BE SEEN AT OFFICE, PREFERABLY BY PCP; NO APPTS AVAILABLE AS OFFICE CLOSING IN 1 HR; CALLER STATES PT WILL REFUSE ER RIGHT NOW, BUT IF SX CONTINUE SHE WILL TALK HIM INTO GOING TO ED; REPORTED TO RENA AT OFFICE

## 2015-04-15 ENCOUNTER — Ambulatory Visit (INDEPENDENT_AMBULATORY_CARE_PROVIDER_SITE_OTHER): Payer: 59 | Admitting: Family Medicine

## 2015-04-15 ENCOUNTER — Encounter: Payer: Self-pay | Admitting: Family Medicine

## 2015-04-15 VITALS — BP 113/69 | HR 75 | Temp 98.6°F | Ht 65.5 in | Wt 144.5 lb

## 2015-04-15 DIAGNOSIS — R2990 Unspecified symptoms and signs involving the nervous system: Secondary | ICD-10-CM

## 2015-04-15 DIAGNOSIS — R410 Disorientation, unspecified: Secondary | ICD-10-CM

## 2015-04-15 DIAGNOSIS — R51 Headache: Secondary | ICD-10-CM | POA: Diagnosis not present

## 2015-04-15 DIAGNOSIS — R519 Headache, unspecified: Secondary | ICD-10-CM

## 2015-04-15 DIAGNOSIS — R42 Dizziness and giddiness: Secondary | ICD-10-CM

## 2015-04-15 NOTE — Patient Instructions (Signed)

## 2015-04-15 NOTE — Progress Notes (Signed)
Dr. [name] T. Justin Meisenheimer, MD, [facility] [place], [address] Phone: [phone] Fax: [phone]  04/15/2015  Patient: Isaiah Rangel, MRN: [id number], DOB: [birth year], 54 y.o.  Primary Physician:  Owens Loffler, MD  Chief Complaint: Dizziness and Headache  Subjective:   Isaiah Rangel is a 54 y.o. very pleasant male patient who presents with the following:  ER notes are all reviewed. From 04/10/2015. Normal telemetry. Normal EKG with negative troponins with a clean cath in 2014.   Patient presents with dull headache last week that is ongoing. He has no history of headaches, migraines at all. He also last Friday felt like he was going to pass out, and could not walk a straight line lasting hours. 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 This is in a job that he has done for 30 years.   Father had a minor stroke last week. Has had a dull headache last week.  Wed last week, something, severe substernal chest pain.   Was taking his PPI every other day before then.  When standing up, felt like it was going to pass out and could not walk a straight line. Still feels like he is in a tunnel. Feels like he is going to pass out. Laid down for about 30 minutes.   Dull headache across head.  Sat night got comfused somewhat.  Past Medical History, Surgical History, Social History, Family History, Problem List, Medications, and Allergies have been reviewed and updated if relevant.  Patient Active Problem List   Diagnosis Date Noted  . Radicular pain of right lower back 12/05/2013  . S/P cardiac catheterization 04/19/2013  . Benign paroxysmal positional vertigo 02/04/2013  . Smoking 09/09/2011  . HYPERLIPIDEMIA 04/09/2010  . GERD 09/21/2008  . ERECTILE DYSFUNCTION 09/20/2008  . FATIGUE 09/20/2008     Past Medical History  Diagnosis Date  . OA (osteoarthritis)   . Current smoker   . GERD (gastroesophageal reflux disease)   . Anxiety   . BPH (benign prostatic hyperplasia)     Past Surgical History  Procedure Laterality Date  . Back surgery    . Neck fusion    . Nasal sinus surgery    . Knee arthroscopy w/ partial medial meniscectomy Right 2012    [place], 2012  . Vasectomy    . Cardiac catheterization  04/06/13    [facility]- minor luminal irregularities, otherwise normal cors; EF > 55%. Medical management and smoking cessation recommended    Social History   Social History  . Marital Status: Married    Spouse Name: N/A  . Number of Children: N/A  . Years of Education: N/A   Occupational History  . [employer]    Social History Main Topics  . Smoking status: Current Every Day Smoker -- 1.50 packs/day for 30 years    Types: Cigarettes  . Smokeless tobacco: Never Used  . Alcohol Use: No  . Drug Use: No  . Sexual Activity: Not on file   Other Topics Concern  . 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 Lister Kuba([employer])      Likes to work out       Lives with GF    Family History  Problem Relation Age of Onset  . Arthritis Father   .  Coronary artery disease Father   . Hypertension Father   . Coronary artery disease Brother 58  . Colon cancer Mother   . Esophageal cancer Neg Hx   . Stomach cancer Neg Hx   . Heart attack Brother   . Heart attack Brother     No Known Allergies  Medication list reviewed and updated in full in [facility].   GEN: No acute illnesses, no fevers, chills. GI: No n/v/d, eating normally Pulm: No SOB Neuro as above Interactive and getting along well at home.  Otherwise, ROS is as per the HPI.  Objective:   BP 113/69 mmHg  Pulse 75  Temp(Src) 98.6 F (37 C) (Oral)  Ht 5' 5.5" (1.664 m)  Wt 144 lb 8 oz (65.545 kg)  BMI 23.67 kg/m2   GEN: WDWN, NAD, Non-toxic, A & O x 3 HEENT: Atraumatic,  Normocephalic. Neck supple. No masses, No LAD. Ears and Nose: No external deformity. All inducible ear vertigo maneuvers are negative.  CV: RRR, No M/G/R. No JVD. No thrill. No extra heart sounds. PULM: CTA B, no wheezes, crackles, rhonchi. No retractions. No resp. distress. No accessory muscle use. ABD: S, NT, ND, +BS. No rebound tenderness. No HSM.  EXTR: No c/c/e  Neuro: CN 2-12 grossly intact. PERRLA. EOMI. Sensation intact throughout. Str 5/5 all extremities. DTR 2+. No clonus. A and o x 4. Romberg POSITIVE. Additional BESS testing on R foot and tandem standing positive.  Finger nose neg. Heel -shin neg.   PSYCH: Normally interactive. Conversant. Not depressed or anxious appearing.  Calm demeanor.     Laboratory and Imaging Data: Results for orders placed or performed during the hospital encounter of 27/25/36  Basic metabolic panel  Result Value Ref Range   Sodium 142 135 - 145 mmol/L   Potassium 4.1 3.5 - 5.1 mmol/L   Chloride 108 101 - 111 mmol/L   CO2 27 22 - 32 mmol/L   Glucose, Bld 121 (H) 65 - 99 mg/dL   BUN 15 6 - 20 mg/dL   Creatinine, Ser 0.97 0.61 - 1.24 mg/dL   Calcium 9.2 8.9 - 10.3 mg/dL   GFR calc non Af Amer >60 >60 mL/min   GFR calc Af Amer >60 >60 mL/min   Anion gap 7 5 - 15  CBC  Result Value Ref Range   WBC 9.3 3.8 - 10.6 K/uL   RBC 4.69 4.40 - 5.90 MIL/uL   Hemoglobin 14.7 13.0 - 18.0 g/dL   HCT 43.7 40.0 - 52.0 %   MCV 93.1 80.0 - 100.0 fL   MCH 31.3 26.0 - 34.0 pg   MCHC 33.6 32.0 - 36.0 g/dL   RDW 14.9 (H) 11.5 - 14.5 %   Platelets 214 150 - 440 K/uL  Troponin I  Result Value Ref Range   Troponin I <0.03 <0.031 ng/mL  Troponin I  Result Value Ref Range   Troponin I <0.03 <0.031 ng/mL    Dg Chest Portable 1 View  04/10/2015   CLINICAL DATA:  Chest pain and nausea.  Sweats for 3 days.  EXAM: PORTABLE CHEST - 1 VIEW  COMPARISON:  03/28/2013  FINDINGS: Both lungs are clear. Heart and mediastinum are within normal limits. The trachea is midline.  Surgical plate in the lower cervical spine. No acute bone abnormality.  IMPRESSION: No active disease.   Electronically Signed   By: Markus Daft M.D.   On: 04/10/2015 07:21     Assessment and Plan:   Headache, unspecified  headache type - Plan: MR Brain Wo Contrast  Abnormal neurological exam - Plan: MR Brain Wo Contrast  Dizziness and giddiness - Plan: MR Brain Wo Contrast  Confusion - Plan: MR Brain Wo Contrast  In a patient with new onset headache, confusion lasting at least 48 hours, dizziness and altered coordination, abnormal Romberg, abnormal balance testing on 1 foot and tandem stance, concern for neurological process. History significant for very heavy early CAD in multiple family members, heavy smoker, and father with h/o CAD. Obtain an MRI of the brain without contrast to evaluate for CVA, demyelinating process, or other acute neurological change to explain changes.   New Prescriptions   No medications on file   Orders Placed This Encounter  Procedures  . MR Brain Wo Contrast    Signed,  Nashae Maudlin T. Johny Pitstick, MD   Patient's Medications  New Prescriptions   No medications on file  Previous Medications   ACETAMINOPHEN (TYLENOL) 500 MG TABLET    Take 1,000 mg by mouth every 6 (six) hours as needed.     ASPIRIN 81 MG CHEWABLE TABLET    Chew 81 mg by mouth daily.     IBUPROFEN (ADVIL,MOTRIN) 200 MG TABLET    Take 400 mg by mouth every 6 (six) hours as needed.     MULTIPLE VITAMIN (MULTIVITAMIN) TABLET    Take 1 tablet by mouth daily.     OMEPRAZOLE (PRILOSEC) 20 MG CAPSULE    Take 1 capsule (20 mg total) by mouth daily.  Modified Medications   No medications on file  Discontinued Medications   HYDROCODONE-ACETAMINOPHEN (NORCO) 5-325 MG PER TABLET    Take 1 tablet by mouth every 6 (six) hours as needed for moderate pain.   PREDNISONE (DELTASONE) 20 MG TABLET    Take two pills daily for 4 days followed by one pill daily for 4 days   RANITIDINE (ZANTAC) 150 MG TABLET    Take  150 mg by mouth 2 (two) times daily as needed for heartburn.   TERAZOSIN (HYTRIN) 1 MG CAPSULE    TAKE 1 CAPSULE BY MOUTH EVERY NIGHT AT BEDTIME

## 2015-04-15 NOTE — Progress Notes (Signed)
Pre visit review using our clinic review tool, if applicable. No additional management support is needed unless otherwise documented below in the visit note. 

## 2015-04-15 NOTE — Telephone Encounter (Signed)
Called pt to schedule ED f/u, pt states he did not need appointment, states he had acid reflux.

## 2015-04-16 ENCOUNTER — Encounter: Payer: Self-pay | Admitting: Family Medicine

## 2015-04-19 ENCOUNTER — Ambulatory Visit
Admission: RE | Admit: 2015-04-19 | Discharge: 2015-04-19 | Disposition: A | Discharge status: Home / Self Care | Payer: 59 | Source: Ambulatory Visit | Attending: Family Medicine | Admitting: Family Medicine

## 2015-04-19 DIAGNOSIS — R2990 Unspecified symptoms and signs involving the nervous system: Secondary | ICD-10-CM

## 2015-04-19 DIAGNOSIS — R51 Headache: Secondary | ICD-10-CM | POA: Diagnosis present

## 2015-04-19 DIAGNOSIS — R9089 Other abnormal findings on diagnostic imaging of central nervous system: Secondary | ICD-10-CM | POA: Insufficient documentation

## 2015-04-19 DIAGNOSIS — J329 Chronic sinusitis, unspecified: Secondary | ICD-10-CM | POA: Insufficient documentation

## 2015-04-19 DIAGNOSIS — R519 Headache, unspecified: Secondary | ICD-10-CM

## 2015-04-19 DIAGNOSIS — R410 Disorientation, unspecified: Secondary | ICD-10-CM | POA: Diagnosis present

## 2015-04-19 DIAGNOSIS — M47892 Other spondylosis, cervical region: Secondary | ICD-10-CM | POA: Diagnosis not present

## 2015-04-19 DIAGNOSIS — R42 Dizziness and giddiness: Secondary | ICD-10-CM

## 2015-04-22 ENCOUNTER — Other Ambulatory Visit: Payer: Self-pay | Admitting: Family Medicine

## 2015-04-22 DIAGNOSIS — R51 Headache: Secondary | ICD-10-CM

## 2015-04-22 DIAGNOSIS — R9089 Other abnormal findings on diagnostic imaging of central nervous system: Secondary | ICD-10-CM

## 2015-04-22 DIAGNOSIS — R519 Headache, unspecified: Secondary | ICD-10-CM

## 2015-04-22 DIAGNOSIS — R2990 Unspecified symptoms and signs involving the nervous system: Secondary | ICD-10-CM

## 2015-04-22 DIAGNOSIS — R42 Dizziness and giddiness: Secondary | ICD-10-CM

## 2015-04-29 ENCOUNTER — Other Ambulatory Visit: Payer: 59

## 2015-04-29 ENCOUNTER — Telehealth: Payer: Self-pay | Admitting: Neurology

## 2015-04-29 ENCOUNTER — Encounter: Payer: Self-pay | Admitting: Neurology

## 2015-04-29 ENCOUNTER — Ambulatory Visit (INDEPENDENT_AMBULATORY_CARE_PROVIDER_SITE_OTHER): Payer: 59 | Admitting: Neurology

## 2015-04-29 VITALS — BP 112/70 | HR 88 | Resp 16 | Ht 65.5 in | Wt 144.7 lb

## 2015-04-29 DIAGNOSIS — I679 Cerebrovascular disease, unspecified: Secondary | ICD-10-CM

## 2015-04-29 DIAGNOSIS — G4452 New daily persistent headache (NDPH): Secondary | ICD-10-CM | POA: Diagnosis not present

## 2015-04-29 DIAGNOSIS — Z720 Tobacco use: Secondary | ICD-10-CM | POA: Diagnosis not present

## 2015-04-29 DIAGNOSIS — R42 Dizziness and giddiness: Secondary | ICD-10-CM

## 2015-04-29 HISTORY — DX: Cerebrovascular disease, unspecified: I67.9

## 2015-04-29 MED ORDER — NORTRIPTYLINE HCL 10 MG PO CAPS: 10.0000 mg | ORAL_CAPSULE | Freq: Every day | ORAL | Status: DC

## 2015-04-29 NOTE — Telephone Encounter (Signed)
Pt called stated he went to pick up his prescriptions and there was no prescription for the Prednisone taper called in/Dawn CB# [phone]

## 2015-04-29 NOTE — Patient Instructions (Addendum)
1.  Will get CTA of head to rule out any vascular cause of headache and dizziness. [facility]  05/01/15 9:45 am 2.  Will start nortriptyline 10mg  at bedtime.  Call in 4 weeks with update and we can increase dose if needed. 3.  To help break the headache, will prescribe a prednisone taper.  Take 6tabs x1day, then 5tabs x1day, then 4tabs x1day, then 3tabs x1day, then 2tabs x1day, then 1tab x1day, then STOP 4.  Limit use of pain relievers to no more than 2 days out of the week 5.  Recommend cardiac evaluation 6.  Will check Sed Rate and Lyme. 7.  Follow up in 3 months.

## 2015-04-29 NOTE — Progress Notes (Signed)
NEUROLOGY CONSULTATION NOTE  Isaiah Rangel MRN: [id number] DOB: 09-20-1960  Referring provider: Dr. Lorelei Pont Primary care provider: Dr. Lorelei Pont  Reason for consult:  Headache, dizziness and abnormal brain MRI  HISTORY OF PRESENT ILLNESS: Isaiah Rangel is a 54 year old right-handed male with BPH, GERD, OA, hyperlipidemia, and current smoker who presents for headache, confusion and abnormal MRI of the brain.  History obtained by patient, his wife and ED note.  Labs and MRI of brain imaging personally reviewed.  About 6 weeks ago, following the death of his father, he began experiencing daily headaches.  They are holocephalic and more prominent in the frontal region.  They are constantly a dull 4/10 headache but will fluctuate to a 9/10 sharp headache, lasting 15-20 minutes and occuring twice a day.  He does describe some photophobia.  He will sometimes have episodes of dizziness.  He has had BPPV in the past, but these spells are different.  It is not a spinning sensation.  It is associated with diaphoresis, nausea and unsteadiness on his feet.  One time, he was unable to drive while having a spell and his wife had to pick him up.  It can last all day.  On a couple of occasions, he thought he was going to pass out.  It did not respond to the Epley maneuver, as his previous vertigo had done.  He had an episode of confusion at work.  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  One time, he had difficulty figuring out how to connect the wiring.  He also occasionally has experienced chest pressure radiating to the left shoulder.  He presented to the ED on 04/10/15 for one of these episodes.  This was associated with nausea, vomiting and diaphoresis.  Labs, including CBC, BMP and troponins, were unremarkable.  CXR was negative.  EKG showed sinus bradycardia at 59 bpm with narrow QRS, normal axis, and normal ST and T waves.  He was advised to follow up with his cardiologist in the  next couple of days, which he has not yet done so.  He followed up with his PCP.  He had an MRI of the brain without contrast on 04/19/15, which showed mild punctate scattered T2 hyperintensities in the subcortical white matter, similar to prior imaging from 03/29/13.    He often is outdoors and has had a couple of tick bites this past summer but no rash.  He has history of dehydration during the summer, leading to ED visits, but this summer he has been careful to keep cool and hydrated.  He has no prior history of headache.  There is no family history of headache or cerebral aneurysm.  He has history of some anxiety but nothing new.   PAST MEDICAL HISTORY: Past Medical History  Diagnosis Date  . OA (osteoarthritis)   . Current smoker   . GERD (gastroesophageal reflux disease)   . Anxiety   . BPH (benign prostatic hyperplasia)     PAST SURGICAL HISTORY: Past Surgical History  Procedure Laterality Date  . Back surgery    . Neck fusion    . Nasal sinus surgery    . Knee arthroscopy w/ partial medial meniscectomy Right 2012    [place], 2012  . Vasectomy    . Cardiac catheterization  04/06/13    [facility]- minor luminal irregularities, otherwise normal cors; EF > 55%. Medical management and smoking cessation recommended    MEDICATIONS: Current Outpatient Prescriptions on File Prior  to Visit  Medication Sig Dispense Refill  . acetaminophen (TYLENOL) 500 MG tablet Take 1,000 mg by mouth every 6 (six) hours as needed.      Marland Kitchen aspirin 81 MG chewable tablet Chew 81 mg by mouth daily.      Marland Kitchen ibuprofen (ADVIL,MOTRIN) 200 MG tablet Take 400 mg by mouth every 6 (six) hours as needed.      . Multiple Vitamin (MULTIVITAMIN) tablet Take 1 tablet by mouth daily.      Marland Kitchen omeprazole (PRILOSEC) 20 MG capsule Take 1 capsule (20 mg total) by mouth daily. 30 capsule 6   No current facility-administered medications on file prior to visit.    ALLERGIES: No Known Allergies  FAMILY HISTORY: Family History    Problem Relation Age of Onset  . Arthritis Father   . Coronary artery disease Father   . Hypertension Father   . Coronary artery disease Brother 22  . Colon cancer Mother   . Esophageal cancer Neg Hx   . Stomach cancer Neg Hx   . Heart attack Brother   . Heart attack Brother   . CVA Father 50    SOCIAL HISTORY: Social History   Social History  . Marital Status: Married    Spouse Name: N/A  . Number of Children: N/A  . Years of Education: N/A   Occupational History  . [employer]    Social History Main Topics  . Smoking status: Current Every Day Smoker -- 1.50 packs/day for 30 years    Types: Cigarettes  . Smokeless tobacco: Never Used     Comment: he is aware he needs to quit   . Alcohol Use: No  . Drug Use: No  . Sexual Activity:    Partners: Female   Other Topics Concern  . 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[employer])      Likes to work out       Lives with GF    REVIEW OF SYSTEMS: Constitutional: No fevers, chills, or sweats, no generalized fatigue, change in appetite Eyes: No visual changes, double vision, eye pain Ear, nose and throat: No hearing loss, ear pain, nasal congestion, sore throat Cardiovascular: as above Respiratory:  No shortness of breath at rest or with exertion, wheezes GastrointestinaI: as above Genitourinary:  No dysuria, urinary retention or frequency Musculoskeletal:  No neck pain, back pain Integumentary: No rash, pruritus, skin lesions Neurological: as above Psychiatric: No depression, insomnia, anxiety Endocrine: No palpitations, fatigue, diaphoresis, mood swings, change in appetite, change in weight, increased thirst Hematologic/Lymphatic:  No anemia, purpura, petechiae. Allergic/Immunologic: no itchy/runny eyes, nasal congestion, recent allergic reactions, rashes  PHYSICAL EXAM: Filed Vitals:   04/29/15 0824  BP: 112/70  Pulse: 88  Resp: 16   General: No acute distress.  Patient appears  well-groomed.   Head:  Normocephalic/atraumatic Eyes:  fundi unremarkable, without vessel changes, exudates, hemorrhages or papilledema. Neck: supple, no paraspinal tenderness, full range of motion Back: No paraspinal tenderness Heart: regular rate and rhythm Lungs: Clear to auscultation bilaterally. Vascular: No carotid bruits. Neurological Exam: Mental status: alert and oriented to person, place, and time, recent and remote memory intact, fund of knowledge intact, attention and concentration intact, speech fluent and not dysarthric, language intact. Cranial nerves: CN I: not tested CN II: pupils equal, round and reactive to light, visual fields intact, fundi unremarkable, without vessel changes, exudates, hemorrhages or papilledema. CN III, IV, VI:  full range of motion, no nystagmus,  no ptosis CN V: facial sensation intact CN VII: upper and lower face symmetric CN VIII: hearing intact CN IX, X: gag intact, uvula midline CN XI: sternocleidomastoid and trapezius muscles intact CN XII: tongue midline Bulk & Tone: normal, no fasciculations. Motor:  5/5 throughout. Sensation:  Temperature and vibration sensation intact. Deep Tendon Reflexes:  2+ throughout, toes downgoing.  Finger to nose testing:  Without dysmetria.  Heel to shin:  Without dysmetria.  Gait:  Antalgic gait due to hip pain.  No ataxia.  Able to turn and tandem walk. Romberg negative.  IMPRESSION: New daily persistent headache Dizziness.  It does not sound like true vertigo.  Symptoms are overall non-focal.   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  He should call in 4 weeks with update and we can increase dose. Will check Sed Rate and Lyme  titer Recommend cardiac workup (I cannot explain chest discomfort due to a neurologic etiology) Smoking cessation  Thank you for allowing me to take part in the care of this patient.  Metta Clines, DO  CC:  Owens Loffler, MD

## 2015-04-30 LAB — B. BURGDORFI ANTIBODIES: B burgdorferi Ab IgG+IgM: 0.29 {ISR}

## 2015-04-30 LAB — SEDIMENTATION RATE: Sed Rate: 1 mm/hr (ref 0–20)

## 2015-04-30 NOTE — Telephone Encounter (Signed)
Patient is aware medication was called in to pharmacy  And is ready for pick up

## 2015-05-01 ENCOUNTER — Telehealth: Payer: Self-pay | Admitting: *Deleted

## 2015-05-01 ENCOUNTER — Ambulatory Visit (HOSPITAL_COMMUNITY)
Admission: RE | Admit: 2015-05-01 | Discharge: 2015-05-01 | Disposition: A | Discharge status: Home / Self Care | Payer: 59 | Source: Ambulatory Visit | Attending: Neurology | Admitting: Neurology

## 2015-05-01 ENCOUNTER — Encounter (HOSPITAL_COMMUNITY): Payer: Self-pay

## 2015-05-01 DIAGNOSIS — Z720 Tobacco use: Secondary | ICD-10-CM

## 2015-05-01 DIAGNOSIS — R51 Headache: Secondary | ICD-10-CM | POA: Diagnosis not present

## 2015-05-01 DIAGNOSIS — G4452 New daily persistent headache (NDPH): Secondary | ICD-10-CM

## 2015-05-01 DIAGNOSIS — I6501 Occlusion and stenosis of right vertebral artery: Secondary | ICD-10-CM | POA: Insufficient documentation

## 2015-05-01 DIAGNOSIS — I679 Cerebrovascular disease, unspecified: Secondary | ICD-10-CM

## 2015-05-01 DIAGNOSIS — R42 Dizziness and giddiness: Secondary | ICD-10-CM | POA: Insufficient documentation

## 2015-05-01 MED ORDER — IOHEXOL 350 MG/ML SOLN
50.0000 mL | Freq: Once | INTRAVENOUS | Status: AC | PRN
  Administered 2015-05-01: 50 mL via INTRAVENOUS

## 2015-05-01 NOTE — Telephone Encounter (Signed)
-----   Message from Pieter Partridge, DO sent at 04/30/2015 12:10 PM EDT ----- Labs are negative for Lyme

## 2015-05-01 NOTE — Telephone Encounter (Signed)
Patient is aware of neg Lyme

## 2015-05-08 ENCOUNTER — Telehealth: Payer: Self-pay | Admitting: Family Medicine

## 2015-05-08 ENCOUNTER — Telehealth: Payer: Self-pay

## 2015-05-08 MED ORDER — NORTRIPTYLINE HCL 25 MG PO CAPS: 25.0000 mg | ORAL_CAPSULE | Freq: Every day | ORAL | Status: DC

## 2015-05-08 NOTE — Telephone Encounter (Signed)
D/w wife Mitzi.   9/10 HA, some visual changes again. They made optho appt, which I think is reasonable and have f/u with neuro.   I asked to increased Pamelor to 25 mg QHS to see if helps with HA / potential migraine activity.  Electronically Signed  By: Owens Loffler, MD On: 05/08/2015 5:42 PM

## 2015-05-08 NOTE — Telephone Encounter (Signed)
Isaiah Rangel (DPR signed) wants to speak with Dr Lorelei Pont; saw neurologist and started prednisone taper and nortriptylline about 2 weeks ago; Isaiah Rangel wants Dr Lorelei Pont to know pt was neg for MS and limes disease neg. May be small vessel disease according to MRI. Prednisone and nortriptylline helped with h/a. Finished prednisone 05/06/15 and today h/a pain level 9 also effects rt eye with blurred vision and dizziness on and off. No N&V. Pt to see neurologist again mid Sept. Isaiah Rangel wants Dr Serita Grit advise if pt should see eye specialist or what he should do.

## 2015-06-26 ENCOUNTER — Encounter: Payer: Self-pay | Admitting: Primary Care

## 2015-06-26 ENCOUNTER — Ambulatory Visit (INDEPENDENT_AMBULATORY_CARE_PROVIDER_SITE_OTHER): Payer: 59 | Admitting: Primary Care

## 2015-06-26 ENCOUNTER — Ambulatory Visit: Payer: 59 | Admitting: Internal Medicine

## 2015-06-26 VITALS — BP 122/72 | HR 92 | Temp 98.2°F | Ht 65.5 in | Wt 148.4 lb

## 2015-06-26 DIAGNOSIS — R079 Chest pain, unspecified: Secondary | ICD-10-CM | POA: Diagnosis not present

## 2015-06-26 LAB — BASIC METABOLIC PANEL
BUN: 14 mg/dL (ref 6–23)
CO2: 30 mEq/L (ref 19–32)
Calcium: 9.7 mg/dL (ref 8.4–10.5)
Chloride: 105 mEq/L (ref 96–112)
Creatinine, Ser: 0.99 mg/dL (ref 0.40–1.50)
GFR: 83.35 mL/min (ref >60.00)
Glucose, Bld: 118 mg/dL — ABNORMAL HIGH (ref 70–99)
Potassium: 3.8 mEq/L (ref 3.5–5.1)
Sodium: 142 mEq/L (ref 135–145)

## 2015-06-26 LAB — CBC WITH DIFFERENTIAL/PLATELET
Basophils Absolute: 0.1 10*3/uL (ref 0.0–0.1)
Basophils Relative: 0.8 % (ref 0.0–3.0)
Eosinophils Absolute: 0.5 10*3/uL (ref 0.0–0.7)
Eosinophils Relative: 3.1 % (ref 0.0–5.0)
HCT: 40.4 % (ref 39.0–52.0)
Hemoglobin: 13.4 g/dL (ref 13.0–17.0)
Lymphocytes Relative: 22.9 % (ref 12.0–46.0)
Lymphs Abs: 3.5 10*3/uL (ref 0.7–4.0)
MCHC: 33.2 g/dL (ref 30.0–36.0)
MCV: 93.2 fl (ref 78.0–100.0)
Monocytes Absolute: 0.7 10*3/uL (ref 0.1–1.0)
Monocytes Relative: 4.3 % (ref 3.0–12.0)
Neutro Abs: 10.5 10*3/uL — ABNORMAL HIGH (ref 1.4–7.7)
Neutrophils Relative %: 68.9 % (ref 43.0–77.0)
Platelets: 224 10*3/uL (ref 150.0–400.0)
RBC: 4.34 Mil/uL (ref 4.22–5.81)
RDW: 14.9 % (ref 11.5–15.5)
WBC: 15.2 10*3/uL — ABNORMAL HIGH (ref 4.0–10.5)

## 2015-06-26 LAB — TSH: TSH: 1.4 u[IU]/mL (ref 0.35–4.50)

## 2015-06-26 NOTE — Progress Notes (Signed)
Pre visit review using our clinic review tool, if applicable. No additional management support is needed unless otherwise documented below in the visit note. 

## 2015-06-26 NOTE — Patient Instructions (Addendum)
Your ECG looks good and is unchanged from the one performed in August.  Stop by the front and speak with Rosaria Ferries regarding your referral to Cardiology for evaluation of chest pain.   Complete lab work prior to leaving today. I will notify you of your results.  Please call 911 if you develop chest pain that does not resolve and/or radiates down your arms, through your back, or up your neck.  It was a pleasure meeting you!

## 2015-06-26 NOTE — Progress Notes (Signed)
Subjective:    Patient ID: Isaiah Rangel, male    DOB: 02/18/1961, 54 y.o.   MRN: [id number]  HPI  Isaiah Rangel is a 54 year old male who presents today with a chief complaint of substernal chest pain. His pain has been present for nearly 2 months recently but he's had ongoing episodes for years. He describes his pain as sharp, intermittent in nature, without radiation, and will last 3-5 minutes. He will experience his pain 3-4 times weekly and will come about with exertion and rest. Denies pain with eating and drinking, he has a chronic cough as a life long smoker, but has had no change, denies fevers, belching, epigastric pain. He's managed on a PPI. His pain was a 9/10 today. Sometimes will have pain upon deep inspiration. He has not followed up with Cardiology as suggested. He's had a negative work up including echocardiogram, stress test, and catheterization in 2014. He and his wife are very concerned and are looking for a conclusion to his pain.  Review of Systems  Constitutional: Negative for fever.  HENT: Negative for rhinorrhea.   Respiratory: Negative for cough and shortness of breath.   Cardiovascular: Positive for chest pain. Negative for palpitations.  Gastrointestinal: Negative for abdominal pain.  Neurological: Negative for dizziness, numbness and headaches.       Past Medical History  Diagnosis Date  . OA (osteoarthritis)   . Current smoker   . GERD (gastroesophageal reflux disease)   . Anxiety   . BPH (benign prostatic hyperplasia)     Social History   Social History  . Marital Status: Married    Spouse Name: N/A  . Number of Children: N/A  . Years of Education: N/A   Occupational History  . [employer]    Social History Main Topics  . Smoking status: Current Every Day Smoker -- 1.50 packs/day for 30 years    Types: Cigarettes  . Smokeless tobacco: Never Used     Comment: he is aware he needs to quit   . Alcohol Use: No  . Drug Use: No  . Sexual Activity:      Partners: Female   Other Topics Concern  . 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 Lister Mcnatt([employer])      Likes to work out       Lives with GF    Past Surgical History  Procedure Laterality Date  . Back surgery    . Neck fusion    . Nasal sinus surgery    . Knee arthroscopy w/ partial medial meniscectomy Right 2012    [place], 2012  . Vasectomy    . Cardiac catheterization  04/06/13    [facility]- minor luminal irregularities, otherwise normal cors; EF > 55%. Medical management and smoking cessation recommended    Family History  Problem Relation Age of Onset  . Arthritis Father   . Coronary artery disease Father   . Hypertension Father   . Coronary artery disease Brother 71  . Colon cancer Mother   . Esophageal cancer Neg Hx   . Stomach cancer Neg Hx   . Heart attack Brother   . Heart attack Brother   . CVA Father 76    No Known Allergies  Current Outpatient Prescriptions on File Prior to Visit  Medication Sig Dispense Refill  . acetaminophen (TYLENOL) 500 MG tablet Take 1,000 mg by mouth every 6 (six) hours as needed.      Marland Kitchen  aspirin 81 MG chewable tablet Chew 81 mg by mouth daily.      Marland Kitchen ibuprofen (ADVIL,MOTRIN) 200 MG tablet Take 400 mg by mouth every 6 (six) hours as needed.      . Multiple Vitamin (MULTIVITAMIN) tablet Take 1 tablet by mouth daily.      . nortriptyline (PAMELOR) 25 MG capsule Take 1 capsule (25 mg total) by mouth at bedtime. (Patient taking differently: Take 50 mg by mouth at bedtime. ) 30 capsule 5  . omeprazole (PRILOSEC) 20 MG capsule Take 1 capsule (20 mg total) by mouth daily. 30 capsule 6   No current facility-administered medications on file prior to visit.    BP 122/72 mmHg  Pulse 92  Temp(Src) 98.2 F (36.8 C) (Oral)  Ht 5' 5.5" (1.664 m)  Wt 148 lb 6.4 oz (67.314 kg)  BMI 24.31 kg/m2  SpO2 95%    Objective:   Physical Exam  Constitutional: He is oriented to person, place, and time. He appears  well-nourished.  Cardiovascular: Normal rate and regular rhythm.   Pulmonary/Chest: Effort normal and breath sounds normal. He has no wheezes. He has no rales.  Neurological: He is alert and oriented to person, place, and time.  Skin: Skin is warm and dry.  Psychiatric: He has a normal mood and affect.          Assessment & Plan:  ECG: NSR, rate of 87. No ST elevation, or changes from prior ECG in August 2016. No PAC's or PVC's.

## 2015-06-27 ENCOUNTER — Telehealth: Payer: Self-pay

## 2015-06-27 ENCOUNTER — Ambulatory Visit (INDEPENDENT_AMBULATORY_CARE_PROVIDER_SITE_OTHER)
Admission: RE | Admit: 2015-06-27 | Discharge: 2015-06-27 | Disposition: A | Discharge status: Home / Self Care | Payer: 59 | Source: Ambulatory Visit | Attending: Primary Care | Admitting: Primary Care

## 2015-06-27 ENCOUNTER — Other Ambulatory Visit: Payer: Self-pay | Admitting: Primary Care

## 2015-06-27 DIAGNOSIS — R079 Chest pain, unspecified: Secondary | ICD-10-CM | POA: Diagnosis not present

## 2015-06-27 DIAGNOSIS — G8929 Other chronic pain: Secondary | ICD-10-CM | POA: Insufficient documentation

## 2015-06-27 LAB — D-DIMER, QUANTITATIVE (NOT AT ARMC): D-Dimer, Quant: 0.27 ug/mL-FEU (ref 0.00–0.48)

## 2015-06-27 NOTE — Assessment & Plan Note (Signed)
Present for years intermittently with negative work up in 2014. Sharp, intermittent, intense pain over past 2 months sub sternally. ECG is unchanged and stable when comparing to one performed in August 2016 which is reassuring. Exam unremarkable, no pain or symptoms. He never followed back up with cardiology in the past as he was recommended to do so. Will obtain TSH, CBC, CMP today. Referral made to cardiology for possible Holter Monitor evaluation and repeat stress test. Discussed return/ED precautions.

## 2015-06-27 NOTE — Telephone Encounter (Signed)
l mom to let pt know ( see note below) we will put him on a wait list  Georgiana Shore, RN  Blain Pais           EKG looks good to me. Dr. Ellyn Hack reviewed and confirmed everything looks good and ok for end of Nov appt       Previous Messages     ----- Message -----   From: Blain Pais   Sent: 06/26/2015  2:50 PM    To: Rebeca Alert [facility] Triage  Subject: please advise                   Dr. Edilia Bo pt, intermittent CP for 2-3 months. Please review EKG. Pt is scheduled for 11/29, advise if pt needs sooner appt. Thanks

## 2015-07-01 ENCOUNTER — Telehealth: Payer: Self-pay

## 2015-07-01 NOTE — Telephone Encounter (Signed)
PLEASE NOTE: All timestamps contained within this report are represented as [place] Standard Time. CONFIDENTIALTY NOTICE: This fax transmission is intended only for the addressee. It contains information that is legally privileged, confidential or otherwise protected from use or disclosure. If you are not the intended recipient, you are strictly prohibited from reviewing, disclosing, copying using or disseminating any of this information or taking any action in reliance on or regarding this information. If you have received this fax in error, please notify us immediately by telephone so that we can arrange for its return to [place]. Phone: [phone], Toll-Free: [phone], Fax: [phone] Page: 1 of 2 Call Id: [id number] [place] Patient Name: Isaiah Rangel Gender: Male DOB: 10-05-1960 Age: 54 Y 2 M 23 D Return Phone Number: [phone] (Primary) Address: City/State/Zip: [place] Client [place] Night - Client Client Site [facility] [facility] Night Physician [name], [place] Type Call Call Type Triage / [place] Name Mitzy Relationship To Patient Spouse Return Phone Number [phone] (Primary) Chief Complaint Health information question (non symptomatic) Initial Comment Caller states they are needing results on a chest xray that was done today. Nurse Assessment Nurse: Angeline Slim, RN, [place] Date/Time (Eastern Time): 06/28/2015 5:22:38 PM Confirm and document reason for call. If symptomatic, describe symptoms. ---caller states she needs results of chest xray that was done yesterday evening at [facility] on [address]. Has been having problems because white count is high Has the patient traveled out of the country within the last 30 days? ---Not Applicable Does the patient have any new or worsening symptoms? ---No Please document clinical  information provided and list any resource used. ---caller requesting results of xray done yesterday Guidelines Guideline Title Affirmed Question Affirmed Notes Nurse Date/Time ([place] Time) Disp. Time Eilene Ghazi Time) Disposition Final User 06/28/2015 5:25:33 PM Paged On Call back to [facility], [place], [place] 06/28/2015 5:26:00 PM Paged On Call back to Call [facility], [place], [place] Reason: paged oncall to center 06/28/2015 5:34:38 PM Clinical Call Yes Angeline Slim, RN, [place] After Care Instructions Given Call Event Type User Date / Time Description Paging [facility] Phone DateTime Result/Outcome Message Type Notes Carolann Littler [phone] 06/28/2015 5:25:33 PM Paged On Call Back to Call Center Doctor Paged please call [phone] PLEASE NOTE: All timestamps contained within this report are represented as [place] Standard Time. CONFIDENTIALTY NOTICE: This fax transmission is intended only for the addressee. It contains information that is legally privileged, confidential or otherwise protected from use or disclosure. If you are not the intended recipient, you are strictly prohibited from reviewing, disclosing, copying using or disseminating any of this information or taking any action in reliance on or regarding this information. If you have received this fax in error, please notify us immediately by telephone so that we can arrange for its return to [place]. Phone: [phone], Toll-Free: [phone], Fax: [phone] Page: 2 of 2 Call Id: [id number] Paging DoctorName Phone DateTime Result/Outcome Message Type Notes Carolann Littler 06/28/2015 5:32:05 PM Spoke with On Call - General Message Result spoke with oncall, stated xray showed chronic scarring and copd nothing acute

## 2015-07-01 NOTE — Telephone Encounter (Signed)
Barnett Applebaum, the RN on-call should have been able to handle this. I am not sure why they would have paged the doctor.  Can you send this to the appropriate people as a example of what not to do? They should be able to pull up the x-ray reports.

## 2015-07-02 NOTE — Telephone Encounter (Signed)
Josh, I am going to pass on to you, but there are many more examples. The difference between weekend call now and when we had call-a-nurse is night and day.   The RN's need Epic access. If they have daytime access, then they should have nighttime access.   Thanks - I think you are the person to address this.   Electronically Signed  By: Owens Loffler, MD On: 07/02/2015 3:23 PM

## 2015-07-02 NOTE — Telephone Encounter (Signed)
This call came in after 5 pm.  Unfortunately, the [facility] nurses do not have access to Epic after hours.  This is why the on-call MD was paged.

## 2015-07-30 ENCOUNTER — Ambulatory Visit: Payer: 59 | Admitting: Physician Assistant

## 2015-08-02 ENCOUNTER — Ambulatory Visit (INDEPENDENT_AMBULATORY_CARE_PROVIDER_SITE_OTHER): Payer: 59 | Admitting: Neurology

## 2015-08-02 ENCOUNTER — Encounter: Payer: Self-pay | Admitting: Neurology

## 2015-08-02 VITALS — BP 110/68 | HR 70 | Ht 65.0 in | Wt 149.0 lb

## 2015-08-02 DIAGNOSIS — G4452 New daily persistent headache (NDPH): Secondary | ICD-10-CM

## 2015-08-02 DIAGNOSIS — F17200 Nicotine dependence, unspecified, uncomplicated: Secondary | ICD-10-CM

## 2015-08-02 DIAGNOSIS — I679 Cerebrovascular disease, unspecified: Secondary | ICD-10-CM

## 2015-08-02 NOTE — Patient Instructions (Signed)
Continue nortriptyline 50mg at bedtime ?Follow up in 6 months.   ?

## 2015-08-02 NOTE — Progress Notes (Signed)
NEUROLOGY FOLLOW UP OFFICE NOTE  [facility] [id number]  HISTORY OF PRESENT ILLNESS: Isaiah Rangel is a 54 year old right-handed male with BPH, GERD, OA, hyperlipidemia, and current smoker who presents for new daily persistent headaches.  Images of head CTA reviewed.  Recent history per patient and PCP notes.  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  Sed Rate was 1.  Lyme was negative.    Nortriptyline was increased to 50mg  at bedtime and he got new prescription for his glasses.  Since then, headaches are much better.  He only gets a mild one once in a while.  HISTORY: In 29-Mar-2015, following the death of his father, he began experiencing daily headaches.  They are holocephalic and more prominent in the frontal region.  They are constantly a dull 4/10 headache but will fluctuate to a 9/10 sharp headache, lasting 15-20 minutes and occuring twice a day.  He does describe some photophobia.  He will sometimes have episodes of dizziness.  He has had BPPV in the past, but these spells are different.  It is not a spinning sensation.  It is associated with diaphoresis, nausea and unsteadiness on his feet.  One time, he was unable to drive while having a spell and his wife had to pick him up.  It can last all day.  On a couple of occasions, he thought he was going to pass out.  It did not respond to the Epley maneuver, as his previous vertigo had done.  He had an episode of confusion at work.  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  One time, he had difficulty figuring out how to connect the wiring.  He also occasionally has experienced chest pressure radiating to the left shoulder.  He presented to the ED on 04/10/15 for one of these episodes.  This was associated with nausea, vomiting and diaphoresis.  Labs, including CBC, BMP and troponins, were  unremarkable.  CXR was negative.  EKG showed sinus bradycardia at 59 bpm with narrow QRS, normal axis, and normal ST and T waves.  He was advised to follow up with his cardiologist in the next couple of days, which he has not yet done so.  He followed up with his PCP.  He had an MRI of the brain without contrast on 04/19/15, which showed mild punctate scattered T2 hyperintensities in the subcortical white matter, similar to prior imaging from 03/29/13.    He often is outdoors and has had a couple of tick bites this past summer but no rash.  He has history of dehydration during the summer, leading to ED visits, but this summer he has been careful to keep cool and hydrated.  He has no prior history of headache.  There is no family history of headache or cerebral aneurysm.  He has history of some anxiety but nothing new.  PAST MEDICAL HISTORY: Past Medical History  Diagnosis Date  . OA (osteoarthritis)   . Current smoker   . GERD (gastroesophageal reflux disease)   . Anxiety   . BPH (benign prostatic hyperplasia)     MEDICATIONS: Current Outpatient Prescriptions on File Prior to Visit  Medication Sig Dispense Refill  . acetaminophen (TYLENOL) 500 MG tablet Take 1,000 mg by mouth every 6 (six) hours as needed.      . Ascorbic Acid (VITAMIN C) 1000 MG tablet Take 1,000  mg by mouth daily.    Marland Kitchen aspirin 81 MG chewable tablet Chew 81 mg by mouth daily.      Marland Kitchen ibuprofen (ADVIL,MOTRIN) 200 MG tablet Take 400 mg by mouth every 6 (six) hours as needed.      . Multiple Vitamin (MULTIVITAMIN) tablet Take 1 tablet by mouth daily.      . nortriptyline (PAMELOR) 25 MG capsule Take 1 capsule (25 mg total) by mouth at bedtime. (Patient taking differently: Take 50 mg by mouth at bedtime. ) 30 capsule 5  . Omega-3 Fatty Acids (FISH OIL) 1000 MG CAPS Take 1 capsule by mouth daily.    Marland Kitchen omeprazole (PRILOSEC) 20 MG capsule Take 1 capsule (20 mg total) by mouth daily. 30 capsule 6   No current facility-administered  medications on file prior to visit.    ALLERGIES: No Known Allergies  FAMILY HISTORY: Family History  Problem Relation Age of Onset  . Arthritis Father   . Coronary artery disease Father   . Hypertension Father   . Coronary artery disease Brother 41  . Colon cancer Mother   . Esophageal cancer Neg Hx   . Stomach cancer Neg Hx   . Heart attack Brother   . Heart attack Brother   . CVA Father 1    SOCIAL HISTORY: Social History   Social History  . Marital Status: Married    Spouse Name: N/A  . Number of Children: N/A  . Years of Education: N/A   Occupational History  . [place]    Social History Main Topics  . Smoking status: Current Every Day Smoker -- 1.50 packs/day for 30 years    Types: Cigarettes  . Smokeless tobacco: Never Used     Comment: he is aware he needs to quit   . Alcohol Use: No  . Drug Use: No  . Sexual Activity:    Partners: Female   Other Topics Concern  . 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 Lister Albarran([employer])      Likes to work out       Lives with GF    REVIEW OF SYSTEMS: Constitutional: No fevers, chills, or sweats, no generalized fatigue, change in appetite Eyes: No visual changes, double vision, eye pain Ear, nose and throat: No hearing loss, ear pain, nasal congestion, sore throat Cardiovascular: No chest pain, palpitations Respiratory:  No shortness of breath at rest or with exertion, wheezes GastrointestinaI: No nausea, vomiting, diarrhea, abdominal pain, fecal incontinence Genitourinary:  No dysuria, urinary retention or frequency Musculoskeletal:  No neck pain, back pain Integumentary: No rash, pruritus, skin lesions Neurological: as above Psychiatric: No depression, insomnia, anxiety Endocrine: No palpitations, fatigue, diaphoresis, mood swings, change in appetite, change in weight, increased thirst Hematologic/Lymphatic:  No anemia, purpura, petechiae. Allergic/Immunologic: no itchy/runny eyes,  nasal congestion, recent allergic reactions, rashes  PHYSICAL EXAM: Filed Vitals:   08/02/15 0756  BP: 110/68  Pulse: 70   General: No acute distress.  Patient appears well-groomed.  normal body habitus. Head:  Normocephalic/atraumatic Eyes:  Fundoscopic exam unremarkable without vessel changes, exudates, hemorrhages or papilledema. Neck: supple, no paraspinal tenderness, full range of motion Heart:  Regular rate and rhythm Lungs:  Clear to auscultation bilaterally Back: No paraspinal tenderness Neurological Exam: alert and oriented to person, place, and time. Attention span and concentration intact, recent and remote memory intact, fund of knowledge intact.  Speech fluent and not dysarthric, language intact.  CN II-XII intact. Fundoscopic exam unremarkable  without vessel changes, exudates, hemorrhages or papilledema.  Bulk and tone normal, muscle strength 5-/5 right interossei, otherwise 5/5 throughout.  Sensation to light touch intact.  Deep tendon reflexes 2+ throughout, toes downgoing.  Finger to nose and heel to shin testing intact.  Gait normal.  IMPRESSION: New Daily Persistent Headaches resolved. Cerebrovascular disease  PLAN: Continue nortriptyline 50mg  at bedtime Smoking cessation ASA 81mg  daily Follow up in 6 months.  Metta Clines, DO  CC: Owens Loffler, MD

## 2015-08-09 ENCOUNTER — Encounter: Payer: Self-pay | Admitting: Cardiovascular Disease

## 2015-08-09 ENCOUNTER — Ambulatory Visit (INDEPENDENT_AMBULATORY_CARE_PROVIDER_SITE_OTHER): Payer: 59 | Admitting: Cardiovascular Disease

## 2015-08-09 VITALS — BP 100/60 | HR 78 | Ht 65.0 in | Wt 148.8 lb

## 2015-08-09 DIAGNOSIS — F17200 Nicotine dependence, unspecified, uncomplicated: Secondary | ICD-10-CM

## 2015-08-09 DIAGNOSIS — Z720 Tobacco use: Secondary | ICD-10-CM

## 2015-08-09 DIAGNOSIS — K2100 Gastro-esophageal reflux disease with esophagitis, without bleeding: Secondary | ICD-10-CM

## 2015-08-09 DIAGNOSIS — Z299 Encounter for prophylactic measures, unspecified: Secondary | ICD-10-CM | POA: Insufficient documentation

## 2015-08-09 DIAGNOSIS — E785 Hyperlipidemia, unspecified: Secondary | ICD-10-CM | POA: Diagnosis not present

## 2015-08-09 DIAGNOSIS — R079 Chest pain, unspecified: Secondary | ICD-10-CM | POA: Diagnosis not present

## 2015-08-09 MED ORDER — ESOMEPRAZOLE MAGNESIUM 20 MG PO PACK: 20.0000 mg | PACK | Freq: Two times a day (BID) | ORAL | Status: DC

## 2015-08-09 MED ORDER — OMEPRAZOLE 40 MG PO CPDR: 40.0000 mg | DELAYED_RELEASE_CAPSULE | Freq: Two times a day (BID) | ORAL | Status: DC

## 2015-08-09 NOTE — Assessment & Plan Note (Signed)
Cholesterol reviewed, not particularly elevated, on no medication No new medication changes made. Wife prefers to manage this with over-the-counter medication

## 2015-08-09 NOTE — Patient Instructions (Addendum)
Try the higher dose nexium 20 mg twice a day (or omeprazole 40 mg twice a day) I will ask Dr. Carlis Abbott about Hpylori  Ask GI about EGD with your colo next time  Please call us if you have new issues that need to be addressed before your next appt.  Your physician wants you to follow-up in: 6 months.  You will receive a reminder letter in the mail two months in advance. If you don't receive a letter, please call our office to schedule the follow-up appointment.

## 2015-08-09 NOTE — Assessment & Plan Note (Addendum)
Symptoms as above, improvement on Nexium He will try Nexium 20 minute grams twice a day or omeprazole 40 Mill grams twice a day for a short period time depending on which is cheapest Suggested to him that he might benefit from H. Pylori testing, and EGD at the time of his next colonoscopy Will defer to primary care

## 2015-08-09 NOTE — Assessment & Plan Note (Signed)
We have encouraged him to continue to work on weaning his cigarettes and smoking cessation. He will continue to work on this and does not want any assistance with chantix.  

## 2015-08-09 NOTE — Progress Notes (Signed)
Patient ID: Isaiah Rangel, male    DOB: Aug 14, 1961, 54 y.o.   MRN: [id number]  HPI Comments: 54 year old gentleman who is very active at baseline who works as a Company secretary, history of smoking Who continues to smoke, GERD, remote neck injury with fusion surgery in 2002 with chronic neck pain and tightness, previous cardiac catheterization August 2014 showing minimal coronary artery disease, presenting for follow-up of chest pain symptoms  He reports that over the past several months, he has been having severe chest pain in his central chest Symptoms present at rest, with exertion, while in bed, getting out of bed Symptoms will last for several minutes at a time, previously 3-4 times per day Describes it as a spasm that grips him, 10 over 10 pain Sometimes with sweating, nausea Symptoms seem to get worse when he was decreasing his omeprazole dose and was trying to use H2 blockers only More recently started Nexium 20 mg daily with ranitidine and symptoms have dramatically improved Now has not had an episode since earlier in the week, 5 days ago. This is dramatically better for him.  EKG on today's visit shows normal sinus rhythm with rate 84 bpm, no significant ST or T-wave changes  Other past medical history a stress echocardiogram  was performed that showed no significant ischemia. He achieved a heart rate greater than 150. Cardiac enzymes were negative during his hospital stay.  He did have knee surgery since his last visit. He continues to smoke one pack per day. He has tried Chantix in the past though had severe vertigo. He does also have Raynaud's in his hands      No Known Allergies  Current Outpatient Prescriptions on File Prior to Visit  Medication Sig Dispense Refill  . acetaminophen (TYLENOL) 500 MG tablet Take 1,000 mg by mouth every 6 (six) hours as needed.      Marland Kitchen aspirin 81 MG chewable tablet Chew 81 mg by mouth daily.      Marland Kitchen ibuprofen (ADVIL,MOTRIN) 200 MG tablet Take  400 mg by mouth every 6 (six) hours as needed.      . Multiple Vitamin (MULTIVITAMIN) tablet Take 1 tablet by mouth daily.      . nortriptyline (PAMELOR) 25 MG capsule Take 1 capsule (25 mg total) by mouth at bedtime. (Patient taking differently: Take 50 mg by mouth at bedtime. ) 30 capsule 5  . Omega-3 Fatty Acids (FISH OIL) 1000 MG CAPS Take 1 capsule by mouth daily.     No current facility-administered medications on file prior to visit.    Past Medical History  Diagnosis Date  . OA (osteoarthritis)   . Current smoker   . GERD (gastroesophageal reflux disease)   . Anxiety   . BPH (benign prostatic hyperplasia)     Past Surgical History  Procedure Laterality Date  . Back surgery    . Neck fusion    . Nasal sinus surgery    . Knee arthroscopy w/ partial medial meniscectomy Right 2012    [place], 2012  . Vasectomy    . Cardiac catheterization  04/06/13    [facility]- minor luminal irregularities, otherwise normal cors; EF > 55%. Medical management and smoking cessation recommended    Social History  reports that he has been smoking Cigarettes.  He has a 30 pack-year smoking history. He has never used smokeless tobacco. He reports that he does not drink alcohol or use illicit drugs.  Family History family history includes Arthritis in his father; CVA (  age of onset: 80) in his father; Colon cancer in his mother; Coronary artery disease in his father; Coronary artery disease (age of onset: 54) in his brother; Heart attack in his brother and brother; Hypertension in his father. There is no history of Esophageal cancer or Stomach cancer.   Review of Systems  Constitutional: Negative.   Respiratory: Negative.   Cardiovascular: Positive for chest pain.  Gastrointestinal: Positive for nausea.  Musculoskeletal: Negative.   Neurological: Negative.   Psychiatric/Behavioral: Negative.   All other systems reviewed and are negative.   BP 100/60 mmHg  Pulse 78  Ht 5\' 5"  (1.651 m)  Wt 148 lb  12 oz (67.473 kg)  BMI 24.75 kg/m2  Physical Exam  Constitutional: He is oriented to person, place, and time. He appears well-developed and well-nourished.  HENT:  Head: Normocephalic.  Nose: Nose normal.  Mouth/Throat: Oropharynx is clear and moist.  Eyes: Conjunctivae are normal. Pupils are equal, round, and reactive to light.  Neck: Normal range of motion. Neck supple. No JVD present.  Cardiovascular: Normal rate, regular rhythm, S1 normal, S2 normal, normal heart sounds and intact distal pulses.  Exam reveals no gallop and no friction rub.   No murmur heard. Pulmonary/Chest: Effort normal. No respiratory distress. He has decreased breath sounds. He has no wheezes. He has no rales. He exhibits no tenderness.  Abdominal: Soft. Bowel sounds are normal. He exhibits no distension. There is no tenderness.  Musculoskeletal: Normal range of motion. He exhibits no edema or tenderness.  Lymphadenopathy:    He has no cervical adenopathy.  Neurological: He is alert and oriented to person, place, and time. Coordination normal.  Skin: Skin is warm and dry. No rash noted. No erythema.  Psychiatric: He has a normal mood and affect. His behavior is normal. Judgment and thought content normal.      Assessment and Plan   Nursing note and vitals reviewed.

## 2015-08-09 NOTE — Assessment & Plan Note (Signed)
Long discussion concerning risk factors and coronary artery disease. Recommended smoking cessation, Encouraged also to consider CT coronary calcium scoring

## 2015-08-09 NOTE — Assessment & Plan Note (Signed)
Etiology of his chest pain concerning for GERD, unable to exclude esophagitis or gastritis, even ulcer. Symptoms have improved on Nexium. Previously when weaning omeprazole, symptoms were getting worse. Now with Nexium and ranitidine daily, dramatic improvement of his symptoms. No further cardiac testing has been ordered given recent cardiac catheterization with no significant disease, improvement of his symptoms, also atypical presentation coming on at rest.   Suggested he try Nexium 20 mg twice a day or high-dose omeprazole 40 mg twice a day for a period of time. We'll discuss with Dr. Carlis Abbott, could consider H. Pylori testing. Also would consider EGD at the time of his next colonoscopy

## 2015-09-24 ENCOUNTER — Telehealth: Payer: Self-pay | Admitting: *Deleted

## 2015-09-24 MED ORDER — ESOMEPRAZOLE MAGNESIUM 20 MG PO CPDR: 20.0000 mg | DELAYED_RELEASE_CAPSULE | Freq: Two times a day (BID) | ORAL | Status: DC

## 2015-09-24 NOTE — Telephone Encounter (Signed)
PA required for Nexium 20 mg packets. Pharmacy requesting new Rx for generic capsules? Please advise.

## 2015-09-24 NOTE — Telephone Encounter (Signed)
Generic rx sent in.  

## 2015-09-26 ENCOUNTER — Telehealth: Payer: Self-pay | Admitting: Family Medicine

## 2015-09-26 ENCOUNTER — Other Ambulatory Visit: Payer: Self-pay | Admitting: Family Medicine

## 2015-09-26 DIAGNOSIS — K219 Gastro-esophageal reflux disease without esophagitis: Secondary | ICD-10-CM

## 2015-09-26 DIAGNOSIS — R0789 Other chest pain: Secondary | ICD-10-CM

## 2015-09-26 NOTE — Telephone Encounter (Signed)
Pt has appt scheduled 09/27/15 at 10:15 with Dr Glori Bickers.

## 2015-09-26 NOTE — Telephone Encounter (Signed)
I will see him then

## 2015-09-26 NOTE — Telephone Encounter (Signed)
Patient Name: Isaiah Rangel  DOB: 1961-06-30    Initial Comment caller states he is having acid reflux   Nurse Assessment  Nurse: Raphael Gibney, RN, Vanita Ingles Date/Time (Eastern Time): 09/26/2015 11:20:32 AM  Confirm and document reason for call. If symptomatic, describe symptoms. You must click the next button to save text entered. ---Caller states he had problems with acid reflux. Started last spring. Saw cardiologist in Dec and everything was normal. Burning in his chest wakes him 2-3 times a night. Pain is severe during the night. No pain now. Does a lot of burping and passing gas in the am. He got choked Tuesday am and felt like he could not breathe. He is taking what he thinks his prilosec BID.  Has the patient traveled out of the country within the last 30 days? ---No  Does the patient have any new or worsening symptoms? ---Yes  Will a triage be completed? ---Yes  Related visit to physician within the last 2 weeks? ---No  Does the PT have any chronic conditions? (i.e. diabetes, asthma, etc.) ---Yes  List chronic conditions. ---acid reflux  Is this a behavioral health or substance abuse call? ---No     Guidelines    Guideline Title Affirmed Question Affirmed Notes  Chest Pain [1] Chest pain lasting <= 5 minutes AND [2] NO chest pain or cardiac symptoms now (Exceptions: pains lasting a few seconds)    Final Disposition User   See Physician within [address], RN, [place]    Comments  Appt scheduled for 09/27/15 at 10:15 am with Dr. Loura Pardon.   Referrals  REFERRED TO PCP OFFICE   Disagree/Comply: Comply

## 2015-09-26 NOTE — Telephone Encounter (Signed)
Reasonable.

## 2015-09-26 NOTE — Telephone Encounter (Signed)
Pt called. Still having chest pain in middle of night waking him up. Went to cardiology and he is now on 2 prolosic in the morning and 2 at night, but pain still wakes him up. His wife is a Marine scientist and recommends he see GI for upper endoscopy. Pt is est at [facility]. Please advise.   Call was sent to [facility] bc pt states he really does know if its GERD or a heart attack.

## 2015-09-27 ENCOUNTER — Encounter: Payer: Self-pay | Admitting: Family Medicine

## 2015-09-27 ENCOUNTER — Ambulatory Visit (INDEPENDENT_AMBULATORY_CARE_PROVIDER_SITE_OTHER): Payer: 59 | Admitting: Family Medicine

## 2015-09-27 VITALS — BP 114/68 | HR 76 | Temp 97.8°F | Wt 150.5 lb

## 2015-09-27 DIAGNOSIS — Z720 Tobacco use: Secondary | ICD-10-CM | POA: Diagnosis not present

## 2015-09-27 DIAGNOSIS — F17200 Nicotine dependence, unspecified, uncomplicated: Secondary | ICD-10-CM

## 2015-09-27 DIAGNOSIS — K2100 Gastro-esophageal reflux disease with esophagitis, without bleeding: Secondary | ICD-10-CM

## 2015-09-27 MED ORDER — RANITIDINE HCL 150 MG PO CAPS: 150.0000 mg | ORAL_CAPSULE | Freq: Every evening | ORAL | Status: DC

## 2015-09-27 NOTE — Patient Instructions (Addendum)
Try to watch diet for foods and beverages that worsen reflux  Do not eat or drink right before bed  Continue nexium 40 mg twice daily  Add zantac (ranitidine) 150 mg in pm  You can try to elevate the head of your bed with some bricks if you can   Stop at check out for referral to GI (it looks like Dr Lorelei Pont already did it)

## 2015-09-27 NOTE — Progress Notes (Signed)
Pre visit review using our clinic review tool, if applicable. No additional management support is needed unless otherwise documented below in the visit note. 

## 2015-09-27 NOTE — Progress Notes (Signed)
Subjective:    Patient ID: Isaiah Rangel, male    DOB: 01/20/1961, 55 y.o.   MRN: [id number]  HPI Here with GERD   CP in the past - saw Dr Rockey Situ - tx for gerd helped tremendously   Has been on nexium 40 mg - used to work really well  Now symptoms have returned incl belching/dry cough and even some nausea, heartburn-- Wed and Thursday of this week  Right now feels like he has a lump in his throat  He even choked on a swallow of coffee   Sometimes swallows and it feels like it gets stuck 1/2 way down   No blood in stool or dark stool  Not a lot of epigastric pain   Is a smoker - has cut way back - by 1/2  Is a little less than 1/2 ppd   Taking nexium 40 bid   Tums and zantac do not help    Dr Rockey Situ px omeprazole 40 - waiting for prior auth   Eats very healthy diet  Avoids fried food  Avoid spicy / acidic - but does drink coffee 2-3 cups of decaf per day    Patient Active Problem List   Diagnosis Date Noted  . Encounter for preventive measure 08/09/2015  . Chest pain 06/27/2015  . New daily persistent headache 04/29/2015  . Cerebrovascular disease 04/29/2015  . Dizziness and giddiness 04/29/2015  . Tobacco abuse 04/29/2015  . Radicular pain of right lower back 12/05/2013  . S/P cardiac catheterization 04/19/2013  . Benign paroxysmal positional vertigo 02/04/2013  . Smoking 09/09/2011  . Hyperlipidemia 04/09/2010  . GERD 09/21/2008  . ERECTILE DYSFUNCTION 09/20/2008  . FATIGUE 09/20/2008   Past Medical History  Diagnosis Date  . OA (osteoarthritis)   . Current smoker   . GERD (gastroesophageal reflux disease)   . Anxiety   . BPH (benign prostatic hyperplasia)    Past Surgical History  Procedure Laterality Date  . Back surgery    . Neck fusion    . Nasal sinus surgery    . Knee arthroscopy w/ partial medial meniscectomy Right 2012    [place], 2012  . Vasectomy    . Cardiac catheterization  04/06/13    [facility]- minor luminal irregularities, otherwise  normal cors; EF > 55%. Medical management and smoking cessation recommended   Social History  Substance Use Topics  . Smoking status: Current Every Day Smoker -- 1.00 packs/day for 30 years    Types: Cigarettes  . Smokeless tobacco: Never Used     Comment: he is aware he needs to quit   . Alcohol Use: No   Family History  Problem Relation Age of Onset  . Arthritis Father   . Coronary artery disease Father   . Hypertension Father   . Coronary artery disease Brother 60  . Colon cancer Mother   . Esophageal cancer Neg Hx   . Stomach cancer Neg Hx   . Heart attack Brother   . Heart attack Brother   . CVA Father 49   No Known Allergies Current Outpatient Prescriptions on File Prior to Visit  Medication Sig Dispense Refill  . acetaminophen (TYLENOL) 500 MG tablet Take 1,000 mg by mouth every 6 (six) hours as needed.      Marland Kitchen aspirin 81 MG chewable tablet Chew 81 mg by mouth daily.      Marland Kitchen esomeprazole (NEXIUM) 20 MG capsule Take 1 capsule (20 mg total) by mouth 2 (two) times daily before  a meal. 60 capsule 6  . ibuprofen (ADVIL,MOTRIN) 200 MG tablet Take 400 mg by mouth every 6 (six) hours as needed.      . Multiple Vitamin (MULTIVITAMIN) tablet Take 1 tablet by mouth daily.      . nortriptyline (PAMELOR) 25 MG capsule Take 1 capsule (25 mg total) by mouth at bedtime. (Patient taking differently: Take 50 mg by mouth at bedtime. ) 30 capsule 5  . Omega-3 Fatty Acids (FISH OIL) 1000 MG CAPS Take 1 capsule by mouth daily.    Marland Kitchen omeprazole (PRILOSEC) 40 MG capsule Take 1 capsule (40 mg total) by mouth 2 (two) times daily. (Patient not taking: Reported on 09/27/2015) 60 capsule 6   No current facility-administered medications on file prior to visit.     Review of Systems Review of Systems  Constitutional: Negative for fever, appetite change, fatigue and unexpected weight change.  Eyes: Negative for pain and visual disturbance.  Respiratory: Negative for cough and shortness of breath.     Cardiovascular: Negative for cp or palpitations    Gastrointestinal: Negative for , diarrhea and constipation. pos for heartburn and cough/throat clearing and dysphagia  Genitourinary: Negative for urgency and frequency.  Skin: Negative for pallor or rash   Neurological: Negative for weakness, light-headedness, numbness and headaches.  Hematological: Negative for adenopathy. Does not bruise/bleed easily.  Psychiatric/Behavioral: Negative for dysphoric mood. The patient is not nervous/anxious.         Objective:   Physical Exam  Constitutional: He appears well-developed and well-nourished. No distress.  HENT:  Head: Normocephalic and atraumatic.  Mouth/Throat: Oropharynx is clear and moist.  Eyes: Conjunctivae and EOM are normal. Pupils are equal, round, and reactive to light. No scleral icterus.  Neck: Normal range of motion. Neck supple.  Cardiovascular: Normal rate, regular rhythm and normal heart sounds.   Pulmonary/Chest: Effort normal and breath sounds normal. No respiratory distress. He has no wheezes. He has no rales. He exhibits no tenderness.  Abdominal: Soft. Bowel sounds are normal. He exhibits no distension, no abdominal bruit and no mass. There is no hepatosplenomegaly. There is no tenderness. There is no rebound, no guarding, no CVA tenderness and negative Murphy's sign.  Lymphadenopathy:    He has no cervical adenopathy.  Neurological: He is alert.  Skin: Skin is warm and dry. No erythema. No pallor.  Psychiatric: He has a normal mood and affect.          Assessment & Plan:   Problem List Items Addressed This Visit      Digestive   GERD - Primary    Worsening symptoms and even some dysphagia lately  inst to continue nexium bid  Given diet info re: what to avoid Enc to prop up head of bed Add zantac 150 mg at bedtme  Ref to GI was made-will try to arrange that today- suspect he will need EGD Also enc strongly to work on smoking cessation further        Relevant Medications   ranitidine (ZANTAC) 150 MG capsule     Other   Smoking    Enc pt to quit Disc in detail risks of smoking and possible outcomes including copd, vascular/ heart disease, cancer , respiratory and sinus infections (and GERD) Pt voices understanding He is working hard at it and has calmed down

## 2015-09-29 NOTE — Assessment & Plan Note (Signed)
Enc pt to quit Disc in detail risks of smoking and possible outcomes including copd, vascular/ heart disease, cancer , respiratory and sinus infections (and GERD) Pt voices understanding He is working hard at it and has calmed down

## 2015-09-29 NOTE — Assessment & Plan Note (Addendum)
Worsening symptoms and even some dysphagia lately  inst to continue nexium bid  Given diet info re: what to avoid Enc to prop up head of bed Add zantac 150 mg at bedtme  Ref to GI was made-will try to arrange that today- suspect he will need EGD Also enc strongly to work on smoking cessation further

## 2015-10-09 ENCOUNTER — Telehealth: Payer: Self-pay | Admitting: *Deleted

## 2015-10-09 MED ORDER — ESOMEPRAZOLE MAGNESIUM 20 MG PO CPDR: 20.0000 mg | DELAYED_RELEASE_CAPSULE | Freq: Two times a day (BID) | ORAL | Status: DC

## 2015-10-09 NOTE — Telephone Encounter (Signed)
Refill sent for Nexium 20 mg 90 day supply with 3 refills.

## 2015-10-09 NOTE — Telephone Encounter (Signed)
*  STAT* If patient is at the pharmacy, call can be transferred to refill team.   1. Which medications need to be refilled? (please list name of each medication and dose if known) esomeprazole (NEXIUM) 20 MG capsule   2. Which pharmacy/location (including street and city if local pharmacy) is medication to be sent to? [facility] on [address]   3. Do they need a 30 day or 90 day supply? 90 day   Needs prior auth through [facility]. Medicine is too expensive

## 2015-10-24 ENCOUNTER — Other Ambulatory Visit: Payer: Self-pay | Admitting: Physician Assistant

## 2015-10-24 ENCOUNTER — Encounter: Payer: Self-pay | Admitting: Physician Assistant

## 2015-10-24 ENCOUNTER — Ambulatory Visit (INDEPENDENT_AMBULATORY_CARE_PROVIDER_SITE_OTHER): Payer: 59 | Admitting: Physician Assistant

## 2015-10-24 VITALS — BP 116/72 | HR 84 | Ht 65.0 in | Wt 153.0 lb

## 2015-10-24 DIAGNOSIS — R110 Nausea: Secondary | ICD-10-CM | POA: Diagnosis not present

## 2015-10-24 DIAGNOSIS — K219 Gastro-esophageal reflux disease without esophagitis: Secondary | ICD-10-CM

## 2015-10-24 DIAGNOSIS — R0789 Other chest pain: Secondary | ICD-10-CM

## 2015-10-24 MED ORDER — HYOSCYAMINE SULFATE 0.125 MG SL SUBL
SUBLINGUAL_TABLET | SUBLINGUAL | Status: DC
Start: 2015-10-24 — End: 2015-10-24

## 2015-10-24 MED ORDER — PANTOPRAZOLE SODIUM 40 MG PO TBEC: 40.0000 mg | DELAYED_RELEASE_TABLET | Freq: Two times a day (BID) | ORAL | Status: DC

## 2015-10-24 NOTE — Progress Notes (Signed)
Reviewed and agree with management plan.  Malcolm T. Stark, MD FACG 

## 2015-10-24 NOTE — Patient Instructions (Signed)
We sent prescriptions to [facility], [place], [place].  1. Protonix 40 mg 2. Levsin SL.   You have been scheduled for an abdominal ultrasound at [facility] Radiology (1st floor of hospital) on 10-28-2015 at 8:00 am. Please arrive 15 minutes prior to your appointment for registration. Make certain not to have anything to eat or drink 6 hours prior to your appointment. Should you need to reschedule your appointment, please contact radiology at [phone]. This test typically takes about 30 minutes to perform.  You have been scheduled for an endoscopy. Please follow written instructions given to you at your visit today. If you use inhalers (even only as needed), please bring them with you on the day of your procedure. Your physician has requested that you go to www.startemmi.com and enter the access code given to you at your visit today. This web site gives a general overview about your procedure. However, you should still follow specific instructions given to you by our office regarding your preparation for the procedure.  You have been scheduled for an esophageal manometry at [facility]oscopy on 10-30-2015 at 10:30 am . Please arrive 30 minutes prior to your procedure for registration. You will need to go to outpatient registration (1st floor of the hospital) first. Make certain to bring your insurance cards as well as a complete list of medications.  Please remember the following:  1) Nothing to eat or drink after 5:30 am  on the night before your test.  2) Hold all diabetic medications/insulin the morning of the test. You may eat and take your medications after the test.  3) For 3 days prior to your test do not take: Dexilant, Prevacid, Nexium, Protonix,   Aciphex, Zegerid, Pantoprazole, Prilosec or omeprazole.  4) For 2 days prior to your test, do not take: Reglan, Tagamet, Zantac, Axid or Pepcid.  5) You MAY use an antacid such as Rolaids or Tums up to 12 hours prior to your  test.  It will take at least 2 weeks to receive the results of this test from your physician. ------------------------------------------ ABOUT ESOPHAGEAL MANOMETRY Esophageal manometry (muh-NOM-uh-tree) is a test that gauges how well your esophagus works. Your esophagus is the long, muscular tube that connects your throat to your stomach. Esophageal manometry measures the rhythmic muscle contractions (peristalsis) that occur in your esophagus when you swallow. Esophageal manometry also measures the coordination and force exerted by the muscles of your esophagus.  During esophageal manometry, a thin, flexible tube (catheter) that contains sensors is passed through your nose, down your esophagus and into your stomach. Esophageal manometry can be helpful in diagnosing some mostly uncommon disorders that affect your esophagus.  Why it's done Esophageal manometry is used to evaluate the movement (motility) of food through the esophagus and into the stomach. The test measures how well the circular bands of muscle (sphincters) at the top and bottom of your esophagus open and close, as well as the pressure, strength and pattern of the wave of esophageal muscle contractions that moves food along.  What you can expect Esophageal manometry is an outpatient procedure done without sedation. Most people tolerate it well. You may be asked to change into a hospital gown before the test starts.  During esophageal manometry  While you are sitting up, a member of your health care team sprays your throat with a numbing medication or puts numbing gel in your nose or both.  A catheter is guided through your nose into your esophagus. The  catheter may be sheathed in a water-filled sleeve. It doesn't interfere with your breathing. However, your eyes may water, and you may gag. You may have a slight nosebleed from irritation.  After the catheter is in place, you may be asked to lie on your back on an exam table, or you may be  asked to remain seated.  You then swallow small sips of water. As you do, a computer connected to the catheter records the pressure, strength and pattern of your esophageal muscle contractions.  During the test, you'll be asked to breathe slowly and smoothly, remain as still as possible, and swallow only when you're asked to do so.  A member of your health care team may move the catheter down into your stomach while the catheter continues its measurements.  The catheter then is slowly withdrawn. The test usually lasts 20 to 30 minutes.  After esophageal manometry  When your esophageal manometry is complete, you may return to your normal activities  This test typically takes 30-45 minutes to complete. ________________________________________________________________________________

## 2015-10-24 NOTE — Progress Notes (Signed)
Patient ID: Isaiah Rangel, male   DOB: 20-Aug-1961, 55 y.o.   MRN: [id number]   Subjective:    Patient ID: Isaiah Rangel, male    DOB: [birth year], 55 y.o.   MRN: [id number]  HPI Isaiah Rangel  is a pleasant 55 year old white male known to Dr. Fuller Plan from prior colonoscopy who comes in today for evaluation of recurrent episodes of severe chest pain. He is referred by Dr. Glori Bickers. Patient had colonoscopy in November 2012 with finding of multiple colon polyps the largest measuring 5-6 mm. These were all removed and passed showed them all to be hyperplastic. He does have family history of colon cancer in his mother and therefore was recommended to have follow-up colonoscopy at a 5 year interval. Patient has not had prior EGD. His current symptoms started in the fall of 2016. He said he had one episode a couple of years ago but then hadn't had any recurrences until fall of 2016. He says he gets abrupt onset of mid chest pain she describes as a heavy pressure-type feeling rating a 9 out of 10 and says this "stopped him in his tracks" . He usually has to sit down and concentrate on taking deep breaths. He frequently becomes nauseated with these episodes and intermittently will break out into a cold sweat. He says the symptoms last 5-10 minutes and then resolved and he is fine until he has the next episode. He is on bad days currently he may have 2 or 3 episodes in 1 day and then may have a day without any episodes. The pain does not seem to be associated with by mouth intake and has occurred both with exertion and at rest and has awakened him from sleep. He has no complaints of heartburn or indigestion in between these episodes ,no abdominal pain. He did undergo a cardiac workup in 2014 because of a similar episode. This was done by Dr. Rockey Situ,. He had 2-D echo with normal EF, a myocardial perfusion study was done showing questionable anterior wall defect and then subsequent cardiac cath done in August 2014 showed minimal  luminal irregularities and no significant coronary disease. Chest x-ray pertinent for COPD. Patient had been on chronic PPI therapy and had been trying to wean off of it. His girlfriend states that when he about weaned off the PPIs when his symptoms worsened in the fall. He then started taking Nexium 20 mg by mouth daily that helped for a little while and then symptoms worsened the increase the Nexium to 40 twice a day and added Zantac at bedtime and did this for a month. They both state that this seemed to perhaps decrease the frequency up at of episodes but did not prevent them. He is currently taking OTC Nexium twice daily and Zantac at bedtime.  Review of Systems Pertinent positive and negative review of systems were noted in the above HPI section.  All other review of systems was otherwise negative.    Outpatient Encounter Prescriptions as of 10/24/2015  Medication Sig  . acetaminophen (TYLENOL) 500 MG tablet Take 1,000 mg by mouth every 6 (six) hours as needed.    Marland Kitchen aspirin EC 81 MG tablet Take 81 mg by mouth daily.  . calcium carbonate (TUMS - DOSED IN MG ELEMENTAL CALCIUM) 500 MG chewable tablet Chew 2 tablets by mouth daily.  Marland Kitchen esomeprazole (NEXIUM) 20 MG capsule Take 1 capsule (20 mg total) by mouth 2 (two) times daily before a meal.  . ibuprofen (ADVIL,MOTRIN) 200 MG tablet  Take 400 mg by mouth every 6 (six) hours as needed.    . Multiple Vitamin (MULTIVITAMIN) tablet Take 1 tablet by mouth daily.    . nortriptyline (PAMELOR) 25 MG capsule Take 1 capsule (25 mg total) by mouth at bedtime. (Patient taking differently: Take 50 mg by mouth at bedtime. )  . Omega-3 Fatty Acids (FISH OIL) 1000 MG CAPS Take 1 capsule by mouth daily.  . ranitidine (ZANTAC) 150 MG capsule Take 1 capsule (150 mg total) by mouth every evening. (Patient taking differently: Take 150 mg by mouth at bedtime. )  . [DISCONTINUED] aspirin 81 MG chewable tablet Chew 81 mg by mouth daily.    . hyoscyamine (LEVSIN/SL) 0.125  MG SL tablet Place 1 tab under the tongue as needed for chest pain/spasms.  . pantoprazole (PROTONIX) 40 MG tablet Take 1 tablet (40 mg total) by mouth 2 (two) times daily.  . [DISCONTINUED] omeprazole (PRILOSEC) 40 MG capsule Take 1 capsule (40 mg total) by mouth 2 (two) times daily. (Patient not taking: Reported on 09/27/2015)   No facility-administered encounter medications on file as of 10/24/2015.   No Known Allergies Patient Active Problem List   Diagnosis Date Noted  . Encounter for preventive measure 08/09/2015  . Chest pain 06/27/2015  . New daily persistent headache 04/29/2015  . Cerebrovascular disease 04/29/2015  . Dizziness and giddiness 04/29/2015  . Tobacco abuse 04/29/2015  . Radicular pain of right lower back 12/05/2013  . S/P cardiac catheterization 04/19/2013  . Benign paroxysmal positional vertigo 02/04/2013  . Smoking 09/09/2011  . Hyperlipidemia 04/09/2010  . GERD 09/21/2008  . ERECTILE DYSFUNCTION 09/20/2008  . FATIGUE 09/20/2008   Social History   Social History  . Marital Status: Married    Spouse Name: N/A  . Number of Children: N/A  . Years of Education: N/A   Occupational History  . [employer]    Social History Main Topics  . Smoking status: Current Every Day Smoker -- 1.00 packs/day for 30 years    Types: Cigarettes  . Smokeless tobacco: Never Used     Comment: he is aware he needs to quit   . Alcohol Use: No  . Drug Use: No  . Sexual Activity:    Partners: Female   Other Topics Concern  . 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 Lister Lookabaugh([employer])      Likes to work out       Lives with GF    Mr. Barthelemy's family history includes Arthritis in his father; CVA (age of onset: 93) in his father; Colon cancer in his mother; Coronary artery disease in his father; Coronary artery disease (age of onset: 7) in his brother; Heart attack in his brother and brother; Hypertension in his father. There is no history of Esophageal  cancer or Stomach cancer.      Objective:    Filed Vitals:   10/24/15 0900  BP: 116/72  Pulse: 84    Physical Exam  well-developed white male in no acute distress, accompanied by his friend, blood pressure 116/72 pulse 84 height 5 foot 5 weight 153. HEENT; nontraumatic, cephalic EOMI PERRLA sclera anicteric, Cardiovascular ;regular rate and rhythm with S1-S2 no murmur or gallop, Pulmonary; decreased breath sounds bilaterally, no chest wall pain or tenderness Abdomen ;soft nontender nondistended bowel sounds are active is no palpable mass or hepatosplenomegaly; on Rectal ;exam not done, Extremities ;no clubbing cyanosis or edema skin warm and dry, Neuropsych; mood and affect  appropriate     Assessment & Plan:   #1 55 yo WM with 6 month hx of intermittent severe chest pain/pressure with episodes lasting 5-10 minutes and associated with nausea and sometimes diaphoresis Some improvement with high dose acid suppression This is not typical GERD - will need to r/o esophageal spasm -DES, nutcracker esophagus Also ? if he could have coronary spasm R/O biliary colic though doubt  #2 Family hx of colon cancer - mother- due for Follow up colonoscopy this year   #3 personal hx of multiple hyperplastic  Polyps  Plan; Will  Switch to protonix 40 mg po BID ( insurance wouldn't pay for nexium) Continue Zantac 150 at hs Schedule for EGD with Dr Fuller Plan - procedure discussed in detail with patient and he is agreeable to proceed.  He is also due for colonoscopy this year and this can be done at the same time as EGD. Schedule for esophageal manometry Schedule for upper abdominal ultrasound. We'll give him a trial of Levsin sublingual to use when necessary. If  GI workup is unrevealing would refer back to cardiology for consideration of Prinzmetal's angina    Isaiah Rangel S Kupono Marling PA-C 10/24/2015   Cc: Owens Loffler, MD

## 2015-10-28 ENCOUNTER — Ambulatory Visit (HOSPITAL_COMMUNITY)
Admission: RE | Admit: 2015-10-28 | Discharge: 2015-10-28 | Disposition: A | Discharge status: Home / Self Care | Payer: 59 | Source: Ambulatory Visit | Attending: Physician Assistant | Admitting: Physician Assistant

## 2015-10-28 DIAGNOSIS — R110 Nausea: Secondary | ICD-10-CM

## 2015-10-28 DIAGNOSIS — R0789 Other chest pain: Secondary | ICD-10-CM

## 2015-10-28 DIAGNOSIS — N281 Cyst of kidney, acquired: Secondary | ICD-10-CM | POA: Insufficient documentation

## 2015-10-28 DIAGNOSIS — R079 Chest pain, unspecified: Secondary | ICD-10-CM | POA: Diagnosis present

## 2015-10-29 ENCOUNTER — Telehealth: Payer: Self-pay | Admitting: Gastroenterology

## 2015-10-29 NOTE — Telephone Encounter (Signed)
Patient advised that Virgel Gess was cancelled.

## 2015-10-30 ENCOUNTER — Encounter (HOSPITAL_COMMUNITY): Admission: RE | Payer: Self-pay | Source: Ambulatory Visit

## 2015-10-30 ENCOUNTER — Ambulatory Visit (HOSPITAL_COMMUNITY): Admission: RE | Admit: 2015-10-30 | Payer: 59 | Source: Ambulatory Visit | Admitting: Gastroenterology

## 2015-10-30 SURGERY — MANOMETRY, ESOPHAGUS

## 2015-11-28 ENCOUNTER — Ambulatory Visit (AMBULATORY_SURGERY_CENTER): Payer: 59 | Admitting: Gastroenterology

## 2015-11-28 ENCOUNTER — Encounter: Payer: Self-pay | Admitting: Gastroenterology

## 2015-11-28 ENCOUNTER — Telehealth: Payer: Self-pay | Admitting: Family Medicine

## 2015-11-28 VITALS — BP 100/80 | HR 77 | Temp 97.3°F | Resp 17 | Ht 65.0 in | Wt 153.0 lb

## 2015-11-28 DIAGNOSIS — K219 Gastro-esophageal reflux disease without esophagitis: Secondary | ICD-10-CM

## 2015-11-28 DIAGNOSIS — R079 Chest pain, unspecified: Secondary | ICD-10-CM | POA: Diagnosis not present

## 2015-11-28 DIAGNOSIS — R110 Nausea: Secondary | ICD-10-CM | POA: Diagnosis present

## 2015-11-28 MED ORDER — SODIUM CHLORIDE 0.9 % IV SOLN: 500.0000 mL | INTRAVENOUS | Status: DC

## 2015-11-28 NOTE — Telephone Encounter (Signed)
ok 

## 2015-11-28 NOTE — Patient Instructions (Signed)
YOU HAD AN ENDOSCOPIC PROCEDURE TODAY AT [facility]:   Refer to the procedure report that was given to you for any specific questions about what was found during the examination.  If the procedure report does not answer your questions, please call your gastroenterologist to clarify.  If you requested that your care partner not be given the details of your procedure findings, then the procedure report has been included in a sealed envelope for you to review at your convenience later.  YOU SHOULD EXPECT: Some feelings of bloating in the abdomen. Passage of more gas than usual.  Walking can help get rid of the air that was put into your GI tract during the procedure and reduce the bloating. If you had a lower endoscopy (such as a colonoscopy or flexible sigmoidoscopy) you may notice spotting of blood in your stool or on the toilet paper. If you underwent a bowel prep for your procedure, you may not have a normal bowel movement for a few days.  Please Note:  You might notice some irritation and congestion in your nose or some drainage.  This is from the oxygen used during your procedure.  There is no need for concern and it should clear up in a day or so.  SYMPTOMS TO REPORT IMMEDIATELY:    Following upper endoscopy (EGD)  Vomiting of blood or coffee ground material  New chest pain or pain under the shoulder blades  Painful or persistently difficult swallowing  New shortness of breath  Fever of 100F or higher  Black, tarry-looking stools  For urgent or emergent issues, a gastroenterologist can be reached at any hour by calling [phone].   DIET: Your first meal following the procedure should be a small meal and then it is ok to progress to your normal diet. Heavy or fried foods are harder to digest and may make you feel nauseous or bloated.  Likewise, meals heavy in dairy and vegetables can increase bloating.  Drink plenty of fluids but you should avoid alcoholic beverages  for 24 hours.  ACTIVITY:  You should plan to take it easy for the rest of today and you should NOT DRIVE or use heavy machinery until tomorrow (because of the sedation medicines used during the test).    FOLLOW UP: Our staff will call the number listed on your records the next business day following your procedure to check on you and address any questions or concerns that you may have regarding the information given to you following your procedure. If we do not reach you, we will leave a message.  However, if you are feeling well and you are not experiencing any problems, there is no need to return our call.  We will assume that you have returned to your regular daily activities without incident.  If any biopsies were taken you will be contacted by phone or by letter within the next 1-3 weeks.  Please call us at [phone] if you have not heard about the biopsies in 3 weeks.    SIGNATURES/CONFIDENTIALITY: You and/or your care partner have signed paperwork which will be entered into your electronic medical record.  These signatures attest to the fact that that the information above on your After Visit Summary has been reviewed and is understood.  Full responsibility of the confidentiality of this discharge information lies with you and/or your care-partner.  You might want to go back to the Cardiologist per Dr. Fuller Plan to be re-evaluated if the chest pain  continues.   Read the handouts given to you by your recovery room nurse.

## 2015-11-28 NOTE — Progress Notes (Signed)
Patient denies any allergies to eggs or soy. 

## 2015-11-28 NOTE — Progress Notes (Signed)
A/ox3 pleased with MAC, report to [facility], teeth as pre-procedure

## 2015-11-28 NOTE — Op Note (Signed)
[name] Procedure Date: 11/28/2015 9:32 AM MRN: [id number] Endoscopist: Ladene Artist , MD Age: 55 Referring MD:  Date of Birth: 07/21/1961 Gender: Male Procedure:                Upper GI endoscopy Indications:              Suspected gastro-esophageal reflux disease,                            Unexplained chest pain, intermittent N/V Medicines:                Monitored Anesthesia Care Procedure:                Pre-Anesthesia Assessment:                           - Prior to the procedure, a History and Physical                            was performed, and patient medications and                            allergies were reviewed. The patient's tolerance of                            previous anesthesia was also reviewed. The risks                            and benefits of the procedure and the sedation                            options and risks were discussed with the patient.                            All questions were answered, and informed consent                            was obtained. Prior Anticoagulants: The patient has                            taken no previous anticoagulant or antiplatelet                            agents. ASA Grade Assessment: II - A patient with                            mild systemic disease. After reviewing the risks                            and benefits, the patient was deemed in                            satisfactory condition to undergo the procedure.  After obtaining informed consent, the endoscope was                            passed under direct vision. Throughout the                            procedure, the patient's blood pressure, pulse, and                            oxygen saturations were monitored continuously. The                            Model GIF-HQ190 [id number]) scope was introduced                            through the mouth, and advanced to the  second part                            of duodenum. The upper GI endoscopy was                            accomplished without difficulty. The patient                            tolerated the procedure well. Scope In: Scope Out: Findings:      The esophagus was normal.      The stomach was normal.      The examined duodenum was normal.      The cardia and gastric fundus were normal on retroflexion. Complications:            No immediate complications. Estimated Blood Loss:     Estimated blood loss: none. Impression:               - Normal EGD Recommendation:           - Patient has a contact number available for                            emergencies. The signs and symptoms of potential                            delayed complications were discussed with the                            patient. Return to normal activities tomorrow.                            Written discharge instructions were provided to the                            patient.                           - Resume previous diet. Closely follow all  antireflux measures                           - Continue present medications including PPI bid                            and H2RA hs. Procedure Code(s):        --- Professional ---                           [phone], Esophagogastroduodenoscopy, flexible,                            transoral; diagnostic, including collection of                            specimen(s) by brushing or washing, when performed                            (separate procedure) CPT copyright 2016 American Medical Association. All rights reserved. Ladene Artist, MD 11/28/2015 9:59:58 AM This report has been signed electronically. Number of Addenda: 0 Referring MD:      Owens Loffler, MD

## 2015-11-28 NOTE — Telephone Encounter (Signed)
Patient has had the upper Endoscopy and it came out fine.  So he was informed to talk to his PCP about his heart.  An appt was made for 12/02/15 at 9:30am.  Patient stated if Dr. Lorelei Pont needed to contact him before the appointment to please call.

## 2015-11-28 NOTE — Progress Notes (Signed)
Dental advisory given to patient, missing front upper Left tooth

## 2015-11-29 ENCOUNTER — Telehealth: Payer: Self-pay | Admitting: *Deleted

## 2015-11-29 NOTE — Telephone Encounter (Signed)
  Follow up Call-  Call back number 11/28/2015  Post procedure Call Back phone  # [phone]  Permission to leave phone message Yes     Patient questions:  Do you have a fever, pain , or abdominal swelling? No. Pain Score  0 *  Have you tolerated food without any problems? Yes.    Have you been able to return to your normal activities? Yes.    Do you have any questions about your discharge instructions: Diet   No. Medications  No. Follow up visit  No.  Do you have questions or concerns about your Care? No.  Actions: * If pain score is 4 or above: No action needed, pain <4.

## 2015-12-02 ENCOUNTER — Encounter: Payer: Self-pay | Admitting: Family Medicine

## 2015-12-02 ENCOUNTER — Ambulatory Visit (INDEPENDENT_AMBULATORY_CARE_PROVIDER_SITE_OTHER): Payer: 59 | Admitting: Family Medicine

## 2015-12-02 VITALS — BP 100/70 | HR 73 | Temp 98.2°F | Ht 65.0 in | Wt 153.5 lb

## 2015-12-02 DIAGNOSIS — R002 Palpitations: Secondary | ICD-10-CM | POA: Diagnosis not present

## 2015-12-02 DIAGNOSIS — R0789 Other chest pain: Secondary | ICD-10-CM | POA: Diagnosis not present

## 2015-12-02 MED ORDER — ISOSORBIDE MONONITRATE ER 30 MG PO TB24: 30.0000 mg | ORAL_TABLET | Freq: Every day | ORAL | Status: DC

## 2015-12-02 NOTE — Progress Notes (Signed)
Pre visit review using our clinic review tool, if applicable. No additional management support is needed unless otherwise documented below in the visit note. 

## 2015-12-02 NOTE — Patient Instructions (Signed)

## 2015-12-02 NOTE — Progress Notes (Signed)
Dr. [name] T. Shyheim Tanney, MD, [facility] [place], [address] Phone: [phone] Fax: [phone]  12/02/2015  Patient: Isaiah Rangel, MRN: [id number], DOB: 01-28-1961, 55 y.o.  Primary Physician:  Owens Loffler, MD   Chief Complaint  Patient presents with  . Chest Pain    Comes and Goes-Feels like he hasn't gotten any anwers why from Cards or GI   Subjective:   Isaiah Rangel is a 55 y.o. very pleasant male patient who presents with the following:  F/u chest pain: Upper endoscopy recently normal.   He is been having on and off chest pain intermittently for 2 years.  He has had an extensive workup including cardiac catheterization which yielded no significant coronary disease, normal ejection fraction, and this was done 2-1/2 years ago.  He more recently saw gastroenterology and he had a normal upper endoscopy without any signs of ulcer, erosions, or signs of significant reflux.  He has extensive experience working out and with lifting weights as well as rockclimbing, and he doesn't feel like this is muscular at all.  It is not reproducible.  He does have substernal chest pain that will come and go with exertion and without exertion, sometimes when he is at rest.  He does not feel particularly anxious, and does not feel like this is an anxiety attack or panic attack at all.  Past Medical History, Surgical History, Social History, Family History, Problem List, Medications, and Allergies have been reviewed and updated if relevant.  Patient Active Problem List   Diagnosis Date Noted  . Encounter for preventive measure 08/09/2015  . Chest pain 06/27/2015  . New daily persistent headache 04/29/2015  . Cerebrovascular disease 04/29/2015  . Dizziness and giddiness 04/29/2015  . Tobacco abuse 04/29/2015  . Radicular pain of right lower back 12/05/2013  . S/P cardiac catheterization 04/19/2013  . Benign paroxysmal  positional vertigo 02/04/2013  . Smoking 09/09/2011  . Hyperlipidemia 04/09/2010  . GERD 09/21/2008  . ERECTILE DYSFUNCTION 09/20/2008  . FATIGUE 09/20/2008    Past Medical History  Diagnosis Date  . OA (osteoarthritis)   . Current smoker   . GERD (gastroesophageal reflux disease)   . Anxiety   . BPH (benign prostatic hyperplasia)     Past Surgical History  Procedure Laterality Date  . Back surgery    . Neck fusion    . Nasal sinus surgery    . Knee arthroscopy w/ partial medial meniscectomy Right 2012    [place], 2012  . Vasectomy    . Cardiac catheterization  04/06/13    [facility]- minor luminal irregularities, otherwise normal cors; EF > 55%. Medical management and smoking cessation recommended    Social History   Social History  . Marital Status: Married    Spouse Name: N/A  . Number of Children: N/A  . Years of Education: N/A   Occupational History  . [employer]    Social History Main Topics  . Smoking status: Current Every Day Smoker -- 1.00 packs/day for 30 years    Types: Cigarettes  . Smokeless tobacco: Never Used     Comment: he is aware he needs to quit   . Alcohol Use: No  . Drug Use: No  . Sexual Activity:    Partners: Female   Other Topics Concern  . 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 Lister Tamblyn([employer])      Likes to work  out       Lives with GF    Family History  Problem Relation Age of Onset  . Arthritis Father   . Coronary artery disease Father   . Hypertension Father   . Coronary artery disease Brother 40  . Colon cancer Mother   . Esophageal cancer Neg Hx   . Stomach cancer Neg Hx   . Heart attack Brother   . Heart attack Brother   . CVA Father 20    No Known Allergies  Medication list reviewed and updated in full in [facility].   GEN: No acute illnesses, no fevers, chills. GI: No n/v/d, eating normally Pulm: No SOB Interactive and getting along well at home.  Otherwise, ROS is as per the  HPI.  Objective:   BP 100/70 mmHg  Pulse 73  Temp(Src) 98.2 F (36.8 C) (Oral)  Ht 5\' 5"  (1.651 m)  Wt 153 lb 8 oz (69.627 kg)  BMI 25.54 kg/m2  SpO2 97%  GEN: WDWN, NAD, Non-toxic, A & O x 3 HEENT: Atraumatic, Normocephalic. Neck supple. No masses, No LAD. Ears and Nose: No external deformity. CV: RRR, No M/G/R. No JVD. No thrill. No extra heart sounds. PULM: CTA B, no wheezes, crackles, rhonchi. No retractions. No resp. distress. No accessory muscle use. EXTR: No c/c/e NEURO Normal gait.  PSYCH: Normally interactive. Conversant. Not depressed or anxious appearing.  Calm demeanor.   Laboratory and Imaging Data: Results for orders placed or performed in visit on 06/26/15  D-dimer, quantitative (not at [facility])  Result Value Ref Range   D-Dimer, Quant 0.27 0.00 - 0.48 ug/mL-FEU  Basic metabolic panel  Result Value Ref Range   Sodium 142 135 - 145 mEq/L   Potassium 3.8 3.5 - 5.1 mEq/L   Chloride 105 96 - 112 mEq/L   CO2 30 19 - 32 mEq/L   Glucose, Bld 118 (H) 70 - 99 mg/dL   BUN 14 6 - 23 mg/dL   Creatinine, Ser 0.99 0.40 - 1.50 mg/dL   Calcium 9.7 8.4 - 10.5 mg/dL   GFR 83.35 >60.00 mL/min  TSH  Result Value Ref Range   TSH 1.40 0.35 - 4.50 uIU/mL  CBC with Differential/Platelet  Result Value Ref Range   WBC 15.2 (H) 4.0 - 10.5 K/uL   RBC 4.34 4.22 - 5.81 Mil/uL   Hemoglobin 13.4 13.0 - 17.0 g/dL   HCT 40.4 39.0 - 52.0 %   MCV 93.2 78.0 - 100.0 fl   MCHC 33.2 30.0 - 36.0 g/dL   RDW 14.9 11.5 - 15.5 %   Platelets 224.0 150.0 - 400.0 K/uL   Neutrophils Relative % 68.9 43.0 - 77.0 %   Lymphocytes Relative 22.9 12.0 - 46.0 %   Monocytes Relative 4.3 3.0 - 12.0 %   Eosinophils Relative 3.1 0.0 - 5.0 %   Basophils Relative 0.8 0.0 - 3.0 %   Neutro Abs 10.5 (H) 1.4 - 7.7 K/uL   Lymphs Abs 3.5 0.7 - 4.0 K/uL   Monocytes Absolute 0.7 0.1 - 1.0 K/uL   Eosinophils Absolute 0.5 0.0 - 0.7 K/uL   Basophils Absolute 0.1 0.0 - 0.1 K/uL     Assessment and Plan:   Other  chest pain - Plan: Holter monitor - 48 hour  Palpitations - Plan: Holter monitor - 48 hour  >25 minutes spent in face to face time with patient, >50% spent in counselling or coordination of care: the patient has had a thorough workup, and all of  this was reviewed today in the office with him and his wife. Additionally, he is had an echocardiogram which globally was basically normal.  Gastroenterological workup has been normal.  MSK evaluation normal.  Denies anxiety.  Many physicians of been involved in this gentleman scare, and no one has found a definitive answer for why he is symptomatic.  Recommended to do a trial of some extended release nitroglycerin.  If this helps with his symptoms, then this would suggest that this is secondary to coronary disease potential is very small vessel disease.   Obtain 48-hour Holter monitor to exclude potential arrhythmia or palpitations.  He does think that he has occasional palpitations.  Follow-up: No Follow-up on file.  New Prescriptions   ISOSORBIDE MONONITRATE (IMDUR) 30 MG 24 HR TABLET    Take 1 tablet (30 mg total) by mouth daily.   Modified Medications   No medications on file   Orders Placed This Encounter  Procedures  . Holter monitor - 48 hour    Signed,  Marlean Mortell T. Jolene Guyett, MD   Patient's Medications  New Prescriptions   ISOSORBIDE MONONITRATE (IMDUR) 30 MG 24 HR TABLET    Take 1 tablet (30 mg total) by mouth daily.  Previous Medications   ACETAMINOPHEN (TYLENOL) 500 MG TABLET    Take 1,000 mg by mouth every 6 (six) hours as needed.     ASPIRIN EC 81 MG TABLET    Take 81 mg by mouth daily.   CALCIUM CARBONATE (TUMS - DOSED IN MG ELEMENTAL CALCIUM) 500 MG CHEWABLE TABLET    Chew 2 tablets by mouth daily.   HYOSCYAMINE (LEVSIN SL) 0.125 MG SL TABLET    PLACE 1 TABLET UNDER THE TONGUE AS NEEDED FOR CHEST PAIN OR SPASMS   IBUPROFEN (ADVIL,MOTRIN) 200 MG TABLET    Take 400 mg by mouth every 6 (six) hours as needed.     MULTIPLE VITAMIN  (MULTIVITAMIN) TABLET    Take 1 tablet by mouth daily.     NORTRIPTYLINE (PAMELOR) 25 MG CAPSULE    Take 1 capsule (25 mg total) by mouth at bedtime.   OMEGA-3 FATTY ACIDS (FISH OIL) 1000 MG CAPS    Take 1 capsule by mouth daily.   PROBIOTIC PRODUCT (PROBIOTIC PO)    Take 2 each by mouth every morning. Gummies  Modified Medications   No medications on file  Discontinued Medications   ESOMEPRAZOLE (NEXIUM) 20 MG CAPSULE    Take 1 capsule (20 mg total) by mouth 2 (two) times daily before a meal.   PANTOPRAZOLE (PROTONIX) 40 MG TABLET    Take 1 tablet (40 mg total) by mouth 2 (two) times daily.   RANITIDINE (ZANTAC) 150 MG CAPSULE    Take 1 capsule (150 mg total) by mouth every evening.

## 2015-12-20 ENCOUNTER — Ambulatory Visit (INDEPENDENT_AMBULATORY_CARE_PROVIDER_SITE_OTHER): Payer: 59

## 2015-12-20 DIAGNOSIS — R0789 Other chest pain: Secondary | ICD-10-CM | POA: Diagnosis not present

## 2015-12-20 DIAGNOSIS — R002 Palpitations: Secondary | ICD-10-CM

## 2015-12-25 ENCOUNTER — Ambulatory Visit: Payer: 59 | Attending: Cardiovascular Disease

## 2015-12-25 DIAGNOSIS — R0789 Other chest pain: Secondary | ICD-10-CM | POA: Diagnosis present

## 2015-12-25 DIAGNOSIS — R002 Palpitations: Secondary | ICD-10-CM | POA: Insufficient documentation

## 2016-01-15 ENCOUNTER — Encounter: Payer: Self-pay | Admitting: Cardiovascular Disease

## 2016-01-15 ENCOUNTER — Other Ambulatory Visit: Payer: Self-pay | Admitting: Cardiovascular Disease

## 2016-01-15 ENCOUNTER — Ambulatory Visit (INDEPENDENT_AMBULATORY_CARE_PROVIDER_SITE_OTHER): Payer: 59 | Admitting: Cardiovascular Disease

## 2016-01-15 ENCOUNTER — Ambulatory Visit (INDEPENDENT_AMBULATORY_CARE_PROVIDER_SITE_OTHER)
Admission: RE | Admit: 2016-01-15 | Discharge: 2016-01-15 | Disposition: A | Discharge status: Home / Self Care | Payer: Self-pay | Source: Ambulatory Visit | Attending: Cardiovascular Disease | Admitting: Cardiovascular Disease

## 2016-01-15 ENCOUNTER — Encounter: Payer: Self-pay | Admitting: Family Medicine

## 2016-01-15 ENCOUNTER — Ambulatory Visit (INDEPENDENT_AMBULATORY_CARE_PROVIDER_SITE_OTHER): Payer: 59 | Admitting: Family Medicine

## 2016-01-15 VITALS — BP 118/79 | HR 92 | Temp 98.3°F | Ht 65.0 in | Wt 150.8 lb

## 2016-01-15 VITALS — BP 110/76 | HR 83 | Ht 65.0 in | Wt 150.8 lb

## 2016-01-15 DIAGNOSIS — Z720 Tobacco use: Secondary | ICD-10-CM

## 2016-01-15 DIAGNOSIS — E785 Hyperlipidemia, unspecified: Secondary | ICD-10-CM | POA: Diagnosis not present

## 2016-01-15 DIAGNOSIS — R079 Chest pain, unspecified: Secondary | ICD-10-CM

## 2016-01-15 DIAGNOSIS — I999 Unspecified disorder of circulatory system: Secondary | ICD-10-CM | POA: Diagnosis not present

## 2016-01-15 DIAGNOSIS — K219 Gastro-esophageal reflux disease without esophagitis: Secondary | ICD-10-CM

## 2016-01-15 DIAGNOSIS — I471 Supraventricular tachycardia: Secondary | ICD-10-CM

## 2016-01-15 DIAGNOSIS — I739 Peripheral vascular disease, unspecified: Secondary | ICD-10-CM

## 2016-01-15 DIAGNOSIS — R000 Tachycardia, unspecified: Secondary | ICD-10-CM | POA: Diagnosis not present

## 2016-01-15 DIAGNOSIS — R0789 Other chest pain: Secondary | ICD-10-CM

## 2016-01-15 MED ORDER — NITROGLYCERIN 0.4 MG SL SUBL: 0.4000 mg | SUBLINGUAL_TABLET | SUBLINGUAL | Status: DC | PRN

## 2016-01-15 MED ORDER — ISOSORBIDE DINITRATE 20 MG PO TABS: 20.0000 mg | ORAL_TABLET | Freq: Three times a day (TID) | ORAL | Status: DC | PRN

## 2016-01-15 MED ORDER — ISOSORBIDE MONONITRATE ER 30 MG PO TB24: 30.0000 mg | ORAL_TABLET | Freq: Two times a day (BID) | ORAL | Status: DC | PRN

## 2016-01-15 MED ORDER — ISOSORBIDE MONONITRATE ER 30 MG PO TB24: 30.0000 mg | ORAL_TABLET | Freq: Every day | ORAL | Status: DC

## 2016-01-15 NOTE — Patient Instructions (Signed)

## 2016-01-15 NOTE — Patient Instructions (Addendum)
We will schedule a CT coronary calcium score for chest pain Today @ 3:30, please arrive @ 3:15 [address], [place] There is a one-time fee of $150 due at the time of your procedure  Please try to increase the isosorbide up to 1 in Am and in PM Up to 1 pill twice a day after one week if tolerated.  Take NTG (nitrio under the tongue as needed for chest pain  For bad few hours, stutter in pain, Take the isosorbide Dinitrate  Other options include Ranexa, twice a day pill  Please call us if you have new issues that need to be addressed before your next appt.  Your physician wants you to follow-up in: 1 month  Date & time:______________________________  Coronary Calcium Scan A coronary calcium scan is an imaging test used to look for deposits of calcium and other fatty materials (plaques) in the inner lining of the blood vessels of your heart (coronary arteries). These deposits of calcium and plaques can partly clog and narrow the coronary arteries without producing any symptoms or warning signs. This puts you at risk for a heart attack. This test can detect these deposits before symptoms develop.  LET [facility] CARE PROVIDER KNOW ABOUT:  Any allergies you have.  All medicines you are taking, including vitamins, herbs, eye drops, creams, and over-the-counter medicines.  Previous problems you or members of your family have had with the use of anesthetics.  Any blood disorders you have.  Previous surgeries you have had.  Medical conditions you have.  Possibility of pregnancy, if this applies. RISKS AND COMPLICATIONS Generally, this is a safe procedure. However, as with any procedure, complications can occur. This test involves the use of radiation. Radiation exposure can be dangerous to a pregnant woman and her unborn baby. If you are pregnant, you should not have this procedure done.  BEFORE THE PROCEDURE There is no special preparation for the  procedure. PROCEDURE  You will need to undress and put on a hospital gown. You will need to remove any jewelry around your neck or chest.  Sticky electrodes are placed on your chest and are connected to an electrocardiogram (EKG or electrocardiography) machine to recorda tracing of the electrical activity of your heart.  A CT scanner will take pictures of your heart. During this time, you will be asked to lie still and hold your breath for 2-3 seconds while a picture is being taken of your heart. AFTER THE PROCEDURE   You will be allowed to get dressed.  You can return to your normal activities after the scan is done.   This information is not intended to replace advice given to you by your health care provider. Make sure you discuss any questions you have with your health care provider.   Document Released: 02/13/2008 Document Revised: 08/22/2013 Document Reviewed: 04/24/2013 Elsevier Interactive Patient Education [employer].

## 2016-01-15 NOTE — Assessment & Plan Note (Signed)
Worsening chest pain symptoms, some relief on isosorbide We have recommended CT coronary calcium scoring to rule out underlying coronary disease. We did offer cardiac catheterization or stress testing. Long discussion on each modality. We did do catheterization in 2014 which showed no significant disease.  Recommended he try to increase isosorbide mononitrate up to 30 mg twice a day, slowly Also could take isosorbide dinitrate when necessary for periods of stuttering pain Provided him with nitroglycerin sublingual We did discuss Ranexa. No samples available at this time. We will call the company for samples

## 2016-01-15 NOTE — Assessment & Plan Note (Signed)
We have encouraged him to continue to work on weaning his cigarettes and smoking cessation. He will continue to work on this and does not want any assistance with chantix.  Wife reports very angry on Chantix

## 2016-01-15 NOTE — Assessment & Plan Note (Signed)
Does report periods of GERD. Does not seem to be related to waxing waning chest pain symptoms   Total encounter time more than 25 minutes  Greater than 50% was spent in counseling and coordination of care with the patient

## 2016-01-15 NOTE — Progress Notes (Signed)
Patient ID: Isaiah Rangel, male    DOB: 09/11/1960, 55 y.o.   MRN: [id number]  HPI Comments: 55 year old gentleman who is very active at baseline who works as a Company secretary, history of smoking Who continues to smoke previously 2 packs per day, now 1 pack per day, GERD, remote neck injury with fusion surgery in 2002 with chronic neck pain and tightness, chronic chest pain symptoms, previous cardiac catheterization August 2014 showing minimal coronary artery disease, presenting for follow-up of chest pain symptoms  On his last clinic visit he continue to have severe chest pain symptoms at rest, with exertion, in bed, getting out of bed Symptoms did improve on isosorbide He reports GI workup was essentially negative, had EGD  Ran out of isosorbide, symptoms became worse recently, last Friday He almost went to the emergency room. Now back on isosorbide mononitrate, symptoms have improved it still having some symptoms Chest pain last 5 minutes typically, last Friday was stuttering on and off all day  Again reports he can have a good day with no symptoms, very active at work, moves [employer], very heavy items weighing several 100 pounds. Other days can be doing nothing with symptoms  Reports he has levsin, takes this occasionally, unclear if it helps  EKG on today's visit shows normal sinus rhythm with rate 83 bpm, no significant ST or T-wave changes  Other past medical history a stress echocardiogram  was performed that showed no significant ischemia. He achieved a heart rate greater than 150. Cardiac enzymes were negative during his hospital stay.  He did have knee surgery since his last visit. He continues to smoke one pack per day. He has tried Chantix in the past though had severe vertigo. He does also have Raynaud's in his hands      No Known Allergies  Current Outpatient Prescriptions on File Prior to Visit  Medication Sig Dispense Refill  . acetaminophen (TYLENOL) 500  MG tablet Take 1,000 mg by mouth every 6 (six) hours as needed.      Marland Kitchen aspirin EC 81 MG tablet Take 81 mg by mouth daily.    . calcium carbonate (TUMS - DOSED IN MG ELEMENTAL CALCIUM) 500 MG chewable tablet Chew 2 tablets by mouth as needed.     . hyoscyamine (LEVSIN SL) 0.125 MG SL tablet PLACE 1 TABLET UNDER THE TONGUE AS NEEDED FOR CHEST PAIN OR SPASMS 90 tablet 0  . ibuprofen (ADVIL,MOTRIN) 200 MG tablet Take 400 mg by mouth every 6 (six) hours as needed.      . Multiple Vitamin (MULTIVITAMIN) tablet Take 1 tablet by mouth daily.      . nortriptyline (PAMELOR) 25 MG capsule Take 1 capsule (25 mg total) by mouth at bedtime. (Patient taking differently: Take 25 mg by mouth daily. ) 30 capsule 5  . Omega-3 Fatty Acids (FISH OIL) 1000 MG CAPS Take 1 capsule by mouth daily.     No current facility-administered medications on file prior to visit.    Past Medical History  Diagnosis Date  . OA (osteoarthritis)   . Current smoker   . GERD (gastroesophageal reflux disease)   . Anxiety   . BPH (benign prostatic hyperplasia)     Past Surgical History  Procedure Laterality Date  . Back surgery    . Neck fusion    . Nasal sinus surgery    . Knee arthroscopy w/ partial medial meniscectomy Right 2012    [place], 2012  . Vasectomy    .  Cardiac catheterization  04/06/13    [facility]- minor luminal irregularities, otherwise normal cors; EF > 55%. Medical management and smoking cessation recommended    Social History  reports that he has been smoking Cigarettes.  He has a 30 pack-year smoking history. He has never used smokeless tobacco. He reports that he does not drink alcohol or use illicit drugs.  Family History family history includes Arthritis in his father; CVA (age of onset: 46) in his father; Colon cancer in his mother; Coronary artery disease in his father; Coronary artery disease (age of onset: 35) in his brother; Heart attack in his brother and brother; Hypertension in his father. There is  no history of Esophageal cancer or Stomach cancer.   Review of Systems  Constitutional: Negative.   Respiratory: Negative.   Cardiovascular: Positive for chest pain.  Musculoskeletal: Negative.   Neurological: Negative.   Psychiatric/Behavioral: Negative.   All other systems reviewed and are negative.   BP 110/76 mmHg  Pulse 83  Ht 5\' 5"  (1.651 m)  Wt 150 lb 12 oz (68.38 kg)  BMI 25.09 kg/m2  Physical Exam  Constitutional: He is oriented to person, place, and time. He appears well-developed and well-nourished.  HENT:  Head: Normocephalic.  Nose: Nose normal.  Mouth/Throat: Oropharynx is clear and moist.  Eyes: Conjunctivae are normal. Pupils are equal, round, and reactive to light.  Neck: Normal range of motion. Neck supple. No JVD present.  Cardiovascular: Normal rate, regular rhythm, S1 normal, S2 normal, normal heart sounds and intact distal pulses.  Exam reveals no gallop and no friction rub.   No murmur heard. Pulmonary/Chest: Effort normal. No respiratory distress. He has decreased breath sounds. He has no wheezes. He has no rales. He exhibits no tenderness.  Abdominal: Soft. Bowel sounds are normal. He exhibits no distension. There is no tenderness.  Musculoskeletal: Normal range of motion. He exhibits no edema or tenderness.  Lymphadenopathy:    He has no cervical adenopathy.  Neurological: He is alert and oriented to person, place, and time. Coordination normal.  Skin: Skin is warm and dry. No rash noted. No erythema.  Psychiatric: He has a normal mood and affect. His behavior is normal. Judgment and thought content normal.      Assessment and Plan   Nursing note and vitals reviewed.

## 2016-01-15 NOTE — Progress Notes (Signed)
Pre visit review using our clinic review tool, if applicable. No additional management support is needed unless otherwise documented below in the visit note. 

## 2016-01-15 NOTE — Progress Notes (Signed)
Dr. [name] T. Koralynn Greenspan, MD, [facility] [place], [address] Phone: [phone] Fax: [phone]  01/15/2016  Patient: Isaiah Rangel, MRN: [id number], DOB: 1960/12/06, 55 y.o.  Primary Physician:  Owens Loffler, MD   Chief Complaint  Patient presents with  . Follow-up    Holter Monitor Results   Subjective:   Isaiah Rangel is a 55 y.o. very pleasant male patient who presents with the following:  Isaiah Rangel is here in follow-up, recently seen on 12/02/2015 and at that point he has continued to have worsening substernal chest pain over the last 2 years. He has had an exhaustive work-up including stress testing, cath in 2014, GI workup with normal EGD, neurological work-up.  On last office visit, I decided to do a trial of Imdur to see if this in any way could be small vessel coronary disease, and frankly this was one of the only other things that I could think to try.  Chest pain has been much better with extended release NTG, and got much worse when he ran out of pills for 3-4 days.  Holter monitor shows frequent sinus tach.  Chest pain had come back when off of NTG.   Restarted Pamelor, too.     Past Medical History, Surgical History, Social History, Family History, Problem List, Medications, and Allergies have been reviewed and updated if relevant.  Patient Active Problem List   Diagnosis Date Noted  . Encounter for preventive measure 08/09/2015  . Chest pain 06/27/2015  . New daily persistent headache 04/29/2015  . Cerebrovascular disease 04/29/2015  . Dizziness and giddiness 04/29/2015  . Tobacco abuse 04/29/2015  . Radicular pain of right lower back 12/05/2013  . S/P cardiac catheterization 04/19/2013  . Benign paroxysmal positional vertigo 02/04/2013  . Smoking 09/09/2011  . Hyperlipidemia 04/09/2010  . GERD 09/21/2008  . ERECTILE DYSFUNCTION 09/20/2008  . FATIGUE 09/20/2008    Past Medical History   Diagnosis Date  . OA (osteoarthritis)   . Current smoker   . GERD (gastroesophageal reflux disease)   . Anxiety   . BPH (benign prostatic hyperplasia)     Past Surgical History  Procedure Laterality Date  . Back surgery    . Neck fusion    . Nasal sinus surgery    . Knee arthroscopy w/ partial medial meniscectomy Right 2012    [place], 2012  . Vasectomy    . Cardiac catheterization  04/06/13    [facility]- minor luminal irregularities, otherwise normal cors; EF > 55%. Medical management and smoking cessation recommended    Social History   Social History  . Marital Status: Married    Spouse Name: N/A  . Number of Children: N/A  . Years of Education: N/A   Occupational History  . [employer]    Social History Main Topics  . Smoking status: Current Every Day Smoker -- 1.00 packs/day for 30 years    Types: Cigarettes  . Smokeless tobacco: Never Used     Comment: he is aware he needs to quit   . Alcohol Use: No  . Drug Use: No  . Sexual Activity:    Partners: Female   Other Topics Concern  . 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 Lister Dennin([employer])      Likes to work out       Lives with GF    Family History  Problem Relation Age of Onset  .  Arthritis Father   . Coronary artery disease Father   . Hypertension Father   . Coronary artery disease Brother 18  . Colon cancer Mother   . Esophageal cancer Neg Hx   . Stomach cancer Neg Hx   . Heart attack Brother   . Heart attack Brother   . CVA Father 31    No Known Allergies  Medication list reviewed and updated in full in [place].  GEN: No acute illnesses, no fevers, chills. GI: No n/v/d, eating normally Pulm: No SOB Interactive and getting along well at home.  Otherwise, ROS is as per the HPI.  Objective:   BP 118/79 mmHg  Pulse 92  Temp(Src) 98.3 F (36.8 C) (Oral)  Ht 5\' 5"  (1.651 m)  Wt 150 lb 12 oz (68.38 kg)  BMI 25.09 kg/m2  GEN: WDWN, NAD, Non-toxic, A & O x  3 HEENT: Atraumatic, Normocephalic. Neck supple. No masses, No LAD. Ears and Nose: No external deformity. CV: RRR, No M/G/R. No JVD. No thrill. No extra heart sounds. PULM: CTA B, no wheezes, crackles, rhonchi. No retractions. No resp. distress. No accessory muscle use. EXTR: No c/c/e NEURO Normal gait.  PSYCH: Normally interactive. Conversant. Not depressed or anxious appearing.  Calm demeanor.   Laboratory and Imaging Data:  Assessment and Plan:   Other chest pain - Plan: Ambulatory referral to Cardiology  Small vessel disease ([place]) - Plan: Ambulatory referral to Cardiology  Sinus tachycardia ([place]) - Plan: Ambulatory referral to Cardiology  Persistent chest pain, negative GI work-up with sinus tachycardia, and chest pain that is significantly relieved with oral Imdur. To me this suggests possible symptomatic small vessel coronary disease or possible anginal equivalent.   Smoker - counselled regarding this.   I think that he would be better served discussing this with his cardiologist, and fortunately he will be seen today.  I am unsure if his sinus tachycardia is playing a role here.   Modified Medications   Modified Medication Previous Medication   ISOSORBIDE MONONITRATE (IMDUR) 30 MG 24 HR TABLET isosorbide mononitrate (IMDUR) 30 MG 24 hr tablet      Take 1 tablet (30 mg total) by mouth daily.    Take 1 tablet (30 mg total) by mouth daily.   Orders Placed This Encounter  Procedures  . Ambulatory referral to Cardiology    Signed,  [name] T. Arleigh Dicola, MD   Patient's Medications  New Prescriptions   No medications on file  Previous Medications   ACETAMINOPHEN (TYLENOL) 500 MG TABLET    Take 1,000 mg by mouth every 6 (six) hours as needed.     ASPIRIN EC 81 MG TABLET    Take 81 mg by mouth daily.   CALCIUM CARBONATE (TUMS - DOSED IN MG ELEMENTAL CALCIUM) 500 MG CHEWABLE TABLET    Chew 2 tablets by mouth daily.   HYOSCYAMINE (LEVSIN SL) 0.125 MG SL TABLET    PLACE 1  TABLET UNDER THE TONGUE AS NEEDED FOR CHEST PAIN OR SPASMS   IBUPROFEN (ADVIL,MOTRIN) 200 MG TABLET    Take 400 mg by mouth every 6 (six) hours as needed.     MULTIPLE VITAMIN (MULTIVITAMIN) TABLET    Take 1 tablet by mouth daily.     NORTRIPTYLINE (PAMELOR) 25 MG CAPSULE    Take 1 capsule (25 mg total) by mouth at bedtime.   OMEGA-3 FATTY ACIDS (FISH OIL) 1000 MG CAPS    Take 1 capsule by mouth daily.   PROBIOTIC PRODUCT (PROBIOTIC  PO)    Take 2 each by mouth every morning. Gummies  Modified Medications   Modified Medication Previous Medication   ISOSORBIDE MONONITRATE (IMDUR) 30 MG 24 HR TABLET isosorbide mononitrate (IMDUR) 30 MG 24 hr tablet      Take 1 tablet (30 mg total) by mouth daily.    Take 1 tablet (30 mg total) by mouth daily.  Discontinued Medications   No medications on file

## 2016-01-15 NOTE — Assessment & Plan Note (Signed)
Cholesterol previously well-controlled on no medication

## 2016-01-31 ENCOUNTER — Ambulatory Visit: Payer: 59 | Admitting: Neurology

## 2016-02-11 ENCOUNTER — Other Ambulatory Visit: Payer: Self-pay | Admitting: *Deleted

## 2016-02-11 MED ORDER — ISOSORBIDE DINITRATE 20 MG PO TABS: 20.0000 mg | ORAL_TABLET | Freq: Three times a day (TID) | ORAL | Status: DC | PRN

## 2016-02-21 ENCOUNTER — Ambulatory Visit (INDEPENDENT_AMBULATORY_CARE_PROVIDER_SITE_OTHER): Payer: 59 | Admitting: Cardiovascular Disease

## 2016-02-21 ENCOUNTER — Encounter: Payer: Self-pay | Admitting: Cardiovascular Disease

## 2016-02-21 ENCOUNTER — Telehealth: Payer: Self-pay

## 2016-02-21 VITALS — BP 110/64 | HR 98 | Ht 65.0 in | Wt 150.5 lb

## 2016-02-21 DIAGNOSIS — I499 Cardiac arrhythmia, unspecified: Secondary | ICD-10-CM

## 2016-02-21 DIAGNOSIS — I2510 Atherosclerotic heart disease of native coronary artery without angina pectoris: Secondary | ICD-10-CM

## 2016-02-21 DIAGNOSIS — E785 Hyperlipidemia, unspecified: Secondary | ICD-10-CM

## 2016-02-21 DIAGNOSIS — R079 Chest pain, unspecified: Secondary | ICD-10-CM | POA: Diagnosis not present

## 2016-02-21 DIAGNOSIS — Z720 Tobacco use: Secondary | ICD-10-CM | POA: Diagnosis not present

## 2016-02-21 MED ORDER — VARENICLINE TARTRATE 1 MG PO TABS: 1.0000 mg | ORAL_TABLET | Freq: Two times a day (BID) | ORAL | Status: DC

## 2016-02-21 MED ORDER — ISOSORBIDE MONONITRATE ER 30 MG PO TB24: 30.0000 mg | ORAL_TABLET | Freq: Every day | ORAL | Status: DC

## 2016-02-21 NOTE — Telephone Encounter (Signed)
Ok and noted

## 2016-02-21 NOTE — Progress Notes (Signed)
Patient ID: Isaiah Rangel, male   DOB: 05-01-1961, 55 y.o.   MRN: [id number] Cardiology Office Note  Date:  02/21/2016   ID:  REYMOND BARRUS, DOB [birth year], MRN [id number]  PCP:  Owens Loffler, MD   Chief Complaint  Patient presents with  . other    1 month f/u pt does not know if should be taking isosorbide mono or isosorbide dinitrate? Pt was dispensed isosorbide dinitrate #90 day supply from mail order but he is not taking that one. Meds reviewed verbally with pt.    HPI:  55 year old gentleman who is very active at baseline who works as a Company secretary, history of smoking Who continues to smoke previously 2 packs per day, now 1 pack per day, GERD, remote neck injury with fusion surgery in 2002 with chronic neck pain and tightness, chronic chest pain symptoms, previous cardiac catheterization August 2014 showing minimal coronary artery disease, presenting for follow-up of chest pain symptoms  In follow-up today, reports that he is doing better, chest pain well-controlled on isosorbide mononitrate 30 mg daily. He did receive a prescription for the wrong isosorbide, received isosorbide dinitrate He's not had to take nitroglycerin sublingual recently  Recent CT coronary calcium score reviewed with him, score 135 Most of the disease is in the distal left main, proximal LAD ostial region Images reviewed with him in detail  He is interested in smoking cessation techniques, particularly Chantix. Requesting a prescription Continues to work long hours , Company secretary  EKG on today's visit shows normal sinus rhythm with rate 98 bpm, no significant ST or T-wave changes  Other past medical history Previous GI workup was essentially negative, had EGD In the past, Ran out of isosorbide, symptoms became worse , almost went to the emergency room.   reports he can have a good day with no symptoms, very active at work, moves [employer], very heavy items weighing several 100 pounds. Other  days can be doing nothing with symptoms  Reports he has levsin, takes this occasionally, unclear if it helps  a stress echocardiogram was performed that showed no significant ischemia. He achieved a heart rate greater than 150. Cardiac enzymes were negative during his hospital stay.  He did have knee surgery since his last visit. He continues to smoke one pack per day. He has tried Chantix in the past though had severe vertigo. He does also have Raynaud's in his hands  PMH:   has a past medical history of OA (osteoarthritis); Current smoker; GERD (gastroesophageal reflux disease); Anxiety; and BPH (benign prostatic hyperplasia).  PSH:    Past Surgical History  Procedure Laterality Date  . Back surgery    . Neck fusion    . Nasal sinus surgery    . Knee arthroscopy w/ partial medial meniscectomy Right 2012    [place], 2012  . Vasectomy    . Cardiac catheterization  04/06/13    [facility]- minor luminal irregularities, otherwise normal cors; EF > 55%. Medical management and smoking cessation recommended    Current Outpatient Prescriptions  Medication Sig Dispense Refill  . acetaminophen (TYLENOL) 500 MG tablet Take 1,000 mg by mouth every 6 (six) hours as needed.      Marland Kitchen aspirin EC 81 MG tablet Take 81 mg by mouth daily.    . calcium carbonate (TUMS - DOSED IN MG ELEMENTAL CALCIUM) 500 MG chewable tablet Chew 2 tablets by mouth as needed.     . hyoscyamine (LEVSIN SL) 0.125 MG SL tablet PLACE 1  TABLET UNDER THE TONGUE AS NEEDED FOR CHEST PAIN OR SPASMS 90 tablet 0  . ibuprofen (ADVIL,MOTRIN) 200 MG tablet Take 400 mg by mouth every 6 (six) hours as needed.      . isosorbide mononitrate (IMDUR) 30 MG 24 hr tablet Take 1 tablet (30 mg total) by mouth daily. 90 tablet 4  . Multiple Vitamin (MULTIVITAMIN) tablet Take 1 tablet by mouth daily.      . nitroGLYCERIN (NITROSTAT) 0.4 MG SL tablet DISSOLVE 1 TABLET UNDER THE TONGUE EVERY 5 MINUTES AS NEEDED FOR CHEST PAIN 75 tablet 3  . nortriptyline  (PAMELOR) 25 MG capsule Take 1 capsule (25 mg total) by mouth at bedtime. (Patient taking differently: Take 25 mg by mouth daily. ) 30 capsule 5  . Omega-3 Fatty Acids (FISH OIL) 1000 MG CAPS Take 1 capsule by mouth daily.    . varenicline (CHANTIX CONTINUING MONTH PAK) 1 MG tablet Take 1 tablet (1 mg total) by mouth 2 (two) times daily. 60 tablet 6   No current facility-administered medications for this visit.     Allergies:   Review of patient's allergies indicates no known allergies.   Social History:  The patient  reports that he has been smoking Cigarettes.  He has a 30 pack-year smoking history. He has never used smokeless tobacco. He reports that he does not drink alcohol or use illicit drugs.   Family History:   family history includes Arthritis in his father; CVA (age of onset: 23) in his father; Colon cancer in his mother; Coronary artery disease in his father; Coronary artery disease (age of onset: 47) in his brother; Heart attack in his brother and brother; Hypertension in his father. There is no history of Esophageal cancer or Stomach cancer.    Review of Systems: Review of Systems  Constitutional: Negative.   Respiratory: Negative.   Cardiovascular: Positive for chest pain.  Gastrointestinal: Negative.   Musculoskeletal: Negative.   Neurological: Negative.   Psychiatric/Behavioral: Negative.   All other systems reviewed and are negative.    PHYSICAL EXAM: VS:  BP 110/64 mmHg  Pulse 98  Ht 5\' 5"  (1.651 m)  Wt 150 lb 8 oz (68.266 kg)  BMI 25.04 kg/m2 , BMI Body mass index is 25.04 kg/(m^2). GEN: Well nourished, well developed, in no acute distress HEENT: normal Neck: no JVD, carotid bruits, or masses Cardiac: RRR; no murmurs, rubs, or gallops,no edema  Respiratory:  clear to auscultation bilaterally, normal work of breathing GI: soft, nontender, nondistended, + BS MS: no deformity or atrophy Skin: warm and dry, no rash Neuro:  Strength and sensation are  intact Psych: euthymic mood, full affect    Recent Labs: 06/26/2015: BUN 14; Creatinine, Ser 0.99; Hemoglobin 13.4; Platelets 224.0; Potassium 3.8; Sodium 142; TSH 1.40    Lipid Panel Lab Results  Component Value Date   CHOL 158 03/29/2013   HDL 31* 03/29/2013   LDLCALC 88 03/29/2013   TRIG 193 03/29/2013      Wt Readings from Last 3 Encounters:  02/21/16 150 lb 8 oz (68.266 kg)  01/15/16 150 lb 12 oz (68.38 kg)  01/15/16 150 lb 12 oz (68.38 kg)       ASSESSMENT AND PLAN:  Chest pain, unspecified chest pain type - Plan: EKG 12-Lead Chest pain improved on isosorbide mononitrate, likely secondary to spasm Otherwise active at baseline with no symptoms  Tobacco abuse Long discussion, will provide prescription for Chantix and discussed techniques on how to take this  Hyperlipidemia Cholesterol is  mildly elevated, we did discuss goal LDL less than 70 given coronary disease She does not want a statin at this time  Coronary artery disease involving native coronary artery of native heart without angina pectoris CT coronary calcium score discussed with him, He does not want a statin at this time Recommended smoke cessation  Disposition:   F/U  12 months   Orders Placed This Encounter  Procedures  . EKG 12-Lead    Total encounter time more than 25 minutes  Greater than 50% was spent in counseling and coordination of care with the patient   Signed, Esmond Plants, M.D., Ph.D. 02/21/2016  [place], [place]

## 2016-02-21 NOTE — Telephone Encounter (Signed)
Mitzi pts wife left /vm that pt received isosorbide dinitrate 20 mg from [facility] that was approved on 02/11/16. Mitzi said pt works out in the heat and he cannot take that med. Pt saw Dr Rockey Situ this afternoon and Dr Rockey Situ prescribed isosorbide moninitrate 30 mg which is what Mitzi wanted pt to have. Pt is not out of med. I advised Mitzi that [facility] faxed a request for the isosorbide dinitrate 20 mg on 02/11/16. Mitzi said she knew that but pt cannot tolerate that in the heat. Mitzi said everything is OK now and just wanted [facility] to know what pt would be taking. FYI to Dr Lorelei Pont.

## 2016-02-21 NOTE — Patient Instructions (Signed)
Medication Instructions:   Please start chantix 1/2 pill slowly up to twice a day Then slowly up to full pill twice a day  Take (imdur) isosorbide mononitrate one a day for chest pain, 4$  Labwork:  Lipids on your next clinic visit, fasting  Check out "GOOD [email or web address]   Follow-Up: It was a pleasure seeing you in the office today. Please call us if you have new issues that need to be addressed before your next appt.  [phone]  Your physician wants you to follow-up in: 6 months.  You will receive a reminder letter in the mail two months in advance. If you don't receive a letter, please call our office to schedule the follow-up appointment.  If you need a refill on your cardiac medications before your next appointment, please call your pharmacy.

## 2016-03-04 ENCOUNTER — Other Ambulatory Visit: Payer: Self-pay | Admitting: Family Medicine

## 2016-04-01 IMAGING — CT CT ANGIO HEAD
1 of 12 series · 1 of 33 positions shown · IV contrast (Iohexol (Omnipaque 350))
Comparison: Brain MRI 04/19/2015

CLINICAL DATA: New daily headaches.  Dizziness.

EXAM:
CT ANGIOGRAPHY HEAD
TECHNIQUE: Multidetector CT imaging of the head was performed using the
standard protocol during bolus administration of intravenous
contrast. Multiplanar CT image reconstructions and MIPs were
obtained to evaluate the vascular anatomy.
CONTRAST:  50mL OMNIPAQUE IOHEXOL 350 MG/ML SOLN

[Series 300: locator · axial · 0.49mm/px · 1 of 1 slices shown]
[im 1/1  soft-tissue]
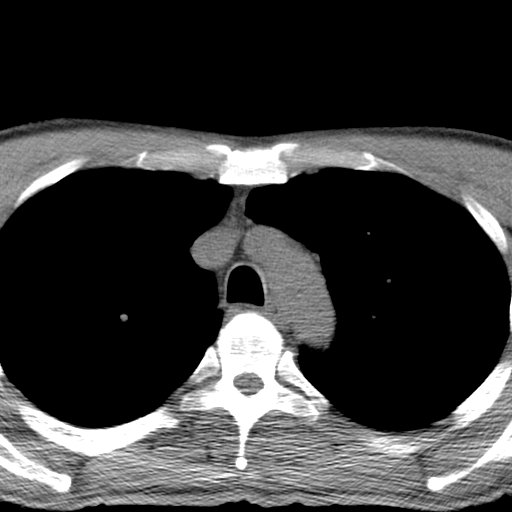

[1 of 33 positions shown; findings below may reference images not displayed]

FINDINGS: CT HEAD

Skull and Sinuses:Negative for fracture or destructive process. The
mastoids, middle ears, and imaged paranasal sinuses are clear.
Mucous retention versus Tornwaldt cyst.

Orbits: Negative.

Brain: No acute or remote infarction, hemorrhage, hydrocephalus, or
mass lesion/mass effect. Mild white matter disease seen on the
previous brain MRI is not visible by CT.

CTA HEAD

Anterior circulation: Symmetric carotids. No significant posterior
communicating arteries. Anterior communicating artery is broad. Tiny
outpouching from the left supraclinoid carotid artery has subtle
emanating vessel on coronal thin section reformats. No suspected
aneurysm, vascular malformation, or stenosis.

Posterior circulation: Strong left dominance. The tiny right
vertebral artery does not opacified beyond the dural penetration,
with distal right V4 segment reconstitution which is presumably
retrograde, opacifying the visible small right PICA. Left pica and
bilateral superior cerebellar arteries are unremarkable. No
aneurysm. No evidence of vascular malformation.

Venous sinuses: Patent, with transverse sigmoid system dominant on
the right.

Delayed phase:No abnormal parenchymal enhancement.
IMPRESSION: 1. Hypoplastic right vertebral artery occlusion at the dura with
distal V4 segment reconstitution and patent PICA.
2. Negative anterior circulation.

## 2016-04-14 ENCOUNTER — Telehealth: Payer: Self-pay | Admitting: Cardiovascular Disease

## 2016-04-14 NOTE — Telephone Encounter (Signed)
Spoke w/ pt and his wife.   They report that pt's chest pain has increased in the past week, his is taking nitro several times a day. Denies SOB, dizziness, n/v or sweating.  Pt would like to know if this is "something he's going to have to live with".  Pt was advised on 01/15/16: "Please try to increase the isosorbide up to 1 in Am and in PM Up to 1 pill twice a day after one week if tolerated. Take NTG (nitrio under the tongue as needed for chest pain For bad few hours, stutter in pain, Take the isosorbide Dinitrate Other options include Ranexa, twice a day pill"  At 1 month f/u on 02/21/16: "Take (imdur) isosorbide mononitrate one a day for chest pain, 4$"  Inquired if pt is still smoking, his wife reports yes and that he is done w/ the conversation and walked away from the phone. She is trying to get pt to stop smoking, but he will not start Chantix or pick a stop date; she voices frustration that pt is not helping her to help him.  She was not present for pt's appt on 5/17 and was not aware of the instructions.   She would like to know which and how much isosorbide to have pt take, as he has only been taking mononitrate once daily; still has dinitrate at home but has not been taking.  She asks that call be returned to her cell phone at [phone], as pt will not relay instructions to her and she prepares his meds. Please advise.  Thank you.

## 2016-04-14 NOTE — Telephone Encounter (Signed)
Pt wife called, states pt has had to take 1-2 Nitro for "break through" CP. States he has been taking these daily for the last couple weeks. Please call. States she will be without signal for about an hour. Request a call after 1:30

## 2016-04-15 NOTE — Telephone Encounter (Signed)
Would continue imdur daily Take isosorbide dinitrate as needed for bad days (lasts 4 to 6 hours) NTG for emergewncy Could try ranexa Will likely need to live with sx Repeat cath can be done, but will.l likely should minimal CAD as seen on CT scan

## 2016-04-16 NOTE — Telephone Encounter (Signed)
Left message on Isaiah Rangel's vm w/ Dr. Donivan Scull recommendation.  Asked her to call back w/ any questions or if pt would like to schedule an appt to come in to talk to Dr. Rockey Situ.

## 2016-05-18 ENCOUNTER — Ambulatory Visit (INDEPENDENT_AMBULATORY_CARE_PROVIDER_SITE_OTHER): Payer: 59 | Admitting: Family Medicine

## 2016-05-18 ENCOUNTER — Encounter: Payer: Self-pay | Admitting: Family Medicine

## 2016-05-18 DIAGNOSIS — M5431 Sciatica, right side: Secondary | ICD-10-CM | POA: Diagnosis not present

## 2016-05-18 MED ORDER — PREDNISONE 20 MG PO TABS: ORAL_TABLET | ORAL | 0 refills | Status: DC

## 2016-05-18 NOTE — Progress Notes (Signed)
Pre visit review using our clinic review tool, if applicable. No additional management support is needed unless otherwise documented below in the visit note. 

## 2016-05-18 NOTE — Assessment & Plan Note (Signed)
D/w pt.  Likely dx.  Not needing imaging at this point.  Stop nsaids.  Change to pred taper with routine cautions.  See AVS.  He agrees.  Update [place] as needed.  Routed to PCP as FYI.

## 2016-05-18 NOTE — Patient Instructions (Signed)
Stop ibuprofen.  Don't take aleve or ibuprofen with prednisone.  Prednisone taper with food.  Update [place] as needed.  Take care.  Glad to see you.

## 2016-05-18 NOTE — Progress Notes (Signed)
Back pain. H/o back and neck surgery years ago.  Pain with turning, with certain movements.  R leg, sciatica, radicular sx, down to the R knee.  Worse recently.  No B/B sx.  This flare started last week.  Worse if prolonged sitting.  No L leg sx.  He has used TENS unit in the past but note recently, not with this flare.  No trauma with this flare, but he has been doing a lot of lifting in general at work.  Pain with back extension.    Smoking less than prev.  He hasn't gotten chantix filled yet but is working on that.  Cut back to <1 PPD in the meantime.    He is taking imdur with prn ntg o/w.  Prn ntg helps as needed.    Meds, vitals, and allergies reviewed.   ROS: Per HPI unless specifically indicated in ROS section   nad ncat rrr Back w/o midline pain, no cva pain R lower back ttp in the paraspinal muscles L lower back not ttp R greater troch not ttp S/S wnl for the BLE except for some tingling and altered but not absent sensation in the R thigh.   R SLR positive.   Able to bear weight on either leg, able to do toe and heel raises.

## 2016-06-09 ENCOUNTER — Encounter: Payer: Self-pay | Admitting: Gastroenterology

## 2016-06-15 ENCOUNTER — Other Ambulatory Visit: Payer: Self-pay | Admitting: Family Medicine

## 2016-06-15 DIAGNOSIS — Z125 Encounter for screening for malignant neoplasm of prostate: Secondary | ICD-10-CM

## 2016-06-15 DIAGNOSIS — Z79899 Other long term (current) drug therapy: Secondary | ICD-10-CM

## 2016-06-15 DIAGNOSIS — E7849 Other hyperlipidemia: Secondary | ICD-10-CM

## 2016-06-15 DIAGNOSIS — Z1159 Encounter for screening for other viral diseases: Secondary | ICD-10-CM

## 2016-06-15 DIAGNOSIS — Z114 Encounter for screening for human immunodeficiency virus [HIV]: Secondary | ICD-10-CM

## 2016-06-17 ENCOUNTER — Other Ambulatory Visit (INDEPENDENT_AMBULATORY_CARE_PROVIDER_SITE_OTHER): Payer: 59

## 2016-06-17 DIAGNOSIS — E784 Other hyperlipidemia: Secondary | ICD-10-CM | POA: Diagnosis not present

## 2016-06-17 DIAGNOSIS — Z1159 Encounter for screening for other viral diseases: Secondary | ICD-10-CM

## 2016-06-17 DIAGNOSIS — Z125 Encounter for screening for malignant neoplasm of prostate: Secondary | ICD-10-CM | POA: Diagnosis not present

## 2016-06-17 DIAGNOSIS — E7849 Other hyperlipidemia: Secondary | ICD-10-CM

## 2016-06-17 DIAGNOSIS — Z114 Encounter for screening for human immunodeficiency virus [HIV]: Secondary | ICD-10-CM

## 2016-06-17 DIAGNOSIS — Z79899 Other long term (current) drug therapy: Secondary | ICD-10-CM

## 2016-06-17 LAB — HEPATIC FUNCTION PANEL
ALT: 12 U/L (ref 0–53)
AST: 14 U/L (ref 0–37)
Albumin: 4.3 g/dL (ref 3.5–5.2)
Alkaline Phosphatase: 83 U/L (ref 39–117)
Bilirubin, Direct: 0.1 mg/dL (ref 0.0–0.3)
Total Bilirubin: 0.3 mg/dL (ref 0.2–1.2)
Total Protein: 6.8 g/dL (ref 6.0–8.3)

## 2016-06-17 LAB — HEPATITIS C ANTIBODY: HCV Ab: NEGATIVE

## 2016-06-17 LAB — CBC WITH DIFFERENTIAL/PLATELET
Basophils Absolute: 0.1 10*3/uL (ref 0.0–0.1)
Basophils Relative: 0.6 % (ref 0.0–3.0)
Eosinophils Absolute: 0.3 10*3/uL (ref 0.0–0.7)
Eosinophils Relative: 3.1 % (ref 0.0–5.0)
HCT: 41.2 % (ref 39.0–52.0)
Hemoglobin: 14.2 g/dL (ref 13.0–17.0)
Lymphocytes Relative: 23.4 % (ref 12.0–46.0)
Lymphs Abs: 2.5 10*3/uL (ref 0.7–4.0)
MCHC: 34.4 g/dL (ref 30.0–36.0)
MCV: 91.5 fl (ref 78.0–100.0)
Monocytes Absolute: 0.9 10*3/uL (ref 0.1–1.0)
Monocytes Relative: 8.1 % (ref 3.0–12.0)
Neutro Abs: 6.9 10*3/uL (ref 1.4–7.7)
Neutrophils Relative %: 64.8 % (ref 43.0–77.0)
Platelets: 279 10*3/uL (ref 150.0–400.0)
RBC: 4.51 Mil/uL (ref 4.22–5.81)
RDW: 15.5 % (ref 11.5–15.5)
WBC: 10.6 10*3/uL — ABNORMAL HIGH (ref 4.0–10.5)

## 2016-06-17 LAB — LIPID PANEL
Cholesterol: 174 mg/dL (ref 0–200)
HDL: 31.9 mg/dL — ABNORMAL LOW (ref >39.00)
LDL Cholesterol: 120 mg/dL — ABNORMAL HIGH (ref 0–99)
NonHDL: 142.29
Total CHOL/HDL Ratio: 5
Triglycerides: 111 mg/dL (ref 0.0–149.0)
VLDL: 22.2 mg/dL (ref 0.0–40.0)

## 2016-06-17 LAB — PSA: PSA: 1.38 ng/mL (ref 0.10–4.00)

## 2016-06-17 LAB — BASIC METABOLIC PANEL
BUN: 13 mg/dL (ref 6–23)
CO2: 28 mEq/L (ref 19–32)
Calcium: 9.5 mg/dL (ref 8.4–10.5)
Chloride: 107 mEq/L (ref 96–112)
Creatinine, Ser: 0.95 mg/dL (ref 0.40–1.50)
GFR: 87.1 mL/min (ref >60.00)
Glucose, Bld: 110 mg/dL — ABNORMAL HIGH (ref 70–99)
Potassium: 4.4 mEq/L (ref 3.5–5.1)
Sodium: 143 mEq/L (ref 135–145)

## 2016-06-17 LAB — HIV ANTIBODY (ROUTINE TESTING W REFLEX): HIV 1&2 Ab, 4th Generation: NONREACTIVE

## 2016-06-22 ENCOUNTER — Ambulatory Visit (INDEPENDENT_AMBULATORY_CARE_PROVIDER_SITE_OTHER): Payer: 59 | Admitting: Family Medicine

## 2016-06-22 ENCOUNTER — Encounter: Payer: Self-pay | Admitting: Family Medicine

## 2016-06-22 VITALS — BP 92/60 | HR 77 | Temp 97.7°F | Ht 65.0 in | Wt 144.5 lb

## 2016-06-22 DIAGNOSIS — Z0000 Encounter for general adult medical examination without abnormal findings: Secondary | ICD-10-CM

## 2016-06-22 DIAGNOSIS — Z23 Encounter for immunization: Secondary | ICD-10-CM

## 2016-06-22 NOTE — Progress Notes (Signed)
Pre visit review using our clinic review tool, if applicable. No additional management support is needed unless otherwise documented below in the visit note. 

## 2016-06-22 NOTE — Progress Notes (Signed)
Dr. [name] T. Ahnna Dungan, MD, [facility] Medicine [place], [address] Phone: [phone] Fax: [phone]  06/22/2016  Patient: Isaiah Rangel, MRN: [id number], DOB: [birth year], 55 y.o.  Primary Physician:  Owens Loffler, MD   Chief Complaint  Patient presents with  . Annual Exam   Subjective:   Isaiah Rangel is a 55 y.o. pleasant patient who presents with the following:  Preventative Health Maintenance Visit:  Health Maintenance Summary Reviewed and updated, unless pt declines services.  Tobacco History Reviewed. Alcohol: No concerns, no excessive use Exercise Habits: heavy labor at work, rec at least 30 mins 5 times a week STD concerns: no risk or activity to increase risk Drug Use: None Encouraged self-testicular check  Health Maintenance  Topic Date Due  . COLONOSCOPY  07/02/2016  . TETANUS/TDAP  06/22/2026  . INFLUENZA VACCINE  Completed  . Hepatitis C Screening  Completed  . HIV Screening  Completed   Immunization History  Administered Date(s) Administered  . Influenza Whole 07/09/2010  . Influenza, Seasonal, Injecte, Preservative Fre 06/18/2015  . Influenza,inj,Quad PF,36+ Mos 06/22/2016  . Tdap 06/22/2016   Patient Active Problem List   Diagnosis Date Noted  . Coronary artery disease involving native coronary artery of native heart without angina pectoris 02/21/2016  . Cerebrovascular disease 04/29/2015  . Radicular pain of right lower back 12/05/2013  . S/P cardiac catheterization 04/19/2013  . Benign paroxysmal positional vertigo 02/04/2013  . Smoking 09/09/2011  . Hyperlipidemia 04/09/2010  . GERD 09/21/2008  . ERECTILE DYSFUNCTION 09/20/2008   Past Medical History:  Diagnosis Date  . Anxiety   . BPH (benign prostatic hyperplasia)   . Current smoker   . GERD (gastroesophageal reflux disease)   . OA (osteoarthritis)    Past Surgical History:  Procedure Laterality Date  . BACK SURGERY     . CARDIAC CATHETERIZATION  04/06/13   [facility]- minor luminal irregularities, otherwise normal cors; EF > 55%. Medical management and smoking cessation recommended  . KNEE ARTHROSCOPY W/ PARTIAL MEDIAL MENISCECTOMY Right 2012   [place], 2012  . NASAL SINUS SURGERY    . neck fusion    . VASECTOMY     Social History   Social History  . Marital status: Married    Spouse name: N/A  . Number of children: N/A  . Years of education: N/A   Occupational History  . [employer]    Social History Main Topics  . Smoking status: Current Every Day Smoker    Packs/day: 1.00    Years: 30.00    Types: Cigarettes  . Smokeless tobacco: Never Used     Comment: he is aware he needs to quit   . Alcohol use No  . Drug use: No  . Sexual activity: Yes    Partners: Female   Other Topics Concern  . 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 Arthritis Father   . Coronary artery disease Father   . Hypertension Father   . CVA Father 50  . Colon cancer Mother   . Heart attack Brother   . Heart attack Brother   . Coronary artery disease Brother 94  . Esophageal cancer Neg Hx   . Stomach cancer Neg Hx  No Known Allergies  Medication list has been reviewed and updated.   General: Denies fever, chills, sweats. No significant weight loss. Eyes: Denies blurring,significant itching ENT: Denies earache, sore throat, and hoarseness. Cardiovascular: Denies chest pains, palpitations, dyspnea on exertion Respiratory: Denies cough, dyspnea at rest,wheeezing Breast: no concerns about lumps GI: Denies nausea, vomiting, diarrhea, constipation, change in bowel habits, abdominal pain, melena, hematochezia GU: Denies penile discharge, ED, urinary flow / outflow problems. No STD concerns. Musculoskeletal: INTERMITTENT BACK PAIN Derm: Denies rash, itching Neuro: Denies   paresthesias, frequent falls, frequent headaches Psych: Denies depression, anxiety Endocrine: Denies cold intolerance, heat intolerance, polydipsia Heme: Denies enlarged lymph nodes Allergy: No hayfever  Objective:   BP 92/60   Pulse 77   Temp 97.7 F (36.5 C) (Oral)   Ht '5\' 5"'$  (1.651 m)   Wt 144 lb 8 oz (65.5 kg)   BMI 24.05 kg/m  Ideal Body Weight: Weight in (lb) to have BMI = 25: 149.9  No exam data present  GEN: well developed, well nourished, no acute distress Eyes: conjunctiva and lids normal, PERRLA, EOMI ENT: TM clear, nares clear, oral exam WNL Neck: supple, no lymphadenopathy, no thyromegaly, no JVD Pulm: clear to auscultation and percussion, respiratory effort normal CV: regular rate and rhythm, S1-S2, no murmur, rub or gallop, no bruits, peripheral pulses normal and symmetric, no cyanosis, clubbing, edema or varicosities GI: soft, non-tender; no hepatosplenomegaly, masses; active bowel sounds all quadrants GU: no hernia, testicular mass, penile discharge Lymph: no cervical, axillary or inguinal adenopathy MSK: gait normal, muscle tone and strength WNL, no joint swelling, effusions, discoloration, crepitus  SKIN: clear, good turgor, color WNL, no rashes, lesions, or ulcerations Neuro: normal mental status, normal strength, sensation, and motion Psych: alert; oriented to person, place and time, normally interactive and not anxious or depressed in appearance.  All labs reviewed with patient.  Lipids:    Component Value Date/Time   CHOL 174 06/17/2016 0816   CHOL 158 03/29/2013 0759   TRIG 111.0 06/17/2016 0816   TRIG 193 03/29/2013 0759   HDL 31.90 (L) 06/17/2016 0816   HDL 31 (L) 03/29/2013 0759   LDLDIRECT 139.4 02/23/2013 1526   VLDL 22.2 06/17/2016 0816   VLDL 39 03/29/2013 0759   CHOLHDL 5 06/17/2016 0816   CBC: CBC Latest Ref Rng & Units 06/17/2016 06/26/2015 04/10/2015  WBC 4.0 - 10.5 K/uL 10.6(H) 15.2(H) 9.3  Hemoglobin 13.0 - 17.0 g/dL 14.2 13.4  14.7  Hematocrit 39.0 - 52.0 % 41.2 40.4 43.7  Platelets 150.0 - 400.0 K/uL 279.0 224.0 983    Basic Metabolic Panel:    Component Value Date/Time   NA 143 06/17/2016 0816   NA 142 03/29/2013 0020   K 4.4 06/17/2016 0816   K 3.6 03/29/2013 0020   CL 107 06/17/2016 0816   CL 110 (H) 03/29/2013 0020   CO2 28 06/17/2016 0816   CO2 29 03/29/2013 0020   BUN 13 06/17/2016 0816   BUN 16 03/29/2013 0020   CREATININE 0.95 06/17/2016 0816   CREATININE 1.13 03/29/2013 0020   GLUCOSE 110 (H) 06/17/2016 0816   GLUCOSE 130 (H) 03/29/2013 0020   CALCIUM 9.5 06/17/2016 0816   CALCIUM 8.5 03/29/2013 0020   Hepatic Function Latest Ref Rng & Units 06/17/2016 02/23/2013 05/26/2012  Total Protein 6.0 - 8.3 g/dL 6.8 6.8 6.7  Albumin 3.5 - 5.2 g/dL 4.3 4.1 3.4  AST 0 - 37 U/L '14 19 29  '$ ALT 0 - 53 U/L 12 15 39  Alk Phosphatase 39 - 117 U/L 83 69 103  Total Bilirubin 0.2 - 1.2 mg/dL 0.3 0.3 0.2  Bilirubin, Direct 0.0 - 0.3 mg/dL 0.1 0.1 -    Lab Results  Component Value Date   TSH 1.40 06/26/2015   Lab Results  Component Value Date   PSA 1.38 06/17/2016   PSA 0.83 02/23/2013   PSA 0.76 04/06/2011    Assessment and Plan:   Healthcare maintenance  Need for prophylactic vaccination and inoculation against influenza - Plan: Flu Vaccine QUAD 36+ mos IM  Need for prophylactic vaccination with combined diphtheria-tetanus-pertussis (DTP) vaccine - Plan: Tdap vaccine greater than or equal to 7yo IM  Health Maintenance Exam: The patient's preventative maintenance and recommended screening tests for an annual wellness exam were reviewed in full today. Brought up to date unless services declined.  Counselled on the importance of diet, exercise, and its role in overall health and mortality. The patient's FH and SH was reviewed, including their home life, tobacco status, and drug and alcohol status.  Follow-up: No Follow-up on file. Unless noted, follow-up in 1 year for Health Maintenance  Exam.  Orders Placed This Encounter  Procedures  . Flu Vaccine QUAD 36+ mos IM  . Tdap vaccine greater than or equal to 7yo IM    Signed,  Elnore Cosens T. Sophia Cubero, MD   Patient's Medications  New Prescriptions   No medications on file  Previous Medications   ACETAMINOPHEN (TYLENOL) 500 MG TABLET    Take 1,000 mg by mouth every 6 (six) hours as needed.     ASPIRIN EC 81 MG TABLET    Take 81 mg by mouth daily.   HYOSCYAMINE (LEVSIN SL) 0.125 MG SL TABLET    PLACE 1 TABLET UNDER THE TONGUE AS NEEDED FOR CHEST PAIN OR SPASMS   ISOSORBIDE MONONITRATE (IMDUR) 30 MG 24 HR TABLET    Take 1 tablet (30 mg total) by mouth daily.   MULTIPLE VITAMIN (MULTIVITAMIN) TABLET    Take 1 tablet by mouth daily.     NITROGLYCERIN (NITROSTAT) 0.4 MG SL TABLET    DISSOLVE 1 TABLET UNDER THE TONGUE EVERY 5 MINUTES AS NEEDED FOR CHEST PAIN   NORTRIPTYLINE (PAMELOR) 25 MG CAPSULE    TAKE 1 CAPSULE(25 MG) BY MOUTH AT BEDTIME   OMEGA-3 FATTY ACIDS (FISH OIL) 1000 MG CAPS    Take 1 capsule by mouth daily.   VARENICLINE (CHANTIX CONTINUING MONTH PAK) 1 MG TABLET    Take 1 tablet (1 mg total) by mouth 2 (two) times daily.  Modified Medications   No medications on file  Discontinued Medications   PREDNISONE (DELTASONE) 20 MG TABLET    2 a day for 4 days, then 1 a day for 4 days, then 0.5 a day for 4 days. With food.

## 2016-08-01 ENCOUNTER — Other Ambulatory Visit: Payer: Self-pay | Admitting: Family Medicine

## 2016-09-05 ENCOUNTER — Other Ambulatory Visit: Payer: Self-pay | Admitting: Family Medicine

## 2016-09-29 ENCOUNTER — Ambulatory Visit (INDEPENDENT_AMBULATORY_CARE_PROVIDER_SITE_OTHER): Payer: 59 | Admitting: Family Medicine

## 2016-09-29 ENCOUNTER — Encounter: Payer: Self-pay | Admitting: Family Medicine

## 2016-09-29 VITALS — BP 106/62 | HR 79 | Temp 99.0°F | Ht 65.0 in | Wt 152.0 lb

## 2016-09-29 DIAGNOSIS — J111 Influenza due to unidentified influenza virus with other respiratory manifestations: Secondary | ICD-10-CM | POA: Diagnosis not present

## 2016-09-29 MED ORDER — ALBUTEROL SULFATE HFA 108 (90 BASE) MCG/ACT IN AERS: 2.0000 | INHALATION_SPRAY | RESPIRATORY_TRACT | 0 refills | Status: DC | PRN

## 2016-09-29 MED ORDER — OSELTAMIVIR PHOSPHATE 75 MG PO CAPS: 75.0000 mg | ORAL_CAPSULE | Freq: Two times a day (BID) | ORAL | 0 refills | Status: DC

## 2016-09-29 NOTE — Progress Notes (Signed)
Subjective:    Patient ID: Isaiah Rangel, male    DOB: 1960-09-12, 56 y.o.   MRN: [id number]  HPI Here for uri symptoms with body aches and fever He smokes (trying to quit)  He did have a flu shot   On Sunday he was more tired than usual  Monday started cough  Cough is prod of phlegm -no color to it  Some wheezing -esp if he is coughing hard   Temp has been high - (his temp baseline low)  At home - low grade  Chills and body aches - worse last night and this am - followed by sweats   Throat is a little scratchy  Ears ring  Nose is stuffy and also sneezing  No sinus pain   Has had a general headache    Temp: 99 F (37.2 C)   Patient Active Problem List   Diagnosis Date Noted  . Influenza 09/29/2016  . Coronary artery disease involving native coronary artery of native heart without angina pectoris 02/21/2016  . Cerebrovascular disease 04/29/2015  . Radicular pain of right lower back 12/05/2013  . S/P cardiac catheterization 04/19/2013  . Benign paroxysmal positional vertigo 02/04/2013  . Smoking 09/09/2011  . Hyperlipidemia 04/09/2010  . GERD 09/21/2008  . ERECTILE DYSFUNCTION 09/20/2008   Past Medical History:  Diagnosis Date  . Anxiety   . BPH (benign prostatic hyperplasia)   . Current smoker   . GERD (gastroesophageal reflux disease)   . OA (osteoarthritis)    Past Surgical History:  Procedure Laterality Date  . BACK SURGERY    . CARDIAC CATHETERIZATION  04/06/13   [facility]- minor luminal irregularities, otherwise normal cors; EF > 55%. Medical management and smoking cessation recommended  . KNEE ARTHROSCOPY W/ PARTIAL MEDIAL MENISCECTOMY Right 2012   [place], 2012  . NASAL SINUS SURGERY    . neck fusion    . VASECTOMY     Social History  Substance Use Topics  . Smoking status: Current Every Day Smoker    Packs/day: 0.50    Years: 30.00    Types: Cigarettes  . Smokeless tobacco: Never Used     Comment: he is aware he needs to quit   . Alcohol use No     Family History  Problem Relation Age of Onset  . Arthritis Father   . Coronary artery disease Father   . Hypertension Father   . CVA Father 82  . Colon cancer Mother   . Heart attack Brother   . Heart attack Brother   . Coronary artery disease Brother 7  . Esophageal cancer Neg Hx   . Stomach cancer Neg Hx    No Known Allergies Current Outpatient Prescriptions on File Prior to Visit  Medication Sig Dispense Refill  . acetaminophen (TYLENOL) 500 MG tablet Take 1,000 mg by mouth every 6 (six) hours as needed.      Marland Kitchen aspirin EC 81 MG tablet Take 81 mg by mouth daily.    . hyoscyamine (LEVSIN SL) 0.125 MG SL tablet PLACE 1 TABLET UNDER THE TONGUE AS NEEDED FOR CHEST PAIN OR SPASMS 90 tablet 0  . isosorbide mononitrate (IMDUR) 30 MG 24 hr tablet Take 1 tablet (30 mg total) by mouth daily. 90 tablet 4  . Multiple Vitamin (MULTIVITAMIN) tablet Take 1 tablet by mouth daily.      . nitroGLYCERIN (NITROSTAT) 0.4 MG SL tablet DISSOLVE 1 TABLET UNDER THE TONGUE EVERY 5 MINUTES AS NEEDED FOR CHEST PAIN 75 tablet 3  .  nortriptyline (PAMELOR) 25 MG capsule TAKE 1 CAPSULE(25 MG) BY MOUTH AT BEDTIME 30 capsule 5  . nortriptyline (PAMELOR) 25 MG capsule TAKE 1 CAPSULE(25 MG) BY MOUTH AT BEDTIME 90 capsule 3  . Omega-3 Fatty Acids (FISH OIL) 1000 MG CAPS Take 1 capsule by mouth daily.    . varenicline (CHANTIX CONTINUING MONTH PAK) 1 MG tablet Take 1 tablet (1 mg total) by mouth 2 (two) times daily. 60 tablet 6   No current facility-administered medications on file prior to visit.     Review of Systems  Constitutional: Positive for appetite change, chills, diaphoresis, fatigue and fever.  HENT: Positive for congestion, postnasal drip, rhinorrhea, sinus pressure, sneezing and sore throat. Negative for ear pain.   Eyes: Negative for pain and discharge.  Respiratory: Positive for cough. Negative for shortness of breath, wheezing and stridor.   Cardiovascular: Negative for chest pain.   Gastrointestinal: Negative for diarrhea, nausea and vomiting.  Genitourinary: Negative for frequency, hematuria and urgency.  Musculoskeletal: Negative for arthralgias and myalgias.  Skin: Negative for rash.  Neurological: Positive for headaches. Negative for dizziness, weakness and light-headedness.  Psychiatric/Behavioral: Negative for confusion and dysphoric mood.       Objective:   Physical Exam  Constitutional: He appears well-developed and well-nourished. No distress.  Appears fatigued   HENT:  Head: Normocephalic and atraumatic.  Right Ear: External ear normal.  Left Ear: External ear normal.  Mouth/Throat: Oropharynx is clear and moist.  Nares are injected and congested  No sinus tenderness Clear rhinorrhea and post nasal drip   Eyes: Conjunctivae and EOM are normal. Pupils are equal, round, and reactive to light. Right eye exhibits no discharge. Left eye exhibits no discharge.  Neck: Normal range of motion. Neck supple.  Cardiovascular: Normal rate and normal heart sounds.   Pulmonary/Chest: Effort normal and breath sounds normal. No respiratory distress. He has no wheezes. He has no rales. He exhibits no tenderness.  Diffusely distant bs Harsh bs  Good air exch  Wheeze only on forced exp No rales or rhonchi  Lymphadenopathy:    He has no cervical adenopathy.  Neurological: He is alert.  Skin: Skin is warm and dry. No rash noted. No pallor.  Psychiatric: He has a normal mood and affect.          Assessment & Plan:   Problem List Items Addressed This Visit      Respiratory   Influenza    In pt who had a flu shot - with uri symptoms fever and body aches Disc symptomatic care - see instructions on AVS  tamiflu px bid 5 d  Albuterol mdi with inst for use handout  Update if not starting to improve in a week or if worsening  -esp if wheezing or sob Enc to keep working on smoking cessation      Relevant Medications   oseltamivir (TAMIFLU) 75 MG capsule

## 2016-09-29 NOTE — Progress Notes (Signed)
Pre visit review using our clinic review tool, if applicable. No additional management support is needed unless otherwise documented below in the visit note. 

## 2016-09-29 NOTE — Assessment & Plan Note (Signed)
In pt who had a flu shot - with uri symptoms fever and body aches Disc symptomatic care - see instructions on AVS  tamiflu px bid 5 d  Albuterol mdi with inst for use handout  Update if not starting to improve in a week or if worsening  -esp if wheezing or sob Enc to keep working on smoking cessation

## 2016-09-29 NOTE — Patient Instructions (Addendum)
I think you have influenza  Take the tamiflu as directed  Drink lots of fluids and rest  Acetaminophen for fever as needed If you wheeze - use the albuterol inhaler Keep working on quitting smoking   Update if not starting to improve in a week or if worsening     Influenza, Adult Influenza, more commonly known as "the flu," is a viral infection that primarily affects the respiratory tract. The respiratory tract includes organs that help you breathe, such as the lungs, nose, and throat. The flu causes many common cold symptoms, as well as a high fever and body aches. The flu spreads easily from person to person (is contagious). Getting a flu shot (influenza vaccination) every year is the best way to prevent influenza. What are the causes? Influenza is caused by a virus. You can catch the virus by:  Breathing in droplets from an infected person's cough or sneeze.  Touching something that was recently contaminated with the virus and then touching your mouth, nose, or eyes. What increases the risk? The following factors may make you more likely to get the flu:  Not cleaning your hands frequently with soap and water or alcohol-based hand sanitizer.  Having close contact with many people during cold and flu season.  Touching your mouth, eyes, or nose without washing or sanitizing your hands first.  Not drinking enough fluids or not eating a healthy diet.  Not getting enough sleep or exercise.  Being under a high amount of stress.  Not getting a yearly (annual) flu shot. You may be at a higher risk of complications from the flu, such as a severe lung infection (pneumonia), if you:  Are over the age of 16.  Are pregnant.  Have a weakened disease-fighting system (immune system). You may have a weakened immune system if you:  Have HIV or AIDS.  Are undergoing chemotherapy.  Aretaking medicines that reduce the activity of (suppress) the immune system.  Have a long-term  (chronic) illness, such as heart disease, kidney disease, diabetes, or lung disease.  Have a liver disorder.  Are obese.  Have anemia. What are the signs or symptoms? Symptoms of this condition typically last 4-10 days and may include:  Fever.  Chills.  Headache, body aches, or muscle aches.  Sore throat.  Cough.  Runny or congested nose.  Chest discomfort and cough.  Poor appetite.  Weakness or tiredness (fatigue).  Dizziness.  Nausea or vomiting. How is this diagnosed? This condition may be diagnosed based on your medical history and a physical exam. Your health care provider may do a nose or throat swab test to confirm the diagnosis. How is this treated? If influenza is detected early, you can be treated with antiviral medicine that can reduce the length of your illness and the severity of your symptoms. This medicine may be given by mouth (orally) or through an IV tube that is inserted in one of your veins. The goal of treatment is to relieve symptoms by taking care of yourself at home. This may include taking over-the-counter medicines, drinking plenty of fluids, and adding humidity to the air in your home. In some cases, influenza goes away on its own. Severe influenza or complications from influenza may be treated in a hospital. Follow these instructions at home:  Take over-the-counter and prescription medicines only as told by your health care provider.  Use a cool mist humidifier to add humidity to the air in your home. This can make breathing easier.  Rest as needed.  Drink enough fluid to keep your urine clear or pale yellow.  Cover your mouth and nose when you cough or sneeze.  Wash your hands with soap and water often, especially after you cough or sneeze. If soap and water are not available, use hand sanitizer.  Stay home from work or school as told by your health care provider. Unless you are visiting your health care provider, try to avoid leaving  home until your fever has been gone for 24 hours without the use of medicine.  Keep all follow-up visits as told by your health care provider. This is important. How is this prevented?  Getting an annual flu shot is the best way to avoid getting the flu. You may get the flu shot in late summer, fall, or winter. Ask your health care provider when you should get your flu shot.  Wash your hands often or use hand sanitizer often.  Avoid contact with people who are sick during cold and flu season.  Eat a healthy diet, drink plenty of fluids, get enough sleep, and exercise regularly. Contact a health care provider if:  You develop new symptoms.  You have:  Chest pain.  Diarrhea.  A fever.  Your cough gets worse.  You produce more mucus.  You feel nauseous or you vomit. Get help right away if:  You develop shortness of breath or difficulty breathing.  Your skin or nails turn a bluish color.  You have severe pain or stiffness in your neck.  You develop a sudden headache or sudden pain in your face or ear.  You cannot stop vomiting. This information is not intended to replace advice given to you by your health care provider. Make sure you discuss any questions you have with your health care provider. Document Released: 08/14/2000 Document Revised: 01/23/2016 Document Reviewed: 06/11/2015 Elsevier Interactive Patient Education  2017 [employer].

## 2017-01-22 ENCOUNTER — Other Ambulatory Visit: Payer: Self-pay | Admitting: Cardiovascular Disease

## 2017-01-22 ENCOUNTER — Other Ambulatory Visit: Payer: Self-pay

## 2017-01-22 MED ORDER — NITROGLYCERIN 0.4 MG SL SUBL: 0.4000 mg | SUBLINGUAL_TABLET | SUBLINGUAL | 6 refills | Status: DC | PRN

## 2017-01-26 ENCOUNTER — Telehealth: Payer: Self-pay

## 2017-01-26 NOTE — Telephone Encounter (Signed)
-----   Message from Janan Ridge, [place] sent at 01/22/2017  4:06 PM EDT ----- Can you try to schedule a follow up appointment. Thanks!

## 2017-01-26 NOTE — Telephone Encounter (Signed)
l mom to schedule past due f/u 

## 2017-03-23 ENCOUNTER — Other Ambulatory Visit: Payer: Self-pay | Admitting: Cardiovascular Disease

## 2017-03-23 NOTE — Telephone Encounter (Signed)
Pt overdue for 6 month f/u has not been seen since 01/2016. Pt needs appointment.

## 2017-03-23 NOTE — Telephone Encounter (Signed)
Pt needs refill on [facility]osorbide [facility], and separate rx sent to [facility] on [address]

## 2017-03-24 ENCOUNTER — Other Ambulatory Visit: Payer: Self-pay | Admitting: Cardiovascular Disease

## 2017-03-25 ENCOUNTER — Telehealth: Payer: Self-pay | Admitting: Cardiovascular Disease

## 2017-03-25 NOTE — Telephone Encounter (Signed)
-----   Message from Janan Ridge, [place] sent at 03/25/2017  7:35 AM EDT ----- Will you try to schedule a follow up appointment with [facility] please. Patient was last seen 01/2016 and never followed back up for his 6 month follow up.

## 2017-03-25 NOTE — Telephone Encounter (Signed)
Lmov for patient to call back and schedule appointment  For refills Will try again at a later time

## 2017-04-07 ENCOUNTER — Encounter: Payer: Self-pay | Admitting: Family Medicine

## 2017-04-07 ENCOUNTER — Ambulatory Visit (INDEPENDENT_AMBULATORY_CARE_PROVIDER_SITE_OTHER): Payer: 59 | Admitting: Family Medicine

## 2017-04-07 VITALS — BP 91/57 | HR 80 | Temp 98.6°F | Ht 65.0 in | Wt 145.2 lb

## 2017-04-07 DIAGNOSIS — F321 Major depressive disorder, single episode, moderate: Secondary | ICD-10-CM | POA: Diagnosis not present

## 2017-04-07 MED ORDER — ESCITALOPRAM OXALATE 10 MG PO TABS: 10.0000 mg | ORAL_TABLET | Freq: Every day | ORAL | 3 refills | Status: DC

## 2017-04-07 NOTE — Patient Instructions (Signed)
Lexapro 1/2 tablet a day at night for 1 week,  Then increase to 1 tablet by mouth
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
## 2017-04-18 ENCOUNTER — Other Ambulatory Visit: Payer: Self-pay | Admitting: Cardiovascular Disease

## 2017-04-28 NOTE — Progress Notes (Signed)
Patient ID: Isaiah Rangel, male   DOB: 1961-02-15, 56 y.o.   MRN: [id number]   Cardiology Office Note  Date:  04/29/2017   ID:  Isaiah Rangel, DOB 12-23-1960, MRN [id number]  PCP:  Owens Loffler, MD   Chief Complaint  Patient presents with  . other    6 month follow up. Patient chest pain and SOB. Meds reviewed verbally with patient.     HPI:  56 year old gentleman who is very active at baseline who works as a Company secretary,  history of smoking Who continues to smoke   GERD,  remote neck injury with fusion surgery in 2002 with chronic neck pain and tightness,  chronic chest pain symptoms, relieved with nitroglycerin previous cardiac catheterization August 2014 showing minimal coronary artery disease,  CT coronary calcium score reviewed with him, score 135 presenting for follow-up of chest pain symptoms  Weight down 7 pounds Reports he has significant stress at home taking care of elderly father who is ill Trying to drink lots of Gatorade daily Denies having significant orthostasis symptoms  Initial blood pressure today 90 systolic, 086 systolic on my recheck Periodically takes nitroglycerin for chest pain Chest pain unrelated to exertion  On previous CT scan chest, coronary calcifications  in the distal left main, proximal LAD ostial region  EKG on today's visit shows normal sinus rhythm with rate 69 bpm, no significant ST or T-wave changes  Other past medical history Previous GI workup was essentially negative, had EGD In the past, Ran out of isosorbide, symptoms became worse , almost went to the emergency room.   reports he can have a good day with no symptoms, very active at work, moves [employer], very heavy items weighing several 100 pounds. Other days can be doing nothing with symptoms  Reports he has levsin, takes this occasionally, unclear if it helps  a stress echocardiogram was performed that showed no significant ischemia. He achieved a heart rate  greater than 150. Cardiac enzymes were negative during his hospital stay.  He did have knee surgery since his last visit. He continues to smoke one pack per day. He has tried Chantix in the past though had severe vertigo. He does also have Raynaud's in his hands  PMH:   has a past medical history of Anxiety; BPH (benign prostatic hyperplasia); Current smoker; GERD (gastroesophageal reflux disease); and OA (osteoarthritis).  PSH:    Past Surgical History:  Procedure Laterality Date  . BACK SURGERY    . CARDIAC CATHETERIZATION  04/06/13   [facility]- minor luminal irregularities, otherwise normal cors; EF > 55%. Medical management and smoking cessation recommended  . KNEE ARTHROSCOPY W/ PARTIAL MEDIAL MENISCECTOMY Right 2012   [place], 2012  . NASAL SINUS SURGERY    . neck fusion    . VASECTOMY      Current Outpatient Prescriptions  Medication Sig Dispense Refill  . acetaminophen (TYLENOL) 500 MG tablet Take 1,000 mg by mouth every 6 (six) hours as needed.      Marland Kitchen albuterol (PROVENTIL HFA;VENTOLIN HFA) 108 (90 Base) MCG/ACT inhaler Inhale 2 puffs into the lungs every 4 (four) hours as needed for wheezing. 1 Inhaler 0  . aspirin EC 81 MG tablet Take 81 mg by mouth daily.    Marland Kitchen escitalopram (LEXAPRO) 10 MG tablet Take 1 tablet (10 mg total) by mouth daily. 30 tablet 3  . hyoscyamine (LEVSIN SL) 0.125 MG SL tablet PLACE 1 TABLET UNDER THE TONGUE AS NEEDED FOR CHEST PAIN OR SPASMS 90  tablet 0  . isosorbide mononitrate (IMDUR) 30 MG 24 hr tablet TAKE 1 TABLET BY MOUTH  DAILY 30 tablet 0  . Multiple Vitamin (MULTIVITAMIN) tablet Take 1 tablet by mouth daily.      . nitroGLYCERIN (NITROSTAT) 0.4 MG SL tablet PLACE 1 TABLET UNDER THE TONGUE EVERY 5 MINUTES AS NEEDED FOR CHEST PAIN 25 tablet 0  . nortriptyline (PAMELOR) 25 MG capsule TAKE 1 CAPSULE(25 MG) BY MOUTH AT BEDTIME 30 capsule 5  . Omega-3 Fatty Acids (FISH OIL) 1000 MG CAPS Take 1 capsule by mouth daily.     No current facility-administered  medications for this visit.      Allergies:   Patient has no known allergies.   Social History:  The patient  reports that he has been smoking Cigarettes.  He has a 15.00 pack-year smoking history. He has never used smokeless tobacco. He reports that he does not drink alcohol or use drugs.   Family History:   family history includes Arthritis in his father; CVA (age of onset: 58) in his father; Colon cancer in his mother; Coronary artery disease in his father; Coronary artery disease (age of onset: 50) in his brother; Heart attack in his brother and brother; Hypertension in his father.    Review of Systems: Review of Systems  Constitutional: Negative.   Respiratory: Negative.   Cardiovascular: Positive for chest pain.  Gastrointestinal: Negative.   Musculoskeletal: Negative.   Neurological: Negative.   Psychiatric/Behavioral: Negative.   All other systems reviewed and are negative.    PHYSICAL EXAM: VS:  BP (!) 90/56 (BP Location: Left Arm, Patient Position: Sitting, Cuff Size: Normal)   Pulse 69   Ht 5\' 5"  (1.651 m)   Wt 144 lb 12 oz (65.7 kg)   BMI 24.09 kg/m  , BMI Body mass index is 24.09 kg/m. GEN: Well nourished, well developed, in no acute distress  HEENT: normal  Neck: no JVD, carotid bruits, or masses Cardiac: RRR; no murmurs, rubs, or gallops,no edema  Respiratory:  clear to auscultation bilaterally, normal work of breathing GI: soft, nontender, nondistended, + BS MS: no deformity or atrophy  Skin: warm and dry, no rash Neuro:  Strength and sensation are intact Psych: euthymic mood, full affect    Recent Labs: 06/17/2016: ALT 12; BUN 13; Creatinine, Ser 0.95; Hemoglobin 14.2; Platelets 279.0; Potassium 4.4; Sodium 143    Lipid Panel Lab Results  Component Value Date   CHOL 174 06/17/2016   HDL 31.90 (L) 06/17/2016   LDLCALC 120 (H) 06/17/2016   TRIG 111.0 06/17/2016      Wt Readings from Last 3 Encounters:  04/29/17 144 lb 12 oz (65.7 kg)   04/07/17 145 lb 4 oz (65.9 kg)  09/29/16 152 lb (68.9 kg)       ASSESSMENT AND PLAN:  Chest pain, unspecified chest pain type - Plan: EKG 12-Lead Chest pain Previously improved on isosorbide mononitrate, likely secondary to spasm Also improved with nitroglycerin Drop in blood pressure with 7 pound weight loss Suggested he try half pill of the isosorbide  Tobacco abuse We have encouraged him to continue to work on weaning his cigarettes and smoking cessation. He will continue to work on this and does not want any assistance with chantix.   Hyperlipidemia he does not want a statin at this time  Coronary artery disease involving native coronary artery of native heart without angina pectoris CT coronary calcium score is low, he does not want a statin He does not want  a statin at this time Recommended smoke cessation  Disposition:   F/U  12 months   Total encounter time more than 15 minutes  Greater than 50% was spent in counseling and coordination of care with the patient    Orders Placed This Encounter  Procedures  . EKG 12-Lead      Signed, Esmond Plants, M.D., Ph.D. 04/29/2017  [place], [place]

## 2017-04-29 ENCOUNTER — Ambulatory Visit (INDEPENDENT_AMBULATORY_CARE_PROVIDER_SITE_OTHER): Payer: 59 | Admitting: Cardiovascular Disease

## 2017-04-29 ENCOUNTER — Encounter: Payer: Self-pay | Admitting: Cardiovascular Disease

## 2017-04-29 VITALS — BP 90/56 | HR 69 | Ht 65.0 in | Wt 144.8 lb

## 2017-04-29 DIAGNOSIS — G8929 Other chronic pain: Secondary | ICD-10-CM | POA: Diagnosis not present

## 2017-04-29 DIAGNOSIS — F17200 Nicotine dependence, unspecified, uncomplicated: Secondary | ICD-10-CM | POA: Diagnosis not present

## 2017-04-29 DIAGNOSIS — R079 Chest pain, unspecified: Secondary | ICD-10-CM | POA: Diagnosis not present

## 2017-04-29 DIAGNOSIS — I2510 Atherosclerotic heart disease of native coronary artery without angina pectoris: Secondary | ICD-10-CM | POA: Diagnosis not present

## 2017-04-29 DIAGNOSIS — E782 Mixed hyperlipidemia: Secondary | ICD-10-CM | POA: Diagnosis not present

## 2017-04-29 DIAGNOSIS — IMO0001 Reserved for inherently not codable concepts without codable children: Secondary | ICD-10-CM

## 2017-04-29 NOTE — Patient Instructions (Signed)
Medication Instructions:   Try to wean down on the isosorbide (try 1/2 pill first)  Labwork:  No new labs needed  Testing/Procedures:  No further testing at this time   Follow-Up: It was a pleasure seeing you in the office today. Please call us if you have new issues that need to be addressed before your next appt.  [phone]  Your physician wants you to follow-up in: 12 months.  You will receive a reminder letter in the mail two months in advance. If you don't receive a letter, please call our office to schedule the follow-up appointment.  If you need a refill on your cardiac medications before your next appointment, please call your pharmacy.

## 2017-05-19 ENCOUNTER — Ambulatory Visit: Payer: 59 | Admitting: Family Medicine

## 2017-05-26 ENCOUNTER — Ambulatory Visit: Payer: 59 | Admitting: Family Medicine

## 2017-06-06 DIAGNOSIS — J209 Acute bronchitis, unspecified: Secondary | ICD-10-CM | POA: Diagnosis not present

## 2017-08-20 ENCOUNTER — Other Ambulatory Visit: Payer: Self-pay | Admitting: Cardiovascular Disease

## 2017-09-01 ENCOUNTER — Other Ambulatory Visit: Payer: Self-pay | Admitting: Family Medicine

## 2017-10-21 ENCOUNTER — Other Ambulatory Visit: Payer: Self-pay | Admitting: Family Medicine

## 2017-10-21 ENCOUNTER — Telehealth: Payer: Self-pay | Admitting: Family Medicine

## 2017-10-21 MED ORDER — OSELTAMIVIR PHOSPHATE 75 MG PO CAPS: 75.0000 mg | ORAL_CAPSULE | Freq: Two times a day (BID) | ORAL | 0 refills | Status: DC

## 2017-10-21 NOTE — Telephone Encounter (Signed)
I called him. He almost certainly has the flu. 102 temp. Arthralgias, cough, no energy at all.  His wife is an Therapist, sports and definitely thinks he has the flu.  I sent in some tamiflu to his pharmacy.

## 2017-10-21 NOTE — Telephone Encounter (Signed)
Wife following up on this request to be seen.  No appts at another office either. Wife  States he is going on day 2 of a fever. And feels really bad. Has diarrhea as well. Would like to be seen or something called in  [place], [place] - [place] [phone] (Phone) [phone] (Fax)

## 2017-10-21 NOTE — Telephone Encounter (Signed)
Pt has flu like symptoms. No appt's available. Pt doesn't want to go another day without being treated.

## 2017-11-25 ENCOUNTER — Emergency Department: Payer: 59

## 2017-11-25 ENCOUNTER — Other Ambulatory Visit: Payer: Self-pay

## 2017-11-25 ENCOUNTER — Observation Stay
Admission: EM | Admit: 2017-11-25 | Discharge: 2017-11-27 | Disposition: A | Discharge status: Home / Self Care | Payer: 59 | Attending: Internal Medicine | Admitting: Internal Medicine

## 2017-11-25 DIAGNOSIS — Z7982 Long term (current) use of aspirin: Secondary | ICD-10-CM | POA: Insufficient documentation

## 2017-11-25 DIAGNOSIS — R100 Acute abdomen: Secondary | ICD-10-CM | POA: Diagnosis not present

## 2017-11-25 DIAGNOSIS — K219 Gastro-esophageal reflux disease without esophagitis: Secondary | ICD-10-CM | POA: Diagnosis not present

## 2017-11-25 DIAGNOSIS — M1990 Unspecified osteoarthritis, unspecified site: Secondary | ICD-10-CM | POA: Diagnosis not present

## 2017-11-25 DIAGNOSIS — K529 Noninfective gastroenteritis and colitis, unspecified: Secondary | ICD-10-CM | POA: Diagnosis not present

## 2017-11-25 DIAGNOSIS — Z8249 Family history of ischemic heart disease and other diseases of the circulatory system: Secondary | ICD-10-CM | POA: Diagnosis not present

## 2017-11-25 DIAGNOSIS — K55019 Acute (reversible) ischemia of small intestine, extent unspecified: Secondary | ICD-10-CM | POA: Diagnosis present

## 2017-11-25 DIAGNOSIS — N281 Cyst of kidney, acquired: Secondary | ICD-10-CM | POA: Diagnosis not present

## 2017-11-25 DIAGNOSIS — K659 Peritonitis, unspecified: Secondary | ICD-10-CM | POA: Diagnosis not present

## 2017-11-25 DIAGNOSIS — F419 Anxiety disorder, unspecified: Secondary | ICD-10-CM | POA: Insufficient documentation

## 2017-11-25 DIAGNOSIS — F17210 Nicotine dependence, cigarettes, uncomplicated: Secondary | ICD-10-CM | POA: Diagnosis not present

## 2017-11-25 DIAGNOSIS — R109 Unspecified abdominal pain: Secondary | ICD-10-CM | POA: Diagnosis not present

## 2017-11-25 DIAGNOSIS — D72829 Elevated white blood cell count, unspecified: Secondary | ICD-10-CM | POA: Insufficient documentation

## 2017-11-25 DIAGNOSIS — K55059 Acute (reversible) ischemia of intestine, part and extent unspecified: Principal | ICD-10-CM | POA: Insufficient documentation

## 2017-11-25 DIAGNOSIS — J449 Chronic obstructive pulmonary disease, unspecified: Secondary | ICD-10-CM | POA: Insufficient documentation

## 2017-11-25 DIAGNOSIS — Z79899 Other long term (current) drug therapy: Secondary | ICD-10-CM | POA: Diagnosis not present

## 2017-11-25 DIAGNOSIS — R112 Nausea with vomiting, unspecified: Secondary | ICD-10-CM | POA: Diagnosis not present

## 2017-11-25 LAB — COMPREHENSIVE METABOLIC PANEL
ALT: 17 U/L (ref 17–63)
AST: 24 U/L (ref 15–41)
Albumin: 4.3 g/dL (ref 3.5–5.0)
Alkaline Phosphatase: 74 U/L (ref 38–126)
Anion gap: 10 (ref 5–15)
BUN: 17 mg/dL (ref 6–20)
CO2: 26 mmol/L (ref 22–32)
Calcium: 9.2 mg/dL (ref 8.9–10.3)
Chloride: 104 mmol/L (ref 101–111)
Creatinine, Ser: 1 mg/dL (ref 0.61–1.24)
GFR calc Af Amer: >60 mL/min (ref >60)
GFR calc non Af Amer: >60 mL/min (ref >60)
Glucose, Bld: 130 mg/dL — ABNORMAL HIGH (ref 65–99)
Potassium: 3.9 mmol/L (ref 3.5–5.1)
Sodium: 140 mmol/L (ref 135–145)
Total Bilirubin: 0.6 mg/dL (ref 0.3–1.2)
Total Protein: 7.4 g/dL (ref 6.5–8.1)

## 2017-11-25 LAB — CBC
HCT: 50 % (ref 40.0–52.0)
Hemoglobin: 16.5 g/dL (ref 13.0–18.0)
MCH: 31 pg (ref 26.0–34.0)
MCHC: 32.9 g/dL (ref 32.0–36.0)
MCV: 94.3 fL (ref 80.0–100.0)
Platelets: 248 10*3/uL (ref 150–440)
RBC: 5.31 MIL/uL (ref 4.40–5.90)
RDW: 15.7 % — ABNORMAL HIGH (ref 11.5–14.5)
WBC: 27.9 10*3/uL — ABNORMAL HIGH (ref 3.8–10.6)

## 2017-11-25 LAB — LIPASE, BLOOD: Lipase: 28 U/L (ref 11–51)

## 2017-11-25 MED ORDER — ONDANSETRON HCL 4 MG/2ML IJ SOLN
4.0000 mg | Freq: Once | INTRAMUSCULAR | Status: AC
  Administered 2017-11-26: 4 mg via INTRAVENOUS
  Filled 2017-11-25: qty 2

## 2017-11-25 MED ORDER — SODIUM CHLORIDE 0.9 % IV BOLUS
1000.0000 mL | Freq: Once | INTRAVENOUS | Status: AC
  Administered 2017-11-26: 1000 mL via INTRAVENOUS

## 2017-11-25 MED ORDER — IOPAMIDOL (ISOVUE-300) INJECTION 61%
100.0000 mL | Freq: Once | INTRAVENOUS | Status: AC | PRN
  Administered 2017-11-25: 100 mL via INTRAVENOUS

## 2017-11-25 MED ORDER — MORPHINE SULFATE (PF) 4 MG/ML IV SOLN
4.0000 mg | Freq: Once | INTRAVENOUS | Status: AC
  Administered 2017-11-26: 4 mg via INTRAVENOUS
  Filled 2017-11-25: qty 1

## 2017-11-25 MED ORDER — ONDANSETRON HCL 4 MG/2ML IJ SOLN
4.0000 mg | Freq: Once | INTRAMUSCULAR | Status: AC
  Administered 2017-11-25: 4 mg via INTRAVENOUS
  Filled 2017-11-25: qty 2

## 2017-11-25 MED ORDER — MORPHINE SULFATE (PF) 4 MG/ML IV SOLN
6.0000 mg | Freq: Once | INTRAVENOUS | Status: AC
Start: 2017-11-25 — End: 2017-11-25
  Administered 2017-11-25: 6 mg via INTRAVENOUS
  Filled 2017-11-25: qty 2

## 2017-11-25 NOTE — ED Triage Notes (Signed)
Pt arrived via EMS from home with complaints of lower abdominal pain that started around 8:30pm. Pt alert and oriented x4. Pt also reported having nausea and vomiting an hour ago. VS per EMS BP 120/62 HR-72 R-18 BS-82. Pt has Hx of Angina but denies any chest pain today. Wife is at bedside.

## 2017-11-25 NOTE — ED Provider Notes (Signed)
[facility] Emergency Department Provider Note  ____________________________________________   First MD Initiated Contact with Patient 11/25/17 2322     (approximate)  I have reviewed the triage vital signs and the nursing notes.   HISTORY  Chief Complaint Abdominal Pain    HPI Isaiah Rangel is a 57 y.o. male who comes to the emergency department via EMS with sudden onset severe lower abdominal pain that began roughly 2 hours prior to arrival.  The pain is severe constant throbbing.  Worse with movement improved with rest.  He has no history of abdominal surgeries.  He reports some nausea but no vomiting.  No diarrhea.  No trauma.  The pain is nonradiating.  Past Medical History:  Diagnosis Date  . Anxiety   . BPH (benign prostatic hyperplasia)   . Current smoker   . GERD (gastroesophageal reflux disease)   . OA (osteoarthritis)     Patient Active Problem List   Diagnosis Date Noted  . Coronary artery disease involving native coronary artery of native heart without angina pectoris 02/21/2016  . Chronic chest pain 06/27/2015  . Cerebrovascular disease 04/29/2015  . Radicular pain of right lower back 12/05/2013  . S/P cardiac catheterization 04/19/2013  . Smoking 09/09/2011  . Hyperlipidemia 04/09/2010  . GERD 09/21/2008  . ERECTILE DYSFUNCTION 09/20/2008    Past Surgical History:  Procedure Laterality Date  . BACK SURGERY    . CARDIAC CATHETERIZATION  04/06/13   [facility]- minor luminal irregularities, otherwise normal cors; EF > 55%. Medical management and smoking cessation recommended  . KNEE ARTHROSCOPY W/ PARTIAL MEDIAL MENISCECTOMY Right 2012   [place], 2012  . NASAL SINUS SURGERY    . neck fusion    . VASECTOMY      Prior to Admission medications   Medication Sig Start Date End Date Taking? Authorizing Provider  acetaminophen (TYLENOL) 500 MG tablet Take 1,000 mg by mouth every 6 (six) hours as needed.      [provider]    albuterol (PROVENTIL HFA;VENTOLIN HFA) 108 (90 Base) MCG/ACT inhaler Inhale 2 puffs into the lungs every 4 (four) hours as needed for wheezing. 09/29/16   Tower, Wynelle Fanny, MD  aspirin EC 81 MG tablet Take 81 mg by mouth daily.    [provider]  escitalopram (LEXAPRO) 10 MG tablet TAKE 1 TABLET(10 MG) BY MOUTH DAILY 09/01/17   [name], [name], MD  hyoscyamine (LEVSIN SL) 0.125 MG SL tablet PLACE 1 TABLET UNDER THE TONGUE AS NEEDED FOR CHEST PAIN OR SPASMS 10/24/15   Esterwood, Amy S, PA-C  isosorbide mononitrate (IMDUR) 30 MG 24 hr tablet TAKE 1 TABLET BY MOUTH  DAILY 08/20/17   Minna Merritts, MD  Multiple Vitamin (MULTIVITAMIN) tablet Take 1 tablet by mouth daily.      [provider]  nitroGLYCERIN (NITROSTAT) 0.4 MG SL tablet PLACE 1 TABLET UNDER THE TONGUE EVERY 5 MINUTES AS NEEDED FOR CHEST PAIN 01/22/17   Minna Merritts, MD  nortriptyline (PAMELOR) 25 MG capsule TAKE 1 CAPSULE(25 MG) BY MOUTH AT BEDTIME 03/04/16   [name], Spencer, MD  Omega-3 Fatty Acids (FISH OIL) 1000 MG CAPS Take 1 capsule by mouth daily.    [provider]  oseltamivir (TAMIFLU) 75 MG capsule Take 1 capsule (75 mg total) by mouth 2 (two) times daily. 10/21/17   Owens Loffler, MD    Allergies Patient has no known allergies.  Family History  Problem Relation Age of Onset  . Arthritis Father   .  Coronary artery disease Father   . Hypertension Father   . CVA Father 18  . Colon cancer Mother   . Heart attack Brother   . Heart attack Brother   . Coronary artery disease Brother 64  . Esophageal cancer Neg Hx   . Stomach cancer Neg Hx     Social History Social History   Tobacco Use  . Smoking status: Current Every Day Smoker    Packs/day: 0.50    Years: 30.00    Pack years: 15.00    Types: Cigarettes  . Smokeless tobacco: Never Used  . Tobacco comment: he is aware he needs to quit   Substance Use Topics  . Alcohol use: No    Alcohol/week: 0.0 oz  . Drug use: No     Review of Systems Constitutional: No fever/chills Eyes: No visual changes. ENT: No sore throat. Cardiovascular: Denies chest pain. Respiratory: Denies shortness of breath. Gastrointestinal: Positive for abdominal pain.  Positive for nausea, no vomiting.  No diarrhea.  No constipation. Genitourinary: Negative for dysuria. Musculoskeletal: Negative for back pain. Skin: Negative for rash. Neurological: Negative for headaches, focal weakness or numbness.   ____________________________________________   PHYSICAL EXAM:  VITAL SIGNS: ED Triage Vitals  Enc Vitals Group     BP 11/25/17 2247 119/78     Pulse Rate 11/25/17 2247 63     Resp 11/25/17 2247 18     Temp 11/25/17 2247 (!) 97.1 F (36.2 C)     Temp Source 11/25/17 2247 Oral     SpO2 11/25/17 2247 95 %     Weight 11/25/17 2248 137 lb (62.1 kg)     Height 11/25/17 2248 5\' 5"  (1.651 m)     Head Circumference --      Peak Flow --      Pain Score 11/25/17 2247 10     Pain Loc --      Pain Edu? --      Excl. in [place]? --     Constitutional: Alert and oriented x4 miserable appearing curled on his side crying Eyes: PERRL EOMI. Head: Atraumatic. Nose: No congestion/rhinnorhea. Mouth/Throat: No trismus Neck: No stridor.   Cardiovascular: Normal rate, regular rhythm. Grossly normal heart sounds.  Good peripheral circulation. Respiratory: Normal respiratory effort.  No retractions. Lungs CTAB and moving good air Gastrointestinal: Diffusely tender abdomen with no specific focality but rebound and guarding and frankly peritoneal take Musculoskeletal: No lower extremity edema   Neurologic:  Normal speech and language. No gross focal neurologic deficits are appreciated. Skin:  Skin is warm, dry and intact. No rash noted. Psychiatric: Mood and affect are normal. Speech and behavior are normal.    ____________________________________________   DIFFERENTIAL includes but not limited to  Appendicitis, diverticulitis,  perforation, bowel obstruction ____________________________________________   LABS (all labs ordered are listed, but only abnormal results are displayed)  Labs Reviewed  COMPREHENSIVE METABOLIC PANEL - Abnormal; Notable for the following components:      Result Value   Glucose, Bld 130 (*)    All other components within normal limits  CBC - Abnormal; Notable for the following components:   WBC 27.9 (*)    RDW 15.7 (*)    All other components within normal limits  CULTURE, BLOOD (ROUTINE X 2)  CULTURE, BLOOD (ROUTINE X 2)  LIPASE, BLOOD  URINALYSIS, COMPLETE (UACMP) WITH MICROSCOPIC  LACTIC ACID, PLASMA  LACTIC ACID, PLASMA    Lab work reviewed by me shows white count of 28 which is concerning  for acute infection __________________________________________  EKG   ____________________________________________  RADIOLOGY  CT abdomen pelvis reviewed by me shows diffuse enteritis along with free fluid ____________________________________________   PROCEDURES  Procedure(s) performed: no  .Critical Care Performed by: Darel Hong, MD Authorized by: Darel Hong, MD   Critical care provider statement:    Critical care time (minutes):  30   Critical care time was exclusive of:  Separately billable procedures and treating other patients   Critical care was necessary to treat or prevent imminent or life-threatening deterioration of the following conditions:  Sepsis   Critical care was time spent personally by me on the following activities:  Development of treatment plan with patient or surrogate, discussions with consultants, evaluation of patient's response to treatment, examination of patient, obtaining history from patient or surrogate, ordering and performing treatments and interventions, ordering and review of laboratory studies, ordering and review of radiographic studies, pulse oximetry, re-evaluation of patient's condition and review of old charts    Critical  Care performed: Yes  Observation: no ____________________________________________   INITIAL IMPRESSION / ASSESSMENT AND PLAN / ED COURSE  Pertinent labs & imaging results that were available during my care of the patient were reviewed by me and considered in my medical decision making (see chart for details).       ----------------------------------------- 12:32 AM on 11/26/2017 -----------------------------------------  The patient CT is back showing jejunal and ileal enteritis with no obvious free air but does have some free fluid.  I reviewed the CT and the case with on-call general surgeon Dr. Rosana Hoes who recommends lactic acid, cultures, IV antibiotics, and admission to the medicine service.  No indication for operative management at this time however he will kindly consult on the case.  I discussed with the patient and his wife verbalized understanding and agreement with the plan.  I then discussed with the hospitalist who has graciously agreed to admit the patient to his service.  The patient has required a total of 10 mg of IV morphine and his pain is improved but not resolved.  This along with a white count of 28 and significant abdominal tenderness necessitates inpatient admission. ____________________________________________   FINAL CLINICAL IMPRESSION(S) / ED DIAGNOSES  Final diagnoses:  Enteritis      NEW MEDICATIONS STARTED DURING THIS VISIT:  New Prescriptions   No medications on file     Note:  This document was prepared using Dragon voice recognition software and may include unintentional dictation errors.     Darel Hong, MD 11/26/17 [phone]

## 2017-11-25 NOTE — ED Notes (Signed)
Placed pt on 3L nasal cannula for comfort. O2 saturation was in the high 80s. Pt now at 92%

## 2017-11-26 ENCOUNTER — Other Ambulatory Visit: Payer: Self-pay

## 2017-11-26 DIAGNOSIS — K529 Noninfective gastroenteritis and colitis, unspecified: Secondary | ICD-10-CM | POA: Diagnosis not present

## 2017-11-26 DIAGNOSIS — J449 Chronic obstructive pulmonary disease, unspecified: Secondary | ICD-10-CM | POA: Diagnosis not present

## 2017-11-26 DIAGNOSIS — Z720 Tobacco use: Secondary | ICD-10-CM | POA: Diagnosis not present

## 2017-11-26 DIAGNOSIS — K55019 Acute (reversible) ischemia of small intestine, extent unspecified: Secondary | ICD-10-CM | POA: Diagnosis not present

## 2017-11-26 LAB — BASIC METABOLIC PANEL
Anion gap: 9 (ref 5–15)
BUN: 17 mg/dL (ref 6–20)
CO2: 25 mmol/L (ref 22–32)
Calcium: 8.3 mg/dL — ABNORMAL LOW (ref 8.9–10.3)
Chloride: 106 mmol/L (ref 101–111)
Creatinine, Ser: 0.96 mg/dL (ref 0.61–1.24)
GFR calc Af Amer: >60 mL/min (ref >60)
GFR calc non Af Amer: >60 mL/min (ref >60)
Glucose, Bld: 130 mg/dL — ABNORMAL HIGH (ref 65–99)
Potassium: 4.1 mmol/L (ref 3.5–5.1)
Sodium: 140 mmol/L (ref 135–145)

## 2017-11-26 LAB — GASTROINTESTINAL PANEL BY PCR, STOOL (REPLACES STOOL CULTURE)

## 2017-11-26 LAB — CBC
HCT: 41.9 % (ref 40.0–52.0)
Hemoglobin: 14.1 g/dL (ref 13.0–18.0)
MCH: 31.3 pg (ref 26.0–34.0)
MCHC: 33.6 g/dL (ref 32.0–36.0)
MCV: 93.2 fL (ref 80.0–100.0)
Platelets: 220 10*3/uL (ref 150–440)
RBC: 4.5 MIL/uL (ref 4.40–5.90)
RDW: 15.5 % — ABNORMAL HIGH (ref 11.5–14.5)
WBC: 26.8 10*3/uL — ABNORMAL HIGH (ref 3.8–10.6)

## 2017-11-26 LAB — URINALYSIS, COMPLETE (UACMP) WITH MICROSCOPIC
Bacteria, UA: NONE SEEN
Bilirubin Urine: NEGATIVE
Glucose, UA: NEGATIVE mg/dL
Hgb urine dipstick: NEGATIVE
Ketones, ur: NEGATIVE mg/dL
Leukocytes, UA: NEGATIVE
Nitrite: NEGATIVE
Protein, ur: NEGATIVE mg/dL
Specific Gravity, Urine: 1.04 — ABNORMAL HIGH (ref 1.005–1.030)
pH: 6 (ref 5.0–8.0)

## 2017-11-26 LAB — GLUCOSE, CAPILLARY: Glucose-Capillary: 109 mg/dL — ABNORMAL HIGH (ref 65–99)

## 2017-11-26 LAB — LACTIC ACID, PLASMA
Lactic Acid, Venous: 1.5 mmol/L (ref 0.5–1.9)
Lactic Acid, Venous: 1.1 mmol/L (ref 0.5–1.9)

## 2017-11-26 MED ORDER — ESCITALOPRAM OXALATE 10 MG PO TABS
10.0000 mg | ORAL_TABLET | Freq: Every day | ORAL | Status: DC
  Administered 2017-11-26 – 2017-11-27 (×2): 10 mg via ORAL
  Filled 2017-11-26 (×2): qty 1

## 2017-11-26 MED ORDER — ACETAMINOPHEN 325 MG PO TABS: 650.0000 mg | ORAL_TABLET | Freq: Four times a day (QID) | ORAL | Status: DC | PRN

## 2017-11-26 MED ORDER — ADULT MULTIVITAMIN W/MINERALS CH
1.0000 | ORAL_TABLET | Freq: Every day | ORAL | Status: DC
  Administered 2017-11-26 – 2017-11-27 (×2): 1 via ORAL
  Filled 2017-11-26 (×2): qty 1

## 2017-11-26 MED ORDER — HEPARIN SODIUM (PORCINE) 5000 UNIT/ML IJ SOLN: 5000.0000 [IU] | Freq: Three times a day (TID) | INTRAMUSCULAR | Status: DC

## 2017-11-26 MED ORDER — CIPROFLOXACIN HCL 500 MG PO TABS
500.0000 mg | ORAL_TABLET | Freq: Two times a day (BID) | ORAL | Status: DC
  Administered 2017-11-26 – 2017-11-27 (×2): 500 mg via ORAL
  Filled 2017-11-26 (×3): qty 1

## 2017-11-26 MED ORDER — ACETAMINOPHEN 650 MG RE SUPP: 650.0000 mg | Freq: Four times a day (QID) | RECTAL | Status: DC | PRN

## 2017-11-26 MED ORDER — METRONIDAZOLE 500 MG PO TABS
500.0000 mg | ORAL_TABLET | Freq: Three times a day (TID) | ORAL | Status: DC
  Administered 2017-11-26 – 2017-11-27 (×3): 500 mg via ORAL
  Filled 2017-11-26 (×3): qty 1

## 2017-11-26 MED ORDER — ONDANSETRON HCL 4 MG PO TABS: 4.0000 mg | ORAL_TABLET | Freq: Four times a day (QID) | ORAL | Status: DC | PRN

## 2017-11-26 MED ORDER — DOCUSATE SODIUM 100 MG PO CAPS
100.0000 mg | ORAL_CAPSULE | Freq: Two times a day (BID) | ORAL | Status: DC
  Administered 2017-11-26 – 2017-11-27 (×3): 100 mg via ORAL
  Filled 2017-11-26 (×3): qty 1

## 2017-11-26 MED ORDER — ISOSORBIDE MONONITRATE ER 30 MG PO TB24
30.0000 mg | ORAL_TABLET | Freq: Every day | ORAL | Status: DC
  Administered 2017-11-26 – 2017-11-27 (×2): 30 mg via ORAL
  Filled 2017-11-26 (×2): qty 1

## 2017-11-26 MED ORDER — MORPHINE SULFATE (PF) 2 MG/ML IV SOLN: 2.0000 mg | INTRAVENOUS | Status: DC | PRN

## 2017-11-26 MED ORDER — METRONIDAZOLE IN NACL 5-0.79 MG/ML-% IV SOLN
500.0000 mg | Freq: Once | INTRAVENOUS | Status: AC
  Administered 2017-11-26: 500 mg via INTRAVENOUS
  Filled 2017-11-26: qty 100

## 2017-11-26 MED ORDER — HYDROCODONE-ACETAMINOPHEN 5-325 MG PO TABS: 1.0000 | ORAL_TABLET | ORAL | Status: DC | PRN

## 2017-11-26 MED ORDER — BISACODYL 5 MG PO TBEC: 5.0000 mg | DELAYED_RELEASE_TABLET | Freq: Every day | ORAL | Status: DC | PRN

## 2017-11-26 MED ORDER — ONDANSETRON HCL 4 MG/2ML IJ SOLN: 4.0000 mg | Freq: Four times a day (QID) | INTRAMUSCULAR | Status: DC | PRN

## 2017-11-26 MED ORDER — SODIUM CHLORIDE 0.9 % IV SOLN
INTRAVENOUS | Status: DC
  Administered 2017-11-26 – 2017-11-27 (×3): via INTRAVENOUS

## 2017-11-26 MED ORDER — ALBUTEROL SULFATE (2.5 MG/3ML) 0.083% IN NEBU: 2.5000 mg | INHALATION_SOLUTION | RESPIRATORY_TRACT | Status: DC | PRN

## 2017-11-26 MED ORDER — SODIUM CHLORIDE 0.9 % IV SOLN
1.0000 g | Freq: Once | INTRAVENOUS | Status: AC
  Administered 2017-11-26: 1 g via INTRAVENOUS
  Filled 2017-11-26: qty 10

## 2017-11-26 NOTE — Consult Note (Addendum)
Vonda Antigua, MD [address], [place], [place], [place], [address] [address], [place], [place], [place], [address] Phone: [phone]  Fax: [phone]  Consultation  Referring Provider:     Dr. Darvin Neighbours Primary Care Physician:  Owens Loffler, MD Reason for Consultation:    Abdominal pain, enteritis  Date of Admission:  11/25/2017 Date of Consultation:  11/26/2017         HPI:   Isaiah Rangel is a 57 y.o. male who presented to the hospital due to 4 to 5-hour history of bilateral lower quadrant, sharp 10/10 abdominal pain, with no radiation, constant.  This is resolved completely at this time since getting morphine last night.  Reports 1-2 episodes of vomiting at home yesterday, with no blood.  This is completely resolved as well.  No fever or chills.  No previous history of similar symptoms.  Patient denies any diarrhea.  Patient states he had a bowel movement yesterday morning, well before the abdominal pain started.  It was formed.  With no blood.  He reports taking ibuprofen recently due to back pain.  Abdominal pain on presentation, which is resolved at this time, with CT showing enteritis  Patient had an EGD in March 2017 that was normal.  Last colonoscopy was in 2012 and polyps were removed.  Past Medical History:  Diagnosis Date  . Anxiety   . BPH (benign prostatic hyperplasia)   . Current smoker   . GERD (gastroesophageal reflux disease)   . OA (osteoarthritis)     Past Surgical History:  Procedure Laterality Date  . BACK SURGERY    . CARDIAC CATHETERIZATION  04/06/13   [facility]- minor luminal irregularities, otherwise normal cors; EF > 55%. Medical management and smoking cessation recommended  . KNEE ARTHROSCOPY W/ PARTIAL MEDIAL MENISCECTOMY Right 2012   [place], 2012  . NASAL SINUS SURGERY    . neck fusion    . VASECTOMY      Prior to Admission medications   Medication Sig Start Date End Date Taking? Authorizing Provider  acetaminophen (TYLENOL) 500  MG tablet Take 1,000 mg by mouth every 6 (six) hours as needed.     Yes [provider]  albuterol (PROVENTIL HFA;VENTOLIN HFA) 108 (90 Base) MCG/ACT inhaler Inhale 2 puffs into the lungs every 4 (four) hours as needed for wheezing. 09/29/16  Yes Tower, Wynelle Fanny, MD  aspirin EC 81 MG tablet Take 81 mg by mouth daily.   Yes [provider]  isosorbide mononitrate (IMDUR) 30 MG 24 hr tablet TAKE 1 TABLET BY MOUTH  DAILY 08/20/17  Yes Gollan, Kathlene November, MD  Multiple Vitamin (MULTIVITAMIN) tablet Take 1 tablet by mouth daily.     Yes [provider]  nitroGLYCERIN (NITROSTAT) 0.4 MG SL tablet PLACE 1 TABLET UNDER THE TONGUE EVERY 5 MINUTES AS NEEDED FOR CHEST PAIN 01/22/17  Yes Gollan, Kathlene November, MD  Omega-3 Fatty Acids (FISH OIL) 1000 MG CAPS Take 1 capsule by mouth daily.   Yes [provider]  escitalopram (LEXAPRO) 10 MG tablet TAKE 1 TABLET(10 MG) BY MOUTH DAILY Patient not taking: Reported on 11/26/2017 09/01/17   [name], [name], MD  hyoscyamine (LEVSIN SL) 0.125 MG SL tablet PLACE 1 TABLET UNDER THE TONGUE AS NEEDED FOR CHEST PAIN OR SPASMS Patient not taking: Reported on 11/26/2017 10/24/15   Esterwood, Amy S, PA-C  nortriptyline (PAMELOR) 25 MG capsule TAKE 1 CAPSULE(25 MG) BY MOUTH AT BEDTIME Patient not taking: Reported on 11/26/2017 03/04/16   [name],  [name], MD  oseltamivir (TAMIFLU) 75 MG capsule Take 1 capsule (75 mg total) by mouth 2 (two) times daily. Patient not taking: Reported on 11/26/2017 10/21/17   Owens Loffler, MD    Family History  Problem Relation Age of Onset  . Arthritis Father   . Coronary artery disease Father   . Hypertension Father   . CVA Father 101  . Colon cancer Mother   . Heart attack Brother   . Heart attack Brother   . Coronary artery disease Brother 26  . Esophageal cancer Neg Hx   . Stomach cancer Neg Hx      Social History   Tobacco Use  . Smoking status: Current Every Day Smoker    Packs/day: 0.50    Years: 30.00     Pack years: 15.00    Types: Cigarettes  . Smokeless tobacco: Never Used  . Tobacco comment: he is aware he needs to quit   Substance Use Topics  . Alcohol use: No    Alcohol/week: 0.0 oz  . Drug use: No    Allergies as of 11/25/2017  . (No Known Allergies)    Review of Systems:    All systems reviewed and negative except where noted in HPI.   Physical Exam:  Vital signs in last 24 hours: Vitals:   11/26/17 0215 11/26/17 0238 11/26/17 0439 11/26/17 0822  BP: 122/84 126/83 116/76 104/62  Pulse: 74 83 76 68  Resp: 15  18 20   Temp:  98.7 F (37.1 C) 98.8 F (37.1 C) 98.7 F (37.1 C)  TempSrc:  Oral Oral Oral  SpO2: 97% 98% 93% 90%  Weight:   139 lb 8 oz (63.3 kg)   Height:       Last BM Date: 11/25/17 General:   Pleasant, cooperative in NAD Head:  Normocephalic and atraumatic. Eyes:   No icterus.   Conjunctiva pink. PERRLA. Ears:  Normal auditory acuity. Neck:  Supple; no masses or thyroidomegaly Lungs: Respirations even and unlabored. Lungs clear to auscultation bilaterally.   No wheezes, crackles, or rhonchi.  Abdomen:  Soft, nondistended, nontender. Normal bowel sounds. No appreciable masses or hepatomegaly.  No rebound or guarding.  Neurologic:  Alert and oriented x3;  grossly normal neurologically. Skin:  Intact without significant lesions or rashes. Cervical Nodes:  No significant cervical adenopathy. Psych:  Alert and cooperative. Normal affect.  LAB RESULTS: Recent Labs    11/25/17 2259 11/26/17 0316  WBC 27.9* 26.8*  HGB 16.5 14.1  HCT 50.0 41.9  PLT 248 220   BMET Recent Labs    11/25/17 2259 11/26/17 0316  NA 140 140  K 3.9 4.1  CL 104 106  CO2 26 25  GLUCOSE 130* 130*  BUN 17 17  CREATININE 1.00 0.96  CALCIUM 9.2 8.3*   LFT Recent Labs    11/25/17 2259  PROT 7.4  ALBUMIN 4.3  AST 24  ALT 17  ALKPHOS 74  BILITOT 0.6   PT/INR No results for input(s): LABPROT, INR in the last 72 hours.  STUDIES: Ct Abdomen Pelvis W  Contrast  Result Date: 11/26/2017 CLINICAL DATA:  Abdominal pain EXAM: CT ABDOMEN AND PELVIS WITH CONTRAST TECHNIQUE: Multidetector CT imaging of the abdomen and pelvis was performed using the standard protocol following bolus administration of intravenous contrast. CONTRAST:  175mL ISOVUE-300 IOPAMIDOL (ISOVUE-300) INJECTION 61% COMPARISON:  CT 05/27/2012 FINDINGS: Lower chest: Lung bases demonstrate no acute consolidation or any fusion. Focal air trapping with focal emphysematous appearing lung in the right  middle lobe is unchanged. Normal heart size Hepatobiliary: No focal liver abnormality is seen. No gallstones, gallbladder wall thickening, or biliary dilatation. Pancreas: Unremarkable. No pancreatic ductal dilatation or surrounding inflammatory changes. Spleen: Normal in size without focal abnormality. Adrenals/Urinary Tract: Adrenal glands are within normal limits. Stable cysts in the left kidney. No hydronephrosis. Bladder unremarkable Stomach/Bowel: The stomach is nonenlarged. No evidence for a bowel obstruction. Multiple thickened loops of jejunal and ileal small bowel loops with hyperemia throughout the abdomen and pelvis. No intramural air. Vascular/Lymphatic: Mild aortic atherosclerosis. No aneurysmal dilatation. No significantly enlarged lymph nodes. Reproductive: Prostate is unremarkable. Other: No free air. Small amount of free fluid in the abdomen and pelvis. Musculoskeletal: Degenerative changes. No acute or suspicious finding IMPRESSION: 1. Multiple thickened hyperemic jejunal and ileal small bowel loops, suggesting enteritis; differential considerations include ischemia, inflammatory bowel disease, or infectious enteritis. No pneumatosis or portal venous gas is seen. The visible mesenteric vessels appear patent. Suggest correlation with lactic acid levels. 2. Small amount of free fluid within the abdomen and pelvis. No free air to suggest perforation. Electronically Signed   By: Donavan Foil  M.D.   On: 11/26/2017 00:07      Impression / Plan:   Isaiah Rangel is a 57 y.o. y/o male with abdominal pain on presentation yesterday, which has resolved completely at this time, with CT showing thickened hyperemic jejunal and ileal small bowel loops suggesting enteritis  Patient has no evidence of GI bleeding before or since presentation Enteritis may be related to his NSAID use or gastroenteritis from infectious causes NSAIDs He was asked to stay away from  Hemoglobin is normal at 14 No indication for endoscopy Collect stool and rule out infectious causes, with GI stool panel, and C. difficile testing No clinical signs and symptoms of ischemia present at this time.  No blood in stool.  Avoid hypotension.  IV fluids or p.o. nutrition is important this time.  He does not have any evidence of bowel obstruction at this time Diet can be advanced if okay with primary team  He is due for surveillance colonoscopy, and that is unrelated to this admission.  This can be done as an outpatient.  Patient can follow-up with his GI of choice for this.   Thank you for involving me in the care of this patient.      LOS: 0 days   Virgel Manifold, MD  11/26/2017, 12:52 PM

## 2017-11-26 NOTE — ED Notes (Signed)
Called to give report to floor. They said they will call back.

## 2017-11-26 NOTE — Progress Notes (Signed)
Initial Nutrition Assessment  DOCUMENTATION CODES:   Not applicable  INTERVENTION:   Carnation Instant Breakfast BID- each packet provides 130kcal and 5g protein   MVI daily   Bowel regimen per MD  NUTRITION DIAGNOSIS:   Inadequate oral intake related to acute illness as evidenced by per patient/family report  GOAL:   Patient will meet greater than or equal to 90% of their needs  MONITOR:   PO intake, Supplement acceptance, Labs, Weight trends  REASON FOR ASSESSMENT:   Malnutrition Screening Tool    ASSESSMENT:   57 y/o male with h/o COPD admitted with gastroenteritis   Met with pt in room today. Pt reports good appetite and oral intake at baseline. Pt reports poor oral intake for 1 day pta r/t abdominal pain, nausea, and vomiting. Pt reports that he is feeling better today. Pt was advanced to a regular diet today and reports that after he ate a few fries he started having abdominal pain again. Pt drinks vanilla carnation instant breakfast at home with whole milk; pt drinks 1 per day. Pt reports that he has lost from 150lbs down to 138lbs r/t the loss of his father last year. Pt reports that his weight has been stable around 138lbs fro several months. RD will order supplements. Pt with type 1 stool today; bowel regimen per MD.   Medications reviewed and include: ciprofloxacin, calace, heparin, metronidazole, MVI, NaCl '@75ml'$ /hr  Labs reviewed: Ca 8.3(L) Wbc- 26.8(H)  NUTRITION - FOCUSED PHYSICAL EXAM:    Most Recent Value  Orbital Region  No depletion  Upper Arm Region  Mild depletion  Thoracic and Lumbar Region  Mild depletion  Buccal Region  No depletion  Temple Region  Mild depletion  Clavicle Bone Region  Mild depletion  Clavicle and Acromion Bone Region  Mild depletion  Scapular Bone Region  No depletion  Dorsal Hand  No depletion  Patellar Region  No depletion  Anterior Thigh Region  No depletion  Posterior Calf Region  No depletion  Edema (RD Assessment)   None  Hair  Reviewed  Eyes  Reviewed  Mouth  Reviewed  Skin  Reviewed  Nails  Reviewed      Diet Order:  DIET FINGER FOODS Room service appropriate? Yes; Fluid consistency: Thin  EDUCATION NEEDS:   Education needs have been addressed  Skin:  Skin Assessment: Reviewed RN Assessment  Last BM:  3/29- type 1  Height:   Ht Readings from Last 1 Encounters:  11/25/17 '5\' 5"'$  (1.651 m)    Weight:   Wt Readings from Last 1 Encounters:  11/26/17 139 lb 8 oz (63.3 kg)    Ideal Body Weight:  61.8 kg  BMI:  Body mass index is 23.21 kg/m.  Estimated Nutritional Needs:   Kcal:  1700-2000kcal/day   Protein:  70-82g/day   Fluid:  >1.6L/day   Koleen Distance MS, RD, LDN Pager #[phone] After Hours Pager: [phone]

## 2017-11-26 NOTE — Plan of Care (Signed)
  Problem: Education: Goal: Knowledge of General Education information will improve Outcome: Progressing   Problem: Health Behavior/Discharge Planning: Goal: Ability to manage health-related needs will improve Outcome: Progressing   Problem: Clinical Measurements: Goal: Ability to maintain clinical measurements within normal limits will improve Outcome: Progressing Goal: Will remain free from infection Outcome: Progressing Note:  Surgery  saw and  told pt no surgery Goal: Diagnostic test results will improve Outcome: Progressing Goal: Respiratory complications will improve Outcome: Progressing Goal: Cardiovascular complication will be avoided Outcome: Progressing   Problem: Activity: Goal: Risk for activity intolerance will decrease Outcome: Progressing   Problem: Nutrition: Goal: Adequate nutrition will be maintained Outcome: Progressing   Problem: Coping: Goal: Level of anxiety will decrease Outcome: Progressing Note:  On lexapro   Problem: Elimination: Goal: Will not experience complications related to bowel motility Outcome: Progressing Goal: Will not experience complications related to urinary retention Outcome: Progressing   Problem: Pain Managment: Goal: General experience of comfort will improve Outcome: Progressing   Problem: Safety: Goal: Ability to remain free from injury will improve Outcome: Progressing   Problem: Skin Integrity: Goal: Risk for impaired skin integrity will decrease Outcome: Progressing

## 2017-11-26 NOTE — Consult Note (Signed)
Surgical Consultation  11/26/2017  Isaiah Rangel is an 57 y.o. male.   Referring Physician: Prime doc  CC nausea vomiting and abdominal pain  HPI: This patient admitted through the emergency room yesterday with nausea vomiting and abdominal pain came on acutely an hour after eating a regular diet that was prepared at home.  No one else at home was ill and no one else experience the symptoms that he had.  He is never had an episode like this before he did not have diarrhea no melena no hematochezia.  He has had an EGD and colonoscopy 2 or 3 years ago.  He had a workup for coronary artery disease and is being treated for that.  He is a smoker and works in [employer]. The patient's symptoms have completely resolved.  This morning he has no nausea vomiting or abdominal pain.  Past Medical History:  Diagnosis Date  . Anxiety   . BPH (benign prostatic hyperplasia)   . Current smoker   . GERD (gastroesophageal reflux disease)   . OA (osteoarthritis)     Past Surgical History:  Procedure Laterality Date  . BACK SURGERY    . CARDIAC CATHETERIZATION  04/06/13   [facility]- minor luminal irregularities, otherwise normal cors; EF > 55%. Medical management and smoking cessation recommended  . KNEE ARTHROSCOPY W/ PARTIAL MEDIAL MENISCECTOMY Right 2012   [place], 2012  . NASAL SINUS SURGERY    . neck fusion    . VASECTOMY      Family History  Problem Relation Age of Onset  . Arthritis Father   . Coronary artery disease Father   . Hypertension Father   . CVA Father 23  . Colon cancer Mother   . Heart attack Brother   . Heart attack Brother   . Coronary artery disease Brother 91  . Esophageal cancer Neg Hx   . Stomach cancer Neg Hx     Social History:  reports that he has been smoking cigarettes.  He has a 15.00 pack-year smoking history. He has never used smokeless tobacco. He reports that he does not drink alcohol or use drugs.  Allergies: No Known Allergies  Medications reviewed.   Review  of Systems:   Review of Systems  Constitutional: Negative.   HENT: Negative.   Eyes: Negative.   Respiratory: Negative.   Cardiovascular: Negative.   Gastrointestinal: Positive for abdominal pain, nausea and vomiting. Negative for blood in stool, constipation, diarrhea, heartburn and melena.  Genitourinary: Negative.   Musculoskeletal: Negative.   Skin: Negative.   Neurological: Negative.   Endo/Heme/Allergies: Negative.   Psychiatric/Behavioral: Negative.      Physical Exam:  BP 104/62   Pulse 68   Temp 98.7 F (37.1 C) (Oral)   Resp 20   Ht 5' 5" (1.651 m)   Wt 139 lb 8 oz (63.3 kg)   SpO2 90%   BMI 23.21 kg/m   Physical Exam  Constitutional: He is oriented to person, place, and time and well-developed, well-nourished, and in no distress.  HENT:  Head: Normocephalic and atraumatic.  Eyes: Pupils are equal, round, and reactive to light. Right eye exhibits no discharge. Left eye exhibits no discharge. No scleral icterus.  Abdominal: Soft. He exhibits no distension. There is no tenderness. There is no rebound and no guarding.  Musculoskeletal: Normal range of motion. He exhibits no edema or tenderness.  Neurological: He is alert and oriented to person, place, and time.  Skin: Skin is warm and dry. No rash noted.  No erythema.  Psychiatric: Mood and affect normal.  Vitals reviewed.     Results for orders placed or performed during the hospital encounter of 11/25/17 (from the past 48 hour(s))  Lipase, blood     Status: None   Collection Time: 11/25/17 10:59 PM  Result Value Ref Range   Lipase 28 11 - 51 U/L    Comment: Performed at [facility], [address] [place], [place] [address]  Comprehensive metabolic panel     Status: Abnormal   Collection Time: 11/25/17 10:59 PM  Result Value Ref Range   Sodium 140 135 - 145 mmol/L   Potassium 3.9 3.5 - 5.1 mmol/L   Chloride 104 101 - 111 mmol/L   CO2 26 22 - 32 mmol/L   Glucose, Bld 130 (H) 65 - 99 mg/dL    BUN 17 6 - 20 mg/dL   Creatinine, Ser 1.00 0.61 - 1.24 mg/dL   Calcium 9.2 8.9 - 10.3 mg/dL   Total Protein 7.4 6.5 - 8.1 g/dL   Albumin 4.3 3.5 - 5.0 g/dL   AST 24 15 - 41 U/L   ALT 17 17 - 63 U/L   Alkaline Phosphatase 74 38 - 126 U/L   Total Bilirubin 0.6 0.3 - 1.2 mg/dL   GFR calc non Af Amer >60 >60 mL/min   GFR calc Af Amer >60 >60 mL/min    Comment: (NOTE) The eGFR has been calculated using the CKD EPI equation. This calculation has not been validated in all clinical situations. eGFR's persistently <60 mL/min signify possible Chronic Kidney Disease.    Anion gap 10 5 - 15    Comment: Performed at [facility], [address] [place], [place] [address]  CBC     Status: Abnormal   Collection Time: 11/25/17 10:59 PM  Result Value Ref Range   WBC 27.9 (H) 3.8 - 10.6 K/uL   RBC 5.31 4.40 - 5.90 MIL/uL   Hemoglobin 16.5 13.0 - 18.0 g/dL   HCT 50.0 40.0 - 52.0 %   MCV 94.3 80.0 - 100.0 fL   MCH 31.0 26.0 - 34.0 pg   MCHC 32.9 32.0 - 36.0 g/dL   RDW 15.7 (H) 11.5 - 14.5 %   Platelets 248 150 - 440 K/uL    Comment: Performed at [facility], [address] [place], [place] [address]  Lactic acid, plasma     Status: None   Collection Time: 11/26/17 12:38 AM  Result Value Ref Range   Lactic Acid, Venous 1.5 0.5 - 1.9 mmol/L    Comment: Performed at [facility], [address] [place], [place] [address]  Blood Culture (routine x 2)     Status: None (Preliminary result)   Collection Time: 11/26/17 12:38 AM  Result Value Ref Range   Specimen Description BLOOD RT FOREARM    Special Requests      BOTTLES DRAWN AEROBIC AND ANAEROBIC Blood Culture adequate volume   Culture      NO GROWTH < 12 HOURS Performed at [facility], [address], [place], [place] [address]    Report Status PENDING   Blood Culture (routine x 2)     Status: None (Preliminary result)   Collection Time: 11/26/17 12:38 AM  Result Value Ref Range   Specimen  Description BLOOD LT AC    Special Requests      BOTTLES DRAWN AEROBIC AND ANAEROBIC Blood Culture results may not be optimal due to an excessive volume of  blood received in culture bottles   Culture      NO GROWTH < 12 HOURS Performed at [facility], [address] [place], [place] [address]    Report Status PENDING   Lactic acid, plasma     Status: None   Collection Time: 11/26/17  3:16 AM  Result Value Ref Range   Lactic Acid, Venous 1.1 0.5 - 1.9 mmol/L    Comment: Performed at [facility], [address] [place], [place] [address]  Basic metabolic panel     Status: Abnormal   Collection Time: 11/26/17  3:16 AM  Result Value Ref Range   Sodium 140 135 - 145 mmol/L   Potassium 4.1 3.5 - 5.1 mmol/L   Chloride 106 101 - 111 mmol/L   CO2 25 22 - 32 mmol/L   Glucose, Bld 130 (H) 65 - 99 mg/dL   BUN 17 6 - 20 mg/dL   Creatinine, Ser 0.96 0.61 - 1.24 mg/dL   Calcium 8.3 (L) 8.9 - 10.3 mg/dL   GFR calc non Af Amer >60 >60 mL/min   GFR calc Af Amer >60 >60 mL/min    Comment: (NOTE) The eGFR has been calculated using the CKD EPI equation. This calculation has not been validated in all clinical situations. eGFR's persistently <60 mL/min signify possible Chronic Kidney Disease.    Anion gap 9 5 - 15    Comment: Performed at [facility], [address] [place], [place] [address]  CBC     Status: Abnormal   Collection Time: 11/26/17  3:16 AM  Result Value Ref Range   WBC 26.8 (H) 3.8 - 10.6 K/uL   RBC 4.50 4.40 - 5.90 MIL/uL   Hemoglobin 14.1 13.0 - 18.0 g/dL   HCT 41.9 40.0 - 52.0 %   MCV 93.2 80.0 - 100.0 fL   MCH 31.3 26.0 - 34.0 pg   MCHC 33.6 32.0 - 36.0 g/dL   RDW 15.5 (H) 11.5 - 14.5 %   Platelets 220 150 - 440 K/uL    Comment: Performed at [facility], [address] [place], [place] [address]  Glucose, capillary     Status: Abnormal   Collection Time: 11/26/17  7:45 AM  Result Value Ref Range   Glucose-Capillary 109 (H)  65 - 99 mg/dL  Urinalysis, Complete w Microscopic     Status: Abnormal   Collection Time: 11/26/17  8:26 AM  Result Value Ref Range   Color, Urine YELLOW (A) YELLOW   APPearance CLEAR (A) CLEAR   Specific Gravity, Urine 1.040 (H) 1.005 - 1.030   pH 6.0 5.0 - 8.0   Glucose, UA NEGATIVE NEGATIVE mg/dL   Hgb urine dipstick NEGATIVE NEGATIVE   Bilirubin Urine NEGATIVE NEGATIVE   Ketones, ur NEGATIVE NEGATIVE mg/dL   Protein, ur NEGATIVE NEGATIVE mg/dL   Nitrite NEGATIVE NEGATIVE   Leukocytes, UA NEGATIVE NEGATIVE   RBC / HPF 6-30 0 - 5 RBC/hpf   WBC, UA 0-5 0 - 5 WBC/hpf   Bacteria, UA NONE SEEN NONE SEEN   Squamous Epithelial / LPF 0-5 (A) NONE SEEN    Comment: Performed at [facility], [address] [place], [place] [address]   Ct Abdomen Pelvis W Contrast  Result Date: 11/26/2017 CLINICAL DATA:  Abdominal pain EXAM: CT ABDOMEN AND PELVIS WITH CONTRAST TECHNIQUE: Multidetector CT imaging of the abdomen and pelvis was performed using the standard protocol following bolus administration of intravenous contrast. CONTRAST:  161m ISOVUE-300 IOPAMIDOL (ISOVUE-300)  INJECTION 61% COMPARISON:  CT 05/27/2012 FINDINGS: Lower chest: Lung bases demonstrate no acute consolidation or any fusion. Focal air trapping with focal emphysematous appearing lung in the right middle lobe is unchanged. Normal heart size Hepatobiliary: No focal liver abnormality is seen. No gallstones, gallbladder wall thickening, or biliary dilatation. Pancreas: Unremarkable. No pancreatic ductal dilatation or surrounding inflammatory changes. Spleen: Normal in size without focal abnormality. Adrenals/Urinary Tract: Adrenal glands are within normal limits. Stable cysts in the left kidney. No hydronephrosis. Bladder unremarkable Stomach/Bowel: The stomach is nonenlarged. No evidence for a bowel obstruction. Multiple thickened loops of jejunal and ileal small bowel loops with hyperemia throughout the abdomen and pelvis. No  intramural air. Vascular/Lymphatic: Mild aortic atherosclerosis. No aneurysmal dilatation. No significantly enlarged lymph nodes. Reproductive: Prostate is unremarkable. Other: No free air. Small amount of free fluid in the abdomen and pelvis. Musculoskeletal: Degenerative changes. No acute or suspicious finding IMPRESSION: 1. Multiple thickened hyperemic jejunal and ileal small bowel loops, suggesting enteritis; differential considerations include ischemia, inflammatory bowel disease, or infectious enteritis. No pneumatosis or portal venous gas is seen. The visible mesenteric vessels appear patent. Suggest correlation with lactic acid levels. 2. Small amount of free fluid within the abdomen and pelvis. No free air to suggest perforation. Electronically Signed   By: Donavan Foil M.D.   On: 11/26/2017 00:07    Assessment/Plan:  CT scan and labs are personally reviewed.  Patient's history is reviewed.  Patient describes nausea and vomiting without diarrhea.  This followed a home prepared meal were no one else became ill.  The CT scan suggests thickened small bowel but I see no sign of atherosclerotic disease near the SMA or celiac.  He does have known atherosclerotic disease but it is appearing to be minimal.  I doubt that this is ischemic in nature.  I would not recommend any surgical intervention at this time.  He should follow-up with his primary care physician.  We will be available as needed.  Florene Glen, MD, FACS

## 2017-11-26 NOTE — Progress Notes (Signed)
Patient is feeling much improved.  Diarrhea is improved.  No abdominal pain.  Wants to try regular food.  No vomiting.  Afebrile.  He does have significantly elevated WBC.  We will start him on oral ciprofloxacin and Flagyl.  Likely discharge tomorrow if doing better.  Surgery and GI have seen and no change in plan according to them.

## 2017-11-26 NOTE — H&P (Signed)
[place] [facility] [place] NAME: Isaiah Rangel    MR#:  [id number]  DATE OF BIRTH:  1960-10-21  DATE OF ADMISSION:  11/25/2017  PRIMARY CARE PHYSICIAN: Owens Loffler, MD   REQUESTING/REFERRING PHYSICIAN:   CHIEF COMPLAINT:   Chief Complaint  Patient presents with  . Abdominal Pain    HISTORY OF PRESENT ILLNESS: Isaiah Rangel  is a 57 y.o. male with a known history of osteoarthritis, tobacco abuse and BPH. Patient presented to emergency room for acute onset of severe mid lower abdominal pain that started approximately 2-hours before arriving to the emergency room.  The abdominal pain is described as 10 out of 10 in severity, constant and throbbing in nature, without any radiation.  He does get worse with movement and improves if he lays still.  Patient admits to mild occasional nausea, since the pain started.  No fever/chills, no vomiting/diarrhea.  No new medications.  No recent trauma. Blood test in emergency room are remarkable for elevated WBC at 28,000. Abdominal CAT scan, reviewed by myself, shows multiple thickened hyperemic jejunal and ileal small bowel loops, suggesting acute enteritis. Patient is admitted for further evaluation and treatment.  PAST MEDICAL HISTORY:   Past Medical History:  Diagnosis Date  . Anxiety   . BPH (benign prostatic hyperplasia)   . Current smoker   . GERD (gastroesophageal reflux disease)   . OA (osteoarthritis)     PAST SURGICAL HISTORY:  Past Surgical History:  Procedure Laterality Date  . BACK SURGERY    . CARDIAC CATHETERIZATION  04/06/13   [facility]- minor luminal irregularities, otherwise normal cors; EF > 55%. Medical management and smoking cessation recommended  . KNEE ARTHROSCOPY W/ PARTIAL MEDIAL MENISCECTOMY Right 2012   [place], 2012  . NASAL SINUS SURGERY    . neck fusion    . VASECTOMY      SOCIAL HISTORY:  Social History   Tobacco Use  . Smoking status: Current Every Day Smoker     Packs/day: 0.50    Years: 30.00    Pack years: 15.00    Types: Cigarettes  . Smokeless tobacco: Never Used  . Tobacco comment: he is aware he needs to quit   Substance Use Topics  . Alcohol use: No    Alcohol/week: 0.0 oz    FAMILY HISTORY:  Family History  Problem Relation Age of Onset  . Arthritis Father   . Coronary artery disease Father   . Hypertension Father   . CVA Father 67  . Colon cancer Mother   . Heart attack Brother   . Heart attack Brother   . Coronary artery disease Brother 28  . Esophageal cancer Neg Hx   . Stomach cancer Neg Hx     DRUG ALLERGIES: No Known Allergies  REVIEW OF SYSTEMS:   CONSTITUTIONAL: No fever, fatigue or weakness.  EYES: No blurred or double vision.  EARS, NOSE, AND THROAT: No tinnitus or ear pain.  RESPIRATORY: No cough, shortness of breath, wheezing or hemoptysis.  CARDIOVASCULAR: No chest pain, orthopnea, edema.  GASTROINTESTINAL: Positive for severe mid lower abdominal pain associated with nausea.  No vomiting, diarrhea.  GENITOURINARY: No dysuria, hematuria.  ENDOCRINE: No polyuria, nocturia,  HEMATOLOGY: No anemia, easy bruising or bleeding SKIN: No rash or lesion. MUSCULOSKELETAL: No joint pain or arthritis.   NEUROLOGIC: No focal weakness.  PSYCHIATRY: No anxiety or depression.   MEDICATIONS AT HOME:  Prior to Admission medications   Medication Sig Start Date End  Date Taking? Authorizing Provider  acetaminophen (TYLENOL) 500 MG tablet Take 1,000 mg by mouth every 6 (six) hours as needed.     Yes [provider]  albuterol (PROVENTIL HFA;VENTOLIN HFA) 108 (90 Base) MCG/ACT inhaler Inhale 2 puffs into the lungs every 4 (four) hours as needed for wheezing. 09/29/16  Yes Tower, Wynelle Fanny, MD  aspirin EC 81 MG tablet Take 81 mg by mouth daily.   Yes [provider]  isosorbide mononitrate (IMDUR) 30 MG 24 hr tablet TAKE 1 TABLET BY MOUTH  DAILY 08/20/17  Yes Gollan, Kathlene November, MD  Multiple Vitamin  (MULTIVITAMIN) tablet Take 1 tablet by mouth daily.     Yes [provider]  nitroGLYCERIN (NITROSTAT) 0.4 MG SL tablet PLACE 1 TABLET UNDER THE TONGUE EVERY 5 MINUTES AS NEEDED FOR CHEST PAIN 01/22/17  Yes Gollan, Kathlene November, MD  Omega-3 Fatty Acids (FISH OIL) 1000 MG CAPS Take 1 capsule by mouth daily.   Yes [provider]  escitalopram (LEXAPRO) 10 MG tablet TAKE 1 TABLET(10 MG) BY MOUTH DAILY Patient not taking: Reported on 11/26/2017 09/01/17   [name], [name], MD  hyoscyamine (LEVSIN SL) 0.125 MG SL tablet PLACE 1 TABLET UNDER THE TONGUE AS NEEDED FOR CHEST PAIN OR SPASMS Patient not taking: Reported on 11/26/2017 10/24/15   Esterwood, Amy S, PA-C  nortriptyline (PAMELOR) 25 MG capsule TAKE 1 CAPSULE(25 MG) BY MOUTH AT BEDTIME Patient not taking: Reported on 11/26/2017 03/04/16   [name], [name], MD  oseltamivir (TAMIFLU) 75 MG capsule Take 1 capsule (75 mg total) by mouth 2 (two) times daily. Patient not taking: Reported on 11/26/2017 10/21/17   Owens Loffler, MD      PHYSICAL EXAMINATION:   VITAL SIGNS: Blood pressure 126/83, pulse 83, temperature 98.7 F (37.1 C), temperature source Oral, resp. rate 15, height 5\' 5"  (1.651 m), weight 62.1 kg (137 lb), SpO2 98 %.  GENERAL:  57 y.o.-year-old patient lying in the bed with no acute distress.  EYES: Pupils equal, round, reactive to light and accommodation. No scleral icterus. HEENT: Head atraumatic, normocephalic. Oropharynx and nasopharynx clear.  NECK:  Supple, no jugular venous distention. No thyroid enlargement, no tenderness.  LUNGS: Reduced breath sounds bilaterally, no wheezing, rales,rhonchi or crepitation. No use of accessory muscles of respiration.  CARDIOVASCULAR: S1, S2 normal. No murmurs, rubs, or gallops.  ABDOMEN: There is tenderness with palpation at the mid lower abdominal area; no guarding no rebound.  Otherwise, the abdomen is soft, nondistended. Bowel sounds present. No organomegaly or mass.  EXTREMITIES:  No pedal edema, cyanosis, or clubbing.  NEUROLOGIC: No focal weakness.  Gait not checked, due to patient being in severe pain.  PSYCHIATRIC: The patient is alert and oriented x 3.  SKIN: No obvious rash, lesion, or ulcer.   LABORATORY PANEL:   CBC Recent Labs  Lab 11/25/17 2259 11/26/17 0316  WBC 27.9* 26.8*  HGB 16.5 14.1  HCT 50.0 41.9  PLT 248 220  MCV 94.3 93.2  MCH 31.0 31.3  MCHC 32.9 33.6  RDW 15.7* 15.5*   ------------------------------------------------------------------------------------------------------------------  Chemistries  Recent Labs  Lab 11/25/17 2259 11/26/17 0316  NA 140 140  K 3.9 4.1  CL 104 106  CO2 26 25  GLUCOSE 130* 130*  BUN 17 17  CREATININE 1.00 0.96  CALCIUM 9.2 8.3*  AST 24  --   ALT 17  --   ALKPHOS 74  --   BILITOT 0.6  --    ------------------------------------------------------------------------------------------------------------------ estimated creatinine clearance is 73.8  mL/min (by C-G formula based on SCr of 0.96 mg/dL). ------------------------------------------------------------------------------------------------------------------ No results for input(s): TSH, T4TOTAL, T3FREE, THYROIDAB in the last 72 hours.  Invalid input(s): FREET3   Coagulation profile No results for input(s): INR, PROTIME in the last 168 hours. ------------------------------------------------------------------------------------------------------------------- No results for input(s): DDIMER in the last 72 hours. -------------------------------------------------------------------------------------------------------------------  Cardiac Enzymes No results for input(s): CKMB, TROPONINI, MYOGLOBIN in the last 168 hours.  Invalid input(s): CK ------------------------------------------------------------------------------------------------------------------ Invalid input(s):  POCBNP  ---------------------------------------------------------------------------------------------------------------  Urinalysis    Component Value Date/Time   COLORURINE yellow 07/09/2010 1144   APPEARANCEUR Clear 07/09/2010 1144   LABSPEC 1.025 07/09/2010 1144   PHURINE 6.0 07/09/2010 1144   HGBUR moderate 07/09/2010 1144   BILIRUBINUR negative 07/09/2010 1144   UROBILINOGEN 0.2 07/09/2010 1144   NITRITE negative 07/09/2010 1144     RADIOLOGY: Ct Abdomen Pelvis W Contrast  Result Date: 11/26/2017 CLINICAL DATA:  Abdominal pain EXAM: CT ABDOMEN AND PELVIS WITH CONTRAST TECHNIQUE: Multidetector CT imaging of the abdomen and pelvis was performed using the standard protocol following bolus administration of intravenous contrast. CONTRAST:  179mL ISOVUE-300 IOPAMIDOL (ISOVUE-300) INJECTION 61% COMPARISON:  CT 05/27/2012 FINDINGS: Lower chest: Lung bases demonstrate no acute consolidation or any fusion. Focal air trapping with focal emphysematous appearing lung in the right middle lobe is unchanged. Normal heart size Hepatobiliary: No focal liver abnormality is seen. No gallstones, gallbladder wall thickening, or biliary dilatation. Pancreas: Unremarkable. No pancreatic ductal dilatation or surrounding inflammatory changes. Spleen: Normal in size without focal abnormality. Adrenals/Urinary Tract: Adrenal glands are within normal limits. Stable cysts in the left kidney. No hydronephrosis. Bladder unremarkable Stomach/Bowel: The stomach is nonenlarged. No evidence for a bowel obstruction. Multiple thickened loops of jejunal and ileal small bowel loops with hyperemia throughout the abdomen and pelvis. No intramural air. Vascular/Lymphatic: Mild aortic atherosclerosis. No aneurysmal dilatation. No significantly enlarged lymph nodes. Reproductive: Prostate is unremarkable. Other: No free air. Small amount of free fluid in the abdomen and pelvis. Musculoskeletal: Degenerative changes. No acute or  suspicious finding IMPRESSION: 1. Multiple thickened hyperemic jejunal and ileal small bowel loops, suggesting enteritis; differential considerations include ischemia, inflammatory bowel disease, or infectious enteritis. No pneumatosis or portal venous gas is seen. The visible mesenteric vessels appear patent. Suggest correlation with lactic acid levels. 2. Small amount of free fluid within the abdomen and pelvis. No free air to suggest perforation. Electronically Signed   By: Donavan Foil M.D.   On: 11/26/2017 00:07    EKG: Orders placed or performed in visit on 04/29/17  . EKG 12-Lead    IMPRESSION AND PLAN:  1.  Acute enteritis, of unclear etiology.  Differential considerations include ischemia, inflammatory bowel disease or infectious enteritis.  Will start patient on IV antibiotics, ceftriaxone and Flagyl.  We will consult general surgery and gastroenterology for further evaluation and treatment.  Will keep patient n.p.o. except for medications and ice chips for now. 2.  Tobacco abuse.  Smoking cessation was discussed with patient in detail.  3.  COPD, stable, continue home maintenance medication.   All the records are reviewed and case discussed with ED provider. Management plans discussed with the patient, family and they are in agreement.  CODE STATUS:    Code Status Orders  (From admission, onward)        Start     Ordered   11/26/17 0256  Full code  Continuous     11/26/17 0255    Code Status History    This patient has a current code  status but no historical code status.       TOTAL TIME TAKING CARE OF THIS PATIENT: 40 minutes.    Amelia Jo M.D on 11/26/2017 at 4:04 AM  Between 7am to 6pm - Pager - [phone]  After 6pm go to [email or web address] - password [email or web address] [facility]  Office  [phone]  CC: Primary care physician; Owens Loffler, MD

## 2017-11-27 DIAGNOSIS — R7881 Bacteremia: Secondary | ICD-10-CM | POA: Diagnosis not present

## 2017-11-27 DIAGNOSIS — J449 Chronic obstructive pulmonary disease, unspecified: Secondary | ICD-10-CM | POA: Diagnosis not present

## 2017-11-27 DIAGNOSIS — K55019 Acute (reversible) ischemia of small intestine, extent unspecified: Secondary | ICD-10-CM | POA: Diagnosis not present

## 2017-11-27 LAB — BLOOD CULTURE ID PANEL (REFLEXED)

## 2017-11-27 LAB — CBC WITH DIFFERENTIAL/PLATELET
Basophils Absolute: 0.1 10*3/uL (ref 0–0.1)
Basophils Relative: 1 %
Eosinophils Absolute: 0.5 10*3/uL (ref 0–0.7)
Eosinophils Relative: 5 %
HCT: 37.9 % — ABNORMAL LOW (ref 40.0–52.0)
Hemoglobin: 12.5 g/dL — ABNORMAL LOW (ref 13.0–18.0)
Lymphocytes Relative: 42 %
Lymphs Abs: 4.4 10*3/uL — ABNORMAL HIGH (ref 1.0–3.6)
MCH: 31.1 pg (ref 26.0–34.0)
MCHC: 33.1 g/dL (ref 32.0–36.0)
MCV: 94 fL (ref 80.0–100.0)
Monocytes Absolute: 0.7 10*3/uL (ref 0.2–1.0)
Monocytes Relative: 7 %
Neutro Abs: 4.7 10*3/uL (ref 1.4–6.5)
Neutrophils Relative %: 45 %
Platelets: 188 10*3/uL (ref 150–440)
RBC: 4.03 MIL/uL — ABNORMAL LOW (ref 4.40–5.90)
RDW: 15.8 % — ABNORMAL HIGH (ref 11.5–14.5)
WBC: 10.3 10*3/uL (ref 3.8–10.6)

## 2017-11-27 LAB — HIV ANTIBODY (ROUTINE TESTING W REFLEX): HIV Screen 4th Generation wRfx: NONREACTIVE

## 2017-11-27 MED ORDER — CIPROFLOXACIN HCL 500 MG PO TABS: 500.0000 mg | ORAL_TABLET | Freq: Two times a day (BID) | ORAL | 0 refills | Status: AC

## 2017-11-27 MED ORDER — ESCITALOPRAM OXALATE 10 MG PO TABS: 10.0000 mg | ORAL_TABLET | Freq: Every day | ORAL | 0 refills | Status: DC

## 2017-11-27 MED ORDER — METRONIDAZOLE 500 MG PO TABS: 500.0000 mg | ORAL_TABLET | Freq: Three times a day (TID) | ORAL | 0 refills | Status: AC

## 2017-11-27 NOTE — Discharge Planning (Signed)
Patient IV removed.  RN assessment and VS revealed stability for DC to home.  Discharge papers given, explained and educated.  Informed of suggested FU appt and patient agreed to contact to sep up (since currently office is not open).  Printed scripts and also sent 1 via e-scribed [place].  Once ready, will be walked to front and family transporting home via car.

## 2017-11-27 NOTE — Progress Notes (Addendum)
PHARMACY - PHYSICIAN COMMUNICATION CRITICAL VALUE ALERT - BLOOD CULTURE IDENTIFICATION (BCID)  Isaiah Rangel is an 57 y.o. male who presented to [facility] on 11/25/2017 with a chief complaint of abdominal pain  Assessment:  Enteritis, WBC 27.9 >> 26.8, CT abdomen shows enteritis  Name of physician (or Provider) Contacted: Amelia Jo  Current antibiotics: Amelia Jo  Changes to prescribed antibiotics recommended:  Patient is on recommended antibiotics - No changes needed -- probable contamination  Results for orders placed or performed during the hospital encounter of 11/25/17  Blood Culture ID Panel (Reflexed) (Collected: 11/26/2017 12:38 AM)  Result Value Ref Range   Enterococcus species NOT DETECTED NOT DETECTED   Listeria monocytogenes NOT DETECTED NOT DETECTED   Staphylococcus species NOT DETECTED NOT DETECTED   Staphylococcus aureus NOT DETECTED NOT DETECTED   Streptococcus species DETECTED (A) NOT DETECTED   Streptococcus agalactiae NOT DETECTED NOT DETECTED   Streptococcus pneumoniae NOT DETECTED NOT DETECTED   Streptococcus pyogenes NOT DETECTED NOT DETECTED   Acinetobacter baumannii NOT DETECTED NOT DETECTED   Enterobacteriaceae species NOT DETECTED NOT DETECTED   Enterobacter cloacae complex NOT DETECTED NOT DETECTED   Escherichia coli NOT DETECTED NOT DETECTED   Klebsiella oxytoca NOT DETECTED NOT DETECTED   Klebsiella pneumoniae NOT DETECTED NOT DETECTED   Proteus species NOT DETECTED NOT DETECTED   Serratia marcescens NOT DETECTED NOT DETECTED   Haemophilus influenzae NOT DETECTED NOT DETECTED   Neisseria meningitidis NOT DETECTED NOT DETECTED   Pseudomonas aeruginosa NOT DETECTED NOT DETECTED   Candida albicans NOT DETECTED NOT DETECTED   Candida glabrata NOT DETECTED NOT DETECTED   Candida krusei NOT DETECTED NOT DETECTED   Candida parapsilosis NOT DETECTED NOT DETECTED   Candida tropicalis NOT DETECTED NOT DETECTED   Tobie Lords, PharmD,  BCPS Clinical Pharmacist 11/27/2017

## 2017-11-27 NOTE — Discharge Summary (Signed)
[place] [facility] [place] NAME: Isaiah Rangel    MR#:  [id number]  DATE OF BIRTH:  1961/04/30  DATE OF ADMISSION:  11/25/2017 ADMITTING PHYSICIAN: Amelia Jo, MD  DATE OF DISCHARGE: November 27, 2017  PRIMARY CARE PHYSICIAN: Owens Loffler, MD    ADMISSION DIAGNOSIS:  Enteritis [K52.9] Peritonitis ([place]) [K65.9]  DISCHARGE DIAGNOSIS:  Active Problems:   Acute ischemic enteritis ([place])   Enteritis   SECONDARY DIAGNOSIS:   Past Medical History:  Diagnosis Date  . Anxiety   . BPH (benign prostatic hyperplasia)   . Current smoker   . GERD (gastroesophageal reflux disease)   . OA (osteoarthritis)     HOSPITAL COURSE:   57 year old male with osteoarthritis who presents with abdominal pain.  1.  Acute enteritis which is etiology abdominal pain: Patient's abdominal pain has subsided.  Patient had no diarrhea, nausea or vomiting while in the hospital.  He is tolerating his diet.  He had no fevers in the hospital.  He will continue with ciprofloxacin and Flagyl.  Patient was evaluated by GI and surgical services who recommended continue antibiotics.  2.  1 out of 2 Streptococcus species bacteremia: This is a contaminant  3.  Leukocytosis: This is improved  4.Tobacco dependence: Patient is encouraged to quit smoking. Counseling was provided for 4 minutes. He does not want a nicotine patch at discharge.  5.  COPD: No signs of exacerbation  DISCHARGE CONDITIONS AND DIET:   Stable for discharge on regular diet  CONSULTS OBTAINED:  Treatment Team:  Florene Glen, MD  DRUG ALLERGIES:  No Known Allergies  DISCHARGE MEDICATIONS:   Allergies as of 11/27/2017   No Known Allergies     Medication List    STOP taking these medications   hyoscyamine 0.125 MG SL tablet Commonly known as:  LEVSIN SL   nortriptyline 25 MG capsule Commonly known as:  PAMELOR   oseltamivir 75 MG capsule Commonly known as:  TAMIFLU     TAKE these  medications   acetaminophen 500 MG tablet Commonly known as:  TYLENOL Take 1,000 mg by mouth every 6 (six) hours as needed.   albuterol 108 (90 Base) MCG/ACT inhaler Commonly known as:  PROVENTIL HFA;VENTOLIN HFA Inhale 2 puffs into the lungs every 4 (four) hours as needed for wheezing.   aspirin EC 81 MG tablet Take 81 mg by mouth daily.   ciprofloxacin 500 MG tablet Commonly known as:  CIPRO Take 1 tablet (500 mg total) by mouth 2 (two) times daily for 6 days.   escitalopram 10 MG tablet Commonly known as:  LEXAPRO Take 1 tablet (10 mg total) by mouth daily. What changed:  See the new instructions.   Fish Oil 1000 MG Caps Take 1 capsule by mouth daily.   isosorbide mononitrate 30 MG 24 hr tablet Commonly known as:  IMDUR TAKE 1 TABLET BY MOUTH  DAILY   metroNIDAZOLE 500 MG tablet Commonly known as:  FLAGYL Take 1 tablet (500 mg total) by mouth every 8 (eight) hours for 6 days.   multivitamin tablet Take 1 tablet by mouth daily.   nitroGLYCERIN 0.4 MG SL tablet Commonly known as:  NITROSTAT PLACE 1 TABLET UNDER THE TONGUE EVERY 5 MINUTES AS NEEDED FOR CHEST PAIN         Today   CHIEF COMPLAINT:  She doing well this morning.  No abdominal pain, nausea or vomiting.   VITAL SIGNS:  Blood pressure 100/70, pulse 67, temperature 98.2  F (36.8 C), temperature source Oral, resp. rate 18, height 5\' 5"  (8.563 m), weight 64.4 kg (141 lb 14.4 oz), SpO2 92 %.   REVIEW OF SYSTEMS:  Review of Systems  Constitutional: Negative.  Negative for chills, fever and malaise/fatigue.  HENT: Negative.  Negative for ear discharge, ear pain, hearing loss, nosebleeds and sore throat.   Eyes: Negative.  Negative for blurred vision and pain.  Respiratory: Negative.  Negative for cough, hemoptysis, shortness of breath and wheezing.   Cardiovascular: Negative.  Negative for chest pain, palpitations and leg swelling.  Gastrointestinal: Negative.  Negative for abdominal pain, blood in  stool, diarrhea, nausea and vomiting.  Genitourinary: Negative.  Negative for dysuria.  Musculoskeletal: Negative.  Negative for back pain.  Skin: Negative.   Neurological: Negative for dizziness, tremors, speech change, focal weakness, seizures and headaches.  Endo/Heme/Allergies: Negative.  Does not bruise/bleed easily.  Psychiatric/Behavioral: Negative.  Negative for depression, hallucinations and suicidal ideas.     PHYSICAL EXAMINATION:  GENERAL:  57 y.o.-year-old patient lying in the bed with no acute distress.  NECK:  Supple, no jugular venous distention. No thyroid enlargement, no tenderness.  LUNGS: Normal breath sounds bilaterally, no wheezing, rales,rhonchi  No use of accessory muscles of respiration.  CARDIOVASCULAR: S1, S2 normal. No murmurs, rubs, or gallops.  ABDOMEN: Soft, non-tender, non-distended. Bowel sounds present. No organomegaly or mass.  EXTREMITIES: No pedal edema, cyanosis, or clubbing.  PSYCHIATRIC: The patient is alert and oriented x 3.  SKIN: No obvious rash, lesion, or ulcer.   DATA REVIEW:   CBC Recent Labs  Lab 11/27/17 0513  WBC 10.3  HGB 12.5*  HCT 37.9*  PLT 188    Chemistries  Recent Labs  Lab 11/25/17 2259 11/26/17 0316  NA 140 140  K 3.9 4.1  CL 104 106  CO2 26 25  GLUCOSE 130* 130*  BUN 17 17  CREATININE 1.00 0.96  CALCIUM 9.2 8.3*  AST 24  --   ALT 17  --   ALKPHOS 74  --   BILITOT 0.6  --     Cardiac Enzymes No results for input(s): TROPONINI in the last 168 hours.  Microbiology Results  @MICRORSLT48 @  RADIOLOGY:  Ct Abdomen Pelvis W Contrast  Result Date: 11/26/2017 CLINICAL DATA:  Abdominal pain EXAM: CT ABDOMEN AND PELVIS WITH CONTRAST TECHNIQUE: Multidetector CT imaging of the abdomen and pelvis was performed using the standard protocol following bolus administration of intravenous contrast. CONTRAST:  175mL ISOVUE-300 IOPAMIDOL (ISOVUE-300) INJECTION 61% COMPARISON:  CT 05/27/2012 FINDINGS: Lower chest: Lung  bases demonstrate no acute consolidation or any fusion. Focal air trapping with focal emphysematous appearing lung in the right middle lobe is unchanged. Normal heart size Hepatobiliary: No focal liver abnormality is seen. No gallstones, gallbladder wall thickening, or biliary dilatation. Pancreas: Unremarkable. No pancreatic ductal dilatation or surrounding inflammatory changes. Spleen: Normal in size without focal abnormality. Adrenals/Urinary Tract: Adrenal glands are within normal limits. Stable cysts in the left kidney. No hydronephrosis. Bladder unremarkable Stomach/Bowel: The stomach is nonenlarged. No evidence for a bowel obstruction. Multiple thickened loops of jejunal and ileal small bowel loops with hyperemia throughout the abdomen and pelvis. No intramural air. Vascular/Lymphatic: Mild aortic atherosclerosis. No aneurysmal dilatation. No significantly enlarged lymph nodes. Reproductive: Prostate is unremarkable. Other: No free air. Small amount of free fluid in the abdomen and pelvis. Musculoskeletal: Degenerative changes. No acute or suspicious finding IMPRESSION: 1. Multiple thickened hyperemic jejunal and ileal small bowel loops, suggesting enteritis; differential considerations include ischemia, inflammatory  bowel disease, or infectious enteritis. No pneumatosis or portal venous gas is seen. The visible mesenteric vessels appear patent. Suggest correlation with lactic acid levels. 2. Small amount of free fluid within the abdomen and pelvis. No free air to suggest perforation. Electronically Signed   By: Donavan Foil M.D.   On: 11/26/2017 00:07      Allergies as of 11/27/2017   No Known Allergies     Medication List    STOP taking these medications   hyoscyamine 0.125 MG SL tablet Commonly known as:  LEVSIN SL   nortriptyline 25 MG capsule Commonly known as:  PAMELOR   oseltamivir 75 MG capsule Commonly known as:  TAMIFLU     TAKE these medications   acetaminophen 500 MG  tablet Commonly known as:  TYLENOL Take 1,000 mg by mouth every 6 (six) hours as needed.   albuterol 108 (90 Base) MCG/ACT inhaler Commonly known as:  PROVENTIL HFA;VENTOLIN HFA Inhale 2 puffs into the lungs every 4 (four) hours as needed for wheezing.   aspirin EC 81 MG tablet Take 81 mg by mouth daily.   ciprofloxacin 500 MG tablet Commonly known as:  CIPRO Take 1 tablet (500 mg total) by mouth 2 (two) times daily for 6 days.   escitalopram 10 MG tablet Commonly known as:  LEXAPRO Take 1 tablet (10 mg total) by mouth daily. What changed:  See the new instructions.   Fish Oil 1000 MG Caps Take 1 capsule by mouth daily.   isosorbide mononitrate 30 MG 24 hr tablet Commonly known as:  IMDUR TAKE 1 TABLET BY MOUTH  DAILY   metroNIDAZOLE 500 MG tablet Commonly known as:  FLAGYL Take 1 tablet (500 mg total) by mouth every 8 (eight) hours for 6 days.   multivitamin tablet Take 1 tablet by mouth daily.   nitroGLYCERIN 0.4 MG SL tablet Commonly known as:  NITROSTAT PLACE 1 TABLET UNDER THE TONGUE EVERY 5 MINUTES AS NEEDED FOR CHEST PAIN           Management plans discussed with the patient and he is in agreement. Stable for discharge home  Patient should follow up with pcp  CODE STATUS:     Code Status Orders  (From admission, onward)        Start     Ordered   11/26/17 0256  Full code  Continuous     11/26/17 0255    Code Status History    This patient has a current code status but no historical code status.      TOTAL TIME TAKING CARE OF THIS PATIENT: 38 minutes.    Note: This dictation was prepared with Dragon dictation along with smaller phrase technology. Any transcriptional errors that result from this process are unintentional.  Denzel Etienne M.D on 11/27/2017 at 10:02 AM  Between 7am to 6pm - Pager - [phone] After 6pm go to [email or web address] - password [email or web address]  Office  [phone]  CC: Primary care  physician; Owens Loffler, MD

## 2017-11-29 ENCOUNTER — Other Ambulatory Visit: Payer: Self-pay

## 2017-11-29 ENCOUNTER — Ambulatory Visit: Payer: 59 | Admitting: Family Medicine

## 2017-11-29 ENCOUNTER — Encounter: Payer: Self-pay | Admitting: Family Medicine

## 2017-11-29 VITALS — BP 92/77 | HR 71 | Temp 97.3°F | Ht 65.0 in | Wt 142.0 lb

## 2017-11-29 DIAGNOSIS — R197 Diarrhea, unspecified: Secondary | ICD-10-CM | POA: Diagnosis not present

## 2017-11-29 DIAGNOSIS — K529 Noninfective gastroenteritis and colitis, unspecified: Secondary | ICD-10-CM | POA: Diagnosis not present

## 2017-11-29 DIAGNOSIS — E860 Dehydration: Secondary | ICD-10-CM

## 2017-11-29 LAB — CULTURE, BLOOD (ROUTINE X 2): Special Requests: ADEQUATE

## 2017-11-29 MED ORDER — ESCITALOPRAM OXALATE 10 MG PO TABS: 10.0000 mg | ORAL_TABLET | Freq: Every day | ORAL | 1 refills | Status: DC

## 2017-11-29 NOTE — Progress Notes (Signed)
Dr. [name] T. Wauneta Silveria, MD, [facility] [place], [address] Phone: [phone] Fax: [phone]  11/29/2017  Patient: Isaiah Rangel, MRN: [id number], DOB: June 09, 1961, 57 y.o.  Primary Physician:  Owens Loffler, MD   Chief Complaint  Patient presents with  . Hospitalization Follow-up    Enteritis & Peritonitis   Subjective:   Isaiah Rangel is a 57 y.o. very pleasant male patient who presents with the following:  DATE OF ADMISSION:  11/25/2017     DATE OF DISCHARGE: November 27, 2017   Still feeling pretty bad and fatigued. Had dinner, called EMS - severe abd pain.  Last Thursday, the patient had severe, 10 out of 10 abdominal pain, became hypotensive, looked gray and he could not get off of the floor.  At that point, family called EMS, and he was taken to the emergency room.  He was having severe abdominal pain, and 10 out of 10 pain.  He did have a white count of 30,000 and an abnormal CT of the abdomen and pelvis with probable enteritis.  Subsequently, he was started on antibiotics, he improved and was discharged on November 27, 2017.  He still feels weak, he is not been doing a good job drinking and taking p.o.  Generally, he does not drink much liquids.  Past Medical History, Surgical History, Social History, Family History, Problem List, Medications, and Allergies have been reviewed and updated if relevant.  Patient Active Problem List   Diagnosis Date Noted  . Acute ischemic enteritis ([place]) 11/26/2017  . Enteritis   . Coronary artery disease involving native coronary artery of native heart without angina pectoris 02/21/2016  . Chronic chest pain 06/27/2015  . Cerebrovascular disease 04/29/2015  . Radicular pain of right lower back 12/05/2013  . S/P cardiac catheterization 04/19/2013  . Smoking 09/09/2011  . Hyperlipidemia 04/09/2010  . GERD 09/21/2008  . ERECTILE DYSFUNCTION 09/20/2008    Past Medical  History:  Diagnosis Date  . Anxiety   . BPH (benign prostatic hyperplasia)   . Current smoker   . GERD (gastroesophageal reflux disease)   . OA (osteoarthritis)     Past Surgical History:  Procedure Laterality Date  . BACK SURGERY    . CARDIAC CATHETERIZATION  04/06/13   [facility]- minor luminal irregularities, otherwise normal cors; EF > 55%. Medical management and smoking cessation recommended  . KNEE ARTHROSCOPY W/ PARTIAL MEDIAL MENISCECTOMY Right 2012   [place], 2012  . NASAL SINUS SURGERY    . neck fusion    . VASECTOMY      Social History   Socioeconomic History  . Marital status: Married    Spouse name: Not on file  . Number of children: Not on file  . Years of education: Not on file  . Highest education level: Not on file  Occupational History  . Occupation: [employer]  . Financial resource strain: Not on file  . Food insecurity:    Worry: Not on file    Inability: Not on file  . Transportation needs:    Medical: Not on file    Non-medical: Not on file  Tobacco Use  . Smoking status: Current Every Day Smoker    Packs/day: 0.50    Years: 30.00    Pack years: 15.00    Types: Cigarettes  . Smokeless tobacco: Never Used  . Tobacco comment: he is aware he needs to quit   Substance and Sexual  Activity  . Alcohol use: No    Alcohol/week: 0.0 oz  . Drug use: No  . Sexual activity: Yes    Partners: Female  Lifestyle  . Physical activity:    Days per week: Not on file    Minutes per session: Not on file  . Stress: Not on file  Relationships  . Social connections:    Talks on phone: Not on file    Gets together: Not on file    Attends religious service: Not on file    Active member of club or organization: Not on file    Attends meetings of clubs or organizations: Not on file    Relationship status: Not on file  . Intimate partner violence:    Fear of current or ex partner: Not on file    Emotionally abused: Not on file    Physically abused: Not  on file    Forced sexual activity: Not on file  Other Topics Concern  . Not on file  Social History Narrative   Son of Barnabas Lister Borys([employer])      Likes to work out       Lives with GF    Family History  Problem Relation Age of Onset  . Arthritis Father   . Coronary artery disease Father   . Hypertension Father   . CVA Father 9  . Colon cancer Mother   . Heart attack Brother   . Heart attack Brother   . Coronary artery disease Brother 36  . Esophageal cancer Neg Hx   . Stomach cancer Neg Hx     No Known Allergies  Medication list reviewed and updated in full in [place].   GEN: No acute illnesses, no fevers, chills. GI: decreased PO Pulm: No SOB Interactive and getting along well at home.  Otherwise, ROS is as per the HPI.  Objective:   BP 92/77   Pulse 71   Temp (!) 97.3 F (36.3 C) (Oral)   Ht 5\' 5"  (1.651 m)   Wt 142 lb (64.4 kg)   BMI 23.63 kg/m   GEN: WDWN, NAD, Non-toxic, A & O x 3, he does appear mildly dehydrated HEENT: Atraumatic, Normocephalic. Neck supple. No masses, No LAD. Ears and Nose: No external deformity. CV: RRR, No M/G/R. No JVD. No thrill. No extra heart sounds. PULM: CTA B, no wheezes, crackles, rhonchi. No retractions. No resp. distress. No accessory muscle use. ABD: S, NT, ND, + BS, No rebound, No HSM  EXTR: No c/c/e NEURO Normal gait.  PSYCH: Normally interactive. Conversant. Not depressed or anxious appearing.  Calm demeanor.   Laboratory and Imaging Data: Ct Abdomen Pelvis W Contrast  Result Date: 11/26/2017 CLINICAL DATA:  Abdominal pain EXAM: CT ABDOMEN AND PELVIS WITH CONTRAST TECHNIQUE: Multidetector CT imaging of the abdomen and pelvis was performed using the standard protocol following bolus administration of intravenous contrast. CONTRAST:  148mL ISOVUE-300 IOPAMIDOL (ISOVUE-300) INJECTION 61% COMPARISON:  CT 05/27/2012 FINDINGS: Lower chest: Lung bases demonstrate no acute consolidation or any fusion. Focal  air trapping with focal emphysematous appearing lung in the right middle lobe is unchanged. Normal heart size Hepatobiliary: No focal liver abnormality is seen. No gallstones, gallbladder wall thickening, or biliary dilatation. Pancreas: Unremarkable. No pancreatic ductal dilatation or surrounding inflammatory changes. Spleen: Normal in size without focal abnormality. Adrenals/Urinary Tract: Adrenal glands are within normal limits. Stable cysts in the left kidney. No hydronephrosis. Bladder unremarkable Stomach/Bowel: The stomach is nonenlarged. No evidence for a bowel  obstruction. Multiple thickened loops of jejunal and ileal small bowel loops with hyperemia throughout the abdomen and pelvis. No intramural air. Vascular/Lymphatic: Mild aortic atherosclerosis. No aneurysmal dilatation. No significantly enlarged lymph nodes. Reproductive: Prostate is unremarkable. Other: No free air. Small amount of free fluid in the abdomen and pelvis. Musculoskeletal: Degenerative changes. No acute or suspicious finding IMPRESSION: 1. Multiple thickened hyperemic jejunal and ileal small bowel loops, suggesting enteritis; differential considerations include ischemia, inflammatory bowel disease, or infectious enteritis. No pneumatosis or portal venous gas is seen. The visible mesenteric vessels appear patent. Suggest correlation with lactic acid levels. 2. Small amount of free fluid within the abdomen and pelvis. No free air to suggest perforation. Electronically Signed   By: Donavan Foil M.D.   On: 11/26/2017 00:07    Results for orders placed or performed during the hospital encounter of 11/25/17  Blood Culture (routine x 2)  Result Value Ref Range   Specimen Description      BLOOD RT FOREARM Performed at [facility], [address] [place], [place] [address]    Special Requests      BOTTLES DRAWN AEROBIC AND ANAEROBIC Blood Culture adequate volume Performed at [facility], [address]  [place], [place] [address]    Culture  Setup Time      GRAM POSITIVE COCCI ANAEROBIC BOTTLE ONLY CRITICAL RESULT CALLED TO, READ BACK BY AND VERIFIED WITH: DAVID BESANTI AT 9323 11/27/17.PMH    Culture (A)     VIRIDANS STREPTOCOCCUS THE SIGNIFICANCE OF ISOLATING THIS ORGANISM FROM A SINGLE SET OF BLOOD CULTURES WHEN MULTIPLE SETS ARE DRAWN IS UNCERTAIN. PLEASE NOTIFY THE MICROBIOLOGY DEPARTMENT WITHIN ONE WEEK IF SPECIATION AND SENSITIVITIES ARE REQUIRED. Performed at [facility], [place] [address] [place], [place] [address]    Report Status 11/29/2017 FINAL   Blood Culture (routine x 2)  Result Value Ref Range   Specimen Description BLOOD LT AC    Special Requests      BOTTLES DRAWN AEROBIC AND ANAEROBIC Blood Culture results may not be optimal due to an excessive volume of blood received in culture bottles   Culture      NO GROWTH 4 DAYS Performed at [facility], [address] [place], [place] [address]    Report Status PENDING   Gastrointestinal Panel by PCR , Stool  Result Value Ref Range   Campylobacter species NOT DETECTED NOT DETECTED   Plesimonas shigelloides NOT DETECTED NOT DETECTED   Salmonella species NOT DETECTED NOT DETECTED   Yersinia enterocolitica NOT DETECTED NOT DETECTED   Vibrio species NOT DETECTED NOT DETECTED   Vibrio cholerae NOT DETECTED NOT DETECTED   Enteroaggregative E coli (EAEC) NOT DETECTED NOT DETECTED   Enteropathogenic E coli (EPEC) NOT DETECTED NOT DETECTED   Enterotoxigenic E coli (ETEC) NOT DETECTED NOT DETECTED   Shiga like toxin producing E coli (STEC) NOT DETECTED NOT DETECTED   Shigella/Enteroinvasive E coli (EIEC) NOT DETECTED NOT DETECTED   Cryptosporidium NOT DETECTED NOT DETECTED   Cyclospora cayetanensis NOT DETECTED NOT DETECTED   Entamoeba histolytica NOT DETECTED NOT DETECTED   Giardia lamblia NOT DETECTED NOT DETECTED   Adenovirus F40/41 NOT DETECTED NOT DETECTED   Astrovirus NOT DETECTED NOT DETECTED   Norovirus  GI/GII NOT DETECTED NOT DETECTED   Rotavirus A NOT DETECTED NOT DETECTED   Sapovirus (I, II, IV, and V) NOT DETECTED NOT DETECTED  Blood Culture ID Panel (Reflexed)  Result Value Ref Range   Enterococcus species NOT  DETECTED NOT DETECTED   Listeria monocytogenes NOT DETECTED NOT DETECTED   Staphylococcus species NOT DETECTED NOT DETECTED   Staphylococcus aureus NOT DETECTED NOT DETECTED   Streptococcus species DETECTED (A) NOT DETECTED   Streptococcus agalactiae NOT DETECTED NOT DETECTED   Streptococcus pneumoniae NOT DETECTED NOT DETECTED   Streptococcus pyogenes NOT DETECTED NOT DETECTED   Acinetobacter baumannii NOT DETECTED NOT DETECTED   Enterobacteriaceae species NOT DETECTED NOT DETECTED   Enterobacter cloacae complex NOT DETECTED NOT DETECTED   Escherichia coli NOT DETECTED NOT DETECTED   Klebsiella oxytoca NOT DETECTED NOT DETECTED   Klebsiella pneumoniae NOT DETECTED NOT DETECTED   Proteus species NOT DETECTED NOT DETECTED   Serratia marcescens NOT DETECTED NOT DETECTED   Haemophilus influenzae NOT DETECTED NOT DETECTED   Neisseria meningitidis NOT DETECTED NOT DETECTED   Pseudomonas aeruginosa NOT DETECTED NOT DETECTED   Candida albicans NOT DETECTED NOT DETECTED   Candida glabrata NOT DETECTED NOT DETECTED   Candida krusei NOT DETECTED NOT DETECTED   Candida parapsilosis NOT DETECTED NOT DETECTED   Candida tropicalis NOT DETECTED NOT DETECTED  Lipase, blood  Result Value Ref Range   Lipase 28 11 - 51 U/L  Comprehensive metabolic panel  Result Value Ref Range   Sodium 140 135 - 145 mmol/L   Potassium 3.9 3.5 - 5.1 mmol/L   Chloride 104 101 - 111 mmol/L   CO2 26 22 - 32 mmol/L   Glucose, Bld 130 (H) 65 - 99 mg/dL   BUN 17 6 - 20 mg/dL   Creatinine, Ser 1.00 0.61 - 1.24 mg/dL   Calcium 9.2 8.9 - 10.3 mg/dL   Total Protein 7.4 6.5 - 8.1 g/dL   Albumin 4.3 3.5 - 5.0 g/dL   AST 24 15 - 41 U/L   ALT 17 17 - 63 U/L   Alkaline Phosphatase 74 38 - 126 U/L   Total  Bilirubin 0.6 0.3 - 1.2 mg/dL   GFR calc non Af Amer >60 >60 mL/min   GFR calc Af Amer >60 >60 mL/min   Anion gap 10 5 - 15  CBC  Result Value Ref Range   WBC 27.9 (H) 3.8 - 10.6 K/uL   RBC 5.31 4.40 - 5.90 MIL/uL   Hemoglobin 16.5 13.0 - 18.0 g/dL   HCT 50.0 40.0 - 52.0 %   MCV 94.3 80.0 - 100.0 fL   MCH 31.0 26.0 - 34.0 pg   MCHC 32.9 32.0 - 36.0 g/dL   RDW 15.7 (H) 11.5 - 14.5 %   Platelets 248 150 - 440 K/uL  Urinalysis, Complete w Microscopic  Result Value Ref Range   Color, Urine YELLOW (A) YELLOW   APPearance CLEAR (A) CLEAR   Specific Gravity, Urine 1.040 (H) 1.005 - 1.030   pH 6.0 5.0 - 8.0   Glucose, UA NEGATIVE NEGATIVE mg/dL   Hgb urine dipstick NEGATIVE NEGATIVE   Bilirubin Urine NEGATIVE NEGATIVE   Ketones, ur NEGATIVE NEGATIVE mg/dL   Protein, ur NEGATIVE NEGATIVE mg/dL   Nitrite NEGATIVE NEGATIVE   Leukocytes, UA NEGATIVE NEGATIVE   RBC / HPF 6-30 0 - 5 RBC/hpf   WBC, UA 0-5 0 - 5 WBC/hpf   Bacteria, UA NONE SEEN NONE SEEN   Squamous Epithelial / LPF 0-5 (A) NONE SEEN  Lactic acid, plasma  Result Value Ref Range   Lactic Acid, Venous 1.5 0.5 - 1.9 mmol/L  Lactic acid, plasma  Result Value Ref Range   Lactic Acid, Venous 1.1 0.5 - 1.9 mmol/L  HIV antibody (Routine Testing)  Result Value Ref Range   HIV Screen 4th Generation wRfx Non Reactive Non Reactive  Basic metabolic panel  Result Value Ref Range   Sodium 140 135 - 145 mmol/L   Potassium 4.1 3.5 - 5.1 mmol/L   Chloride 106 101 - 111 mmol/L   CO2 25 22 - 32 mmol/L   Glucose, Bld 130 (H) 65 - 99 mg/dL   BUN 17 6 - 20 mg/dL   Creatinine, Ser 0.96 0.61 - 1.24 mg/dL   Calcium 8.3 (L) 8.9 - 10.3 mg/dL   GFR calc non Af Amer >60 >60 mL/min   GFR calc Af Amer >60 >60 mL/min   Anion gap 9 5 - 15  CBC  Result Value Ref Range   WBC 26.8 (H) 3.8 - 10.6 K/uL   RBC 4.50 4.40 - 5.90 MIL/uL   Hemoglobin 14.1 13.0 - 18.0 g/dL   HCT 41.9 40.0 - 52.0 %   MCV 93.2 80.0 - 100.0 fL   MCH 31.3 26.0 - 34.0 pg     MCHC 33.6 32.0 - 36.0 g/dL   RDW 15.5 (H) 11.5 - 14.5 %   Platelets 220 150 - 440 K/uL  Glucose, capillary  Result Value Ref Range   Glucose-Capillary 109 (H) 65 - 99 mg/dL  CBC with Differential/Platelet  Result Value Ref Range   WBC 10.3 3.8 - 10.6 K/uL   RBC 4.03 (L) 4.40 - 5.90 MIL/uL   Hemoglobin 12.5 (L) 13.0 - 18.0 g/dL   HCT 37.9 (L) 40.0 - 52.0 %   MCV 94.0 80.0 - 100.0 fL   MCH 31.1 26.0 - 34.0 pg   MCHC 33.1 32.0 - 36.0 g/dL   RDW 15.8 (H) 11.5 - 14.5 %   Platelets 188 150 - 440 K/uL   Neutrophils Relative % 45 %   Neutro Abs 4.7 1.4 - 6.5 K/uL   Lymphocytes Relative 42 %   Lymphs Abs 4.4 (H) 1.0 - 3.6 K/uL   Monocytes Relative 7 %   Monocytes Absolute 0.7 0.2 - 1.0 K/uL   Eosinophils Relative 5 %   Eosinophils Absolute 0.5 0 - 0.7 K/uL   Basophils Relative 1 %   Basophils Absolute 0.1 0 - 0.1 K/uL     Assessment and Plan:   Enteritis - Plan: CBC with Differential/Platelet, Basic metabolic panel  Dehydration  Diarrhea, unspecified type  At the time of this evaluation, the patient was looking much better compared to when he was admitted to the hospital.  He is taking Cipro and Flagyl, and this is been causing some diarrhea.  He is not doing a great job with oral fluid intake, and he is mildly dehydrated at this point.  I encouraged him to take in any form of oral liquid that he would like that does not have caffeine.  He is going to try to push some fluids.  He also has been off of his SSRI.  To me it appears that I refilled this in January, but it seems as if the patient never got his refill.  He is still dealing with some significant depression after his father's death.  I certainly refilled this.  At the time of this dictation, I have received a message from his wife, he has had some chest pain over the night as well as continued diarrhea, and given that he does have some nonobstructing coronary disease, as well as very recent hospitalization and complex  abdominal pain, I think that additional  evaluation is prudent, ER evaluation recommended.  Meds ordered this encounter  Medications  . escitalopram (LEXAPRO) 10 MG tablet    Sig: Take 1 tablet (10 mg total) by mouth daily.    Dispense:  90 tablet    Refill:  1   Medications Discontinued During This Encounter  Medication Reason  . escitalopram (LEXAPRO) 10 MG tablet Reorder   Orders Placed This Encounter  Procedures  . CBC with Differential/Platelet  . Basic metabolic panel    Signed,  [name] T. Shavonte Zhao, MD   Allergies as of 11/29/2017   No Known Allergies     Medication List        Accurate as of 11/29/17 11:59 PM. Always use your most recent med list.          acetaminophen 500 MG tablet Commonly known as:  TYLENOL Take 1,000 mg by mouth every 6 (six) hours as needed.   albuterol 108 (90 Base) MCG/ACT inhaler Commonly known as:  PROVENTIL HFA;VENTOLIN HFA Inhale 2 puffs into the lungs every 4 (four) hours as needed for wheezing.   aspirin EC 81 MG tablet Take 81 mg by mouth daily.   ciprofloxacin 500 MG tablet Commonly known as:  CIPRO Take 1 tablet (500 mg total) by mouth 2 (two) times daily for 6 days.   escitalopram 10 MG tablet Commonly known as:  LEXAPRO Take 1 tablet (10 mg total) by mouth daily.   Fish Oil 1000 MG Caps Take 1 capsule by mouth daily.   isosorbide mononitrate 30 MG 24 hr tablet Commonly known as:  IMDUR TAKE 1 TABLET BY MOUTH  DAILY   metroNIDAZOLE 500 MG tablet Commonly known as:  FLAGYL Take 1 tablet (500 mg total) by mouth every 8 (eight) hours for 6 days.   multivitamin tablet Take 1 tablet by mouth daily.   nitroGLYCERIN 0.4 MG SL tablet Commonly known as:  NITROSTAT PLACE 1 TABLET UNDER THE TONGUE EVERY 5 MINUTES AS NEEDED FOR CHEST PAIN

## 2017-11-30 ENCOUNTER — Emergency Department
Admission: EM | Admit: 2017-11-30 | Discharge: 2017-11-30 | Disposition: A | Discharge status: Home / Self Care | Payer: 59 | Attending: Emergency Medicine | Admitting: Emergency Medicine

## 2017-11-30 ENCOUNTER — Other Ambulatory Visit: Payer: Self-pay

## 2017-11-30 ENCOUNTER — Ambulatory Visit: Payer: Self-pay | Admitting: *Deleted

## 2017-11-30 ENCOUNTER — Ambulatory Visit: Payer: 59 | Admitting: Gastroenterology

## 2017-11-30 ENCOUNTER — Telehealth: Payer: Self-pay | Admitting: Gastroenterology

## 2017-11-30 DIAGNOSIS — Z79899 Other long term (current) drug therapy: Secondary | ICD-10-CM | POA: Diagnosis not present

## 2017-11-30 DIAGNOSIS — I2510 Atherosclerotic heart disease of native coronary artery without angina pectoris: Secondary | ICD-10-CM | POA: Diagnosis not present

## 2017-11-30 DIAGNOSIS — E860 Dehydration: Secondary | ICD-10-CM | POA: Diagnosis not present

## 2017-11-30 DIAGNOSIS — R079 Chest pain, unspecified: Secondary | ICD-10-CM | POA: Diagnosis not present

## 2017-11-30 DIAGNOSIS — F17210 Nicotine dependence, cigarettes, uncomplicated: Secondary | ICD-10-CM | POA: Insufficient documentation

## 2017-11-30 DIAGNOSIS — R197 Diarrhea, unspecified: Secondary | ICD-10-CM

## 2017-11-30 DIAGNOSIS — E785 Hyperlipidemia, unspecified: Secondary | ICD-10-CM | POA: Insufficient documentation

## 2017-11-30 DIAGNOSIS — Z7982 Long term (current) use of aspirin: Secondary | ICD-10-CM | POA: Diagnosis not present

## 2017-11-30 DIAGNOSIS — R0789 Other chest pain: Secondary | ICD-10-CM | POA: Diagnosis not present

## 2017-11-30 LAB — BASIC METABOLIC PANEL
BUN: 13 mg/dL (ref 6–23)
CO2: 29 mEq/L (ref 19–32)
Calcium: 9.3 mg/dL (ref 8.4–10.5)
Chloride: 105 mEq/L (ref 96–112)
Creatinine, Ser: 0.89 mg/dL (ref 0.40–1.50)
GFR: 93.42 mL/min (ref >60.00)
Glucose, Bld: 104 mg/dL — ABNORMAL HIGH (ref 70–99)
Potassium: 4.2 mEq/L (ref 3.5–5.1)
Sodium: 140 mEq/L (ref 135–145)

## 2017-11-30 LAB — COMPREHENSIVE METABOLIC PANEL
ALT: 54 U/L (ref 17–63)
AST: 60 U/L — ABNORMAL HIGH (ref 15–41)
Albumin: 4.2 g/dL (ref 3.5–5.0)
Alkaline Phosphatase: 71 U/L (ref 38–126)
Anion gap: 6 (ref 5–15)
BUN: 14 mg/dL (ref 6–20)
CO2: 26 mmol/L (ref 22–32)
Calcium: 9.2 mg/dL (ref 8.9–10.3)
Chloride: 106 mmol/L (ref 101–111)
Creatinine, Ser: 1.21 mg/dL (ref 0.61–1.24)
GFR calc Af Amer: >60 mL/min (ref >60)
GFR calc non Af Amer: >60 mL/min (ref >60)
Glucose, Bld: 197 mg/dL — ABNORMAL HIGH (ref 65–99)
Potassium: 4.5 mmol/L (ref 3.5–5.1)
Sodium: 138 mmol/L (ref 135–145)
Total Bilirubin: 0.5 mg/dL (ref 0.3–1.2)
Total Protein: 7.3 g/dL (ref 6.5–8.1)

## 2017-11-30 LAB — CBC WITH DIFFERENTIAL/PLATELET
Basophils Absolute: 0.1 10*3/uL (ref 0.0–0.1)
Basophils Relative: 1.1 % (ref 0.0–3.0)
Eosinophils Absolute: 0.5 10*3/uL (ref 0.0–0.7)
Eosinophils Relative: 4.8 % (ref 0.0–5.0)
HCT: 41.9 % (ref 39.0–52.0)
Hemoglobin: 14.2 g/dL (ref 13.0–17.0)
Lymphocytes Relative: 33 % (ref 12.0–46.0)
Lymphs Abs: 3.5 10*3/uL (ref 0.7–4.0)
MCHC: 33.8 g/dL (ref 30.0–36.0)
MCV: 93.8 fl (ref 78.0–100.0)
Monocytes Absolute: 0.8 10*3/uL (ref 0.1–1.0)
Monocytes Relative: 7.3 % (ref 3.0–12.0)
Neutro Abs: 5.6 10*3/uL (ref 1.4–7.7)
Neutrophils Relative %: 53.8 % (ref 43.0–77.0)
Platelets: 274 10*3/uL (ref 150.0–400.0)
RBC: 4.46 Mil/uL (ref 4.22–5.81)
RDW: 15.6 % — ABNORMAL HIGH (ref 11.5–15.5)
WBC: 10.5 10*3/uL (ref 4.0–10.5)

## 2017-11-30 LAB — URINALYSIS, COMPLETE (UACMP) WITH MICROSCOPIC
Bacteria, UA: NONE SEEN
Bilirubin Urine: NEGATIVE
Glucose, UA: 50 mg/dL — AB
Hgb urine dipstick: NEGATIVE
Ketones, ur: NEGATIVE mg/dL
Leukocytes, UA: NEGATIVE
Nitrite: NEGATIVE
Protein, ur: NEGATIVE mg/dL
Specific Gravity, Urine: 1.005 (ref 1.005–1.030)
pH: 6 (ref 5.0–8.0)

## 2017-11-30 LAB — CBC
HCT: 43.4 % (ref 40.0–52.0)
Hemoglobin: 14.5 g/dL (ref 13.0–18.0)
MCH: 31.6 pg (ref 26.0–34.0)
MCHC: 33.4 g/dL (ref 32.0–36.0)
MCV: 94.7 fL (ref 80.0–100.0)
Platelets: 265 10*3/uL (ref 150–440)
RBC: 4.58 MIL/uL (ref 4.40–5.90)
RDW: 15.8 % — ABNORMAL HIGH (ref 11.5–14.5)
WBC: 13.3 10*3/uL — ABNORMAL HIGH (ref 3.8–10.6)

## 2017-11-30 LAB — TROPONIN I: Troponin I: <0.03 ng/mL (ref <0.03)

## 2017-11-30 LAB — LIPASE, BLOOD: Lipase: 24 U/L (ref 11–51)

## 2017-11-30 MED ORDER — SODIUM CHLORIDE 0.9 % IV BOLUS
1000.0000 mL | Freq: Once | INTRAVENOUS | Status: AC
  Administered 2017-11-30: 1000 mL via INTRAVENOUS

## 2017-11-30 MED ORDER — ONDANSETRON HCL 4 MG/2ML IJ SOLN
4.0000 mg | Freq: Once | INTRAMUSCULAR | Status: AC
Start: 2017-11-30 — End: 2017-11-30
  Administered 2017-11-30: 4 mg via INTRAVENOUS
  Filled 2017-11-30: qty 2

## 2017-11-30 NOTE — ED Triage Notes (Signed)
Pt was admiitted over the weekend and given abx, pt is currently on cipro and flagyl and began having watery diarrhea for the past 3 days with LLQ pain.

## 2017-11-30 NOTE — Telephone Encounter (Signed)
Contacted pt's wife, Mitzi, and gave information per Rena , " Dr  Lorelei Pont is out of the office but he checks his inbox; also if the pt gets worse before a return call from the office or Dr Lorelei Pont, then the family should take him to the ED"; Mitzi verbalizes understanding.

## 2017-11-30 NOTE — Discharge Instructions (Addendum)
As we discussed please use loperamide/Imodium as needed for diarrhea, as written on the box.  Please drink plenty of fluids.  Please follow-up with your doctor this week for recheck/reevaluation.  Please call to discuss further care with GI medicine.  Return to the emergency department for any further chest pain, shortness of breath, lightheadedness/passing out, or any other symptom personally concerning to yourself.

## 2017-11-30 NOTE — Telephone Encounter (Signed)
Pt's wife, Mitzi, called stating that her husband is having diarrhea and is being treated for enteritis with Cipro 500 mg twice daily and flagyl 500 mg three times daily; additionally the pt was having angina last night and she gave him nitro sl x 2; she states that he fell; his BP 80/40 around 0400; she gave him 2 doses of Pepto last night and 1 dose this morning; nurse triage initiated; recommendations made per protocol to include going to ED or urgent care; however the pt's wife would like for Dr Lorelei Pont to be notified she would like for Dr Lorelei Pont to call her because husband needs IV fluids and something to stop diarrhea; she can be contacted at [phone] spoke with Mearl Latin; she states that Dr Lorelei Pont is out of the office but he checks his inbox;also if the pt gets worse before a return call from the office or Dr Lorelei Pont, then the family should take him to ED will relay information to the pt's family; will route to office for notification of this encounter.  Reason for Disposition . [1] Drinking very little AND [2] dehydration suspected (e.g., no urine > 12 hours, very dry mouth, very lightheaded)  Answer Assessment - Initial Assessment Questions 1. DIARRHEA SEVERITY: "How bad is the diarrhea?" "How many extra stools have you had in the past 24 hours than normal?"    - MILD: Few loose or mushy BMs; increase of 1-3 stools over normal daily number of stools; mild increase in ostomy output.   - MODERATE: Increase of 4-6 stools daily over normal; moderate increase in ostomy output.   - SEVERE (or Worst Possible): Increase of 7 or more stools daily over normal; moderate increase in ostomy output; incontinence.     severe 2. ONSET: "When did the diarrhea begin?"     11/28/17 3. BM CONSISTENCY: "How loose or watery is the diarrhea?"      watery 4. VOMITING: "Are you also vomiting?" If so, ask: "How many times in the past 24 hours?"      No; nausea this am at 0400 x 1 5. ABDOMINAL PAIN: "Are you having  any abdominal pain?" If yes: "What does it feel like?" (e.g., crampy, dull, intermittent, constant)      Intermittent, crampy at 3:00 position   6. ABDOMINAL PAIN SEVERITY: If present, ask: "How bad is the pain?"  (e.g., Scale 1-10; mild, moderate, or severe)    - MILD (1-3): doesn't interfere with normal activities, abdomen soft and not tender to touch     - MODERATE (4-7): interferes with normal activities or awakens from sleep, tender to touch     - SEVERE (8-10): excruciating pain, doubled over, unable to do any normal activities       Moderate to severe 7. ORAL INTAKE: If vomiting, "Have you been able to drink liquids?" "How much fluids have you had in the past 24 hours?"     No vomiting; had about 32 oz in the past 24 hurs 8. HYDRATION: "Any signs of dehydration?" (e.g., dry mouth [not just dry lips], too weak to stand, dizziness, new weight loss) "When did you last urinate?"     Dizziness, moderate turgor, headache, moderately dry mouth, chest pain and sweating this morning at 0400, ?vagal; urinate this am around 0400 9. EXPOSURE: "Have you traveled to a foreign country recently?" "Have you been exposed to anyone with diarrhea?" "Could you have eaten any food that was spoiled?"     no 10. OTHER SYMPTOMS: "Do you  have any other symptoms?" (e.g., fever, blood in stool)       no 11. PREGNANCY: "Is there any chance you are pregnant?" "When was your last menstrual period?"       n/a  Protocols used: DIARRHEA-A-AH

## 2017-11-30 NOTE — ED Notes (Signed)
Pt unable to provide stool sample.

## 2017-11-30 NOTE — Telephone Encounter (Signed)
He is in the ER - I think that is a good call.   Chest pain last night, known coronary disease, dehydration, admitted last week with 10/10 abdominal pain - I suspect they are likely to observe and treat him for hours.   For diarrhea, I would first do Immodium A-D, 2 tablets now then 1 tablet after every bowel movement. This almost always works.

## 2017-11-30 NOTE — Telephone Encounter (Addendum)
Mr. Degrazia is currently in the ER.  I will let them address the diarrhea issue.

## 2017-11-30 NOTE — ED Provider Notes (Signed)
[facility] Emergency Department Provider Note  Time seen: 12:18 PM  I have reviewed the triage vital signs and the nursing notes.   HISTORY  Chief Complaint Abdominal Pain and Diarrhea    HPI HAZE Rangel is a 57 y.o. male with a past medical history of anxiety, BPH, gastric reflux, arthritis, recent diagnosis of enteritis on antibiotics who presents to the emergency department for diarrhea and chest pains.  According to the patient 5 days ago he was admitted to the hospital for abdominal pain nausea vomiting ultimately diagnosed with enteritis on CT and discharged on antibiotics.  Patient states since going home he has had profuse and frequent diarrhea.  States he was feeling lightheaded, developed chest pains this morning around 4 AM, took a nitroglycerin tablet but the chest pain remains so he took a second nitroglycerin tablet at that time he had a syncopal episode.  Patient continued to have diarrhea later this morning so they brought the patient to the emergency department for evaluation.  Patient states he went to his PCP yesterday but they were unable to give IV fluids.  Patient states mild cramping pain in the left lower quadrant but states the pain has improved since discharge.  Denies any nausea or recent vomiting.  Denies any black or bloody stool.  No fever.  Largely negative review of systems otherwise.   Past Medical History:  Diagnosis Date  . Anxiety   . BPH (benign prostatic hyperplasia)   . Current smoker   . GERD (gastroesophageal reflux disease)   . OA (osteoarthritis)     Patient Active Problem List   Diagnosis Date Noted  . Acute ischemic enteritis ([place]) 11/26/2017  . Enteritis   . Coronary artery disease involving native coronary artery of native heart without angina pectoris 02/21/2016  . Chronic chest pain 06/27/2015  . Cerebrovascular disease 04/29/2015  . Radicular pain of right lower back 12/05/2013  . S/P cardiac  catheterization 04/19/2013  . Smoking 09/09/2011  . Hyperlipidemia 04/09/2010  . GERD 09/21/2008  . ERECTILE DYSFUNCTION 09/20/2008    Past Surgical History:  Procedure Laterality Date  . BACK SURGERY    . CARDIAC CATHETERIZATION  04/06/13   [facility]- minor luminal irregularities, otherwise normal cors; EF > 55%. Medical management and smoking cessation recommended  . KNEE ARTHROSCOPY W/ PARTIAL MEDIAL MENISCECTOMY Right 2012   [place], 2012  . NASAL SINUS SURGERY    . neck fusion    . VASECTOMY      Prior to Admission medications   Medication Sig Start Date End Date Taking? Authorizing Provider  acetaminophen (TYLENOL) 500 MG tablet Take 1,000 mg by mouth every 6 (six) hours as needed.      [provider]  albuterol (PROVENTIL HFA;VENTOLIN HFA) 108 (90 Base) MCG/ACT inhaler Inhale 2 puffs into the lungs every 4 (four) hours as needed for wheezing. 09/29/16   Tower, Wynelle Fanny, MD  aspirin EC 81 MG tablet Take 81 mg by mouth daily.    [provider]  ciprofloxacin (CIPRO) 500 MG tablet Take 1 tablet (500 mg total) by mouth 2 (two) times daily for 6 days. 11/27/17 12/03/17  Bettey Costa, MD  escitalopram (LEXAPRO) 10 MG tablet Take 1 tablet (10 mg total) by mouth daily. 11/29/17   [name], [name], MD  isosorbide mononitrate (IMDUR) 30 MG 24 hr tablet TAKE 1 TABLET BY MOUTH  DAILY 08/20/17   Minna Merritts, MD  metroNIDAZOLE (FLAGYL) 500 MG tablet Take 1 tablet (500 mg  total) by mouth every 8 (eight) hours for 6 days. 11/27/17 12/03/17  Bettey Costa, MD  Multiple Vitamin (MULTIVITAMIN) tablet Take 1 tablet by mouth daily.      [provider]  nitroGLYCERIN (NITROSTAT) 0.4 MG SL tablet PLACE 1 TABLET UNDER THE TONGUE EVERY 5 MINUTES AS NEEDED FOR CHEST PAIN 01/22/17   Minna Merritts, MD  Omega-3 Fatty Acids (FISH OIL) 1000 MG CAPS Take 1 capsule by mouth daily.    [provider]    No Known Allergies  Family History  Problem Relation Age of Onset  .  Arthritis Father   . Coronary artery disease Father   . Hypertension Father   . CVA Father 59  . Colon cancer Mother   . Heart attack Brother   . Heart attack Brother   . Coronary artery disease Brother 84  . Esophageal cancer Neg Hx   . Stomach cancer Neg Hx     Social History Social History   Tobacco Use  . Smoking status: Current Every Day Smoker    Packs/day: 0.50    Years: 30.00    Pack years: 15.00    Types: Cigarettes  . Smokeless tobacco: Never Used  . Tobacco comment: he is aware he needs to quit   Substance Use Topics  . Alcohol use: No    Alcohol/week: 0.0 oz  . Drug use: No    Review of Systems Constitutional: Negative for fever. Eyes: Negative for visual complaints ENT: Negative for recent illness/congestion Cardiovascular: Positive for chest pressure this morning, now resolved Respiratory: Negative for shortness of breath. Gastrointestinal: Crampy lower abdominal discomfort although improved from discharge per patient.  Continues to have very loose watery diarrhea.  Denies black or bloody stool. Genitourinary: Negative for urinary compaints.  Negative for dysuria. Musculoskeletal: Negative for musculoskeletal complaints Skin: Negative for skin complaints  Neurological: Negative for headache All other ROS negative  ____________________________________________   PHYSICAL EXAM:  VITAL SIGNS: ED Triage Vitals  Enc Vitals Group     BP 11/30/17 0912 121/75     Pulse Rate 11/30/17 0912 69     Resp 11/30/17 0912 18     Temp 11/30/17 0914 97.6 F (36.4 C)     Temp Source 11/30/17 0912 Oral     SpO2 11/30/17 0912 99 %     Weight 11/30/17 0913 142 lb (64.4 kg)     Height 11/30/17 0913 5\' 5"  (1.651 m)     Head Circumference --      Peak Flow --      Pain Score 11/30/17 0922 0     Pain Loc --      Pain Edu? --      Excl. in [place]? --    Constitutional: Alert and oriented. Well appearing and in no distress. Eyes: Normal exam ENT   Head:  Normocephalic and atraumatic.   Mouth/Throat: Mucous membranes are moist. Cardiovascular: Normal rate, regular rhythm. No murmur Respiratory: Normal respiratory effort without tachypnea nor retractions. Breath sounds are clear  Gastrointestinal: Soft and nontender. No distention.  Musculoskeletal: Nontender with normal range of motion in all extremities.  Neurologic:  Normal speech and language. No gross focal neurologic deficits Skin:  Skin is warm, dry and intact.  Psychiatric: Mood and affect are normal.  ____________________________________________    EKG  EKG reviewed and interpreted by myself shows normal sinus rhythm at 60 bpm with a narrow QRS, normal axis, normal intervals, nonspecific but no concerning ST changes.  ____________________________________________  INITIAL IMPRESSION / ASSESSMENT AND PLAN / ED COURSE  Pertinent labs & imaging results that were available during my care of the patient were reviewed by me and considered in my medical decision making (see chart for details).  Patient presents to the emergency department for continued diarrhea, near syncopal episode this morning with chest discomfort.  Differential would include dehydration, hypovolemia, hypotension secondary to nitroglycerin, ACS, left light/metabolic abnormality, infectious etiology.  Patient is currently taking ciprofloxacin and Flagyl which could have induced antibiotic associated diarrhea.  I reviewed the patient's records including his stool culture/antigen test which was negative.  CT scan most consistent with enteritis.  Discussed with the patient, no history of inflammatory bowel disease that he is aware of.  Patient has a completely nontender abdomen currently.  Mild leukocytosis at 13,000.  Troponin is negative.  Patient states he had mild chest pressure this morning, took 2 nitroglycerin tablets and then had a near syncopal episode.  Highly suspect the patient is likely hypovolemic  secondary to dehydration, due to the hypotension induced by nitroglycerin likely caused a near syncopal episode.  Currently the patient appears very well, denies any chest discomfort.  Benign abdominal exam.  Will attempt to run a C. difficile test if we can have the patient provide a stool sample, states he does not feel like he can at this time.  We will IV hydrate and continue to closely monitor while in the emergency department.  Patient's workup is reassuring besides a mild leukocytosis.  Is not been able to have any bowel movements in the emergency department.  States he feels much better after fluids.  Troponin is negative.  EKG is reassuring.  Highly suspect dehydration.  Antibiotics could likely be contributing to the diarrhea.  Given a negative stool antigen test and the patient only has 1 day remaining of antibiotics I discussed with the patient going ahead and discontinuing his antibiotics, using loperamide as needed as written on the box and drinking plenty of fluids.  Patient and wife are agreeable to this plan of care.  They will follow-up with GI medicine this week.  ____________________________________________   FINAL CLINICAL IMPRESSION(S) / ED DIAGNOSES  Diarrhea Dehydration Chest pain    Harvest Dark, MD 11/30/17 1401

## 2017-11-30 NOTE — Telephone Encounter (Signed)
LVM for patient to call and reschedule a follow up appt with Dr. Bonna Gains.

## 2017-12-01 LAB — CULTURE, BLOOD (ROUTINE X 2): Culture: NO GROWTH

## 2017-12-01 NOTE — Telephone Encounter (Signed)
Patient has decided not to come in for a follow up appointment.  He stated just going to the hospital has put him in a financial bind and he's not sure anyone can help him.  I told him to call us if needed.

## 2017-12-08 ENCOUNTER — Telehealth: Payer: Self-pay | Admitting: Family Medicine

## 2017-12-08 NOTE — Telephone Encounter (Signed)
Mitzi notified as instructed by telephone.  She states they had already decided Ron would not eat anymore quail eggs.  She is concerned if he would have the same reaction with chicken eggs because they have both quail and chicken.  I advised that he could slowly introduce the chicken eggs back into his diet and know that if he experiences they same symptoms to stop eggs all together.  Mitzi will talk to Ron about allergy testing and will let us know if they want to proceed.

## 2017-12-08 NOTE — Telephone Encounter (Signed)
Copied from [place]. Topic: Inquiry >> Dec 08, 2017 12:34 PM Oliver Pila B wrote: Reason for CRM: pt's wife called and wants a call back about pt b/c he has thrown up and has abdominal pain which is described as a level 3, pt's wife declined triage, call pt to advise

## 2017-12-08 NOTE — Telephone Encounter (Signed)
Please call  I looked it up and it looks like it is possible. For right now, I would just stop with the quail's eggs.   Would he want to go to an allergist for testing for it? I will have to check and see if they can do it?

## 2017-12-08 NOTE — Telephone Encounter (Signed)
I spoke with Mitzi(DPR signed) 12/07/17 in the evening; pt ate quail eggs, bacon and oatmeal and after eating developed abd pain (pain level 3) and vomited x 1. On 11/30/17 pt was seen in ED due to more severe abd pain and continuous vomiting but pt had eaten the same meal as last night. Mitzi wants Dr ONEOK opinion if thinks could be quail eggs causing the abd pain and vomiting and Mitzi wants to know if there is any type testing that can be done on quail eggs to see if they are causing these symptoms before she eats any more of them or she sells any of the quail eggs. Today pt has no fever, vomiting or abd pain. Pt is at work and said his abd is sore but no pain and pt has slight weak feeling. Mitzi request cb.

## 2017-12-13 ENCOUNTER — Ambulatory Visit: Payer: 59 | Admitting: Gastroenterology

## 2017-12-28 ENCOUNTER — Telehealth: Payer: Self-pay

## 2017-12-28 MED ORDER — ISOSORBIDE MONONITRATE ER 30 MG PO TB24: 30.0000 mg | ORAL_TABLET | Freq: Every day | ORAL | 3 refills | Status: DC

## 2017-12-28 NOTE — Telephone Encounter (Signed)
Refill for isosorbide mononitrate sent in to mail order pharmacy.

## 2017-12-28 NOTE — Telephone Encounter (Signed)
Received a fox from [facility] for a refill on patients Isosorbide 30MG  tablet.  Per Dr. Donivan Scull last office note he wanted patient to start cutting this in half but on medication list it states he takes a whole tablet daily.   Called patient to verify how patient was currently taking this medication.  He stated he was taking one whole tablet that when he did try the half tablet it was not doing the job for him.

## 2017-12-28 NOTE — Addendum Note (Signed)
Addended by: Valora Corporal on: 12/28/2017 12:48 PM   Modules accepted: Orders

## 2018-04-09 ENCOUNTER — Other Ambulatory Visit: Payer: Self-pay | Admitting: Cardiovascular Disease

## 2018-04-11 ENCOUNTER — Telehealth: Payer: Self-pay | Admitting: Cardiovascular Disease

## 2018-04-11 NOTE — Telephone Encounter (Signed)
LMOV for patient call and schedule follow up with Dr. Rockey Situ

## 2018-04-11 NOTE — Telephone Encounter (Signed)
-----   Message from Anselm Pancoast, [place] sent at 04/11/2018  9:04 AM EDT ----- Please contact patient for a follow up appointment for Aug. 2019.  Thanks, Ivin Booty

## 2018-04-15 NOTE — Telephone Encounter (Signed)
Scheduled

## 2018-05-15 NOTE — Progress Notes (Deleted)
Patient ID: Isaiah Rangel, male   DOB: Jun 01, 1961, 57 y.o.   MRN: [id number]   Cardiology Office Note  Date:  05/15/2018   ID:  [name], DOB [birth year], MRN [id number]  PCP:  Owens Loffler, MD   No chief complaint on file.   HPI:  57 year old gentleman who is very active at baseline who works as a Company secretary,  history of smoking Who continues to smoke   GERD,  remote neck injury with fusion surgery in 2002 with chronic neck pain and tightness,  chronic chest pain symptoms, relieved with nitroglycerin previous cardiac catheterization August 2014 showing minimal coronary artery disease,  CT coronary calcium score reviewed with him, score 135 presenting for follow-up of chest pain symptoms  In the ER 11/2017 for chest pain and diarrhea Hospital records reviewed with the patient in detail diagnosed with enteritis on CT and discharged on antibiotics.  continued profuse and frequent diarrhea.  feeling lightheaded, developed chest pains around 4 AM, took a nitroglycerin tablet but the chest pain remains so he took a second nitroglycerin tablet at that time he had a syncopal episode.  Weight down 7 pounds Reports he has significant stress at home taking care of elderly father who is ill Trying to drink lots of Gatorade daily Denies having significant orthostasis symptoms  Initial blood pressure today 90 systolic, 630 systolic on my recheck Periodically takes nitroglycerin for chest pain Chest pain unrelated to exertion  On previous CT scan chest, coronary calcifications  in the distal left main, proximal LAD ostial region  EKG on today's visit shows normal sinus rhythm with rate 69 bpm, no significant ST or T-wave changes  Other past medical history Previous GI workup was essentially negative, had EGD In the past, Ran out of isosorbide, symptoms became worse , almost went to the emergency room.   reports he can have a good day with no symptoms, very active at work, moves  [employer], very heavy items weighing several 100 pounds. Other days can be doing nothing with symptoms  Reports he has levsin, takes this occasionally, unclear if it helps  a stress echocardiogram was performed that showed no significant ischemia. He achieved a heart rate greater than 150. Cardiac enzymes were negative during his hospital stay.  He did have knee surgery since his last visit. He continues to smoke one pack per day. He has tried Chantix in the past though had severe vertigo. He does also have Raynaud's in his hands  PMH:   has a past medical history of Anxiety, BPH (benign prostatic hyperplasia), Current smoker, GERD (gastroesophageal reflux disease), and OA (osteoarthritis).  PSH:    Past Surgical History:  Procedure Laterality Date  . BACK SURGERY    . CARDIAC CATHETERIZATION  04/06/13   [facility]- minor luminal irregularities, otherwise normal cors; EF > 55%. Medical management and smoking cessation recommended  . KNEE ARTHROSCOPY W/ PARTIAL MEDIAL MENISCECTOMY Right 2012   [place], 2012  . NASAL SINUS SURGERY    . neck fusion    . VASECTOMY      Current Outpatient Medications  Medication Sig Dispense Refill  . acetaminophen (TYLENOL) 500 MG tablet Take 1,000 mg by mouth every 6 (six) hours as needed.      Marland Kitchen albuterol (PROVENTIL HFA;VENTOLIN HFA) 108 (90 Base) MCG/ACT inhaler Inhale 2 puffs into the lungs every 4 (four) hours as needed for wheezing. 1 Inhaler 0  . aspirin EC 81 MG tablet Take 81 mg by mouth daily.    Marland Kitchen  escitalopram (LEXAPRO) 10 MG tablet Take 1 tablet (10 mg total) by mouth daily. 90 tablet 1  . isosorbide mononitrate (IMDUR) 30 MG 24 hr tablet Take 1 tablet (30 mg total) by mouth daily. 90 tablet 3  . Multiple Vitamin (MULTIVITAMIN) tablet Take 1 tablet by mouth daily.      . nitroGLYCERIN (NITROSTAT) 0.4 MG SL tablet PLACE 1 TABLET UNDER THE TONGUE EVERY 5 MINUTES AS NEEDED FOR CHEST PAIN 25 tablet 0  . nitroGLYCERIN (NITROSTAT) 0.4 MG SL tablet  PLACE 1 TABLET UNDER THE TONGUE EVERY 5 MINUTES AS NEEDED FOR CHEST PAIN 25 tablet 0  . Omega-3 Fatty Acids (FISH OIL) 1000 MG CAPS Take 1 capsule by mouth daily.     No current facility-administered medications for this visit.      Allergies:   Patient has no known allergies.   Social History:  The patient  reports that he has been smoking cigarettes. He has a 15.00 pack-year smoking history. He has never used smokeless tobacco. He reports that he does not drink alcohol or use drugs.   Family History:   family history includes Arthritis in his father; CVA (age of onset: 80) in his father; Colon cancer in his mother; Coronary artery disease in his father; Coronary artery disease (age of onset: 91) in his brother; Heart attack in his brother and brother; Hypertension in his father.    Review of Systems: Review of Systems  Constitutional: Negative.   Respiratory: Negative.   Cardiovascular: Positive for chest pain.  Gastrointestinal: Negative.   Musculoskeletal: Negative.   Neurological: Negative.   Psychiatric/Behavioral: Negative.   All other systems reviewed and are negative.    PHYSICAL EXAM: VS:  There were no vitals taken for this visit. , BMI There is no height or weight on file to calculate BMI. GEN: Well nourished, well developed, in no acute distress  HEENT: normal  Neck: no JVD, carotid bruits, or masses Cardiac: RRR; no murmurs, rubs, or gallops,no edema  Respiratory:  clear to auscultation bilaterally, normal work of breathing GI: soft, nontender, nondistended, + BS MS: no deformity or atrophy  Skin: warm and dry, no rash Neuro:  Strength and sensation are intact Psych: euthymic mood, full affect    Recent Labs: 11/30/2017: ALT 54; BUN 14; Creatinine, Ser 1.21; Hemoglobin 14.5; Platelets 265; Potassium 4.5; Sodium 138    Lipid Panel Lab Results  Component Value Date   CHOL 174 06/17/2016   HDL 31.90 (L) 06/17/2016   LDLCALC 120 (H) 06/17/2016   TRIG 111.0  06/17/2016      Wt Readings from Last 3 Encounters:  11/30/17 142 lb (64.4 kg)  11/29/17 142 lb (64.4 kg)  11/27/17 141 lb 14.4 oz (64.4 kg)       ASSESSMENT AND PLAN:  Chest pain, unspecified chest pain type - Plan: EKG 12-Lead Chest pain Previously improved on isosorbide mononitrate, likely secondary to spasm Also improved with nitroglycerin Drop in blood pressure with 7 pound weight loss Suggested he try half pill of the isosorbide  Tobacco abuse We have encouraged him to continue to work on weaning his cigarettes and smoking cessation. He will continue to work on this and does not want any assistance with chantix.   Hyperlipidemia he does not want a statin at this time  Coronary artery disease involving native coronary artery of native heart without angina pectoris CT coronary calcium score is low, he does not want a statin He does not want a statin at this time  Recommended smoke cessation  Disposition:   F/U  12 months   Total encounter time more than 15 minutes  Greater than 50% was spent in counseling and coordination of care with the patient    No orders of the defined types were placed in this encounter.     Signed, Esmond Plants, M.D., Ph.D. 05/15/2018  [place], [place]

## 2018-05-16 ENCOUNTER — Ambulatory Visit: Payer: 59 | Admitting: Cardiovascular Disease

## 2018-05-21 NOTE — Progress Notes (Signed)
Patient ID: Isaiah Rangel, male   DOB: [birth year], 57 y.o.   MRN: [id number]   Cardiology Office Note  Date:  05/23/2018   ID:  Isaiah Rangel, DOB 1961-01-24, MRN [id number]  PCP:  Owens Loffler, MD   Chief Complaint  Patient presents with  . other    12 month follow up. Meds reviewed by the pt. verbally. Pt. c/o dizziness with change in positions, shortness of breath & rapid heart beats at times.     HPI:  57 year old gentleman who is very active at baseline who works as a Company secretary,  history of smoking Who continues to smoke   GERD,  remote neck injury with fusion surgery in November 30, 2000 with chronic neck pain and tightness,  chronic chest pain symptoms, relieved with nitroglycerin previous cardiac catheterization August 2014 showing minimal coronary artery disease,  CT coronary calcium score reviewed with him, score 135 presenting for follow-up of chest pain symptoms  In follow-up today he reports having significant weight loss over the past year  significant stress at home taking care of elderly father who is ill, father eventually passed away end of last year 11-30-2016 Used to be 155 pounds now 137 pounds He did hit a low 125 pounds This has caused orthostasis, baseline pressures in the 49-67 range systolic Working on the job often has to sit down secondary to orthostasis symptoms Reports he is staying hydrated but works in the hot sun Otherwise has been trying to stay very active  In the ER 11/2017 for chest pain and diarrhea Hospital records reviewed with the patient in detail diagnosed with enteritis on CT and discharged on antibiotics.  continued profuse and frequent diarrhea.  feeling lightheaded, developed chest pains around 4 AM, took a nitroglycerin tablet but the chest pain remains so he took a second nitroglycerin tablet at that time he had a syncopal episode.  Orthostatics done in the office 96 supine down to 83 standing with recovery up to 93 systolic after 3  minutes Heart rate from 60 up to 86 with standing  He continues to take isosorbide 30 mg in the morning Chronic chest pain typically in the morning better with isosorbide Does not want to stop the isosorbide despite his low blood pressure  previous CT scan chest, coronary calcifications  in the distal left main, proximal LAD ostial region  EKG personally reviewed by myself on todays visit Shows normal sinus rhythm with rate 74 bpm no significant ST or T-wave changes  Other past medical history Previous GI workup was essentially negative, had EGD In the past, Ran out of isosorbide, symptoms became worse , almost went to the emergency room.   reports he can have a good day with no symptoms, very active at work, moves [employer], very heavy items weighing several 100 pounds. Other days can be doing nothing with symptoms  Reports he has levsin, takes this occasionally, unclear if it helps  a stress echocardiogram was performed that showed no significant ischemia. He achieved a heart rate greater than 150. Cardiac enzymes were negative during his hospital stay.  He did have knee surgery since his last visit. He continues to smoke one pack per day. He has tried Chantix in the past though had severe vertigo. He does also have Raynaud's in his hands  PMH:   has a past medical history of Anxiety, BPH (benign prostatic hyperplasia), Current smoker, GERD (gastroesophageal reflux disease), and OA (osteoarthritis).  PSH:    Past Surgical History:  Procedure Laterality Date  . BACK SURGERY    . CARDIAC CATHETERIZATION  04/06/13   [facility]- minor luminal irregularities, otherwise normal cors; EF > 55%. Medical management and smoking cessation recommended  . KNEE ARTHROSCOPY W/ PARTIAL MEDIAL MENISCECTOMY Right 2012   [place], 2012  . NASAL SINUS SURGERY    . neck fusion    . VASECTOMY      Current Outpatient Medications  Medication Sig Dispense Refill  . acetaminophen (TYLENOL) 500 MG  tablet Take 1,000 mg by mouth every 6 (six) hours as needed.      Marland Kitchen albuterol (PROVENTIL HFA;VENTOLIN HFA) 108 (90 Base) MCG/ACT inhaler Inhale 2 puffs into the lungs every 4 (four) hours as needed for wheezing. 1 Inhaler 0  . aspirin EC 81 MG tablet Take 81 mg by mouth daily.    Marland Kitchen escitalopram (LEXAPRO) 10 MG tablet Take 1 tablet (10 mg total) by mouth daily. 90 tablet 1  . isosorbide mononitrate (IMDUR) 30 MG 24 hr tablet Take 1 tablet (30 mg total) by mouth daily. 90 tablet 3  . Multiple Vitamin (MULTIVITAMIN) tablet Take 1 tablet by mouth daily.      . nitroGLYCERIN (NITROSTAT) 0.4 MG SL tablet PLACE 1 TABLET UNDER THE TONGUE EVERY 5 MINUTES AS NEEDED FOR CHEST PAIN 25 tablet 0  . Omega-3 Fatty Acids (FISH OIL) 1000 MG CAPS Take 1 capsule by mouth daily.     No current facility-administered medications for this visit.      Allergies:   Eggs or egg-derived products   Social History:  The patient  reports that he has been smoking cigarettes. He has a 15.00 pack-year smoking history. He has never used smokeless tobacco. He reports that he does not drink alcohol or use drugs.   Family History:   family history includes Arthritis in his father; CVA (age of onset: 48) in his father; Colon cancer in his mother; Coronary artery disease in his father; Coronary artery disease (age of onset: 92) in his brother; Heart attack in his brother and brother; Hypertension in his father.    Review of Systems: Review of Systems  Constitutional: Negative.   Respiratory: Negative.   Cardiovascular: Positive for chest pain.  Gastrointestinal: Negative.   Musculoskeletal: Negative.   Neurological: Positive for dizziness.  Psychiatric/Behavioral: Negative.   All other systems reviewed and are negative.    PHYSICAL EXAM: VS:  BP (!) 80/52 (BP Location: Right Arm, Patient Position: Sitting, Cuff Size: Normal)   Pulse 74   Ht 5\' 5"  (1.651 m)   Wt 137 lb (62.1 kg)   BMI 22.80 kg/m  , BMI Body mass index  is 22.8 kg/m. Constitutional:  oriented to person, place, and time. No distress.  HENT:  Head: Normocephalic and atraumatic.  Eyes:  no discharge. No scleral icterus.  Neck: Normal range of motion. Neck supple. No JVD present.  Cardiovascular: Normal rate, regular rhythm, normal heart sounds and intact distal pulses. Exam reveals no gallop and no friction rub. No edema No murmur heard. Pulmonary/Chest: Effort normal and breath sounds normal. No stridor. No respiratory distress.  no wheezes.  no rales.  no tenderness.  Abdominal: Soft.  no distension.  no tenderness.  Musculoskeletal: Normal range of motion.  no  tenderness or deformity.  Neurological:  normal muscle tone. Coordination normal. No atrophy Skin: Skin is warm and dry. No rash noted. not diaphoretic.  Psychiatric:  normal mood and affect. behavior is normal. Thought content normal.   Recent Labs: 11/30/2017: ALT  54; BUN 14; Creatinine, Ser 1.21; Hemoglobin 14.5; Platelets 265; Potassium 4.5; Sodium 138    Lipid Panel Lab Results  Component Value Date   CHOL 174 06/17/2016   HDL 31.90 (L) 06/17/2016   LDLCALC 120 (H) 06/17/2016   TRIG 111.0 06/17/2016      Wt Readings from Last 3 Encounters:  05/23/18 137 lb (62.1 kg)  11/30/17 142 lb (64.4 kg)  11/29/17 142 lb (64.4 kg)    ASSESSMENT AND PLAN:  Chest pain, unspecified chest pain type - Plan: EKG 12-Lead Recommended he try Imdur the dinnertime her before bed  chest pains in the morning he takes Imdur in the morning Despite low blood pressure he does not want to stop medication  Tobacco abuse We have encouraged him to continue to work on weaning his cigarettes and smoking cessation. He will continue to work on this and does not want any assistance with chantix. Again discussed with him  Weight loss/protein calorie loss malnutrition 30 pound weight loss after loss of his father Still has not recovered much of the weight Causing  orthostasis  Orthostasis Staying hydrated but still having symptomatic orthostasis symptoms Discussed Florinef and midodrine He is requesting something to be done as he symptomatic Recommended he try Florinef 0.1 mg daily. May need to increase the dose after several weeks if no response. For now we'll try to avoid midodrine given his history of coronary spasm  Hyperlipidemia he does not want a statin at this time Again discussed with him  Coronary artery disease involving native coronary artery of native heart without angina pectoris CT coronary calcium score is low, he does not want a statin Recommended smoke cessation Continue Imdur for symptoms  Disposition:   F/U  3 months to follow-up on his blood pressure   Total encounter time more than 25 minutes  Greater than 50% was spent in counseling and coordination of care with the patient    Orders Placed This Encounter  Procedures  . EKG 12-Lead      Signed, Esmond Plants, M.D., Ph.D. 05/23/2018  [place], [place]

## 2018-05-23 ENCOUNTER — Encounter: Payer: Self-pay | Admitting: Cardiovascular Disease

## 2018-05-23 ENCOUNTER — Ambulatory Visit (INDEPENDENT_AMBULATORY_CARE_PROVIDER_SITE_OTHER): Payer: 59 | Admitting: Cardiovascular Disease

## 2018-05-23 VITALS — BP 80/52 | HR 74 | Ht 65.0 in | Wt 137.0 lb

## 2018-05-23 DIAGNOSIS — I951 Orthostatic hypotension: Secondary | ICD-10-CM

## 2018-05-23 DIAGNOSIS — E440 Moderate protein-calorie malnutrition: Secondary | ICD-10-CM

## 2018-05-23 DIAGNOSIS — I2510 Atherosclerotic heart disease of native coronary artery without angina pectoris: Secondary | ICD-10-CM

## 2018-05-23 DIAGNOSIS — G8929 Other chronic pain: Secondary | ICD-10-CM | POA: Diagnosis not present

## 2018-05-23 DIAGNOSIS — R079 Chest pain, unspecified: Secondary | ICD-10-CM | POA: Diagnosis not present

## 2018-05-23 DIAGNOSIS — F17200 Nicotine dependence, unspecified, uncomplicated: Secondary | ICD-10-CM | POA: Diagnosis not present

## 2018-05-23 DIAGNOSIS — E782 Mixed hyperlipidemia: Secondary | ICD-10-CM | POA: Diagnosis not present

## 2018-05-23 DIAGNOSIS — E46 Unspecified protein-calorie malnutrition: Secondary | ICD-10-CM | POA: Insufficient documentation

## 2018-05-23 MED ORDER — FLUDROCORTISONE ACETATE 0.1 MG PO TABS: 0.1000 mg | ORAL_TABLET | Freq: Every day | ORAL | 6 refills | Status: DC

## 2018-05-23 NOTE — Patient Instructions (Signed)
Medication Instructions:   Please start florinef one a day  Labwork:  No new labs needed  Testing/Procedures:  No further testing at this time   Follow-Up: It was a pleasure seeing you in the office today. Please call us if you have new issues that need to be addressed before your next appt.  [phone]  Your physician wants you to follow-up in: 3 months.    If you need a refill on your cardiac medications before your next appointment, please call your pharmacy.  For educational health videos Log in to : [email or web address] Or : [email or web address], password : [email or web address]

## 2018-06-03 ENCOUNTER — Telehealth: Payer: Self-pay | Admitting: Family Medicine

## 2018-06-03 NOTE — Telephone Encounter (Signed)
Best number [phone] Spouse Mitiz wanted to know Should pt get a flu this season because of high potential of egg allergy

## 2018-06-03 NOTE — Telephone Encounter (Signed)
Per Updates from 2019 [place] Immunization conference egg allergy is NOT a contraindication for flu vaccination.  Studies evaluated the use of flu vaccination in egg allergic children and no cases of anaphylaxis occurred.  Persons with history of egg allergy of any severity may receive an age appropriate influenza vaccine.  I will forward to Dr. Lorelei Pont for his input.

## 2018-06-05 NOTE — Telephone Encounter (Signed)
Please call  According to the CDC, he should get flu vaccine so long as his allergy to eggs is hives or rash alone.   Anaphylaxis, angioedema, or severe reaction to egg is an entirely different question.    These recommendations changed about 3 years ago - Mitzi should understand. If she has additional questions, let me know.

## 2018-06-06 NOTE — Telephone Encounter (Signed)
Spoke with Mitzi.  She states Mr. Murdaugh had severe intestinal distension reaction to eggs.  Discussed with Dr. Lorelei Pont and per Dr. Lorelei Pont he would not recommend that Mr. Franchini get the flu shot.  Mitzi states understanding.

## 2018-08-15 ENCOUNTER — Other Ambulatory Visit: Payer: Self-pay | Admitting: Family Medicine

## 2018-08-29 ENCOUNTER — Other Ambulatory Visit: Payer: Self-pay | Admitting: Family Medicine

## 2018-08-29 DIAGNOSIS — Z0000 Encounter for general adult medical examination without abnormal findings: Secondary | ICD-10-CM

## 2018-08-30 ENCOUNTER — Other Ambulatory Visit (INDEPENDENT_AMBULATORY_CARE_PROVIDER_SITE_OTHER): Payer: 59

## 2018-08-30 DIAGNOSIS — Z0000 Encounter for general adult medical examination without abnormal findings: Secondary | ICD-10-CM

## 2018-08-30 LAB — LIPID PANEL
Cholesterol: 175 mg/dL (ref 0–200)
HDL: 41.5 mg/dL (ref >39.00)
LDL Cholesterol: 117 mg/dL — ABNORMAL HIGH (ref 0–99)
NonHDL: 133.62
Total CHOL/HDL Ratio: 4
Triglycerides: 84 mg/dL (ref 0.0–149.0)
VLDL: 16.8 mg/dL (ref 0.0–40.0)

## 2018-08-30 LAB — BASIC METABOLIC PANEL
BUN: 13 mg/dL (ref 6–23)
CO2: 27 mEq/L (ref 19–32)
Calcium: 9.2 mg/dL (ref 8.4–10.5)
Chloride: 108 mEq/L (ref 96–112)
Creatinine, Ser: 0.89 mg/dL (ref 0.40–1.50)
GFR: 93.18 mL/min (ref >60.00)
Glucose, Bld: 107 mg/dL — ABNORMAL HIGH (ref 70–99)
Potassium: 4 mEq/L (ref 3.5–5.1)
Sodium: 141 mEq/L (ref 135–145)

## 2018-08-30 LAB — CBC WITH DIFFERENTIAL/PLATELET
Basophils Absolute: 0.1 10*3/uL (ref 0.0–0.1)
Basophils Relative: 0.8 % (ref 0.0–3.0)
Eosinophils Absolute: 0.3 10*3/uL (ref 0.0–0.7)
Eosinophils Relative: 3.5 % (ref 0.0–5.0)
HCT: 43.4 % (ref 39.0–52.0)
Hemoglobin: 14.6 g/dL (ref 13.0–17.0)
Lymphocytes Relative: 43 % (ref 12.0–46.0)
Lymphs Abs: 4.2 10*3/uL — ABNORMAL HIGH (ref 0.7–4.0)
MCHC: 33.5 g/dL (ref 30.0–36.0)
MCV: 95 fl (ref 78.0–100.0)
Monocytes Absolute: 0.8 10*3/uL (ref 0.1–1.0)
Monocytes Relative: 8.1 % (ref 3.0–12.0)
Neutro Abs: 4.4 10*3/uL (ref 1.4–7.7)
Neutrophils Relative %: 44.6 % (ref 43.0–77.0)
Platelets: 239 10*3/uL (ref 150.0–400.0)
RBC: 4.57 Mil/uL (ref 4.22–5.81)
RDW: 16.1 % — ABNORMAL HIGH (ref 11.5–15.5)
WBC: 9.7 10*3/uL (ref 4.0–10.5)

## 2018-08-30 LAB — PSA: PSA: 0.78 ng/mL (ref 0.10–4.00)

## 2018-08-30 LAB — HEPATIC FUNCTION PANEL
ALT: 13 U/L (ref 0–53)
AST: 15 U/L (ref 0–37)
Albumin: 4.4 g/dL (ref 3.5–5.2)
Alkaline Phosphatase: 73 U/L (ref 39–117)
Bilirubin, Direct: 0.1 mg/dL (ref 0.0–0.3)
Total Bilirubin: 0.3 mg/dL (ref 0.2–1.2)
Total Protein: 6.5 g/dL (ref 6.0–8.3)

## 2018-08-30 LAB — HEMOGLOBIN A1C: Hgb A1c MFr Bld: 6.3 % (ref 4.6–6.5)

## 2018-09-04 NOTE — Progress Notes (Signed)
Dr. [name] T. Raima Geathers, MD, [facility] [place], [address] Phone: [phone] Fax: [phone]  09/05/2018  Patient: Isaiah Rangel, MRN: [id number], DOB: 05/05/1961, 58 y.o.  Primary Physician:  Owens Loffler, MD   Chief Complaint  Patient presents with  . Annual Exam   Subjective:   Isaiah Rangel is a 58 y.o. pleasant patient who presents with the following:  Preventative Health Maintenance Visit:  Health Maintenance Summary Reviewed and updated, unless pt declines services.  Tobacco History Reviewed. Alcohol: No concerns, no excessive use Exercise Habits: Some activity, rec at least 30 mins 5 times a week STD concerns: no risk or activity to increase risk Drug Use: None Encouraged self-testicular check  Due for colonoscopy  Stress Smoking -   Spirometry  Health Maintenance  Topic Date Due  . COLONOSCOPY  07/02/2016  . TETANUS/TDAP  06/22/2026  . Hepatitis C Screening  Completed  . HIV Screening  Completed   Immunization History  Administered Date(s) Administered  . Influenza Whole 07/09/2010  . Influenza, Seasonal, Injecte, Preservative Fre 06/18/2015  . Influenza,inj,Quad PF,6+ Mos 06/22/2016  . Tdap 06/22/2016   Patient Active Problem List   Diagnosis Date Noted  . COPD (chronic obstructive pulmonary disease) with chronic bronchitis ([place]) 09/05/2018  . Orthostasis 05/23/2018  . Protein calorie malnutrition ([place]) 05/23/2018  . Acute ischemic enteritis ([place]) 11/26/2017  . Coronary artery disease involving native coronary artery of native heart without angina pectoris 02/21/2016  . Chronic chest pain 06/27/2015  . Cerebrovascular disease 04/29/2015  . Radicular pain of right lower back 12/05/2013  . S/P cardiac catheterization 04/19/2013  . Smoking 09/09/2011  . Mixed hyperlipidemia 04/09/2010  . GERD 09/21/2008  . ERECTILE DYSFUNCTION 09/20/2008   Past Medical History:  Diagnosis  Date  . Anxiety   . BPH (benign prostatic hyperplasia)   . Current smoker   . GERD (gastroesophageal reflux disease)   . OA (osteoarthritis)    Past Surgical History:  Procedure Laterality Date  . BACK SURGERY    . CARDIAC CATHETERIZATION  04/06/13   [facility]- minor luminal irregularities, otherwise normal cors; EF > 55%. Medical management and smoking cessation recommended  . KNEE ARTHROSCOPY W/ PARTIAL MEDIAL MENISCECTOMY Right 2012   [place], 2012  . NASAL SINUS SURGERY    . neck fusion    . VASECTOMY     Social History   Socioeconomic History  . Marital status: Married    Spouse name: Not on file  . Number of children: Not on file  . Years of education: Not on file  . Highest education level: Not on file  Occupational History  . Occupation: [employer]  . Financial resource strain: Not on file  . Food insecurity:    Worry: Not on file    Inability: Not on file  . Transportation needs:    Medical: Not on file    Non-medical: Not on file  Tobacco Use  . Smoking status: Current Every Day Smoker    Packs/day: 0.50    Years: 30.00    Pack years: 15.00    Types: Cigarettes  . Smokeless tobacco: Never Used  . Tobacco comment: he is aware he needs to quit   Substance and Sexual Activity  . Alcohol use: No    Alcohol/week: 0.0 standard drinks  . Drug use: No  . Sexual activity: Yes    Partners: Female  Lifestyle  . Physical activity:  Days per week: Not on file    Minutes per session: Not on file  . Stress: Not on file  Relationships  . Social connections:    Talks on phone: Not on file    Gets together: Not on file    Attends religious service: Not on file    Active member of club or organization: Not on file    Attends meetings of clubs or organizations: Not on file    Relationship status: Not on file  . Intimate partner violence:    Fear of current or ex partner: Not on file    Emotionally abused: Not on file    Physically abused: Not on file     Forced sexual activity: Not on file  Other Topics Concern  . Not on file  Social History Narrative   Son of Barnabas Lister Choate([employer])      Likes to work out       Lives with GF   Family History  Problem Relation Age of Onset  . Arthritis Father   . Coronary artery disease Father   . Hypertension Father   . CVA Father 30  . Colon cancer Mother   . Heart attack Brother   . Heart attack Brother   . Coronary artery disease Brother 49  . Esophageal cancer Neg Hx   . Stomach cancer Neg Hx    Allergies  Allergen Reactions  . Eggs Or Egg-Derived Products     Abdominal pain  Vomiting  Sweats and chills      Medication list has been reviewed and updated.   General: Denies fever, chills, sweats. No significant weight loss. Eyes: Denies blurring,significant itching ENT: Denies earache, sore throat, and hoarseness. Cardiovascular: Denies chest pains, palpitations, dyspnea on exertion Respiratory: SOB WITH EXERTION Breast: no concerns about lumps GI: Denies nausea, vomiting, diarrhea, constipation, change in bowel habits, abdominal pain, melena, hematochezia GU: Denies penile discharge, ED, urinary flow / outflow problems. No STD concerns. Musculoskeletal: Denies back pain, joint pain Derm: Denies rash, itching Neuro: Denies  paresthesias, frequent falls, frequent headaches Psych: Denies depression, anxiety Endocrine: Denies cold intolerance, heat intolerance, polydipsia Heme: Denies enlarged lymph nodes Allergy: No hayfever  Objective:   BP 110/60   Pulse 86   Temp 98.3 F (36.8 C) (Oral)   Ht '5\' 5"'$  (1.651 m)   Wt 133 lb 8 oz (60.6 kg)   SpO2 94%   BMI 22.22 kg/m  Ideal Body Weight: Weight in (lb) to have BMI = 25: 149.9  No exam data present  GEN: well developed, well nourished, no acute distress Eyes: conjunctiva and lids normal, PERRLA, EOMI ENT: TM clear, nares clear, oral exam WNL Neck: supple, no lymphadenopathy, no thyromegaly, no JVD Pulm: clear to  auscultation and percussion, respiratory effort normal CV: regular rate and rhythm, S1-S2, no murmur, rub or gallop, no bruits, peripheral pulses normal and symmetric, no cyanosis, clubbing, edema or varicosities GI: soft, non-tender; no hepatosplenomegaly, masses; active bowel sounds all quadrants GU: no hernia, testicular mass, penile discharge Lymph: no cervical, axillary or inguinal adenopathy MSK: gait normal, muscle tone and strength WNL, no joint swelling, effusions, discoloration, crepitus  SKIN: clear, good turgor, color WNL, no rashes, lesions, or ulcerations Neuro: normal mental status, normal strength, sensation, and motion Psych: alert; oriented to person, place and time, normally interactive and not anxious or depressed in appearance.  All labs reviewed with patient.  Lipids: Lab Results  Component Value Date   CHOL 175 08/30/2018  Lab Results  Component Value Date   HDL 41.50 08/30/2018   Lab Results  Component Value Date   LDLCALC 117 (H) 08/30/2018   Lab Results  Component Value Date   TRIG 84.0 08/30/2018   Lab Results  Component Value Date   CHOLHDL 4 08/30/2018   CBC: CBC Latest Ref Rng & Units 08/30/2018 11/30/2017 11/29/2017  WBC 4.0 - 10.5 K/uL 9.7 13.3(H) 10.5  Hemoglobin 13.0 - 17.0 g/dL 14.6 14.5 14.2  Hematocrit 39.0 - 52.0 % 43.4 43.4 41.9  Platelets 150.0 - 400.0 K/uL 239.0 265 287.8    Basic Metabolic Panel:    Component Value Date/Time   NA 141 08/30/2018 0747   NA 142 03/29/2013 0020   K 4.0 08/30/2018 0747   K 3.6 03/29/2013 0020   CL 108 08/30/2018 0747   CL 110 (H) 03/29/2013 0020   CO2 27 08/30/2018 0747   CO2 29 03/29/2013 0020   BUN 13 08/30/2018 0747   BUN 16 03/29/2013 0020   CREATININE 0.89 08/30/2018 0747   CREATININE 1.13 03/29/2013 0020   GLUCOSE 107 (H) 08/30/2018 0747   GLUCOSE 130 (H) 03/29/2013 0020   CALCIUM 9.2 08/30/2018 0747   CALCIUM 8.5 03/29/2013 0020   Hepatic Function Latest Ref Rng & Units 08/30/2018  11/30/2017 11/25/2017  Total Protein 6.0 - 8.3 g/dL 6.5 7.3 7.4  Albumin 3.5 - 5.2 g/dL 4.4 4.2 4.3  AST 0 - 37 U/L 15 60(H) 24  ALT 0 - 53 U/L 13 54 17  Alk Phosphatase 39 - 117 U/L 73 71 74  Total Bilirubin 0.2 - 1.2 mg/dL 0.3 0.5 0.6  Bilirubin, Direct 0.0 - 0.3 mg/dL 0.1 - -    Lab Results  Component Value Date   HGBA1C 6.3 08/30/2018   Lab Results  Component Value Date   TSH 1.40 06/26/2015   Lab Results  Component Value Date   PSA 0.78 08/30/2018   PSA 1.38 06/17/2016   PSA 0.83 02/23/2013    Assessment and Plan:   Healthcare maintenance  Screen for colon cancer - Plan: Ambulatory referral to Gastroenterology  Family history of colon cancer in mother - Plan: Ambulatory referral to Gastroenterology  SOB (shortness of breath) - Plan: Spirometry with graph, DG Chest 2 View  COPD (chronic obstructive pulmonary disease) with chronic bronchitis (HCC)  FEV1/FVC 55%, start symbicort, f/u email 1 mo  Health Maintenance Exam: The patient's preventative maintenance and recommended screening tests for an annual wellness exam were reviewed in full today. Brought up to date unless services declined.  Counselled on the importance of diet, exercise, and its role in overall health and mortality. The patient's FH and SH was reviewed, including their home life, tobacco status, and drug and alcohol status.  Follow-up in 1 year for physical exam or additional follow-up below.  Follow-up: No follow-ups on file. Or follow-up in 1 year if not noted.  Signed,  Maud Deed. Gregoire Bennis, MD   Allergies as of 09/05/2018      Reactions   Eggs Or Egg-derived Products    Abdominal pain  Vomiting  Sweats and chills       Medication List       Accurate as of September 05, 2018 10:58 AM. Always use your most recent med list.        acetaminophen 500 MG tablet Commonly known as:  TYLENOL Take 1,000 mg by mouth every 6 (six) hours as needed.   albuterol 108 (90 Base) MCG/ACT  inhaler Commonly known  as:  PROVENTIL HFA;VENTOLIN HFA Inhale 2 puffs into the lungs every 4 (four) hours as needed for wheezing.   aspirin EC 81 MG tablet Take 81 mg by mouth daily.   budesonide-formoterol 160-4.5 MCG/ACT inhaler Commonly known as:  SYMBICORT Inhale 2 puffs into the lungs 2 (two) times daily.   escitalopram 10 MG tablet Commonly known as:  LEXAPRO TAKE 1 TABLET(10 MG) BY MOUTH DAILY   Fish Oil 1000 MG Caps Take 1 capsule by mouth daily.   fludrocortisone 0.1 MG tablet Commonly known as:  FLORINEF Take 1 tablet (0.1 mg total) by mouth daily.   isosorbide mononitrate 30 MG 24 hr tablet Commonly known as:  IMDUR Take 1 tablet (30 mg total) by mouth daily.   multivitamin tablet Take 1 tablet by mouth daily.   nitroGLYCERIN 0.4 MG SL tablet Commonly known as:  NITROSTAT PLACE 1 TABLET UNDER THE TONGUE EVERY 5 MINUTES AS NEEDED FOR CHEST PAIN

## 2018-09-05 ENCOUNTER — Ambulatory Visit (INDEPENDENT_AMBULATORY_CARE_PROVIDER_SITE_OTHER)
Admission: RE | Admit: 2018-09-05 | Discharge: 2018-09-05 | Disposition: A | Discharge status: Home / Self Care | Payer: 59 | Source: Ambulatory Visit | Attending: Family Medicine | Admitting: Family Medicine

## 2018-09-05 ENCOUNTER — Encounter: Payer: Self-pay | Admitting: Family Medicine

## 2018-09-05 ENCOUNTER — Ambulatory Visit (INDEPENDENT_AMBULATORY_CARE_PROVIDER_SITE_OTHER): Payer: 59 | Admitting: Family Medicine

## 2018-09-05 ENCOUNTER — Encounter: Payer: 59 | Admitting: Family Medicine

## 2018-09-05 VITALS — BP 110/60 | HR 86 | Temp 98.3°F | Ht 65.0 in | Wt 133.5 lb

## 2018-09-05 DIAGNOSIS — Z1211 Encounter for screening for malignant neoplasm of colon: Secondary | ICD-10-CM

## 2018-09-05 DIAGNOSIS — R0602 Shortness of breath: Secondary | ICD-10-CM

## 2018-09-05 DIAGNOSIS — Z0000 Encounter for general adult medical examination without abnormal findings: Secondary | ICD-10-CM

## 2018-09-05 DIAGNOSIS — Z800 Family history of malignant neoplasm of digestive organs: Secondary | ICD-10-CM | POA: Diagnosis not present

## 2018-09-05 DIAGNOSIS — J984 Other disorders of lung: Secondary | ICD-10-CM | POA: Diagnosis not present

## 2018-09-05 DIAGNOSIS — J449 Chronic obstructive pulmonary disease, unspecified: Secondary | ICD-10-CM

## 2018-09-05 MED ORDER — BUDESONIDE-FORMOTEROL FUMARATE 160-4.5 MCG/ACT IN AERO: 2.0000 | INHALATION_SPRAY | Freq: Two times a day (BID) | RESPIRATORY_TRACT | 3 refills | Status: DC

## 2018-09-08 ENCOUNTER — Telehealth: Payer: Self-pay | Admitting: Family Medicine

## 2018-09-08 NOTE — Telephone Encounter (Signed)
Pt dropped off physical form to be filled out. Placed in [facility] tower.

## 2018-09-08 NOTE — Telephone Encounter (Signed)
Form completed and placed in Dr. Lillie Fragmin in box for signature.

## 2018-09-08 NOTE — Telephone Encounter (Signed)
Will complete 

## 2018-09-12 NOTE — Telephone Encounter (Signed)
Form completed and faxed to [facility] at [phone].  Mr. Depaulo notified by telephone that form was completed and faxed to [place].

## 2018-09-26 DIAGNOSIS — D225 Melanocytic nevi of trunk: Secondary | ICD-10-CM | POA: Diagnosis not present

## 2018-09-26 DIAGNOSIS — D2262 Melanocytic nevi of left upper limb, including shoulder: Secondary | ICD-10-CM | POA: Diagnosis not present

## 2018-09-26 DIAGNOSIS — D2261 Melanocytic nevi of right upper limb, including shoulder: Secondary | ICD-10-CM | POA: Diagnosis not present

## 2018-09-29 ENCOUNTER — Ambulatory Visit (INDEPENDENT_AMBULATORY_CARE_PROVIDER_SITE_OTHER): Payer: 59 | Admitting: Family Medicine

## 2018-09-29 ENCOUNTER — Encounter: Payer: Self-pay | Admitting: Family Medicine

## 2018-09-29 VITALS — BP 120/60 | HR 72 | Temp 97.6°F | Ht 65.0 in | Wt 134.2 lb

## 2018-09-29 DIAGNOSIS — J181 Lobar pneumonia, unspecified organism: Secondary | ICD-10-CM | POA: Diagnosis not present

## 2018-09-29 DIAGNOSIS — J189 Pneumonia, unspecified organism: Secondary | ICD-10-CM

## 2018-09-29 DIAGNOSIS — R509 Fever, unspecified: Secondary | ICD-10-CM

## 2018-09-29 DIAGNOSIS — J4489 Other specified chronic obstructive pulmonary disease: Secondary | ICD-10-CM

## 2018-09-29 DIAGNOSIS — J449 Chronic obstructive pulmonary disease, unspecified: Secondary | ICD-10-CM | POA: Diagnosis not present

## 2018-09-29 LAB — POC INFLUENZA A&B (BINAX/QUICKVUE)
Influenza A, POC: NEGATIVE
Influenza B, POC: NEGATIVE

## 2018-09-29 MED ORDER — CEFDINIR 300 MG PO CAPS: 600.0000 mg | ORAL_CAPSULE | Freq: Every day | ORAL | 0 refills | Status: DC

## 2018-09-29 MED ORDER — PREDNISONE 20 MG PO TABS: ORAL_TABLET | ORAL | 0 refills | Status: DC

## 2018-09-29 NOTE — Progress Notes (Signed)
Dr. [name] T. Lavarr President, MD, [facility] [place], [address] Phone: [phone] Fax: [phone]  09/29/2018  Patient: Isaiah Rangel, MRN: [id number], DOB: [birth year], 58 y.o.  Primary Physician:  Owens Loffler, MD   Chief Complaint  Patient presents with  . Fever  . Chills  . Cough  . Generalized Body Aches  . Nasal Congestion   Subjective:   Isaiah Rangel is a 58 y.o. very pleasant male patient who presents with the following:  Tues cold chills, fever, over 100 Temp. Having a lot of shortness of breath.  Coughing a ton.  He generally feels pretty terrible, he does have some generalized aches, but he has a lot of joint pain in general, and is not sure if this deviates from his baseline.  He has had some runny nose and congestion as well as a very productive wet cough.  Feels like he has been worsening over time.  Sold his dad's house and done a lot of moving.  Some knee pain.   RLL pneumonia  Past Medical History, Surgical History, Social History, Family History, Problem List, Medications, and Allergies have been reviewed and updated if relevant.  Patient Active Problem List   Diagnosis Date Noted  . COPD (chronic obstructive pulmonary disease) with chronic bronchitis ([place]) 09/05/2018  . Orthostasis 05/23/2018  . Protein calorie malnutrition ([place]) 05/23/2018  . Acute ischemic enteritis ([place]) 11/26/2017  . Coronary artery disease involving native coronary artery of native heart without angina pectoris 02/21/2016  . Chronic chest pain 06/27/2015  . Cerebrovascular disease 04/29/2015  . Radicular pain of right lower back 12/05/2013  . S/P cardiac catheterization 04/19/2013  . Smoking 09/09/2011  . Mixed hyperlipidemia 04/09/2010  . GERD 09/21/2008  . ERECTILE DYSFUNCTION 09/20/2008    Past Medical History:  Diagnosis Date  . Anxiety   . BPH (benign prostatic hyperplasia)   . Current smoker   .  GERD (gastroesophageal reflux disease)   . OA (osteoarthritis)     Past Surgical History:  Procedure Laterality Date  . BACK SURGERY    . CARDIAC CATHETERIZATION  04/06/13   [facility]- minor luminal irregularities, otherwise normal cors; EF > 55%. Medical management and smoking cessation recommended  . KNEE ARTHROSCOPY W/ PARTIAL MEDIAL MENISCECTOMY Right 2012   [place], 2012  . NASAL SINUS SURGERY    . neck fusion    . VASECTOMY      Social History   Socioeconomic History  . Marital status: Married    Spouse name: Not on file  . Number of children: Not on file  . Years of education: Not on file  . Highest education level: Not on file  Occupational History  . Occupation: [employer]  . Financial resource strain: Not on file  . Food insecurity:    Worry: Not on file    Inability: Not on file  . Transportation needs:    Medical: Not on file    Non-medical: Not on file  Tobacco Use  . Smoking status: Current Every Day Smoker    Packs/day: 0.50    Years: 30.00    Pack years: 15.00    Types: Cigarettes  . Smokeless tobacco: Never Used  . Tobacco comment: he is aware he needs to quit   Substance and Sexual Activity  . Alcohol use: No    Alcohol/week: 0.0 standard drinks  . Drug use: No  . Sexual activity: Yes  Partners: Female  Lifestyle  . Physical activity:    Days per week: Not on file    Minutes per session: Not on file  . Stress: Not on file  Relationships  . Social connections:    Talks on phone: Not on file    Gets together: Not on file    Attends religious service: Not on file    Active member of club or organization: Not on file    Attends meetings of clubs or organizations: Not on file    Relationship status: Not on file  . Intimate partner violence:    Fear of current or ex partner: Not on file    Emotionally abused: Not on file    Physically abused: Not on file    Forced sexual activity: Not on file  Other Topics Concern  . 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 Arthritis Father   . Coronary artery disease Father   . Hypertension Father   . CVA Father 66  . Colon cancer Mother   . Heart attack Brother   . Heart attack Brother   . Coronary artery disease Brother 65  . Esophageal cancer Neg Hx   . Stomach cancer Neg Hx     Allergies  Allergen Reactions  . Eggs Or Egg-Derived Products     Abdominal pain  Vomiting  Sweats and chills      Medication list reviewed and updated in full in [place].  ROS: GEN: Acute illness details above GI: Tolerating PO intake GU: maintaining adequate hydration and urination Pulm: cough and some SOB Interactive and getting along well at home.  Otherwise, ROS is as per the HPI.  Objective:   BP 120/60   Pulse 72   Temp 97.6 F (36.4 C) (Oral)   Ht 5\' 5"  (1.651 m)   Wt 134 lb 4 oz (60.9 kg)   SpO2 97%   BMI 22.34 kg/m    GEN: A and O x 3. WDWN. NAD.    ENT: Nose clear, ext NML.  No LAD.  No JVD.  TM's clear. Oropharynx clear.  PULM: Normal WOB, no distress.  Right lower lobe has focal crackles, but there are no significant rhonchi or wheezing. CV: RRR, no M/G/R, No rubs, No JVD.   EXT: warm and well-perfused, No c/c/e. PSYCH: Pleasant and conversant.    Laboratory and Imaging Data: Results for orders placed or performed in visit on 09/29/18  POC Influenza A&B ([facility] test)  Result Value Ref Range   Influenza A, POC Negative Negative   Influenza B, POC Negative Negative     Assessment and Plan:   Pneumonia of right lower lobe due to infectious organism ([place])  Fever, unspecified fever cause - Plan: POC Influenza A&B (Binax test)  COPD (chronic obstructive pulmonary disease) with chronic bronchitis ([place])  History and exam is consistent with a right lower lobe pneumonia.  His flu test is negative.  I am in a place  him on some Omnicef as well as some prednisone.  Follow-up: No follow-ups on file.  Meds ordered this encounter  Medications  . cefdinir (OMNICEF) 300 MG capsule    Sig: Take 2 capsules (600 mg total) by mouth daily.    Dispense:  20 capsule  Refill:  0  . predniSONE (DELTASONE) 20 MG tablet    Sig: 2 tabs po for 5 days, then 1 tab po for 5 days    Dispense:  15 tablet    Refill:  0   Orders Placed This Encounter  Procedures  . POC Influenza A&B (Binax test)    Signed,  Holmes Hays T. Saniyah Mondesir, MD   Outpatient Encounter Medications as of 09/29/2018  Medication Sig  . acetaminophen (TYLENOL) 500 MG tablet Take 1,000 mg by mouth every 6 (six) hours as needed.    Marland Kitchen albuterol (PROVENTIL HFA;VENTOLIN HFA) 108 (90 Base) MCG/ACT inhaler Inhale 2 puffs into the lungs every 4 (four) hours as needed for wheezing.  Marland Kitchen aspirin EC 81 MG tablet Take 81 mg by mouth daily.  . budesonide-formoterol (SYMBICORT) 160-4.5 MCG/ACT inhaler Inhale 2 puffs into the lungs 2 (two) times daily.  Marland Kitchen escitalopram (LEXAPRO) 10 MG tablet TAKE 1 TABLET(10 MG) BY MOUTH DAILY  . fludrocortisone (FLORINEF) 0.1 MG tablet Take 1 tablet (0.1 mg total) by mouth daily.  . isosorbide mononitrate (IMDUR) 30 MG 24 hr tablet Take 1 tablet (30 mg total) by mouth daily.  . Multiple Vitamin (MULTIVITAMIN) tablet Take 1 tablet by mouth daily.    . nitroGLYCERIN (NITROSTAT) 0.4 MG SL tablet PLACE 1 TABLET UNDER THE TONGUE EVERY 5 MINUTES AS NEEDED FOR CHEST PAIN  . Omega-3 Fatty Acids (FISH OIL) 1000 MG CAPS Take 1 capsule by mouth daily.  . cefdinir (OMNICEF) 300 MG capsule Take 2 capsules (600 mg total) by mouth daily.  . predniSONE (DELTASONE) 20 MG tablet 2 tabs po for 5 days, then 1 tab po for 5 days   No facility-administered encounter medications on file as of 09/29/2018.

## 2018-10-24 ENCOUNTER — Encounter: Payer: Self-pay | Admitting: Gastroenterology

## 2018-11-11 ENCOUNTER — Encounter: Payer: Self-pay | Admitting: Gastroenterology

## 2018-11-11 ENCOUNTER — Other Ambulatory Visit: Payer: Self-pay

## 2018-11-11 ENCOUNTER — Ambulatory Visit (AMBULATORY_SURGERY_CENTER): Payer: Self-pay

## 2018-11-11 ENCOUNTER — Telehealth: Payer: Self-pay

## 2018-11-11 VITALS — Ht 65.0 in | Wt 142.6 lb

## 2018-11-11 DIAGNOSIS — Z860100 Personal history of colon polyps, unspecified: Secondary | ICD-10-CM

## 2018-11-11 MED ORDER — NA SULFATE-K SULFATE-MG SULF 17.5-3.13-1.6 GM/177ML PO SOLN: 1.0000 | Freq: Once | ORAL | 0 refills | Status: AC

## 2018-11-11 NOTE — Progress Notes (Signed)
Denies allergies to eggs or soy products. Denies complication of anesthesia or sedation. Denies use of weight loss medication. Denies use of O2.   Emmi instructions declined.   Patient states that he is very allergic to eggs. The last two times that he ate them he ended up in the ER with swelling.   Patients temperature was 99. Patient denies international travel and states that he has not experienced any flu symptoms.   A pay no more than 50.00 coupon for Suprep was given to the patient.

## 2018-11-11 NOTE — Telephone Encounter (Signed)
I saw Isaiah Rangel in [facility] this morning. He is scheduled for a colonoscopy with Dr. Fuller Plan on 11/25/18. During PV he mentioned that he was allergic to eggs. Deaundre said that the last two times that he ate eggs he ended up in the ER with severe swelling. I wanted to make sure that you were aware of the reaction that he had to eggs. Evidently this is a new event because he has eaten eggs for many years.   Riki Sheer, LPN ( PV )

## 2018-11-13 NOTE — Telephone Encounter (Signed)
Egg allergy noted

## 2018-11-14 NOTE — Telephone Encounter (Signed)
Izora Gala,  Aware of egg allergy  Thanks,  Osvaldo Angst

## 2018-11-25 ENCOUNTER — Encounter: Payer: 59 | Admitting: Gastroenterology

## 2018-12-01 ENCOUNTER — Telehealth: Payer: Self-pay | Admitting: *Deleted

## 2018-12-01 NOTE — Telephone Encounter (Signed)
Dr. Fuller Plan,  I called to defer this patient per your request. I spoke with the patient's wife, who is a nurse, who asked how we were determining how we cancelled patients. I explained non-emergent cases were being asked to reschedule after review from the physician.  She expressed that her husband has a family hx of colon cancer in his mother (she is deceased from colon cancer). The mother never had symptoms other than back pain. She states that her husband has chronic back pain and has even had surgery on his back but his back pain has increased recently. She also stated that he had a lot of polyps removed on last colon. Last colon was 07/2011 with a 5 year recall.   She respects whatever decision is made but would like his case and scheduled procedure to be reconsidered.  Please advise.  Thank you, Olivia Mackie

## 2018-12-01 NOTE — Telephone Encounter (Signed)
Will make exception given their concerns however since it is an exception that I agreed to this must be scheduled on one of my [facility] procedure days.  I know I have openings on 4/9.  Not sure about what other days are actually set for me in the [facility] however please check if 4/9 is not acceptable to them.

## 2018-12-01 NOTE — Telephone Encounter (Signed)
Phone call back to wife to notify of Dr. Silvio Pate message, rescheduled patient to 1130 time on the same date. Reviewed instructions with wife.

## 2018-12-12 ENCOUNTER — Telehealth: Payer: Self-pay | Admitting: *Deleted

## 2018-12-12 NOTE — Telephone Encounter (Signed)
Covid-19 travel screening questions  Have you traveled in the last 14 days? No If yes where?  Do you now or have you had a fever in the last 14 days? No  Do you have any respiratory symptoms of shortness of breath or cough now or in the last 14 days? No  Do you have any family members or close contacts with diagnosed or suspected Covid-19? No       

## 2018-12-13 ENCOUNTER — Encounter: Payer: Self-pay | Admitting: Gastroenterology

## 2018-12-13 ENCOUNTER — Other Ambulatory Visit: Payer: Self-pay

## 2018-12-13 ENCOUNTER — Encounter: Payer: 59 | Admitting: Gastroenterology

## 2018-12-13 ENCOUNTER — Ambulatory Visit (AMBULATORY_SURGERY_CENTER): Payer: 59 | Admitting: Gastroenterology

## 2018-12-13 VITALS — BP 106/77 | HR 16 | Temp 97.5°F | Resp 18 | Ht 65.0 in | Wt 142.0 lb

## 2018-12-13 DIAGNOSIS — K635 Polyp of colon: Secondary | ICD-10-CM

## 2018-12-13 DIAGNOSIS — K621 Rectal polyp: Secondary | ICD-10-CM

## 2018-12-13 DIAGNOSIS — Z86010 Personal history of colonic polyps: Secondary | ICD-10-CM | POA: Diagnosis not present

## 2018-12-13 DIAGNOSIS — Z800 Family history of malignant neoplasm of digestive organs: Secondary | ICD-10-CM | POA: Diagnosis not present

## 2018-12-13 DIAGNOSIS — Z1211 Encounter for screening for malignant neoplasm of colon: Secondary | ICD-10-CM

## 2018-12-13 DIAGNOSIS — D125 Benign neoplasm of sigmoid colon: Secondary | ICD-10-CM

## 2018-12-13 DIAGNOSIS — D127 Benign neoplasm of rectosigmoid junction: Secondary | ICD-10-CM | POA: Diagnosis not present

## 2018-12-13 DIAGNOSIS — D128 Benign neoplasm of rectum: Secondary | ICD-10-CM

## 2018-12-13 MED ORDER — SODIUM CHLORIDE 0.9 % IV SOLN: 500.0000 mL | Freq: Once | INTRAVENOUS | Status: DC

## 2018-12-13 NOTE — Progress Notes (Signed)
Called to room to assist during endoscopic procedure.  Patient ID and intended procedure confirmed with present staff. Received instructions for my participation in the procedure from the performing physician.  

## 2018-12-13 NOTE — Op Note (Signed)
[facility] Patient Name: Isaiah Rangel Procedure Date: 12/13/2018 11:25 AM MRN: [id number] Endoscopist: Ladene Artist , MD Age: 58 Referring MD:  Date of Birth: [birth year] Gender: Male Account #: [id number] Procedure:                Colonoscopy Indications:              Screening in patient at increased risk: Family                            history of 1st-degree relative with colorectal                            cancer Medicines:                Monitored Anesthesia Care Procedure:                Pre-Anesthesia Assessment:                           - Prior to the procedure, a History and Physical                            was performed, and patient medications and                            allergies were reviewed. The patient's tolerance of                            previous anesthesia was also reviewed. The risks                            and benefits of the procedure and the sedation                            options and risks were discussed with the patient.                            All questions were answered, and informed consent                            was obtained. Prior Anticoagulants: The patient has                            taken no previous anticoagulant or antiplatelet                            agents. ASA Grade Assessment: II - A patient with                            mild systemic disease. After reviewing the risks                            and benefits, the patient was deemed in  satisfactory condition to undergo the procedure.                           After obtaining informed consent, the colonoscope                            was passed under direct vision. Throughout the                            procedure, the patient's blood pressure, pulse, and                            oxygen saturations were monitored continuously. The                            Colonoscope was introduced through the anus and                  advanced to the the cecum, identified by                            appendiceal orifice and ileocecal valve. The                            ileocecal valve, appendiceal orifice, and rectum                            were photographed. The quality of the bowel                            preparation was excellent. The colonoscopy was                            performed without difficulty. The patient tolerated                            the procedure well. Scope In: 11:33:05 AM Scope Out: 11:55:55 AM Scope Withdrawal Time: 0 hours 18 minutes 47 seconds  Total Procedure Duration: 0 hours 22 minutes 50 seconds  Findings:                 The perianal and digital rectal examinations were                            normal.                           Three sessile polyps were found in the rectum (1)                            and sigmoid colon (2). The polyps were 6 to 7 mm in                            size. These polyps were removed with a cold snare.  Resection and retrieval were complete.                           Internal hemorrhoids were found during                            retroflexion. The hemorrhoids were medium-sized and                            Grade I (internal hemorrhoids that do not prolapse).                           The exam was otherwise without abnormality on                            direct and retroflexion views. Complications:            No immediate complications. Estimated blood loss:                            None. Estimated Blood Loss:     Estimated blood loss: none. Impression:               - Three 6 to 7 mm polyps in the rectum and in the                            sigmoid colon, removed with a cold snare. Resected                            and retrieved.                           - Internal hemorrhoids.                           - The examination was otherwise normal on direct                            and  retroflexion views. Recommendation:           - Repeat colonoscopy in 5 years for screening                            purposes.                           - Patient has a contact number available for                            emergencies. The signs and symptoms of potential                            delayed complications were discussed with the                            patient. Return to normal activities tomorrow.  Written discharge instructions were provided to the                            patient.                           - Resume previous diet.                           - Continue present medications.                           - Await pathology results. Ladene Artist, MD 12/13/2018 11:59:25 AM This report has been signed electronically.

## 2018-12-13 NOTE — Patient Instructions (Signed)
Handouts given on polyps and internal hemorrhoids.   YOU HAD AN ENDOSCOPIC PROCEDURE TODAY AT [facility]:   Refer to the procedure report that was given to you for any specific questions about what was found during the examination.  If the procedure report does not answer your questions, please call your gastroenterologist to clarify.  If you requested that your care partner not be given the details of your procedure findings, then the procedure report has been included in a sealed envelope for you to review at your convenience later.  YOU SHOULD EXPECT: Some feelings of bloating in the abdomen. Passage of more gas than usual.  Walking can help get rid of the air that was put into your GI tract during the procedure and reduce the bloating. If you had a lower endoscopy (such as a colonoscopy or flexible sigmoidoscopy) you may notice spotting of blood in your stool or on the toilet paper. If you underwent a bowel prep for your procedure, you may not have a normal bowel movement for a few days.  Please Note:  You might notice some irritation and congestion in your nose or some drainage.  This is from the oxygen used during your procedure.  There is no need for concern and it should clear up in a day or so.  SYMPTOMS TO REPORT IMMEDIATELY:   Following lower endoscopy (colonoscopy or flexible sigmoidoscopy):  Excessive amounts of blood in the stool  Significant tenderness or worsening of abdominal pains  Swelling of the abdomen that is new, acute  Fever of 100F or higher   For urgent or emergent issues, a gastroenterologist can be reached at any hour by calling [phone].   DIET:  We do recommend a small meal at first, but then you may proceed to your regular diet.  Drink plenty of fluids but you should avoid alcoholic beverages for 24 hours.  ACTIVITY:  You should plan to take it easy for the rest of today and you should NOT DRIVE or use heavy machinery until tomorrow  (because of the sedation medicines used during the test).    FOLLOW UP: Our staff will call the number listed on your records the next business day following your procedure to check on you and address any questions or concerns that you may have regarding the information given to you following your procedure. If we do not reach you, we will leave a message.  However, if you are feeling well and you are not experiencing any problems, there is no need to return our call.  We will assume that you have returned to your regular daily activities without incident.  If any biopsies were taken you will be contacted by phone or by letter within the next 1-3 weeks.  Please call us at [phone] if you have not heard about the biopsies in 3 weeks.    SIGNATURES/CONFIDENTIALITY: You and/or your care partner have signed paperwork which will be entered into your electronic medical record.  These signatures attest to the fact that that the information above on your After Visit Summary has been reviewed and is understood.  Full responsibility of the confidentiality of this discharge information lies with you and/or your care-partner.

## 2018-12-13 NOTE — Progress Notes (Signed)
Pt's states no medical or surgical changes since previsit or office visit. 

## 2018-12-13 NOTE — Progress Notes (Signed)
PT taken to PACU. Monitors in place. VSS. Report given to RN. 

## 2018-12-14 ENCOUNTER — Telehealth: Payer: Self-pay | Admitting: *Deleted

## 2018-12-14 NOTE — Telephone Encounter (Signed)
  Follow up Call-  Call back number 12/13/2018  Post procedure Call Back phone  # [phone]  Permission to leave phone message Yes  Some recent data might be hidden     Patient questions:  Do you have a fever, pain , or abdominal swelling? no Pain Score  no  Have you tolerated food without any problems? yes  Have you been able to return to your normal activities? yes  Do you have any questions about your discharge instructions: Diet   no Medications  no Follow up visit  no  Do you have questions or concerns about your Care? no  Actions: * If pain score is 4 or above

## 2018-12-15 ENCOUNTER — Encounter: Payer: Self-pay | Admitting: Gastroenterology

## 2019-01-27 ENCOUNTER — Other Ambulatory Visit: Payer: Self-pay | Admitting: Cardiovascular Disease

## 2019-01-30 ENCOUNTER — Telehealth: Payer: Self-pay

## 2019-01-30 MED ORDER — NITROGLYCERIN 0.4 MG SL SUBL: SUBLINGUAL_TABLET | SUBLINGUAL | 0 refills | Status: DC

## 2019-01-30 NOTE — Telephone Encounter (Signed)
Refill sent for NTG tablets.

## 2019-01-31 ENCOUNTER — Ambulatory Visit: Payer: 59 | Admitting: Family Medicine

## 2019-01-31 ENCOUNTER — Other Ambulatory Visit: Payer: Self-pay

## 2019-01-31 ENCOUNTER — Encounter: Payer: Self-pay | Admitting: Family Medicine

## 2019-01-31 VITALS — BP 92/58 | HR 94 | Temp 98.7°F | Ht 65.0 in | Wt 140.8 lb

## 2019-01-31 DIAGNOSIS — S61219A Laceration without foreign body of unspecified finger without damage to nail, initial encounter: Secondary | ICD-10-CM | POA: Diagnosis not present

## 2019-01-31 DIAGNOSIS — L089 Local infection of the skin and subcutaneous tissue, unspecified: Secondary | ICD-10-CM

## 2019-01-31 MED ORDER — CEPHALEXIN 500 MG PO CAPS: 500.0000 mg | ORAL_CAPSULE | Freq: Three times a day (TID) | ORAL | 0 refills | Status: DC

## 2019-01-31 NOTE — Patient Instructions (Addendum)
Warm soaks 2-3 times daily.  Complete course of antibiotics. Call if redness if spreading, increase pain, or fever.

## 2019-01-31 NOTE — Progress Notes (Signed)
Chief Complaint  Patient presents with  . Finger Laceration    Left Middle Finger x 1 week ago    History of Present Illness: HPI  58 year old male pt of Dr. Lillie Fragmin presents with new onset laceration in left middle finger 1 weeks ago.  He is not sure how he cut his finger. 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 may have cut with metal. Wound not very deep.  Washed hands afterward, not treating with anything. In last 3-4 days.. he noted swelling, pain, redness.  No joint pain. No fever. No spreading redness.  No exposure to MRSA.  COVID 19 screen No recent travel or known exposure to COVID19 The patient denies respiratory symptoms of COVID 19 at this time.  The importance of social distancing was discussed today.   Review of Systems  Constitutional: Negative for chills and fever.  HENT: Negative for congestion and ear pain.   Eyes: Negative for pain and redness.  Respiratory: Negative for cough and shortness of breath.   Cardiovascular: Negative for chest pain, palpitations and leg swelling.  Gastrointestinal: Negative for abdominal pain, blood in stool, constipation, diarrhea, nausea and vomiting.  Genitourinary: Negative for dysuria.  Musculoskeletal: Negative for falls and myalgias.  Skin: Negative for rash.  Neurological: Negative for dizziness.  Psychiatric/Behavioral: Negative for depression. The patient is not nervous/anxious.       Past Medical History:  Diagnosis Date  . Allergy   . Anxiety   . BPH (benign prostatic hyperplasia)   . COPD (chronic obstructive pulmonary disease) ([place])   . Current smoker   . OA (osteoarthritis)     reports that he has been smoking cigarettes. He has a 15.00 pack-year smoking history. He has never used smokeless tobacco. He reports that he does not drink alcohol or use drugs.   Current Outpatient Medications:  .  acetaminophen (TYLENOL) 500 MG tablet, Take 1,000 mg by mouth every 6 (six) hours as needed.  ,  Disp: , Rfl:  .  aspirin EC 81 MG tablet, Take 81 mg by mouth daily., Disp: , Rfl:  .  budesonide-formoterol (SYMBICORT) 160-4.5 MCG/ACT inhaler, Inhale 2 puffs into the lungs 2 (two) times daily., Disp: 1 Inhaler, Rfl: 3 .  escitalopram (LEXAPRO) 10 MG tablet, TAKE 1 TABLET(10 MG) BY MOUTH DAILY, Disp: 90 tablet, Rfl: 0 .  fludrocortisone (FLORINEF) 0.1 MG tablet, Take 1 tablet (0.1 mg total) by mouth daily., Disp: 30 tablet, Rfl: 6 .  isosorbide mononitrate (IMDUR) 30 MG 24 hr tablet, TAKE 1 TABLET BY MOUTH  DAILY, Disp: 90 tablet, Rfl: 0 .  Multiple Vitamin (MULTIVITAMIN) tablet, Take 1 tablet by mouth daily.  , Disp: , Rfl:  .  nitroGLYCERIN (NITROSTAT) 0.4 MG SL tablet, PLACE 1 TABLET UNDER THE TONGUE EVERY 5 MINUTES AS NEEDED FOR CHEST PAIN, Disp: 25 tablet, Rfl: 0 .  Omega-3 Fatty Acids (FISH OIL) 1000 MG CAPS, Take 1 capsule by mouth daily., Disp: , Rfl:    Observations/Objective: Blood pressure (!) 92/58, pulse 94, temperature 98.7 F (37.1 C), temperature source Oral, height 5\' 5"  (1.651 m), weight 140 lb 12 oz (63.8 kg).  Physical Exam Constitutional:      Appearance: He is well-developed.  HENT:     Head: Normocephalic.     Right Ear: Hearing normal.     Left Ear: Hearing normal.     Nose: Nose normal.  Neck:     Thyroid: No thyroid mass or thyromegaly.  Vascular: No carotid bruit.     Trachea: Trachea normal.  Cardiovascular:     Rate and Rhythm: Normal rate and regular rhythm.     Pulses: Normal pulses.     Heart sounds: Heart sounds not distant. No murmur. No friction rub. No gallop.      Comments: No peripheral edema Pulmonary:     Effort: Pulmonary effort is normal. No respiratory distress.     Breath sounds: Normal breath sounds.  Skin:    General: Skin is warm and dry.     Findings: No rash.     Comments: decreased sensation to touch in tip of left index finger, linear laceration with pale area centrally and 2 cm of surrounding erythema, ttp and swelling in  distal half of index finger.   no focal DIP PIP MCP joint pain or redness, slight decreased ROM in index finger left DIP joint given swelling in finger  Psychiatric:        Speech: Speech normal.        Behavior: Behavior normal.        Thought Content: Thought content normal.      Assessment and Plan I&D  Meds, vitals, and allergies reviewed.   Indication: suspect abscess  Pt complaints of: erythema, pain, swelling  Location: left index finger  Informed consent obtained.  Pt aware of risks not limited to but including infection, bleeding, damage to near by organs.  Prep: etoh/betadine  Anesthesia: cold spray, good effect  Incision made with #11 blade  Small amount of pus expressed, minimal bleeding stopped easily. Culture obtained.  Tolerated well  Routine postprocedure instructions d/w pt-keep area clean and bandaged, follow up if concerns/spreading erythema/pain.   Infected finger laceration I and D performed without complication. Pending culture.  Treat with warm soaks TID, keep clean dry and bandaged. Start oral antibiotics x 7 days.    Eliezer Lofts, MD

## 2019-01-31 NOTE — Assessment & Plan Note (Signed)
I and D performed without complication. Pending culture.  Treat with warm soaks TID, keep clean dry and bandaged. Start oral antibiotics x 7 days.

## 2019-02-02 ENCOUNTER — Other Ambulatory Visit: Payer: Self-pay | Admitting: Family Medicine

## 2019-02-03 LAB — WOUND CULTURE
MICRO NUMBER:: 528559
SPECIMEN QUALITY:: ADEQUATE

## 2019-02-13 ENCOUNTER — Other Ambulatory Visit: Payer: Self-pay | Admitting: Cardiovascular Disease

## 2019-03-01 DIAGNOSIS — I219 Acute myocardial infarction, unspecified: Secondary | ICD-10-CM

## 2019-03-01 HISTORY — DX: Acute myocardial infarction, unspecified: I21.9

## 2019-03-17 NOTE — Progress Notes (Deleted)
Patient ID: Isaiah Rangel, male   DOB: [birth year], 59 y.o.   MRN: [id number]   Cardiology Office Note  Date:  03/17/2019   ID:  Isaiah Rangel, DOB 04-18-1961, MRN [id number]  PCP:  Owens Loffler, MD   No chief complaint on file.   HPI:  58 year old gentleman who is very active at baseline who works as a Company secretary,  history of smoking Who continues to smoke   GERD,  remote neck injury with fusion surgery in 12-01-00 with chronic neck pain and tightness,  chronic chest pain symptoms, relieved with nitroglycerin previous cardiac catheterization August 2014 showing minimal coronary artery disease,  CT coronary calcium score reviewed with him, score 135 presenting for follow-up of chest pain symptoms  In follow-up today he reports having significant weight loss over the past year  significant stress at home taking care of elderly father who is ill, father eventually passed away end of last year Dec 01, 2016 Used to be 155 pounds now 137 pounds He did hit a low 125 pounds This has caused orthostasis, baseline pressures in the 58-09 range systolic Working on the job often has to sit down secondary to orthostasis symptoms Reports he is staying hydrated but works in the hot sun Otherwise has been trying to stay very active  In the ER 11/2017 for chest pain and diarrhea Hospital records reviewed with the patient in detail diagnosed with enteritis on CT and discharged on antibiotics.  continued profuse and frequent diarrhea.  feeling lightheaded, developed chest pains around 4 AM, took a nitroglycerin tablet but the chest pain remains so he took a second nitroglycerin tablet at that time he had a syncopal episode.  Orthostatics done in the office 96 supine down to 83 standing with recovery up to 93 systolic after 3 minutes Heart rate from 60 up to 86 with standing  He continues to take isosorbide 30 mg in the morning Chronic chest pain typically in the morning better with isosorbide Does not  want to stop the isosorbide despite his low blood pressure  previous CT scan chest, coronary calcifications  in the distal left main, proximal LAD ostial region  EKG personally reviewed by myself on todays visit Shows normal sinus rhythm with rate 74 bpm no significant ST or T-wave changes  Other past medical history Previous GI workup was essentially negative, had EGD In the past, Ran out of isosorbide, symptoms became worse , almost went to the emergency room.   reports he can have a good day with no symptoms, very active at work, moves [employer], very heavy items weighing several 100 pounds. Other days can be doing nothing with symptoms  Reports he has levsin, takes this occasionally, unclear if it helps  a stress echocardiogram was performed that showed no significant ischemia. He achieved a heart rate greater than 150. Cardiac enzymes were negative during his hospital stay.  He did have knee surgery since his last visit. He continues to smoke one pack per day. He has tried Chantix in the past though had severe vertigo. He does also have Raynaud's in his hands  PMH:   has a past medical history of Allergy, Anxiety, BPH (benign prostatic hyperplasia), COPD (chronic obstructive pulmonary disease) ([place]), Current smoker, and OA (osteoarthritis).  PSH:    Past Surgical History:  Procedure Laterality Date  . BACK SURGERY    . CARDIAC CATHETERIZATION  04/06/13   [facility]- minor luminal irregularities, otherwise normal cors; EF > 55%. Medical management and smoking cessation  recommended  . KNEE ARTHROSCOPY W/ PARTIAL MEDIAL MENISCECTOMY Right 2012   [place], 2012  . NASAL SINUS SURGERY    . neck fusion    . VASECTOMY      Current Outpatient Medications  Medication Sig Dispense Refill  . acetaminophen (TYLENOL) 500 MG tablet Take 1,000 mg by mouth every 6 (six) hours as needed.      Marland Kitchen aspirin EC 81 MG tablet Take 81 mg by mouth daily.    . budesonide-formoterol (SYMBICORT) 160-4.5  MCG/ACT inhaler Inhale 2 puffs into the lungs 2 (two) times daily. 1 Inhaler 3  . cephALEXin (KEFLEX) 500 MG capsule Take 1 capsule (500 mg total) by mouth 3 (three) times daily. 21 capsule 0  . escitalopram (LEXAPRO) 10 MG tablet TAKE 1 TABLET(10 MG) BY MOUTH DAILY 90 tablet 1  . fludrocortisone (FLORINEF) 0.1 MG tablet Take 1 tablet (0.1 mg total) by mouth daily. 30 tablet 6  . isosorbide mononitrate (IMDUR) 30 MG 24 hr tablet TAKE 1 TABLET BY MOUTH  DAILY 90 tablet 0  . Multiple Vitamin (MULTIVITAMIN) tablet Take 1 tablet by mouth daily.      . nitroGLYCERIN (NITROSTAT) 0.4 MG SL tablet DISSOLVE 1 TABLET UNDER THE TONGUE EVERY 5 MINUTES AS NEEDED FOR CHEST PAIN 25 tablet 0  . Omega-3 Fatty Acids (FISH OIL) 1000 MG CAPS Take 1 capsule by mouth daily.     No current facility-administered medications for this visit.      Allergies:   Eggs or egg-derived products   Social History:  The patient  reports that he has been smoking cigarettes. He has a 15.00 pack-year smoking history. He has never used smokeless tobacco. He reports that he does not drink alcohol or use drugs.   Family History:   family history includes Arthritis in his father; CVA (age of onset: 97) in his father; Colon cancer in his mother; Coronary artery disease in his father; Coronary artery disease (age of onset: 66) in his brother; Heart attack in his brother and brother; Hypertension in his father.    Review of Systems: Review of Systems  Constitutional: Negative.   Respiratory: Negative.   Cardiovascular: Positive for chest pain.  Gastrointestinal: Negative.   Musculoskeletal: Negative.   Neurological: Positive for dizziness.  Psychiatric/Behavioral: Negative.   All other systems reviewed and are negative.    PHYSICAL EXAM: VS:  There were no vitals taken for this visit. , BMI There is no height or weight on file to calculate BMI. Constitutional:  oriented to person, place, and time. No distress.  HENT:  Head:  Normocephalic and atraumatic.  Eyes:  no discharge. No scleral icterus.  Neck: Normal range of motion. Neck supple. No JVD present.  Cardiovascular: Normal rate, regular rhythm, normal heart sounds and intact distal pulses. Exam reveals no gallop and no friction rub. No edema No murmur heard. Pulmonary/Chest: Effort normal and breath sounds normal. No stridor. No respiratory distress.  no wheezes.  no rales.  no tenderness.  Abdominal: Soft.  no distension.  no tenderness.  Musculoskeletal: Normal range of motion.  no  tenderness or deformity.  Neurological:  normal muscle tone. Coordination normal. No atrophy Skin: Skin is warm and dry. No rash noted. not diaphoretic.  Psychiatric:  normal mood and affect. behavior is normal. Thought content normal.   Recent Labs: 08/30/2018: ALT 13; BUN 13; Creatinine, Ser 0.89; Hemoglobin 14.6; Platelets 239.0; Potassium 4.0; Sodium 141    Lipid Panel Lab Results  Component Value Date  CHOL 175 08/30/2018   HDL 41.50 08/30/2018   LDLCALC 117 (H) 08/30/2018   TRIG 84.0 08/30/2018      Wt Readings from Last 3 Encounters:  01/31/19 140 lb 12 oz (63.8 kg)  12/13/18 142 lb (64.4 kg)  11/11/18 142 lb 9.6 oz (64.7 kg)    ASSESSMENT AND PLAN:  Chest pain, unspecified chest pain type - Plan: EKG 12-Lead Recommended he try Imdur the dinnertime her before bed  chest pains in the morning he takes Imdur in the morning Despite low blood pressure he does not want to stop medication  Tobacco abuse We have encouraged him to continue to work on weaning his cigarettes and smoking cessation. He will continue to work on this and does not want any assistance with chantix. Again discussed with him  Weight loss/protein calorie loss malnutrition 30 pound weight loss after loss of his father Still has not recovered much of the weight Causing orthostasis  Orthostasis Staying hydrated but still having symptomatic orthostasis symptoms Discussed Florinef and  midodrine He is requesting something to be done as he symptomatic Recommended he try Florinef 0.1 mg daily. May need to increase the dose after several weeks if no response. For now we'll try to avoid midodrine given his history of coronary spasm  Hyperlipidemia he does not want a statin at this time Again discussed with him  Coronary artery disease involving native coronary artery of native heart without angina pectoris CT coronary calcium score is low, he does not want a statin Recommended smoke cessation Continue Imdur for symptoms  Disposition:   F/U  3 months to follow-up on his blood pressure   Total encounter time more than 25 minutes  Greater than 50% was spent in counseling and coordination of care with the patient    No orders of the defined types were placed in this encounter.     Signed, Esmond Plants, M.D., Ph.D. 03/17/2019  [place], [place]

## 2019-03-19 ENCOUNTER — Other Ambulatory Visit: Payer: Self-pay

## 2019-03-19 ENCOUNTER — Emergency Department (HOSPITAL_COMMUNITY): Payer: 59

## 2019-03-19 ENCOUNTER — Encounter (HOSPITAL_COMMUNITY): Payer: Self-pay | Admitting: Emergency Medicine

## 2019-03-19 ENCOUNTER — Inpatient Hospital Stay (HOSPITAL_COMMUNITY)
Admission: EM | Admit: 2019-03-19 | Discharge: 2019-03-21 | DRG: 287 | Disposition: A | Discharge status: Home / Self Care | Payer: 59 | Attending: Family Medicine | Admitting: Family Medicine

## 2019-03-19 DIAGNOSIS — K219 Gastro-esophageal reflux disease without esophagitis: Secondary | ICD-10-CM | POA: Diagnosis present

## 2019-03-19 DIAGNOSIS — I25110 Atherosclerotic heart disease of native coronary artery with unstable angina pectoris: Principal | ICD-10-CM | POA: Diagnosis present

## 2019-03-19 DIAGNOSIS — I7300 Raynaud's syndrome without gangrene: Secondary | ICD-10-CM | POA: Diagnosis present

## 2019-03-19 DIAGNOSIS — I214 Non-ST elevation (NSTEMI) myocardial infarction: Secondary | ICD-10-CM | POA: Diagnosis present

## 2019-03-19 DIAGNOSIS — Z1159 Encounter for screening for other viral diseases: Secondary | ICD-10-CM

## 2019-03-19 DIAGNOSIS — I951 Orthostatic hypotension: Secondary | ICD-10-CM | POA: Diagnosis present

## 2019-03-19 DIAGNOSIS — E782 Mixed hyperlipidemia: Secondary | ICD-10-CM | POA: Diagnosis present

## 2019-03-19 DIAGNOSIS — Z79899 Other long term (current) drug therapy: Secondary | ICD-10-CM

## 2019-03-19 DIAGNOSIS — F17210 Nicotine dependence, cigarettes, uncomplicated: Secondary | ICD-10-CM | POA: Diagnosis present

## 2019-03-19 DIAGNOSIS — N400 Enlarged prostate without lower urinary tract symptoms: Secondary | ICD-10-CM | POA: Diagnosis present

## 2019-03-19 DIAGNOSIS — I208 Other forms of angina pectoris: Secondary | ICD-10-CM

## 2019-03-19 DIAGNOSIS — I10 Essential (primary) hypertension: Secondary | ICD-10-CM | POA: Diagnosis present

## 2019-03-19 DIAGNOSIS — I200 Unstable angina: Secondary | ICD-10-CM | POA: Diagnosis not present

## 2019-03-19 DIAGNOSIS — J449 Chronic obstructive pulmonary disease, unspecified: Secondary | ICD-10-CM | POA: Diagnosis present

## 2019-03-19 DIAGNOSIS — Z981 Arthrodesis status: Secondary | ICD-10-CM

## 2019-03-19 DIAGNOSIS — I2089 Other forms of angina pectoris: Secondary | ICD-10-CM

## 2019-03-19 DIAGNOSIS — I2510 Atherosclerotic heart disease of native coronary artery without angina pectoris: Secondary | ICD-10-CM | POA: Diagnosis present

## 2019-03-19 DIAGNOSIS — G4733 Obstructive sleep apnea (adult) (pediatric): Secondary | ICD-10-CM | POA: Diagnosis present

## 2019-03-19 DIAGNOSIS — Z7982 Long term (current) use of aspirin: Secondary | ICD-10-CM

## 2019-03-19 DIAGNOSIS — F418 Other specified anxiety disorders: Secondary | ICD-10-CM | POA: Diagnosis present

## 2019-03-19 LAB — BASIC METABOLIC PANEL
Anion gap: 9 (ref 5–15)
BUN: 13 mg/dL (ref 6–20)
CO2: 27 mmol/L (ref 22–32)
Calcium: 9.4 mg/dL (ref 8.9–10.3)
Chloride: 103 mmol/L (ref 98–111)
Creatinine, Ser: 0.93 mg/dL (ref 0.61–1.24)
GFR calc Af Amer: >60 mL/min (ref >60)
GFR calc non Af Amer: >60 mL/min (ref >60)
Glucose, Bld: 107 mg/dL — ABNORMAL HIGH (ref 70–99)
Potassium: 3.9 mmol/L (ref 3.5–5.1)
Sodium: 139 mmol/L (ref 135–145)

## 2019-03-19 LAB — CBC WITH DIFFERENTIAL/PLATELET
Abs Immature Granulocytes: 0.05 10*3/uL (ref 0.00–0.07)
Basophils Absolute: 0.1 10*3/uL (ref 0.0–0.1)
Basophils Relative: 1 %
Eosinophils Absolute: 0.4 10*3/uL (ref 0.0–0.5)
Eosinophils Relative: 3 %
HCT: 45.9 % (ref 39.0–52.0)
Hemoglobin: 15.3 g/dL (ref 13.0–17.0)
Immature Granulocytes: 0 %
Lymphocytes Relative: 25 %
Lymphs Abs: 3.3 10*3/uL (ref 0.7–4.0)
MCH: 31.5 pg (ref 26.0–34.0)
MCHC: 33.3 g/dL (ref 30.0–36.0)
MCV: 94.4 fL (ref 80.0–100.0)
Monocytes Absolute: 1.1 10*3/uL — ABNORMAL HIGH (ref 0.1–1.0)
Monocytes Relative: 8 %
Neutro Abs: 8.5 10*3/uL — ABNORMAL HIGH (ref 1.7–7.7)
Neutrophils Relative %: 63 %
Platelets: 236 10*3/uL (ref 150–400)
RBC: 4.86 MIL/uL (ref 4.22–5.81)
RDW: 15.6 % — ABNORMAL HIGH (ref 11.5–15.5)
WBC: 13.5 10*3/uL — ABNORMAL HIGH (ref 4.0–10.5)
nRBC: 0 % (ref 0.0–0.2)

## 2019-03-19 LAB — MAGNESIUM: Magnesium: 2.2 mg/dL (ref 1.7–2.4)

## 2019-03-19 LAB — TROPONIN I (HIGH SENSITIVITY)
Troponin I (High Sensitivity): 3 ng/L (ref <18)
Troponin I (High Sensitivity): 3 ng/L (ref <18)

## 2019-03-19 LAB — LIPASE, BLOOD: Lipase: 33 U/L (ref 11–51)

## 2019-03-19 LAB — CBG MONITORING, ED: Glucose-Capillary: 90 mg/dL (ref 70–99)

## 2019-03-19 MED ORDER — HEPARIN (PORCINE) 25000 UT/250ML-% IV SOLN
900.0000 [IU]/h | INTRAVENOUS | Status: DC
  Administered 2019-03-19: 800 [IU]/h via INTRAVENOUS
  Administered 2019-03-20: 900 [IU]/h via INTRAVENOUS
  Filled 2019-03-19 (×2): qty 250

## 2019-03-19 MED ORDER — HEPARIN BOLUS VIA INFUSION
4000.0000 [IU] | Freq: Once | INTRAVENOUS | Status: AC
  Administered 2019-03-19: 4000 [IU] via INTRAVENOUS
  Filled 2019-03-19: qty 4000

## 2019-03-19 MED ORDER — NITROGLYCERIN 0.4 MG SL SUBL
0.4000 mg | SUBLINGUAL_TABLET | SUBLINGUAL | Status: AC | PRN
  Administered 2019-03-19 – 2019-03-20 (×3): 0.4 mg via SUBLINGUAL
  Filled 2019-03-19 (×2): qty 1

## 2019-03-19 NOTE — ED Triage Notes (Signed)
Pt arrives via ems. Pt was woken from sleep with midline chest pressure that he rates 8/10. Pressure doesn't radiate. Current Smoker. Pt took nitro @ home nausea vomiting sweating at home. Pain was relieved by nitro. 324 po aspirin given by ems.

## 2019-03-19 NOTE — H&P (Signed)
History and Physical   Isaiah Rangel:[id number] DOB: 1961/02/12 DOA: 03/19/2019  Referring MD/NP/PA: Dr. Reather Converse  PCP: Owens Loffler, MD   Outpatient Specialists: Ida Rogue, MD, cardiologist in [facility]  Patient coming from: Home  Chief Complaint: Chest pain  HPI: Isaiah Rangel is a 58 y.o. male with medical history significant of coronary artery disease status post previous catheterizations with minimal coronary artery disease in 2014, he has a CT coronary calcium score of 135 then, history of GERD, hypertension, tobacco abuse, osteoarthritis, BPH who presents to the ER with sudden onset of substernal chest pain.  Symptoms started today suddenly without warning.  Associated with some shortness of breath.  No cough.  Patient felt like somebody was sitting on his chest.  He described as 8 out of 10 pressure.  Patient has not had any recent cardiac work-up.  He has GERD but has been following with gastroenterologist and has been having his PPIs.  He was seen in the ED so far enzymes appear to be negative and EKGs remains unchanged.  Patient however continue to have significant chest pain on and off.  Has been admitted with unstable angina.  ED Course: Temperature 97.6 blood pressure 120/92 pulse 68 respiratory rate of 18 oxygen sats 98% room air.  White count is 13.5 the rest of the CBC and chemistry appeared to be within normal.  Lipase and magnesium are within normal.  Chest x-ray shows no acute finding.  EKG is unchanged.  Patient will be admitted for rule out MI.  Review of Systems: As per HPI otherwise 10 point review of systems negative.    Past Medical History:  Diagnosis Date  . Allergy   . Anxiety   . BPH (benign prostatic hyperplasia)   . COPD (chronic obstructive pulmonary disease) ([place])   . Current smoker   . OA (osteoarthritis)     Past Surgical History:  Procedure Laterality Date  . BACK SURGERY    . CARDIAC CATHETERIZATION  04/06/13   [facility]- minor luminal  irregularities, otherwise normal cors; EF > 55%. Medical management and smoking cessation recommended  . KNEE ARTHROSCOPY W/ PARTIAL MEDIAL MENISCECTOMY Right 2012   [place], 2012  . NASAL SINUS SURGERY    . neck fusion    . VASECTOMY       reports that he has been smoking cigarettes. He has a 45.00 pack-year smoking history. He has never used smokeless tobacco. He reports that he does not drink alcohol or use drugs.  Allergies  Allergen Reactions  . Other Nausea And Vomiting and Other (See Comments)    Quail eggs caused severe stomach pain and n/v    Family History  Problem Relation Age of Onset  . Arthritis Father   . Coronary artery disease Father   . Hypertension Father   . CVA Father 5  . Colon cancer Mother   . Heart attack Brother   . Heart attack Brother   . Coronary artery disease Brother 62  . Esophageal cancer Neg Hx   . Stomach cancer Neg Hx   . Rectal cancer Neg Hx      Prior to Admission medications   Medication Sig Start Date End Date Taking? Authorizing Provider  albuterol (VENTOLIN HFA) 108 (90 Base) MCG/ACT inhaler Inhale 2 puffs into the lungs every 6 (six) hours as needed for wheezing or shortness of breath.   Yes [provider]  aspirin EC 81 MG tablet Take 81 mg by mouth daily.   Yes  [provider]  escitalopram (LEXAPRO) 10 MG tablet TAKE 1 TABLET(10 MG) BY MOUTH DAILY Patient taking differently: Take 10 mg by mouth daily.  02/02/19  Yes [name], [name], MD  fludrocortisone (FLORINEF) 0.1 MG tablet Take 1 tablet (0.1 mg total) by mouth daily. Patient taking differently: Take 0.1 mg by mouth at bedtime.  05/23/18  Yes Minna Merritts, MD  isosorbide mononitrate (IMDUR) 30 MG 24 hr tablet TAKE 1 TABLET BY MOUTH  DAILY Patient taking differently: Take 30 mg by mouth at bedtime.  01/30/19  Yes Gollan, Kathlene November, MD  Multiple Vitamin (MULTIVITAMIN WITH MINERALS) TABS tablet Take 1 tablet by mouth daily.   Yes [provider]   nitroGLYCERIN (NITROSTAT) 0.4 MG SL tablet DISSOLVE 1 TABLET UNDER THE TONGUE EVERY 5 MINUTES AS NEEDED FOR CHEST PAIN Patient taking differently: Place 0.4 mg under the tongue every 5 (five) minutes as needed for chest pain.  02/14/19  Yes Gollan, Kathlene November, MD  Omega-3 Fatty Acids (FISH OIL) 1000 MG CAPS Take 1,000 mg by mouth daily.    Yes [provider]  budesonide-formoterol (SYMBICORT) 160-4.5 MCG/ACT inhaler Inhale 2 puffs into the lungs 2 (two) times daily. Patient not taking: Reported on 03/19/2019 09/05/18   Owens Loffler, MD    Physical Exam: Vitals:   03/19/19 1919 03/19/19 1956 03/19/19 2210  BP:  (!) 129/92 116/70  Pulse:  68 66  Resp:  18   Temp: 97.6 F (36.4 C)    TempSrc: Oral        Constitutional: NAD, anxious Vitals:   03/19/19 1919 03/19/19 1956 03/19/19 2210  BP:  (!) 129/92 116/70  Pulse:  68 66  Resp:  18   Temp: 97.6 F (36.4 C)    TempSrc: Oral     Eyes: PERRL, lids and conjunctivae normal ENMT: Mucous membranes are moist. Posterior pharynx clear of any exudate or lesions.Normal dentition.  Neck: normal, supple, no masses, no thyromegaly Respiratory: clear to auscultation bilaterally, no wheezing, no crackles. Normal respiratory effort. No accessory muscle use.  Cardiovascular: Regular rate and rhythm, no murmurs / rubs / gallops. No extremity edema. 2+ pedal pulses. No carotid bruits.  Abdomen: no tenderness, no masses palpated. No hepatosplenomegaly. Bowel sounds positive.  Musculoskeletal: no clubbing / cyanosis. No joint deformity upper and lower extremities. Good ROM, no contractures. Normal muscle tone.  Skin: no rashes, lesions, ulcers. No induration Neurologic: CN 2-12 grossly intact. Sensation intact, DTR normal. Strength 5/5 in all 4.  Psychiatric: Normal judgment and insight. Alert and oriented x 3. Normal mood.     Labs on Admission: I have personally reviewed following labs and imaging studies  CBC: Recent Labs  Lab  03/19/19 2005  WBC 13.5*  NEUTROABS 8.5*  HGB 15.3  HCT 45.9  MCV 94.4  PLT 361   Basic Metabolic Panel: Recent Labs  Lab 03/19/19 2005  NA 139  K 3.9  CL 103  CO2 27  GLUCOSE 107*  BUN 13  CREATININE 0.93  CALCIUM 9.4  MG 2.2   GFR: CrCl cannot be calculated (Unknown ideal weight.). Liver Function Tests: No results for input(s): AST, ALT, ALKPHOS, BILITOT, PROT, ALBUMIN in the last 168 hours. Recent Labs  Lab 03/19/19 2005  LIPASE 33   No results for input(s): AMMONIA in the last 168 hours. Coagulation Profile: No results for input(s): INR, PROTIME in the last 168 hours. Cardiac Enzymes: No results for input(s): CKTOTAL, CKMB, CKMBINDEX, TROPONINI in the last 168 hours. BNP (last  3 results) No results for input(s): PROBNP in the last 8760 hours. HbA1C: No results for input(s): HGBA1C in the last 72 hours. CBG: Recent Labs  Lab 03/19/19 2011  GLUCAP 90   Lipid Profile: No results for input(s): CHOL, HDL, LDLCALC, TRIG, CHOLHDL, LDLDIRECT in the last 72 hours. Thyroid Function Tests: No results for input(s): TSH, T4TOTAL, FREET4, T3FREE, THYROIDAB in the last 72 hours. Anemia Panel: No results for input(s): VITAMINB12, FOLATE, FERRITIN, TIBC, IRON, RETICCTPCT in the last 72 hours. Urine analysis:    Component Value Date/Time   COLORURINE YELLOW (A) 11/30/2017 0923   APPEARANCEUR CLEAR (A) 11/30/2017 0923   LABSPEC 1.005 11/30/2017 0923   PHURINE 6.0 11/30/2017 0923   GLUCOSEU 50 (A) 11/30/2017 0923   HGBUR NEGATIVE 11/30/2017 0923   HGBUR moderate 07/09/2010 1144   BILIRUBINUR NEGATIVE 11/30/2017 0923   KETONESUR NEGATIVE 11/30/2017 0923   PROTEINUR NEGATIVE 11/30/2017 0923   UROBILINOGEN 0.2 07/09/2010 1144   NITRITE NEGATIVE 11/30/2017 0923   LEUKOCYTESUR NEGATIVE 11/30/2017 0923   Sepsis Labs: @LABRCNTIP (procalcitonin:4,lacticidven:4) )No results found for this or any previous visit (from the past 240 hour(s)).   Radiological Exams on  Admission: Dg Chest Portable 1 View  Result Date: 03/19/2019 CLINICAL DATA:  Central chest pain EXAM: PORTABLE CHEST 1 VIEW COMPARISON:  09/05/2018 FINDINGS: Chronic hyperinflation and central bronchial thickening. Normal heart size and mediastinal contours. No acute airspace disease, pleural effusion, pneumothorax or pulmonary edema. No acute osseous abnormalities. Surgical hardware in the lower cervical spine partially included. IMPRESSION: Chronic hyperinflation and central bronchial thickening consistent with COPD. No superimposed acute abnormality. Electronically Signed   By: Keith Rake M.D.   On: 03/19/2019 20:08    EKG: Independently reviewed.  It shows sinus rhythm with a rate of 59.  Normal intervals.  Low voltage EKG with nonspecific ST changes.  Assessment/Plan Principal Problem:   Unstable angina (HCC) Active Problems:   Mixed hyperlipidemia   GERD   Coronary artery disease involving native coronary artery of native heart without angina pectoris   COPD (chronic obstructive pulmonary disease) with chronic bronchitis ([place])     #1 unstable angina, patient will be admitted for observation.  Continue to cycle enzymes.  Patient has significant past history of coronary artery disease.  Notably also GERD on chronic chest pain has been documented.  Still with his risk factors he will benefit from cardiac stress testing.  We will continue treatment with home regimen.  Cardiology may want to be consulted for outpatient follow-up.  Started on heparin drip.  Nitroglycerin as needed  #2 COPD: No significant exacerbation.  Continue with inhalers, and monitor  #3 tobacco abuse: We will add nicotine patch was tobacco cessation counseling.  #4 GERD: Most likely contributing to symptoms.  Initiate and continue PPIs  #5 hyperlipidemia: Patient is on omega-3 fatty acids.  We will continue.  #6 depression with anxiety: Continue Lexapro   DVT prophylaxis: Heparin drip Code Status: Full  code Family Communication: No family at bedside Disposition Plan: Home Consults called: None but may require cardiology consult for stress test Admission status: Observation  Severity of Illness: The appropriate patient status for this patient is OBSERVATION. Observation status is judged to be reasonable and necessary in order to provide the required intensity of service to ensure the patient's safety. The patient's presenting symptoms, physical exam findings, and initial radiographic and laboratory data in the context of their medical condition is felt to place them at decreased risk for further clinical deterioration. Furthermore, it  is anticipated that the patient will be medically stable for discharge from the hospital within 2 midnights of admission. The following factors support the patient status of observation.   " The patient's presenting symptoms include chest pain. " The physical exam findings include mild expiratory wheezing. " The initial radiographic and laboratory data are normal enzymes chest x-ray and EKG.     Barbette Merino MD [facility] Pager [phone]  If 7PM-7AM, please contact night-coverage [email or web address] Password [facility]  03/19/2019, 11:05 PM

## 2019-03-19 NOTE — Progress Notes (Signed)
ANTICOAGULATION CONSULT NOTE - Initial Consult  Pharmacy Consult for Heparin Indication: chest pain/ACS  Allergies  Allergen Reactions  . Other Nausea And Vomiting and Other (See Comments)    Quail eggs caused severe stomach pain and n/v    Patient Measurements:   Heparin Dosing Weight:   Vital Signs: Temp: 97.6 F (36.4 C) (07/19 1919) Temp Source: Oral (07/19 1919) BP: 116/70 (07/19 2210) Pulse Rate: 66 (07/19 2210)  Labs: Recent Labs    03/19/19 2005  HGB 15.3  HCT 45.9  PLT 236  CREATININE 0.93  TROPONINIHS 3    CrCl cannot be calculated (Unknown ideal weight.).   Medical History: Past Medical History:  Diagnosis Date  . Allergy   . Anxiety   . BPH (benign prostatic hyperplasia)   . COPD (chronic obstructive pulmonary disease) ([place])   . Current smoker   . OA (osteoarthritis)     Medications:  No current facility-administered medications on file prior to encounter.    Current Outpatient Medications on File Prior to Encounter  Medication Sig Dispense Refill  . albuterol (VENTOLIN HFA) 108 (90 Base) MCG/ACT inhaler Inhale 2 puffs into the lungs every 6 (six) hours as needed for wheezing or shortness of breath.    Marland Kitchen aspirin EC 81 MG tablet Take 81 mg by mouth daily.    Marland Kitchen escitalopram (LEXAPRO) 10 MG tablet TAKE 1 TABLET(10 MG) BY MOUTH DAILY (Patient taking differently: Take 10 mg by mouth daily. ) 90 tablet 1  . fludrocortisone (FLORINEF) 0.1 MG tablet Take 1 tablet (0.1 mg total) by mouth daily. (Patient taking differently: Take 0.1 mg by mouth at bedtime. ) 30 tablet 6  . isosorbide mononitrate (IMDUR) 30 MG 24 hr tablet TAKE 1 TABLET BY MOUTH  DAILY (Patient taking differently: Take 30 mg by mouth at bedtime. ) 90 tablet 0  . Multiple Vitamin (MULTIVITAMIN WITH MINERALS) TABS tablet Take 1 tablet by mouth daily.    . nitroGLYCERIN (NITROSTAT) 0.4 MG SL tablet DISSOLVE 1 TABLET UNDER THE TONGUE EVERY 5 MINUTES AS NEEDED FOR CHEST PAIN (Patient taking  differently: Place 0.4 mg under the tongue every 5 (five) minutes as needed for chest pain. ) 25 tablet 0  . Omega-3 Fatty Acids (FISH OIL) 1000 MG CAPS Take 1,000 mg by mouth daily.     . budesonide-formoterol (SYMBICORT) 160-4.5 MCG/ACT inhaler Inhale 2 puffs into the lungs 2 (two) times daily. (Patient not taking: Reported on 03/19/2019) 1 Inhaler 3  . [DISCONTINUED] acetaminophen (TYLENOL) 500 MG tablet Take 1,000 mg by mouth every 6 (six) hours as needed.      . [DISCONTINUED] cephALEXin (KEFLEX) 500 MG capsule Take 1 capsule (500 mg total) by mouth 3 (three) times daily. (Patient not taking: Reported on 03/19/2019) 21 capsule 0  . [DISCONTINUED] Multiple Vitamin (MULTIVITAMIN) tablet Take 1 tablet by mouth daily.         Assessment: 58 y.o. male with chest pain for heparin Goal of Therapy:  Heparin level 0.3-0.7 units/ml Monitor platelets by anticoagulation protocol: Yes   Plan:  Heparin 4000 units IV bolus, then start heparin 800 units/hr Check heparin level in 6 hours.   Caryl Pina 03/19/2019,11:04 PM

## 2019-03-19 NOTE — ED Provider Notes (Signed)
[facility] EMERGENCY DEPARTMENT Provider Note   CSN: [id number] Arrival date & time: 03/19/19  1914     History   Chief Complaint Chief Complaint  Patient presents with  . Chest Pain    HPI Isaiah Rangel is a 58 y.o. male.     HPI  Patient is a 58 year old male past medical history of CAD, COPD (current smoker), anxiety, OSA who arrives to the ED via EMS from home after he was woken up from his sleep with substernal chest pressure.  The pain was an 8/10 when it woke him up from his sleep.  He described the pain as the feeling of somebody sitting on his chest.  it did not radiate. He took 1 sublingual nitroglycerin at home and got mild relief.  He did have nausea and was diaphoretic at the time of onset.  He did have a previous left heart cath done in 2014, without stent placement only mild disease.  Past Medical History:  Diagnosis Date  . Allergy   . Anxiety   . BPH (benign prostatic hyperplasia)   . COPD (chronic obstructive pulmonary disease) ([place])   . Current smoker   . OA (osteoarthritis)     Patient Active Problem List   Diagnosis Date Noted  . Infected finger laceration 01/31/2019  . COPD (chronic obstructive pulmonary disease) with chronic bronchitis ([place]) 09/05/2018  . Orthostasis 05/23/2018  . Protein calorie malnutrition ([place]) 05/23/2018  . Acute ischemic enteritis ([place]) 11/26/2017  . Coronary artery disease involving native coronary artery of native heart without angina pectoris 02/21/2016  . Chronic chest pain 06/27/2015  . Cerebrovascular disease 04/29/2015  . Radicular pain of right lower back 12/05/2013  . S/P cardiac catheterization 04/19/2013  . Smoking 09/09/2011  . Mixed hyperlipidemia 04/09/2010  . GERD 09/21/2008  . ERECTILE DYSFUNCTION 09/20/2008    Past Surgical History:  Procedure Laterality Date  . BACK SURGERY    . CARDIAC CATHETERIZATION  04/06/13   [facility]- minor luminal irregularities, otherwise normal cors; EF >  55%. Medical management and smoking cessation recommended  . KNEE ARTHROSCOPY W/ PARTIAL MEDIAL MENISCECTOMY Right 2012   [place], 2012  . NASAL SINUS SURGERY    . neck fusion    . VASECTOMY          Home Medications    Prior to Admission medications   Medication Sig Start Date End Date Taking? Authorizing Provider  acetaminophen (TYLENOL) 500 MG tablet Take 1,000 mg by mouth every 6 (six) hours as needed.      [provider]  aspirin EC 81 MG tablet Take 81 mg by mouth daily.    [provider]  budesonide-formoterol (SYMBICORT) 160-4.5 MCG/ACT inhaler Inhale 2 puffs into the lungs 2 (two) times daily. 09/05/18   [name], [name], MD  cephALEXin (KEFLEX) 500 MG capsule Take 1 capsule (500 mg total) by mouth 3 (three) times daily. 01/31/19   Bedsole, Amy E, MD  escitalopram (LEXAPRO) 10 MG tablet TAKE 1 TABLET(10 MG) BY MOUTH DAILY 02/02/19   [name], [name], MD  fludrocortisone (FLORINEF) 0.1 MG tablet Take 1 tablet (0.1 mg total) by mouth daily. 05/23/18   Minna Merritts, MD  isosorbide mononitrate (IMDUR) 30 MG 24 hr tablet TAKE 1 TABLET BY MOUTH  DAILY 01/30/19   Minna Merritts, MD  Multiple Vitamin (MULTIVITAMIN) tablet Take 1 tablet by mouth daily.      [provider]  nitroGLYCERIN (NITROSTAT) 0.4 MG SL tablet DISSOLVE 1 TABLET UNDER  THE TONGUE EVERY 5 MINUTES AS NEEDED FOR CHEST PAIN 02/14/19   Minna Merritts, MD  Omega-3 Fatty Acids (FISH OIL) 1000 MG CAPS Take 1 capsule by mouth daily.    [provider]    Family History Family History  Problem Relation Age of Onset  . Arthritis Father   . Coronary artery disease Father   . Hypertension Father   . CVA Father 24  . Colon cancer Mother   . Heart attack Brother   . Heart attack Brother   . Coronary artery disease Brother 32  . Esophageal cancer Neg Hx   . Stomach cancer Neg Hx   . Rectal cancer Neg Hx     Social History Social History   Tobacco Use  . Smoking status: Current  Every Day Smoker    Packs/day: 1.50    Years: 30.00    Pack years: 45.00    Types: Cigarettes  . Smokeless tobacco: Never Used  . Tobacco comment: he is aware he needs to quit   Substance Use Topics  . Alcohol use: No    Alcohol/week: 0.0 standard drinks  . Drug use: No     Allergies   Eggs or egg-derived products   Review of Systems Review of Systems  Constitutional: Negative for chills and fever.  HENT: Negative for ear pain and sore throat.   Eyes: Negative for pain and visual disturbance.  Respiratory: Positive for shortness of breath (during initial onset of symptoms. none at this time). Negative for cough.   Cardiovascular: Positive for chest pain (pressure). Negative for palpitations.  Gastrointestinal: Negative for abdominal pain and vomiting.  Genitourinary: Negative for dysuria and hematuria.  Musculoskeletal: Negative for arthralgias and back pain.  Skin: Negative for color change and rash.  Neurological: Negative for seizures and syncope.  All other systems reviewed and are negative.    Physical Exam Updated Vital Signs BP (!) 82/59   Pulse 65   Temp 97.9 F (36.6 C) (Oral)   Resp 16   Ht 5\' 5"  (1.651 m)   Wt 59.5 kg Comment: scale b  SpO2 91%   BMI 21.83 kg/m   Physical Exam Vitals signs and nursing note reviewed.  Constitutional:      General: He is not in acute distress.    Appearance: He is well-developed and normal weight. He is not ill-appearing.  HENT:     Head: Normocephalic and atraumatic.  Eyes:     Conjunctiva/sclera: Conjunctivae normal.     Pupils: Pupils are equal, round, and reactive to light.  Neck:     Musculoskeletal: Neck supple.     Vascular: No JVD.  Cardiovascular:     Rate and Rhythm: Normal rate and regular rhythm.     Heart sounds: No murmur.  Pulmonary:     Effort: Pulmonary effort is normal. No respiratory distress.     Breath sounds: Normal breath sounds. No decreased breath sounds.  Abdominal:     Palpations:  Abdomen is soft.     Tenderness: There is no abdominal tenderness.  Musculoskeletal: Normal range of motion.  Skin:    General: Skin is warm and dry.  Neurological:     General: No focal deficit present.     Mental Status: He is alert.  Psychiatric:        Mood and Affect: Mood normal.      ED Treatments / Results  Labs (all labs ordered are listed, but only abnormal results are displayed) Labs Reviewed  BASIC METABOLIC PANEL - Abnormal; Notable for the following components:      Result Value   Glucose, Bld 107 (*)    All other components within normal limits  CBC WITH DIFFERENTIAL/PLATELET - Abnormal; Notable for the following components:   WBC 13.5 (*)    RDW 15.6 (*)    Neutro Abs 8.5 (*)    Monocytes Absolute 1.1 (*)    All other components within normal limits  HEPARIN LEVEL (UNFRACTIONATED) - Abnormal; Notable for the following components:   Heparin Unfractionated 0.29 (*)    All other components within normal limits  CBC WITH DIFFERENTIAL/PLATELET - Abnormal; Notable for the following components:   WBC 10.7 (*)    RDW 15.7 (*)    All other components within normal limits  RENAL FUNCTION PANEL - Abnormal; Notable for the following components:   Glucose, Bld 143 (*)    Phosphorus 2.1 (*)    All other components within normal limits  HEMOGLOBIN A1C - Abnormal; Notable for the following components:   Hgb A1c MFr Bld 6.2 (*)    All other components within normal limits  LIPID PANEL - Abnormal; Notable for the following components:   HDL 34 (*)    LDL Cholesterol 111 (*)    All other components within normal limits  CBC - Abnormal; Notable for the following components:   WBC 12.1 (*)    RBC 4.19 (*)    RDW 15.6 (*)    All other components within normal limits  RENAL FUNCTION PANEL - Abnormal; Notable for the following components:   Glucose, Bld 102 (*)    Calcium 8.6 (*)    Albumin 3.4 (*)    All other components within normal limits  TROPONIN I (HIGH  SENSITIVITY) - Abnormal; Notable for the following components:   Troponin I (High Sensitivity) 40 (*)    All other components within normal limits  TROPONIN I (HIGH SENSITIVITY) - Abnormal; Notable for the following components:   Troponin I (High Sensitivity) 157 (*)    All other components within normal limits  SARS CORONAVIRUS 2 (HOSPITAL ORDER, [facility])  MAGNESIUM  LIPASE, BLOOD  HIV ANTIBODY (ROUTINE TESTING W REFLEX)  HEPARIN LEVEL (UNFRACTIONATED)  CBG MONITORING, ED  TROPONIN I (HIGH SENSITIVITY)  TROPONIN I (HIGH SENSITIVITY)    EKG EKG Interpretation  Date/Time:  Sunday March 19 2019 20:13:49 EDT Ventricular Rate:  59 PR Interval:    QRS Duration: 88 QT Interval:  415 QTC Calculation: 412 R Axis:   80 Text Interpretation:  Sinus rhythm Confirmed by Elnora Morrison [phone]) on 03/19/2019 10:14:34 PM   Radiology Dg Chest Portable 1 View  Result Date: 03/19/2019 CLINICAL DATA:  Central chest pain EXAM: PORTABLE CHEST 1 VIEW COMPARISON:  09/05/2018 FINDINGS: Chronic hyperinflation and central bronchial thickening. Normal heart size and mediastinal contours. No acute airspace disease, pleural effusion, pneumothorax or pulmonary edema. No acute osseous abnormalities. Surgical hardware in the lower cervical spine partially included. IMPRESSION: Chronic hyperinflation and central bronchial thickening consistent with COPD. No superimposed acute abnormality. Electronically Signed   By: Keith Rake M.D.   On: 03/19/2019 20:08    Procedures Procedures (including critical care time)  Medications Ordered in ED Medications - No data to display   Initial Impression / Assessment and Plan / ED Course  I have reviewed the triage vital signs and the nursing notes.  Pertinent labs & imaging results that were available during my care of the patient were  reviewed by me and considered in my medical decision making (see chart for details).  Of note, this  patient was evaluated in the Emergency Department for the symptoms described in the history of present illness. He was evaluated in the context of the global COVID-19 pandemic, which necessitated consideration that the patient might be at risk for infection with the SARS-CoV-2 virus that causes COVID-19. Institutional protocols and algorithms that pertain to the evaluation of patients at risk for COVID-19 are in a state of rapid change based on information released by regulatory bodies including the CDC and federal and state organizations. These policies and algorithms were followed during the patient's care in the ED.  During this patient encounter, the patient was wearing a mask, and throughout this encounter I was wearing at least a surgical mask.  I was not within 6 feet of this patient for more than 15 minutes without eye protection when they were not wearing mask.   Differentials considered: STEMI, nSTEMI, unstable angina, pneumonia, dissection  EM Physician interpretation of Labs & Imaging: CBC with mild leukocytosis BMP grossly unremarkable Lipase normal Serum troponin negative x2  Medical Decision Making:  VEDDER BRITTIAN is a 58 y.o. male with the above past medical history significant for CAD, GERD, COPD current smoker who presented to the emergency department today for evaluation of acute onset chest pressure at rest that woke him up from his sleep.  He did get relief with nitroglycerin.  At the time of my initial examination his pain was resolved.  However, patient's chest pressure did return several times while he was in the emergency department, requiring additional sublingual nitroglycerin with relief.  Discussed case with hospitalist, patient will be admitted for unstable angina.  No evidence of dissection, pneumonia, and I doubt pulmonary embolism as the etiology.   The plan for this patient was discussed with my attending physician, Dr. Elnora Morrison, who voiced agreement and who  oversaw evaluation and treatment of this patient.   CLINICAL IMPRESSION: 1. Angina at rest [facility])      Disposition: Admit   Isaiah Teo A. Jimmye Norman, MD Resident Physician, PGY-3 Emergency Medicine [facility]    Jefm Petty, MD 03/21/19 6440    Elnora Morrison, MD 03/21/19 1257    Elnora Morrison, MD 03/28/19 [phone]

## 2019-03-19 NOTE — ED Notes (Signed)
Nurse drawing labs. 

## 2019-03-19 NOTE — ED Notes (Signed)
ED TO INPATIENT HANDOFF REPORT  ED Nurse Name and Phone #:  Isaiah Rangel 84  S Name/Age/Gender Isaiah Rangel Isaiah Rangel 58 y.o. male Room/Bed: 024C/024C  Code Status   Code Status: Prior  Home/SNF/Other Home Patient oriented to: self, place, time and situation Is this baseline? Yes   Triage Complete: Triage complete  Chief Complaint chest pain, vomiting  Triage Note Pt arrives via ems. Pt was woken from sleep with midline chest pressure that he rates 8/10. Pressure doesn't radiate. Current Smoker. Pt took nitro @ home nausea vomiting sweating at home. Pain was relieved by nitro. 324 po aspirin given by ems.    Allergies Allergies  Allergen Reactions  . Other Nausea And Vomiting and Other (See Comments)    Quail eggs caused severe stomach pain and n/v    Level of Care/Admitting Diagnosis ED Disposition    ED Disposition Condition Comment   Admit  Hospital Area: [facility] [[id number]]  Level of Care: Telemetry Cardiac [103]  I expect the patient will be discharged within 24 hours: Yes  LOW acuity---Tx typically complete <24 hrs---ACUTE conditions typically can be evaluated <24 hours---LABS likely to return to acceptable levels <24 hours---IS near functional baseline---EXPECTED to return to current living arrangement---NOT newly hypoxic: Meets criteria for 5C-Observation unit  Covid Evaluation: Asymptomatic Screening Protocol (No Symptoms)  Diagnosis: Unstable angina [facility]) [[id number]]  Admitting Physician: Elwyn Reach [[id number]]  Attending Physician: Elwyn Reach [[id number]]  PT Class (Do Not Modify): Observation [104]  PT Acc Code (Do Not Modify): Observation [10022]       B Medical/Surgery History Past Medical History:  Diagnosis Date  . Allergy   . Anxiety   . BPH (benign prostatic hyperplasia)   . COPD (chronic obstructive pulmonary disease) ([place])   . Current smoker   . OA (osteoarthritis)    Past Surgical History:  Procedure Laterality Date  . BACK  SURGERY    . CARDIAC CATHETERIZATION  04/06/13   [facility]- minor luminal irregularities, otherwise normal cors; EF > 55%. Medical management and smoking cessation recommended  . KNEE ARTHROSCOPY W/ PARTIAL MEDIAL MENISCECTOMY Right 2012   [place], 2012  . NASAL SINUS SURGERY    . neck fusion    . VASECTOMY       A IV Location/Drains/Wounds Patient Lines/Drains/Airways Status   Active Line/Drains/Airways    Name:   Placement date:   Placement time:   Site:   Days:   Peripheral IV 03/19/19 Left Antecubital   03/19/19    1919    Antecubital   less than 1          Intake/Output Last 24 hours No intake or output data in the 24 hours ending 03/19/19 2322  Labs/Imaging Results for orders placed or performed during the hospital encounter of 03/19/19 (from the past 48 hour(s))  Basic metabolic panel     Status: Abnormal   Collection Time: 03/19/19  8:05 PM  Result Value Ref Range   Sodium 139 135 - 145 mmol/L   Potassium 3.9 3.5 - 5.1 mmol/L   Chloride 103 98 - 111 mmol/L   CO2 27 22 - 32 mmol/L   Glucose, Bld 107 (H) 70 - 99 mg/dL   BUN 13 6 - 20 mg/dL   Creatinine, Ser 0.93 0.61 - 1.24 mg/dL   Calcium 9.4 8.9 - 10.3 mg/dL   GFR calc non Af Amer >60 >60 mL/min   GFR calc Af Amer >60 >60 mL/min   Anion gap 9 5 -  15    Comment: Performed at [facility], [place] [address] [place], [place] [address]  Troponin I (High Sensitivity)     Status: None   Collection Time: 03/19/19  8:05 PM  Result Value Ref Range   Troponin I (High Sensitivity) 3 <18 ng/L    Comment: (NOTE) Elevated high sensitivity troponin I (hsTnI) values and significant  changes across serial measurements may suggest ACS but many other  chronic and acute conditions are known to elevate hsTnI results.  Refer to the "Links" section for chest pain algorithms and additional  guidance. Performed at [facility], [place] [address] [place], [place] [address]   Magnesium     Status: None   Collection Time: 03/19/19   8:05 PM  Result Value Ref Range   Magnesium 2.2 1.7 - 2.4 mg/dL    Comment: Performed at [facility], [place] [address] [place], [place] [address]  Lipase, blood     Status: None   Collection Time: 03/19/19  8:05 PM  Result Value Ref Range   Lipase 33 11 - 51 U/L    Comment: Performed at [facility] [address], [place], [place] [address]  CBC with Differential/Platelet     Status: Abnormal   Collection Time: 03/19/19  8:05 PM  Result Value Ref Range   WBC 13.5 (H) 4.0 - 10.5 K/uL   RBC 4.86 4.22 - 5.81 MIL/uL   Hemoglobin 15.3 13.0 - 17.0 g/dL   HCT 45.9 39.0 - 52.0 %   MCV 94.4 80.0 - 100.0 fL   MCH 31.5 26.0 - 34.0 pg   MCHC 33.3 30.0 - 36.0 g/dL   RDW 15.6 (H) 11.5 - 15.5 %   Platelets 236 150 - 400 K/uL   nRBC 0.0 0.0 - 0.2 %   Neutrophils Relative % 63 %   Neutro Abs 8.5 (H) 1.7 - 7.7 K/uL   Lymphocytes Relative 25 %   Lymphs Abs 3.3 0.7 - 4.0 K/uL   Monocytes Relative 8 %   Monocytes Absolute 1.1 (H) 0.1 - 1.0 K/uL   Eosinophils Relative 3 %   Eosinophils Absolute 0.4 0.0 - 0.5 K/uL   Basophils Relative 1 %   Basophils Absolute 0.1 0.0 - 0.1 K/uL   Immature Granulocytes 0 %   Abs Immature Granulocytes 0.05 0.00 - 0.07 K/uL    Comment: Performed at [facility], [place] [address] [place], [place] [address]  CBG monitoring, ED     Status: None   Collection Time: 03/19/19  8:11 PM  Result Value Ref Range   Glucose-Capillary 90 70 - 99 mg/dL  Troponin I (High Sensitivity)     Status: None   Collection Time: 03/19/19 10:27 PM  Result Value Ref Range   Troponin I (High Sensitivity) 3 <18 ng/L    Comment: (NOTE) Elevated high sensitivity troponin I (hsTnI) values and significant  changes across serial measurements may suggest ACS but many other  chronic and acute conditions are known to elevate hsTnI results.  Refer to the "Links" section for chest pain algorithms and additional  guidance. Performed at [facility], [place] [address]  [place], [place] [address]    Dg Chest Portable 1 View  Result Date: 03/19/2019 CLINICAL DATA:  Central chest pain EXAM: PORTABLE CHEST 1 VIEW COMPARISON:  09/05/2018 FINDINGS: Chronic hyperinflation and central bronchial thickening. Normal heart size and mediastinal contours. No acute airspace disease, pleural effusion, pneumothorax or pulmonary  edema. No acute osseous abnormalities. Surgical hardware in the lower cervical spine partially included. IMPRESSION: Chronic hyperinflation and central bronchial thickening consistent with COPD. No superimposed acute abnormality. Electronically Signed   By: Keith Rake M.D.   On: 03/19/2019 20:08    Pending Labs Unresulted Labs (From admission, onward)    Start     Ordered   03/21/19 0500  CBC  Daily,   R     03/19/19 2310   03/20/19 0800  Heparin level (unfractionated)  Daily,   R     03/19/19 2310   03/19/19 2312  SARS Coronavirus 2 (CEPHEID - Performed in [facility] hospital lab), Hosp Order  (Asymptomatic Patients Labs)  Once,   STAT    Question:  Rule Out  Answer:  Yes   03/19/19 2311   Signed and Held  HIV antibody (Routine Testing)  Once,   R     Signed and Held          Vitals/Pain Today's Vitals   03/19/19 1919 03/19/19 1956 03/19/19 2210 03/19/19 2300  BP:  (!) 129/92 116/70 124/72  Pulse:  68 66 60  Resp:  18  17  Temp: 97.6 F (36.4 C)     TempSrc: Oral     SpO2:    94%  PainSc:        Isolation Precautions No active isolations  Medications Medications  nitroGLYCERIN (NITROSTAT) SL tablet 0.4 mg (0.4 mg Sublingual Given 03/19/19 2106)  heparin bolus via infusion 4,000 Units (has no administration in time range)  heparin ADULT infusion 100 units/mL (25000 units/210mL sodium chloride 0.45%) (has no administration in time range)    Mobility walks Low fall risk   Focused Assessments Sinus brady to NSR   R Recommendations: See Admitting Provider Note  Report given to:   Additional Notes:

## 2019-03-20 ENCOUNTER — Encounter (HOSPITAL_COMMUNITY)
Admission: EM | Disposition: A | Discharge status: Home / Self Care | Payer: Self-pay | Source: Home / Self Care | Attending: Family Medicine

## 2019-03-20 ENCOUNTER — Encounter (HOSPITAL_COMMUNITY): Payer: Self-pay | Admitting: General Practice

## 2019-03-20 ENCOUNTER — Ambulatory Visit: Payer: 59 | Admitting: Cardiovascular Disease

## 2019-03-20 ENCOUNTER — Telehealth: Payer: Self-pay | Admitting: Cardiovascular Disease

## 2019-03-20 ENCOUNTER — Inpatient Hospital Stay (HOSPITAL_COMMUNITY): Payer: 59

## 2019-03-20 DIAGNOSIS — I208 Other forms of angina pectoris: Secondary | ICD-10-CM | POA: Diagnosis present

## 2019-03-20 DIAGNOSIS — J449 Chronic obstructive pulmonary disease, unspecified: Secondary | ICD-10-CM | POA: Diagnosis present

## 2019-03-20 DIAGNOSIS — I951 Orthostatic hypotension: Secondary | ICD-10-CM | POA: Diagnosis present

## 2019-03-20 DIAGNOSIS — R079 Chest pain, unspecified: Secondary | ICD-10-CM | POA: Diagnosis not present

## 2019-03-20 DIAGNOSIS — I7300 Raynaud's syndrome without gangrene: Secondary | ICD-10-CM | POA: Diagnosis present

## 2019-03-20 DIAGNOSIS — Z716 Tobacco abuse counseling: Secondary | ICD-10-CM

## 2019-03-20 DIAGNOSIS — Z79899 Other long term (current) drug therapy: Secondary | ICD-10-CM | POA: Diagnosis not present

## 2019-03-20 DIAGNOSIS — I25110 Atherosclerotic heart disease of native coronary artery with unstable angina pectoris: Principal | ICD-10-CM

## 2019-03-20 DIAGNOSIS — I200 Unstable angina: Secondary | ICD-10-CM | POA: Diagnosis not present

## 2019-03-20 DIAGNOSIS — I10 Essential (primary) hypertension: Secondary | ICD-10-CM | POA: Diagnosis present

## 2019-03-20 DIAGNOSIS — F418 Other specified anxiety disorders: Secondary | ICD-10-CM | POA: Diagnosis present

## 2019-03-20 DIAGNOSIS — Z720 Tobacco use: Secondary | ICD-10-CM | POA: Diagnosis not present

## 2019-03-20 DIAGNOSIS — I2510 Atherosclerotic heart disease of native coronary artery without angina pectoris: Secondary | ICD-10-CM | POA: Diagnosis not present

## 2019-03-20 DIAGNOSIS — Z1159 Encounter for screening for other viral diseases: Secondary | ICD-10-CM | POA: Diagnosis not present

## 2019-03-20 DIAGNOSIS — E782 Mixed hyperlipidemia: Secondary | ICD-10-CM

## 2019-03-20 DIAGNOSIS — Z981 Arthrodesis status: Secondary | ICD-10-CM | POA: Diagnosis not present

## 2019-03-20 DIAGNOSIS — I214 Non-ST elevation (NSTEMI) myocardial infarction: Secondary | ICD-10-CM | POA: Diagnosis not present

## 2019-03-20 DIAGNOSIS — N400 Enlarged prostate without lower urinary tract symptoms: Secondary | ICD-10-CM | POA: Diagnosis present

## 2019-03-20 DIAGNOSIS — F17210 Nicotine dependence, cigarettes, uncomplicated: Secondary | ICD-10-CM | POA: Diagnosis present

## 2019-03-20 DIAGNOSIS — I739 Peripheral vascular disease, unspecified: Secondary | ICD-10-CM | POA: Diagnosis not present

## 2019-03-20 DIAGNOSIS — G4733 Obstructive sleep apnea (adult) (pediatric): Secondary | ICD-10-CM | POA: Diagnosis present

## 2019-03-20 DIAGNOSIS — K219 Gastro-esophageal reflux disease without esophagitis: Secondary | ICD-10-CM | POA: Diagnosis present

## 2019-03-20 DIAGNOSIS — Z7982 Long term (current) use of aspirin: Secondary | ICD-10-CM | POA: Diagnosis not present

## 2019-03-20 HISTORY — PX: LEFT HEART CATH AND CORONARY ANGIOGRAPHY: CATH118249

## 2019-03-20 LAB — CBC WITH DIFFERENTIAL/PLATELET
Abs Immature Granulocytes: 0.03 10*3/uL (ref 0.00–0.07)
Basophils Absolute: 0.1 10*3/uL (ref 0.0–0.1)
Basophils Relative: 1 %
Eosinophils Absolute: 0.3 10*3/uL (ref 0.0–0.5)
Eosinophils Relative: 3 %
HCT: 43.1 % (ref 39.0–52.0)
Hemoglobin: 14.7 g/dL (ref 13.0–17.0)
Immature Granulocytes: 0 %
Lymphocytes Relative: 34 %
Lymphs Abs: 3.7 10*3/uL (ref 0.7–4.0)
MCH: 31.5 pg (ref 26.0–34.0)
MCHC: 34.1 g/dL (ref 30.0–36.0)
MCV: 92.5 fL (ref 80.0–100.0)
Monocytes Absolute: 0.7 10*3/uL (ref 0.1–1.0)
Monocytes Relative: 7 %
Neutro Abs: 5.9 10*3/uL (ref 1.7–7.7)
Neutrophils Relative %: 55 %
Platelets: 236 10*3/uL (ref 150–400)
RBC: 4.66 MIL/uL (ref 4.22–5.81)
RDW: 15.7 % — ABNORMAL HIGH (ref 11.5–15.5)
WBC: 10.7 10*3/uL — ABNORMAL HIGH (ref 4.0–10.5)
nRBC: 0 % (ref 0.0–0.2)

## 2019-03-20 LAB — RENAL FUNCTION PANEL
Albumin: 3.9 g/dL (ref 3.5–5.0)
Anion gap: 9 (ref 5–15)
BUN: 16 mg/dL (ref 6–20)
CO2: 22 mmol/L (ref 22–32)
Calcium: 9.1 mg/dL (ref 8.9–10.3)
Chloride: 109 mmol/L (ref 98–111)
Creatinine, Ser: 0.87 mg/dL (ref 0.61–1.24)
GFR calc Af Amer: >60 mL/min (ref >60)
GFR calc non Af Amer: >60 mL/min (ref >60)
Glucose, Bld: 143 mg/dL — ABNORMAL HIGH (ref 70–99)
Phosphorus: 2.1 mg/dL — ABNORMAL LOW (ref 2.5–4.6)
Potassium: 3.8 mmol/L (ref 3.5–5.1)
Sodium: 140 mmol/L (ref 135–145)

## 2019-03-20 LAB — TROPONIN I (HIGH SENSITIVITY)
Troponin I (High Sensitivity): 40 ng/L — ABNORMAL HIGH (ref <18)
Troponin I (High Sensitivity): 157 ng/L (ref <18)

## 2019-03-20 LAB — SARS CORONAVIRUS 2 BY RT PCR (HOSPITAL ORDER, PERFORMED IN ~~LOC~~ HOSPITAL LAB): SARS Coronavirus 2: NEGATIVE

## 2019-03-20 LAB — HEMOGLOBIN A1C
Hgb A1c MFr Bld: 6.2 % — ABNORMAL HIGH (ref 4.8–5.6)
Mean Plasma Glucose: 131.24 mg/dL

## 2019-03-20 LAB — HIV ANTIBODY (ROUTINE TESTING W REFLEX): HIV Screen 4th Generation wRfx: NONREACTIVE

## 2019-03-20 LAB — ECHOCARDIOGRAM COMPLETE
Height: 65 in
Weight: 2097.6 oz

## 2019-03-20 LAB — HEPARIN LEVEL (UNFRACTIONATED): Heparin Unfractionated: 0.29 IU/mL — ABNORMAL LOW (ref 0.30–0.70)

## 2019-03-20 SURGERY — LEFT HEART CATH AND CORONARY ANGIOGRAPHY
Anesthesia: LOCAL

## 2019-03-20 MED ORDER — NICOTINE 21 MG/24HR TD PT24
21.0000 mg | MEDICATED_PATCH | Freq: Every day | TRANSDERMAL | Status: DC
  Administered 2019-03-21: 21 mg via TRANSDERMAL
  Filled 2019-03-20: qty 1

## 2019-03-20 MED ORDER — SODIUM CHLORIDE 0.9 % IV SOLN: 250.0000 mL | INTRAVENOUS | Status: DC | PRN

## 2019-03-20 MED ORDER — MIDAZOLAM HCL 2 MG/2ML IJ SOLN
INTRAMUSCULAR | Status: AC
  Filled 2019-03-20: qty 2

## 2019-03-20 MED ORDER — HEPARIN SODIUM (PORCINE) 1000 UNIT/ML IJ SOLN
INTRAMUSCULAR | Status: DC | PRN
  Administered 2019-03-20: 3000 [IU] via INTRAVENOUS

## 2019-03-20 MED ORDER — SODIUM CHLORIDE 0.9% FLUSH
3.0000 mL | Freq: Two times a day (BID) | INTRAVENOUS | Status: DC
  Administered 2019-03-20: 22:00:00 3 mL via INTRAVENOUS

## 2019-03-20 MED ORDER — SODIUM CHLORIDE 0.9 % WEIGHT BASED INFUSION: 3.0000 mL/kg/h | INTRAVENOUS | Status: DC

## 2019-03-20 MED ORDER — ACETAMINOPHEN 325 MG PO TABS: 650.0000 mg | ORAL_TABLET | ORAL | Status: DC | PRN

## 2019-03-20 MED ORDER — ATORVASTATIN CALCIUM 80 MG PO TABS: 80.0000 mg | ORAL_TABLET | Freq: Every day | ORAL | Status: DC

## 2019-03-20 MED ORDER — SODIUM CHLORIDE 0.9% FLUSH: 3.0000 mL | INTRAVENOUS | Status: DC | PRN

## 2019-03-20 MED ORDER — ONDANSETRON HCL 4 MG/2ML IJ SOLN: 4.0000 mg | Freq: Four times a day (QID) | INTRAMUSCULAR | Status: DC | PRN

## 2019-03-20 MED ORDER — SODIUM CHLORIDE 0.9% FLUSH
3.0000 mL | Freq: Two times a day (BID) | INTRAVENOUS | Status: DC
  Administered 2019-03-20 – 2019-03-21 (×2): 3 mL via INTRAVENOUS

## 2019-03-20 MED ORDER — LIDOCAINE HCL (PF) 1 % IJ SOLN
INTRAMUSCULAR | Status: AC
  Filled 2019-03-20: qty 30

## 2019-03-20 MED ORDER — DIAZEPAM 5 MG PO TABS: 5.0000 mg | ORAL_TABLET | Freq: Four times a day (QID) | ORAL | Status: DC | PRN

## 2019-03-20 MED ORDER — ALBUTEROL SULFATE (2.5 MG/3ML) 0.083% IN NEBU: 3.0000 mL | INHALATION_SOLUTION | Freq: Four times a day (QID) | RESPIRATORY_TRACT | Status: DC | PRN

## 2019-03-20 MED ORDER — NITROGLYCERIN 1 MG/10 ML FOR IR/CATH LAB
INTRA_ARTERIAL | Status: DC | PRN
  Administered 2019-03-20: 200 ug via INTRACORONARY

## 2019-03-20 MED ORDER — FENTANYL CITRATE (PF) 100 MCG/2ML IJ SOLN
INTRAMUSCULAR | Status: DC | PRN
  Administered 2019-03-20: 50 ug via INTRAVENOUS

## 2019-03-20 MED ORDER — NITROGLYCERIN 0.4 MG SL SUBL: 0.4000 mg | SUBLINGUAL_TABLET | SUBLINGUAL | Status: DC | PRN

## 2019-03-20 MED ORDER — NITROGLYCERIN 1 MG/10 ML FOR IR/CATH LAB
INTRA_ARTERIAL | Status: AC
  Filled 2019-03-20: qty 10

## 2019-03-20 MED ORDER — ASPIRIN EC 81 MG PO TBEC
81.0000 mg | DELAYED_RELEASE_TABLET | Freq: Every day | ORAL | Status: DC
  Administered 2019-03-20: 09:00:00 81 mg via ORAL
  Filled 2019-03-20: qty 1

## 2019-03-20 MED ORDER — NICOTINE POLACRILEX 2 MG MT GUM
2.0000 mg | CHEWING_GUM | OROMUCOSAL | Status: DC
  Administered 2019-03-20 – 2019-03-21 (×3): 2 mg via ORAL
  Filled 2019-03-20 (×9): qty 1

## 2019-03-20 MED ORDER — VERAPAMIL HCL 2.5 MG/ML IV SOLN
INTRAVENOUS | Status: AC
  Filled 2019-03-20: qty 2

## 2019-03-20 MED ORDER — HEPARIN (PORCINE) IN NACL 1000-0.9 UT/500ML-% IV SOLN
INTRAVENOUS | Status: AC
  Filled 2019-03-20: qty 1000

## 2019-03-20 MED ORDER — SODIUM CHLORIDE 0.9 % WEIGHT BASED INFUSION
1.0000 mL/kg/h | INTRAVENOUS | Status: DC
  Administered 2019-03-20: 13:00:00 1 mL/kg/h via INTRAVENOUS

## 2019-03-20 MED ORDER — ISOSORBIDE MONONITRATE ER 30 MG PO TB24: 30.0000 mg | ORAL_TABLET | Freq: Every day | ORAL | Status: DC

## 2019-03-20 MED ORDER — FENTANYL CITRATE (PF) 100 MCG/2ML IJ SOLN
INTRAMUSCULAR | Status: AC
  Filled 2019-03-20: qty 2

## 2019-03-20 MED ORDER — HYDRALAZINE HCL 20 MG/ML IJ SOLN: 10.0000 mg | INTRAMUSCULAR | Status: AC | PRN

## 2019-03-20 MED ORDER — LIDOCAINE HCL (PF) 1 % IJ SOLN
INTRAMUSCULAR | Status: DC | PRN
  Administered 2019-03-20: 2 mL

## 2019-03-20 MED ORDER — OMEGA-3-ACID ETHYL ESTERS 1 G PO CAPS
1.0000 g | ORAL_CAPSULE | Freq: Every day | ORAL | Status: DC
  Administered 2019-03-20 – 2019-03-21 (×2): 1 g via ORAL
  Filled 2019-03-20 (×2): qty 1

## 2019-03-20 MED ORDER — ESCITALOPRAM OXALATE 10 MG PO TABS
10.0000 mg | ORAL_TABLET | Freq: Every day | ORAL | Status: DC
  Administered 2019-03-20 – 2019-03-21 (×2): 10 mg via ORAL
  Filled 2019-03-20 (×2): qty 1

## 2019-03-20 MED ORDER — MIDAZOLAM HCL 2 MG/2ML IJ SOLN
INTRAMUSCULAR | Status: DC | PRN
  Administered 2019-03-20: 2 mg via INTRAVENOUS

## 2019-03-20 MED ORDER — LABETALOL HCL 5 MG/ML IV SOLN: 10.0000 mg | INTRAVENOUS | Status: AC | PRN

## 2019-03-20 MED ORDER — ADULT MULTIVITAMIN W/MINERALS CH
1.0000 | ORAL_TABLET | Freq: Every day | ORAL | Status: DC
  Administered 2019-03-20 – 2019-03-21 (×2): 1 via ORAL
  Filled 2019-03-20 (×2): qty 1

## 2019-03-20 MED ORDER — ASPIRIN 81 MG PO CHEW
81.0000 mg | CHEWABLE_TABLET | Freq: Every day | ORAL | Status: DC
  Administered 2019-03-21: 81 mg via ORAL
  Filled 2019-03-20: qty 1

## 2019-03-20 MED ORDER — NICOTINE 21 MG/24HR TD PT24
21.0000 mg | MEDICATED_PATCH | Freq: Every day | TRANSDERMAL | Status: DC
  Administered 2019-03-20: 09:00:00 21 mg via TRANSDERMAL
  Filled 2019-03-20: qty 1

## 2019-03-20 MED ORDER — VERAPAMIL HCL 2.5 MG/ML IV SOLN
INTRAVENOUS | Status: DC | PRN
  Administered 2019-03-20: 10 mL via INTRA_ARTERIAL

## 2019-03-20 MED ORDER — ISOSORBIDE MONONITRATE ER 60 MG PO TB24
60.0000 mg | ORAL_TABLET | Freq: Every day | ORAL | Status: DC
  Administered 2019-03-20: 60 mg via ORAL
  Filled 2019-03-20: qty 1

## 2019-03-20 MED ORDER — IOHEXOL 350 MG/ML SOLN
INTRAVENOUS | Status: DC | PRN
  Administered 2019-03-20: 55 mL via INTRA_ARTERIAL

## 2019-03-20 MED ORDER — HEPARIN (PORCINE) IN NACL 1000-0.9 UT/500ML-% IV SOLN
INTRAVENOUS | Status: DC | PRN
  Administered 2019-03-20 (×2): 500 mL

## 2019-03-20 MED ORDER — METOPROLOL TARTRATE 12.5 MG HALF TABLET
12.5000 mg | ORAL_TABLET | Freq: Two times a day (BID) | ORAL | Status: DC
  Administered 2019-03-20 (×2): 12.5 mg via ORAL
  Filled 2019-03-20 (×3): qty 1

## 2019-03-20 MED ORDER — SODIUM CHLORIDE 0.9 % IV SOLN: INTRAVENOUS | Status: DC

## 2019-03-20 MED ORDER — FLUDROCORTISONE ACETATE 0.1 MG PO TABS
0.1000 mg | ORAL_TABLET | Freq: Every day | ORAL | Status: DC
  Administered 2019-03-20: 22:00:00 0.1 mg via ORAL
  Filled 2019-03-20 (×2): qty 1

## 2019-03-20 MED ORDER — DEXTROSE IN LACTATED RINGERS 5 % IV SOLN: INTRAVENOUS | Status: DC

## 2019-03-20 SURGICAL SUPPLY — 11 items
BAND CMPR LRG ZPHR (HEMOSTASIS) ×1
BAND ZEPHYR COMPRESS 30 LONG (HEMOSTASIS) ×1 IMPLANT
CATH 5FR JL3.5 JR4 ANG PIG MP (CATHETERS) ×1 IMPLANT
GUIDEWIRE INQWIRE 1.5J.035X260 (WIRE) IMPLANT
INQWIRE 1.5J .035X260CM (WIRE) ×2
KIT HEART LEFT (KITS) ×2 IMPLANT
PACK CARDIAC CATHETERIZATION (CUSTOM PROCEDURE TRAY) ×2 IMPLANT
SHEATH PROBE COVER 6X72 (BAG) ×1 IMPLANT
SHEATH RAIN RADIAL 21G 6FR (SHEATH) ×1 IMPLANT
TRANSDUCER W/STOPCOCK (MISCELLANEOUS) ×2 IMPLANT
TUBING CIL FLEX 10 FLL-RA (TUBING) ×2 IMPLANT

## 2019-03-20 NOTE — Telephone Encounter (Signed)
Pt currently in hospital, appt not currently showing active.   We will need to call the patient back for TCM f/u appt after he is discharged.

## 2019-03-20 NOTE — Plan of Care (Signed)
  Problem: Activity: Goal: Ability to tolerate increased activity will improve Outcome: Completed/Met

## 2019-03-20 NOTE — Progress Notes (Signed)
Echocardiogram 2D Echocardiogram has been performed.  [place] Deputy Shimon Trowbridge 03/20/2019, 12:25 PM

## 2019-03-20 NOTE — Consult Note (Signed)
Cardiology Consultation:   Patient ID: Isaiah Rangel; [id number]; Apr 15, 1961   Admit date: 03/19/2019 Date of Consult: 03/20/2019  Primary Care Provider: Owens Loffler, MD Primary Cardiologist: Ida Rogue, MD Primary Electrophysiologist:  None    Patient Profile:   Isaiah Rangel is a 58 y.o. male with a PMH of CAD s/p LHC in 2014 with minimal non-obstructive disease, chronic chest pain, HLD, orthostatic hypotension, bipolar disorder, GERD, and tobacco abuse, who is being seen today for the evaluation of chest pain at the request of Dr. Verlon Au.  History of Present Illness:   [name] was in his usual state of health until 03/19/2019 when he was awoken from a nap with substernal chest pain. He reported associated SOB. He to SL nitro at home and shortly after developed diaphoresis, nausea, and vomiting. His wife activated EMS. He does not recall his blood pressure at that the time of their evaluation. He was given ASA 324mg  by EMS en route to the ED.    He was last evaluated by cardiology at an outpatient visit with Dr. Rockey Situ 05/2018. He was noted to be hypotensive at that visit but patient reported symptom improvement with imdur and did not want to stop this medication. He was started on florinef at that time and recommended to stop smoking. Patient was scheduled for a follow-up visit with Dr. Rockey Situ today. His last ischemic evaluation was a LHC in 2014 which showed minor luminal irregularities and no significant CAD. Last echo in 2014 showed EF 60-65% and mild TR.   At the time of this evaluation he is chest pain free. He states he was given and additional SL nitro after arrival to the ED which did not immediately take away his pain. He reported chest pain resolved sometime while he was sleeping last night. No recurrence this morning. He takes prn SL nitro for chronic chest pain which sometimes occurs with exertion. He thinks he's taken 1 tablet 1-2 times in the past 2 weeks. He  also has had increased stress recently from work and home renovations projects. His smoking has increased as well to 1.5-2 ppd. No complaints of syncope, orthopnea, PND, or LE edema.  Hospital course: VSS. Labs notable for electrolytes wnl, Cr 0.93, WBC 13.5, Hgb 15.3, PLT 236, Trop 3>3>40>157, COVID-19 negative. EKG with sinus bradycardia, rate 57, low voltage, no STE/D, no TWI; QTc 419. CXR with findings c/w COPD; no acute abnormalities. Patient was admitted to medicine and started on a heparin gtt for unstable angina. Cardiology asked to evaluate.   Past Medical History:  Diagnosis Date  . Allergy   . Anxiety   . BPH (benign prostatic hyperplasia)   . COPD (chronic obstructive pulmonary disease) ([place])   . Current smoker   . OA (osteoarthritis)     Past Surgical History:  Procedure Laterality Date  . BACK SURGERY    . CARDIAC CATHETERIZATION  04/06/13   [facility]- minor luminal irregularities, otherwise normal cors; EF > 55%. Medical management and smoking cessation recommended  . KNEE ARTHROSCOPY W/ PARTIAL MEDIAL MENISCECTOMY Right 2012   [place], 2012  . NASAL SINUS SURGERY    . neck fusion    . VASECTOMY       Home Medications:  Prior to Admission medications   Medication Sig Start Date End Date Taking? Authorizing Provider  albuterol (VENTOLIN HFA) 108 (90 Base) MCG/ACT inhaler Inhale 2 puffs into the lungs every 6 (six) hours as needed for wheezing or shortness of breath.  Yes [provider]  aspirin EC 81 MG tablet Take 81 mg by mouth daily.   Yes [provider]  escitalopram (LEXAPRO) 10 MG tablet TAKE 1 TABLET(10 MG) BY MOUTH DAILY Patient taking differently: Take 10 mg by mouth daily.  02/02/19  Yes [name], [name], MD  fludrocortisone (FLORINEF) 0.1 MG tablet Take 1 tablet (0.1 mg total) by mouth daily. Patient taking differently: Take 0.1 mg by mouth at bedtime.  05/23/18  Yes Minna Merritts, MD  isosorbide mononitrate (IMDUR) 30 MG 24 hr tablet TAKE 1  TABLET BY MOUTH  DAILY Patient taking differently: Take 30 mg by mouth at bedtime.  01/30/19  Yes Gollan, Kathlene November, MD  Multiple Vitamin (MULTIVITAMIN WITH MINERALS) TABS tablet Take 1 tablet by mouth daily.   Yes [provider]  nitroGLYCERIN (NITROSTAT) 0.4 MG SL tablet DISSOLVE 1 TABLET UNDER THE TONGUE EVERY 5 MINUTES AS NEEDED FOR CHEST PAIN Patient taking differently: Place 0.4 mg under the tongue every 5 (five) minutes as needed for chest pain.  02/14/19  Yes Gollan, Kathlene November, MD  Omega-3 Fatty Acids (FISH OIL) 1000 MG CAPS Take 1,000 mg by mouth daily.    Yes [provider]  budesonide-formoterol (SYMBICORT) 160-4.5 MCG/ACT inhaler Inhale 2 puffs into the lungs 2 (two) times daily. Patient not taking: Reported on 03/19/2019 09/05/18   Owens Loffler, MD    Inpatient Medications: Scheduled Meds: . aspirin EC  81 mg Oral Daily  . escitalopram  10 mg Oral Daily  . fludrocortisone  0.1 mg Oral QHS  . isosorbide mononitrate  30 mg Oral QHS  . metoprolol tartrate  12.5 mg Oral BID  . multivitamin with minerals  1 tablet Oral Daily  . nicotine  21 mg Transdermal Daily  . nicotine polacrilex  2 mg Oral Q4H while awake  . omega-3 acid ethyl esters  1 g Oral Daily   Continuous Infusions: . dextrose 5% lactated ringers    . heparin 900 Units/hr (03/20/19 0901)   PRN Meds: acetaminophen, albuterol, nitroGLYCERIN, ondansetron (ZOFRAN) IV  Allergies:    Allergies  Allergen Reactions  . Other Nausea And Vomiting and Other (See Comments)    Quail eggs caused severe stomach pain and n/v    Social History:   Social History   Socioeconomic History  . Marital status: Married    Spouse name: Not on file  . Number of children: Not on file  . Years of education: Not on file  . Highest education level: Not on file  Occupational History  . Occupation: [employer]  . Financial resource strain: Not on file  . Food insecurity    Worry: Not on file     Inability: Not on file  . Transportation needs    Medical: Not on file    Non-medical: Not on file  Tobacco Use  . Smoking status: Current Every Day Smoker    Packs/day: 1.50    Years: 30.00    Pack years: 45.00    Types: Cigarettes  . Smokeless tobacco: Never Used  . Tobacco comment: he is aware he needs to quit   Substance and Sexual Activity  . Alcohol use: No    Alcohol/week: 0.0 standard drinks  . Drug use: No  . Sexual activity: Yes    Partners: Female  Lifestyle  . Physical activity    Days per week: Not on file    Minutes per session: Not on file  . Stress: Not on  file  Relationships  . Social Herbalist on phone: Not on file    Gets together: Not on file    Attends religious service: Not on file    Active member of club or organization: Not on file    Attends meetings of clubs or organizations: Not on file    Relationship status: Not on file  . Intimate partner violence    Fear of current or ex partner: Not on file    Emotionally abused: Not on file    Physically abused: Not on file    Forced sexual activity: Not on file  Other Topics Concern  . 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 Arthritis Father   . Coronary artery disease Father   . Hypertension Father   . CVA Father 62  . Colon cancer Mother   . Heart attack Brother   . Heart attack Brother   . Coronary artery disease Brother 35  . Esophageal cancer Neg Hx   . Stomach cancer Neg Hx   . Rectal cancer Neg Hx      ROS:  Please see the history of present illness.   All other ROS reviewed and negative.     Physical Exam/Data:   Vitals:   03/19/19 2315 03/20/19 0302 03/20/19 0746 03/20/19 0852  BP: 107/70 (!) 145/93 116/86 128/80  Pulse: 69 66 79 66  Resp: 20 20 17    Temp:  98.1 F (36.7 C) 97.9 F (36.6 C)   TempSrc:  Oral  Oral   SpO2: 96% 96% 97%   Weight:  59.5 kg    Height:  5\' 5"  (1.651 m)      Intake/Output Summary (Last 24 hours) at 03/20/2019 1113 Last data filed at 03/20/2019 0855 Gross per 24 hour  Intake 539.07 ml  Output 0 ml  Net 539.07 ml   Filed Weights   03/20/19 0302  Weight: 59.5 kg   Body mass index is 21.82 kg/m.  General:  Well nourished, well developed, in no acute distress HEENT: sclera anicteric  Neck: no JVD Vascular: No carotid bruits; distal pulses 2+ bilaterally Cardiac:  normal S1, S2; RRR; no murmus, rubs, or gallops Lungs:  clear to auscultation bilaterally, no wheezing, rhonchi or rales  Abd: NABS, soft, nontender, no hepatomegaly Ext: no edema Musculoskeletal:  No deformities, BUE and BLE strength normal and equal Skin: warm and dry  Neuro:  CNs 2-12 intact, no focal abnormalities noted Psych:  Normal affect   EKG:  The EKG was personally reviewed and demonstrates:  sinus bradycardia, rate 57, low voltage, no STE/D, no TWI; QTc 419 Telemetry:  Telemetry was personally reviewed and demonstrates:  NSR/sinus bradycardia  Relevant CV Studies: LHC 2014: - Mild luminal irregularities without significant CAD  Echocardiogram 2014: EF 60-65%, normal RV systolic function, mild TR.   Laboratory Data:  Chemistry Recent Labs  Lab 03/19/19 2005  NA 139  K 3.9  CL 103  CO2 27  GLUCOSE 107*  BUN 13  CREATININE 0.93  CALCIUM 9.4  GFRNONAA >60  GFRAA >60  ANIONGAP 9    No results for input(s): PROT, ALBUMIN, AST, ALT, ALKPHOS, BILITOT in the last 168 hours. Hematology Recent Labs  Lab 03/19/19 2005  WBC  13.5*  RBC 4.86  HGB 15.3  HCT 45.9  MCV 94.4  MCH 31.5  MCHC 33.3  RDW 15.6*  PLT 236   Cardiac EnzymesNo results for input(s): TROPONINI in the last 168 hours. No results for input(s): TROPIPOC in the last 168 hours.  BNPNo results for input(s): BNP, PROBNP in the last 168 hours.  DDimer No results for input(s): DDIMER in the last 168 hours.   Radiology/Studies:  Dg Chest Portable 1 View  Result Date: 03/19/2019 CLINICAL DATA:  Central chest pain EXAM: PORTABLE CHEST 1 VIEW COMPARISON:  09/05/2018 FINDINGS: Chronic hyperinflation and central bronchial thickening. Normal heart size and mediastinal contours. No acute airspace disease, pleural effusion, pneumothorax or pulmonary edema. No acute osseous abnormalities. Surgical hardware in the lower cervical spine partially included. IMPRESSION: Chronic hyperinflation and central bronchial thickening consistent with COPD. No superimposed acute abnormality. Electronically Signed   By: Keith Rake M.D.   On: 03/19/2019 20:08    Assessment and Plan:   1. Chest pain in patient with non-obstructive CAD: patient presented with chest pain that woke him from sleep. EKG non-ischemic. Trop trend 3>3>40>157. He had a LHC in 2014 which showed mild luminal irregularities without significant CAD. Risk factors for progression of disease include HLD and tobacco abuse.  - Will plan for LHC today to further evaluate coronary anatomy. Keep NPO - Will check an echocardiogram - Continue heparin gtt for now - Continue ASA - Will check FLP and Hgb A1C for risk stratification - Continue imdur for now.  - Continue metoprolol tartrate 12.5mg  BID cautiously - may not tolerate given orthostatic history.   2. Orthostatic hypotension: on fludrocortisone. BP is stable this admission. Suspect diaphoresis, nausea, and vomiting after taking SL nitro was related to hypotension rather than a sequela of ACS.  - Continue fludrocortisone   3. HLD: LDL 117 07/2018 on omega 3 fatty acid. - Will check FLP in AM - Continue omega 3 - Will start atorvastatin 80mg  daily   4. Tobacco abuse: he continues to smoke 1.5-2 ppd. Had vertigo/mood changes with trial of chantix. We discussed health risks of ongoing smoking.   - Continue to encourage smoking cessation.   For questions or updates, please contact [place]  Please consult [email or web address] for contact info under Cardiology/STEMI.   Signed, Abigail Butts, PA-C  03/20/2019 11:13 AM [phone]

## 2019-03-20 NOTE — Progress Notes (Signed)
ANTICOAGULATION CONSULT NOTE - [place] for Heparin Indication: chest pain/ACS  Allergies  Allergen Reactions  . Other Nausea And Vomiting and Other (See Comments)    Quail eggs caused severe stomach pain and n/v    Patient Measurements: Height: 5\' 5"  (165.1 cm) Weight: 131 lb 1.6 oz (59.5 kg) IBW/kg (Calculated) : 61.5 Heparin Dosing Weight: 59.5 kg  Vital Signs: Temp: 97.9 F (36.6 C) (07/20 0746) Temp Source: Oral (07/20 0746) BP: 116/86 (07/20 0746) Pulse Rate: 79 (07/20 0746)  Labs: Recent Labs    03/19/19 2005 03/19/19 2227 03/20/19 0518 03/20/19 0719  HGB 15.3  --   --   --   HCT 45.9  --   --   --   PLT 236  --   --   --   HEPARINUNFRC  --   --   --  0.29*  CREATININE 0.93  --   --   --   TROPONINIHS 3 3 40* 157*    Estimated Creatinine Clearance: 72.9 mL/min (by C-G formula based on SCr of 0.93 mg/dL).   Medical History: Past Medical History:  Diagnosis Date  . Allergy   . Anxiety   . BPH (benign prostatic hyperplasia)   . COPD (chronic obstructive pulmonary disease) ([place])   . Current smoker   . OA (osteoarthritis)     Medications:  No current facility-administered medications on file prior to encounter.    Current Outpatient Medications on File Prior to Encounter  Medication Sig Dispense Refill  . albuterol (VENTOLIN HFA) 108 (90 Base) MCG/ACT inhaler Inhale 2 puffs into the lungs every 6 (six) hours as needed for wheezing or shortness of breath.    Marland Kitchen aspirin EC 81 MG tablet Take 81 mg by mouth daily.    Marland Kitchen escitalopram (LEXAPRO) 10 MG tablet TAKE 1 TABLET(10 MG) BY MOUTH DAILY (Patient taking differently: Take 10 mg by mouth daily. ) 90 tablet 1  . fludrocortisone (FLORINEF) 0.1 MG tablet Take 1 tablet (0.1 mg total) by mouth daily. (Patient taking differently: Take 0.1 mg by mouth at bedtime. ) 30 tablet 6  . isosorbide mononitrate (IMDUR) 30 MG 24 hr tablet TAKE 1 TABLET BY MOUTH  DAILY (Patient taking differently:  Take 30 mg by mouth at bedtime. ) 90 tablet 0  . Multiple Vitamin (MULTIVITAMIN WITH MINERALS) TABS tablet Take 1 tablet by mouth daily.    . nitroGLYCERIN (NITROSTAT) 0.4 MG SL tablet DISSOLVE 1 TABLET UNDER THE TONGUE EVERY 5 MINUTES AS NEEDED FOR CHEST PAIN (Patient taking differently: Place 0.4 mg under the tongue every 5 (five) minutes as needed for chest pain. ) 25 tablet 0  . Omega-3 Fatty Acids (FISH OIL) 1000 MG CAPS Take 1,000 mg by mouth daily.     . budesonide-formoterol (SYMBICORT) 160-4.5 MCG/ACT inhaler Inhale 2 puffs into the lungs 2 (two) times daily. (Patient not taking: Reported on 03/19/2019) 1 Inhaler 3     Assessment: 58 y.o. male presenting with chest pain/unstable angina. No anticoagulation PTA. Pharmacy consulted to dose heparin.  Heparin level (0.29) slightly subtherapeutic on drip rate 800 units/hr after 4000 unit bolus. CBC WNL.   Goal of Therapy:  Heparin level 0.3-0.7 units/ml Monitor platelets by anticoagulation protocol: Yes   Plan:  Increase heparin to 900 units/hr Daily heparin level and CBC  Richardine Service, PharmD PGY1 Pharmacy Resident Phone: [phone] 03/20/2019  8:48 AM  Please check [email or web address] for unit-specific pharmacy phone numbers.

## 2019-03-20 NOTE — Plan of Care (Signed)
  Problem: Education: Goal: Understanding of cardiac disease, CV risk reduction, and recovery process will improve Outcome: Progressing Goal: Individualized Educational Video(s) Outcome: Progressing   Problem: Cardiac: Goal: Ability to achieve and maintain adequate cardiovascular perfusion will improve Outcome: Progressing   Problem: Health Behavior/Discharge Planning: Goal: Ability to safely manage health-related needs after discharge will improve Outcome: Progressing   Problem: Education: Goal: Understanding of CV disease, CV risk reduction, and recovery process will improve Outcome: Progressing Goal: Individualized Educational Video(s) Outcome: Progressing   Problem: Activity: Goal: Ability to return to baseline activity level will improve Outcome: Progressing   Problem: Cardiovascular: Goal: Ability to achieve and maintain adequate cardiovascular perfusion will improve Outcome: Progressing Goal: Vascular access site(s) Level 0-1 will be maintained Outcome: Progressing   Problem: Health Behavior/Discharge Planning: Goal: Ability to safely manage health-related needs after discharge will improve Outcome: Progressing

## 2019-03-20 NOTE — Progress Notes (Signed)
CRITICAL VALUE ALERT  Critical Value:  Troponin 157  Date & Time Notied:  03/20/2019 8:56 AM  Provider Notified: Verlon Au MD

## 2019-03-20 NOTE — Progress Notes (Signed)
Spoke with patient's wife [facility]) regarding pt's plan of care with pt's permission. Questions and concerns were answered.   Pt's wife requested for MD to call her. Samtani MD paged. Reading material given to patient from references tab regarding heart cath.

## 2019-03-20 NOTE — Progress Notes (Signed)
PROGRESS NOTE  Isaiah Rangel XHB:[id number] DOB: 1961/07/22 DOA: 03/19/2019 PCP: Owens Loffler, MD  Brief History   73 ? CAD =-PCI-minimal CAD '14 Remote neck fusion '02 Tob abuse, hld Chr Angina + nitro-CT cor calcium score 135--baseline wght 144 kg Some orthostasis Colonoscopy 11/2018 3 polpys/int hemorrhoids Bipolar worse c fathers loss--more hypomainc?  Lost father then lost ~ 30 lbs wght unintenttionally  Awoke from sleep 7/19 p.m. 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 Cessation counselled--start nico replacement and gum Add chantix on d/c Hypomania with bipolar Cont lexapro 10 Orthostatic hypotension  Monitor trends--check in am --seems to be on Florinef for this indication Remote neck fusion 2002  stable GI bleed with polypectomy 11/2018  No bleed now--labs to be repeated now Leukocytosis No chills nausea vomiting likely noninfectious and probably related to stress of angina-repeating CBC stat-would not work-up further if normal   DVT prophylaxis: on iv hep Code Status: full Family Communication: none Disposition Plan: ? Per cards    Verneita Griffes, MD [facility] 7:22 AM  03/20/2019, 7:22 AM  LOS: 0 days   Consultants  Cardiology to see later today Procedures  . None  Antibiotics   . No  Interval History/Subjective  No  Objective   Vitals:  Vitals:   03/19/19 2315 03/20/19 0302  BP: 107/70 (!) 145/93  Pulse: 69 66  Resp: 20 20  Temp:  98.1 F (36.7 C)  SpO2: 96% 96%    Exam:  Awake coherent pleasant looking younger than stated age Extraocular movements intact no icterus no pallor Throat soft supple limited ROM S1-S2 no murmur EKG personally reviewed Sinus rhythm on monitors Abdomen soft no rebound Straight leg raise normal bilaterally   I have personally reviewed the following:   Today's Data  . Troponin III-->40-->157   Lab Data  . Labs pending  [employer]  . None  Imaging  . None  Cardiology Data   EKG my read shows no changes from prior one done at 2300  Other Data  . None  Scheduled Meds: . aspirin EC  81 mg Oral Daily  . escitalopram  10 mg Oral Daily  . fludrocortisone  0.1 mg Oral QHS  . isosorbide mononitrate  30 mg Oral QHS  . multivitamin with minerals  1 tablet Oral Daily  . nicotine  21 mg Transdermal Daily  . omega-3 acid ethyl esters  1 g Oral Daily   Continuous Infusions: . heparin 800 Units/hr (03/19/19 2337)    Principal Problem:   Unstable angina (HCC) Active Problems:   Mixed hyperlipidemia   GERD   Coronary artery disease involving native coronary artery of native heart without angina pectoris   COPD (chronic obstructive pulmonary disease) with chronic bronchitis ([place])   LOS: 0 days   How to contact the [facility] Attending or  Consulting provider [place] or covering provider during after hours [place], for this patient?  1. Check the care team in [facility] and look for a) attending/consulting [facility] provider listed and b) the [facility] team listed 2. Log into [email or web address] and use [place]'s universal password to access. If you do not have the password, please contact the hospital operator. 3. Locate the [facility] provider you are looking for under [facility] and page to a number that you can be directly reached.  4. If you still have difficulty reaching the provider, please page the [facility] (Director on Call) for the Hospitalists listed on amion for assistance.

## 2019-03-20 NOTE — Telephone Encounter (Signed)
Patients wife came in the hospital and is unsure if patient will be discharged today or not. She wanted to keep the appointment for today with Dr. Rockey Situ but will call back by 12 to let us know if patient will be discharging over not. Please advise if patient should reschedule or still be able to come in later today.

## 2019-03-21 ENCOUNTER — Encounter (HOSPITAL_COMMUNITY): Payer: Self-pay | Admitting: Cardiovascular Disease

## 2019-03-21 DIAGNOSIS — F17210 Nicotine dependence, cigarettes, uncomplicated: Secondary | ICD-10-CM

## 2019-03-21 DIAGNOSIS — I739 Peripheral vascular disease, unspecified: Secondary | ICD-10-CM

## 2019-03-21 LAB — CBC
HCT: 39.8 % (ref 39.0–52.0)
Hemoglobin: 13.2 g/dL (ref 13.0–17.0)
MCH: 31.5 pg (ref 26.0–34.0)
MCHC: 33.2 g/dL (ref 30.0–36.0)
MCV: 95 fL (ref 80.0–100.0)
Platelets: 223 10*3/uL (ref 150–400)
RBC: 4.19 MIL/uL — ABNORMAL LOW (ref 4.22–5.81)
RDW: 15.6 % — ABNORMAL HIGH (ref 11.5–15.5)
WBC: 12.1 10*3/uL — ABNORMAL HIGH (ref 4.0–10.5)
nRBC: 0 % (ref 0.0–0.2)

## 2019-03-21 LAB — RENAL FUNCTION PANEL
Albumin: 3.4 g/dL — ABNORMAL LOW (ref 3.5–5.0)
Anion gap: 6 (ref 5–15)
BUN: 15 mg/dL (ref 6–20)
CO2: 26 mmol/L (ref 22–32)
Calcium: 8.6 mg/dL — ABNORMAL LOW (ref 8.9–10.3)
Chloride: 109 mmol/L (ref 98–111)
Creatinine, Ser: 0.9 mg/dL (ref 0.61–1.24)
GFR calc Af Amer: >60 mL/min (ref >60)
GFR calc non Af Amer: >60 mL/min (ref >60)
Glucose, Bld: 102 mg/dL — ABNORMAL HIGH (ref 70–99)
Phosphorus: 3 mg/dL (ref 2.5–4.6)
Potassium: 4.1 mmol/L (ref 3.5–5.1)
Sodium: 141 mmol/L (ref 135–145)

## 2019-03-21 LAB — LIPID PANEL
Cholesterol: 157 mg/dL (ref 0–200)
HDL: 34 mg/dL — ABNORMAL LOW (ref >40)
LDL Cholesterol: 111 mg/dL — ABNORMAL HIGH (ref 0–99)
Total CHOL/HDL Ratio: 4.6 RATIO
Triglycerides: 60 mg/dL (ref <150)
VLDL: 12 mg/dL (ref 0–40)

## 2019-03-21 LAB — HEPARIN LEVEL (UNFRACTIONATED): Heparin Unfractionated: 0.31 IU/mL (ref 0.30–0.70)

## 2019-03-21 MED ORDER — ATORVASTATIN CALCIUM 80 MG PO TABS: 80.0000 mg | ORAL_TABLET | Freq: Every day | ORAL | 3 refills | Status: DC

## 2019-03-21 MED ORDER — NICOTINE POLACRILEX 2 MG MT GUM: 2.0000 mg | CHEWING_GUM | OROMUCOSAL | 0 refills | Status: DC

## 2019-03-21 MED ORDER — ISOSORBIDE MONONITRATE ER 60 MG PO TB24: 60.0000 mg | ORAL_TABLET | Freq: Every day | ORAL | 3 refills | Status: DC

## 2019-03-21 MED ORDER — VARENICLINE TARTRATE 0.5 MG PO TABS: ORAL_TABLET | ORAL | 0 refills | Status: DC

## 2019-03-21 MED ORDER — NICOTINE 21 MG/24HR TD PT24: 21.0000 mg | MEDICATED_PATCH | Freq: Every day | TRANSDERMAL | 0 refills | Status: DC

## 2019-03-21 NOTE — Progress Notes (Signed)
Progress Note  Patient Name: Isaiah Rangel Date of Encounter: 03/21/2019  Primary Cardiologist: Ida Rogue, MD   Subjective   Doing well this AM. Reviewed cath with him. Discussed importance of tobacco cessation given findings on cath. He tells me he also has a history of Raynaud's, showed me a pic of his fingers. It is textbook appearing. His father and son also had it.  Inpatient Medications    Scheduled Meds: . aspirin  81 mg Oral Daily  . atorvastatin  80 mg Oral q1800  . escitalopram  10 mg Oral Daily  . fludrocortisone  0.1 mg Oral QHS  . isosorbide mononitrate  60 mg Oral QHS  . multivitamin with minerals  1 tablet Oral Daily  . nicotine  21 mg Transdermal Daily  . nicotine polacrilex  2 mg Oral Q4H while awake  . omega-3 acid ethyl esters  1 g Oral Daily  . sodium chloride flush  3 mL Intravenous Q12H  . sodium chloride flush  3 mL Intravenous Q12H   Continuous Infusions: . sodium chloride     PRN Meds: sodium chloride, acetaminophen, albuterol, diazepam, ondansetron (ZOFRAN) IV, sodium chloride flush   Vital Signs    Vitals:   03/21/19 0417 03/21/19 0500 03/21/19 0855 03/21/19 1125  BP: (!) 88/63 (!) 92/54 (!) 82/59 95/62  Pulse: (!) 57  65 65  Resp:    18  Temp: 97.9 F (36.6 C)   98.4 F (36.9 C)  TempSrc: Oral   Oral  SpO2: 94%  91% 97%  Weight: 59.5 kg     Height:        Intake/Output Summary (Last 24 hours) at 03/21/2019 1133 Last data filed at 03/21/2019 1122 Gross per 24 hour  Intake 1776.98 ml  Output 400 ml  Net 1376.98 ml   Last 3 Weights 03/21/2019 03/20/2019 03/20/2019  Weight (lbs) 131 lb 3.2 oz 131 lb 2.8 oz 131 lb 1.6 oz  Weight (kg) 59.512 kg 59.5 kg 59.467 kg      Telemetry    NSR - Personally Reviewed  ECG    NSR this AM - Personally Reviewed  Physical Exam   GEN: No acute distress.   Neck: No JVD Cardiac: RRR, no murmurs, rubs, or gallops. R radial cath site c/d/i Respiratory: Clear to auscultation bilaterally.  GI: Soft, nontender, non-distended  MS: No edema; No deformity. Neuro:  Nonfocal  Psych: Normal affect   Labs    High Sensitivity Troponin:   Recent Labs  Lab 03/19/19 2005 03/19/19 2227 03/20/19 0518 03/20/19 0719  TROPONINIHS 3 3 40* 157*      Cardiac EnzymesNo results for input(s): TROPONINI in the last 168 hours. No results for input(s): TROPIPOC in the last 168 hours.   Chemistry Recent Labs  Lab 03/19/19 2005 03/20/19 0518 03/21/19 0403  NA 139 140 141  K 3.9 3.8 4.1  CL 103 109 109  CO2 27 22 26   GLUCOSE 107* 143* 102*  BUN 13 16 15   CREATININE 0.93 0.87 0.90  CALCIUM 9.4 9.1 8.6*  ALBUMIN  --  3.9 3.4*  GFRNONAA >60 >60 >60  GFRAA >60 >60 >60  ANIONGAP 9 9 6      Hematology Recent Labs  Lab 03/19/19 2005 03/20/19 0719 03/21/19 0403  WBC 13.5* 10.7* 12.1*  RBC 4.86 4.66 4.19*  HGB 15.3 14.7 13.2  HCT 45.9 43.1 39.8  MCV 94.4 92.5 95.0  MCH 31.5 31.5 31.5  MCHC 33.3 34.1 33.2  RDW 15.6* 15.7*  15.6*  PLT 236 236 223    BNPNo results for input(s): BNP, PROBNP in the last 168 hours.   DDimer No results for input(s): DDIMER in the last 168 hours.   Radiology    Dg Chest Portable 1 View  Result Date: 03/19/2019 CLINICAL DATA:  Central chest pain EXAM: PORTABLE CHEST 1 VIEW COMPARISON:  09/05/2018 FINDINGS: Chronic hyperinflation and central bronchial thickening. Normal heart size and mediastinal contours. No acute airspace disease, pleural effusion, pneumothorax or pulmonary edema. No acute osseous abnormalities. Surgical hardware in the lower cervical spine partially included. IMPRESSION: Chronic hyperinflation and central bronchial thickening consistent with COPD. No superimposed acute abnormality. Electronically Signed   By: Keith Rake M.D.   On: 03/19/2019 20:08    Cardiac Studies   Cath 03/20/19  Prox RCA lesion is 30% stenosed.  Mid RCA lesion is 70% stenosed.  1st Mrg lesion is 40% stenosed.  LV end diastolic pressure is low.    Mild to moderate CAD with a normal LAD, 40% smooth stenosis of the OM1 branch of the left circumflex coronary artery, and a dominant RCA with 30% mid narrowing, and in one view up to a 70% stenosis proximal to the acute margin.  Following IC nitroglycerin administration, the 70% stenosis improved to 40%.  LVEDP 4 mmHg  RECOMMENDATION: I suspect the patient's chest pain may be contributed by coronary vasospasm particularly exacerbated by his up to 2 pack/day cigarette smoking habit.  Smoking cessation is essential.  Since his stenosis was improved with IC nitroglycerin, recommend an initial medical therapy approach.  We will increase isosorbide to 60 mg daily.  Consider addition of amlodipine.  Recommend high potency statin therapy in attempt to induce plaque regression.    Echo 03/20/19  1. The left ventricle has low normal systolic function, with an ejection fraction of 50-55%. The cavity size was normal. Left ventricular diastolic parameters were normal.  2. The right ventricle has normal systolic function. The cavity was normal.  3. The mitral valve is grossly normal.  4. The tricuspid valve is grossly normal.  5. The aortic valve is tricuspid. No stenosis of the aortic valve.  6. The aortic root is normal in size and structure.  7. Low normal LV systolic function; trace MR and TR.  Patient Profile     58 y.o. male with a PMH of CAD s/p LHC in 2014 with minimal non-obstructive disease, chronic chest pain, HLD, orthostatic hypotension, bipolar disorder, GERD, and tobacco abuse, who is being followed for the evaluation of chest pain at the request of Dr. Verlon Au  Assessment & Plan    Chest pain in patient with non-obstructive CAD: patient presented with chest pain that woke him from sleep. EKG non-ischemic. Trop trend 3>3>40>157. He had a LHC in 2014 which showed mild luminal irregularities without significant CAD. Risk factors for progression of disease include HLD and tobacco abuse.  -  Spoke with Dr. Claiborne Billings. Cath shows RCA lesion consistent with potential spasm, as stenosis improved from 70% to 40% with nitro. Crucial to quit smoking. - Echo WNL - Continue ASA - LDL 111, goal <70. Started atorvastatin this admission for his hypercholesterolemia. - A1c 6.2 - Increased imdur to 60 mg daily - Will stop metoprolol to allow for potential uptitration of imdur or addition of amlodipine. Mildly hypotensive this AM. With Raynaud's as well would continue to optimize anti-spasm regimen as an outpatient. - needs SL NG script at discharge. Counseled that if he needs to take this, he  should lie down and also ideally elevate his feet given the vasodilation.  Orthostatic hypotension: on fludrocortisone. This history somewhat limits our medical management of his symptoms. Adds additional importance to tobacco cessation.   Tobacco abuse: he continues to smoke 1.5-2 ppd. Had vertigo/mood changes with trial of chantix. We discussed health risks of ongoing smoking.   The patient was counseled on tobacco cessation today for 9 minutes.  Counseling included reviewing the risks of smoking tobacco products, how it impacts the patient's current medical diagnoses and different strategies for quitting.  Pharmacotherapy to aid in tobacco cessation was prescribed today.  [facility] will sign off.   Medication Recommendations:  Aspirin 81 mg, atorvastatin 80 mg, imdur 60 mg, SL NG PRN. Tobacco cessation meds. Other recommendations (labs, testing, etc):  None Follow up as an outpatient:  We will arrange outpatient f/u in [place].  For questions or updates, please contact [place] Please consult [email or web address] for contact info under     Signed, Buford Dresser, MD  03/21/2019, 11:33 AM

## 2019-03-21 NOTE — Telephone Encounter (Signed)
The patient was discharged today. He will need a TCM call on 7/22.  He is scheduled for a follow up appointment on Wednesday 7/29 at 2:30 pm with Ignacia Bayley, NP.

## 2019-03-21 NOTE — Progress Notes (Signed)
ANTICOAGULATION CONSULT NOTE - Follow Up Consult  Pharmacy Consult for Heparin Indication: chest pain/ACS  Allergies  Allergen Reactions  . Other Nausea And Vomiting and Other (See Comments)    Quail eggs caused severe stomach pain and n/v    Patient Measurements: Height: 5\' 5"  (165.1 cm) Weight: 131 lb 3.2 oz (59.5 kg)(scale b) IBW/kg (Calculated) : 61.5 Heparin Dosing Weight:   Vital Signs: Temp: 97.9 F (36.6 C) (07/21 0417) Temp Source: Oral (07/21 0417) BP: 92/54 (07/21 0500) Pulse Rate: 57 (07/21 0417)  Labs: Recent Labs    03/19/19 2005 03/19/19 2227 03/20/19 0518 03/20/19 0719 03/21/19 0403  HGB 15.3  --   --  14.7 13.2  HCT 45.9  --   --  43.1 39.8  PLT 236  --   --  236 223  HEPARINUNFRC  --   --   --  0.29* 0.31  CREATININE 0.93  --  0.87  --  0.90  TROPONINIHS 3 3 40* 157*  --     Estimated Creatinine Clearance: 75.3 mL/min (by C-G formula based on SCr of 0.9 mg/dL).  Assessment: Anticoag: heparin for CP/unstable angina, HL 0.31 in goal.  - Hgb 14.7>13.2. Plts stable.  Goal of Therapy:  Heparin level 0.3-0.7 units/ml Monitor platelets by anticoagulation protocol: Yes   Plan:  Continue heparin 900 u/h Daily CBC and HL Cath   Chazz Philson S. Alford Highland, PharmD, BCPS Clinical Staff Pharmacist Eilene Ghazi Stillinger 03/21/2019,8:29 AM

## 2019-03-21 NOTE — Discharge Summary (Signed)
Physician Discharge Summary  Isaiah Rangel RCV:[id number] DOB: 1961/02/02 DOA: 03/19/2019  PCP: Owens Loffler, MD  Admit date: 03/19/2019 Discharge date: 03/21/2019  Time spent: 40 minutes  Recommendations for Outpatient Follow-up:  1. Started Chantix this admission will need titration of the same-good evidence for this and in combination of nicotine replacement therapy for smoking cessation 2. Titration of medications as outpatient by cardiology-new meds this admission include Imdur up to 60 Lipitor 80 aspirin 3. Counseled regarding following up and will need Chem-12 CBC in about 1 week--- might need work-up if continues to have leukocytosis in the outpatient setting as is a heavy smoker-my concern would be does he need a CT of the chest at some point because of his smoking history  Discharge Diagnoses:  Principal Problem:   Unstable angina ([place]) Active Problems:   Mixed hyperlipidemia   GERD   Coronary artery disease involving native coronary artery of native heart without angina pectoris   COPD (chronic obstructive pulmonary disease) with chronic bronchitis (HCC)   NSTEMI (non-ST elevated myocardial infarction) [facility])   Discharge Condition: Improved  Diet recommendation: Heart healthy  Filed Weights   03/20/19 0302 03/20/19 1200 03/21/19 0417  Weight: 59.5 kg 59.5 kg 59.5 kg    History of present illness:  57 ? CAD =-PCI-minimal CAD '14 Remote neck fusion '02 Tob abuse, hld Chr Angina + nitro-CT cor calcium score 135--baseline wght 144 kg Some orthostasis Colonoscopy 11/2018 3 polpys/int hemorrhoids Bipolar worse c fathers loss--more hypomainc?  Lost father then lost ~ 30 lbs wght unintenttionally  Awoke from sleep 7/19 p.m. 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 Cessation counselled--start nico replacement and gum  added Chantix with titration Hypomania with bipolar Cont lexapro 10 Orthostatic hypotension           Florinef as outpatient monitor Remote neck fusion 2002             stable GI bleed with polypectomy 11/2018             No bleed now--labs to be repeated now Leukocytosis probably related to thrombocytosis to some degree? No chills nausea vomiting likely noninfectious and probably related to stress of angina-repeating CBC stat-May need work-up in the outpatient setting if continues check platelet count and peripheral smear  Procedures:  Conclusion    Prox RCA lesion is 30% stenosed.  Mid RCA lesion is 70% stenosed.  1st Mrg lesion is 40% stenosed.  LV end diastolic pressure is low.   Mild to moderate CAD with a normal LAD, 40% smooth stenosis of the OM1 branch of the left circumflex coronary artery, and a dominant RCA with 30% mid narrowing, and in one view up to a 70% stenosis proximal to the acute margin.  Following IC nitroglycerin administration, the 70% stenosis improved to 40%.  LVEDP 4 mmHg  RECOMMENDATION: I suspect the patient's chest pain may be contributed by  coronary vasospasm particularly exacerbated by his up to 2 pack/day cigarette smoking habit.  Smoking cessation is essential.  Since his stenosis was improved with IC nitroglycerin, recommend an initial medical therapy approach.  We will increase isosorbide to 60 mg daily.  Consider addition of  amlodipine.  Recommend high potency statin therapy in attempt to induce plaque regression.      Consultations:  Cardiology  Discharge Exam: Vitals:   03/21/19 0855 03/21/19 1125  BP: (!) 82/59 95/62  Pulse: 65 65  Resp:  18  Temp:  98.4 F (36.9 C)  SpO2: 91% 97%   Awake alert coherent no distress looks well eating and drinking no chest pain at present time General: Alert coherent Cardiovascular: S1-S2 no murmur Respiratory: Clinically clear no added sound Missing digits   Discharge Instructions   Discharge Instructions    Diet - low sodium heart healthy   Complete by: As directed    Discharge instructions   Complete by: As directed    Absolutely try to quit smoking Noticed new medications of nicotine patch, Chantix with specialized instructions, increase in Imdur all of which have been called into your [facility] drugstore Exercise do good activity and follow-up with your cardiologist Dr. Girtha Rm on   Increase activity slowly   Complete by: As directed      Allergies as of 03/21/2019      Reactions   Other Nausea And Vomiting, Other (See Comments)   Quail eggs caused severe stomach pain and n/v      Medication List    TAKE these medications   albuterol 108 (90 Base) MCG/ACT inhaler Commonly known as: VENTOLIN HFA Inhale 2 puffs into the lungs every 6 (six) hours as needed for wheezing or shortness of breath.   aspirin EC 81 MG tablet Take 81 mg by mouth daily.   atorvastatin 80 MG tablet Commonly known as: LIPITOR Take 1 tablet (80 mg total) by mouth daily at 6 PM.   budesonide-formoterol 160-4.5 MCG/ACT inhaler Commonly known as: SYMBICORT Inhale 2 puffs into the lungs 2 (two) times daily.   escitalopram 10 MG tablet Commonly known as: LEXAPRO TAKE 1 TABLET(10 MG) BY MOUTH DAILY What changed: See the new instructions.   Fish Oil 1000 MG Caps Take 1,000 mg by mouth daily.   fludrocortisone 0.1 MG tablet Commonly known as: FLORINEF Take 1 tablet  (0.1 mg total) by mouth daily. What changed: when to take this   isosorbide mononitrate 60 MG 24 hr tablet Commonly known as: IMDUR Take 1 tablet (60 mg total) by mouth at bedtime. What changed:   medication strength  how much to take  when to take this   multivitamin with minerals Tabs tablet Take 1 tablet by mouth daily.   nicotine 21 mg/24hr patch Commonly known as: NICODERM CQ - dosed in mg/24 hours Place 1 patch (21 mg total) onto the skin daily. Start taking on: March 22, 2019   nicotine polacrilex 2 MG gum Commonly known as: NICORETTE Take 1 each (2 mg total) by mouth every 4 (four) hours while awake.   nitroGLYCERIN 0.4 MG SL tablet Commonly known as: NITROSTAT DISSOLVE 1 TABLET UNDER THE TONGUE EVERY 5 MINUTES AS NEEDED FOR CHEST PAIN What changed:   how much to take  how to take this  when to take this  reasons to take this  additional instructions   varenicline 0.5 MG tablet Commonly known as: Chantix Take 1 tablet (0.5 mg total) by mouth daily for 3 days,  THEN 1 tablet (0.5 mg total) 2 (two) times daily for 3 days, THEN 2 tablets (1 mg total) 2 (two) times daily. Start taking on: March 21, 2019      Allergies  Allergen Reactions  . Other Nausea And Vomiting and Other (See Comments)    Quail eggs caused severe stomach pain and n/v   Follow-up Information    Theora Gianotti, NP Follow up.   Specialties: Nurse Practitioner, Cardiology, Radiology Why: You have a hospital follow-up visit scheduled for 03/29/2019 at 2:30pm. Please arrive 15 minutes early for check-in. If this date/time does not work for you, please call our office to reschedule. Contact information: [place] [address] [phone]            The results of significant diagnostics from this hospitalization (including imaging, microbiology, ancillary and laboratory) are listed below for reference.    Significant Diagnostic Studies: Dg  Chest Portable 1 View  Result Date: 03/19/2019 CLINICAL DATA:  Central chest pain EXAM: PORTABLE CHEST 1 VIEW COMPARISON:  09/05/2018 FINDINGS: Chronic hyperinflation and central bronchial thickening. Normal heart size and mediastinal contours. No acute airspace disease, pleural effusion, pneumothorax or pulmonary edema. No acute osseous abnormalities. Surgical hardware in the lower cervical spine partially included. IMPRESSION: Chronic hyperinflation and central bronchial thickening consistent with COPD. No superimposed acute abnormality. Electronically Signed   By: Keith Rake M.D.   On: 03/19/2019 20:08    Microbiology: Recent Results (from the past 240 hour(s))  SARS Coronavirus 2 (CEPHEID - Performed in [facility] hospital lab), Hosp Order     Status: None   Collection Time: 03/19/19 11:13 PM   Specimen: Nasopharyngeal Swab  Result Value Ref Range Status   SARS Coronavirus 2 NEGATIVE NEGATIVE Final    Comment: (NOTE) If result is NEGATIVE SARS-CoV-2 target nucleic acids are NOT DETECTED. The SARS-CoV-2 RNA is generally detectable in upper and lower  respiratory specimens during the acute phase of infection. The lowest  concentration of SARS-CoV-2 viral copies this assay can detect is 250  copies / mL. A negative result does not preclude SARS-CoV-2 infection  and should not be used as the sole basis for treatment or other  patient management decisions.  A negative result may occur with  improper specimen collection / handling, submission of specimen other  than nasopharyngeal swab, presence of viral mutation(s) within the  areas targeted by this assay, and inadequate number of viral copies  (<250 copies / mL). A negative result must be combined with clinical  observations, patient history, and epidemiological information. If result is POSITIVE SARS-CoV-2 target nucleic acids are DETECTED. The SARS-CoV-2 RNA is generally detectable in upper and lower  respiratory specimens  dur ing the acute phase of infection.  Positive  results are indicative of active infection with SARS-CoV-2.  Clinical  correlation with patient history and other diagnostic information is  necessary to determine patient infection status.  Positive results do  not rule out bacterial infection or co-infection with other viruses. If result is PRESUMPTIVE POSTIVE SARS-CoV-2 nucleic acids MAY BE PRESENT.   A presumptive positive result was obtained on the submitted specimen  and confirmed on repeat testing.  While 2019 novel coronavirus  (SARS-CoV-2) nucleic acids may be present in the submitted sample  additional confirmatory testing may be necessary for epidemiological  and / or clinical management purposes  to differentiate between  SARS-CoV-2 and other Sarbecovirus currently known to infect humans.  If clinically indicated additional testing  with an alternate test  methodology [phone]) is advised. The SARS-CoV-2 RNA is generally  detectable in upper and lower respiratory sp ecimens during the acute  phase of infection. The expected result is Negative. Fact Sheet for Patients:  StrictlyIdeas.no Fact Sheet for Healthcare Providers: BankingDealers.co.za This test is not yet approved or cleared by the [place] FDA and has been authorized for detection and/or diagnosis of SARS-CoV-2 by FDA under an Emergency Use Authorization (EUA).  This EUA will remain in effect (meaning this test can be used) for the duration of the COVID-19 declaration under Section 564(b)(1) of the Act, 21 U.S.C. section 360bbb-3(b)(1), unless the authorization is terminated or revoked sooner. Performed at [facility], [place] [address], [place], [place] [address]      Labs: Basic Metabolic Panel: Recent Labs  Lab 03/19/19 2005 03/20/19 0518 03/21/19 0403  NA 139 140 141  K 3.9 3.8 4.1  CL 103 109 109  CO2 27 22 26   GLUCOSE 107* 143* 102*  BUN 13  16 15   CREATININE 0.93 0.87 0.90  CALCIUM 9.4 9.1 8.6*  MG 2.2  --   --   PHOS  --  2.1* 3.0   Liver Function Tests: Recent Labs  Lab 03/20/19 0518 03/21/19 0403  ALBUMIN 3.9 3.4*   Recent Labs  Lab 03/19/19 2005  LIPASE 33   No results for input(s): AMMONIA in the last 168 hours. CBC: Recent Labs  Lab 03/19/19 2005 03/20/19 0719 03/21/19 0403  WBC 13.5* 10.7* 12.1*  NEUTROABS 8.5* 5.9  --   HGB 15.3 14.7 13.2  HCT 45.9 43.1 39.8  MCV 94.4 92.5 95.0  PLT 236 236 223   Cardiac Enzymes: No results for input(s): CKTOTAL, CKMB, CKMBINDEX, TROPONINI in the last 168 hours. BNP: BNP (last 3 results) No results for input(s): BNP in the last 8760 hours.  ProBNP (last 3 results) No results for input(s): PROBNP in the last 8760 hours.  CBG: Recent Labs  Lab 03/19/19 2011  GLUCAP 90       Signed:  Nita Sells MD   [facility] 03/21/2019, 12:07 PM

## 2019-03-21 NOTE — Progress Notes (Signed)
Pt discharge instructions reviewed with pt. Pt verbalizes understanding and states he has no questions. Smoking cessation discussed with pt. Pt verbalizes understanding. Pt's wife is driving him home. Pt discharged via wheelchair. Pt is not in distress.

## 2019-03-21 NOTE — Discharge Instructions (Signed)
Angina  Angina is extreme discomfort in the chest, neck, arm, jaw, or back. The discomfort is caused by a lack of blood in the middle layer of the heart wall (myocardium). There are four types of angina:  Stable angina. This is triggered by vigorous activity or exercise. It goes away when you rest or take angina medicine.  Unstable angina. This is a warning sign and can lead to a heart attack (acute coronary syndrome). This is a medical emergency. Symptoms come at rest and last a long time.  Microvascular angina. This affects the small coronary arteries. Symptoms include feeling tired and being short of breath.  Prinzmetal or variant angina. This is caused by a tightening (spasm) of the arteries that go to your heart. What are the causes? This condition is caused by atherosclerosis. This is the buildup of fat and cholesterol (plaque) in your arteries. The plaque may narrow or block the artery. Other causes of angina include:  Sudden tightening of the muscles of the arteries in the heart (coronary spasm).  Small artery disease (microvascular dysfunction).  Problems with any of your heart valves (heart valve disease).  A tear in an artery in your heart (coronary artery dissection).  Diseases of the heart muscle (cardiomyopathy), or other heart diseases. What increases the risk? You are more likely to develop this condition if you have:  High cholesterol.  High blood pressure (hypertension).  Diabetes.  A family history of heart disease.  An inactive (sedentary) lifestyle, or you do not exercise enough.  Depression.  Had radiation treatment to the left side of your chest. Other risk factors include:  Using tobacco.  Being obese.  Eating a diet high in saturated fats.  Being exposed to high stress or triggers of stress.  Using drugs, such as cocaine. Women have a greater risk for angina if:  They are older than 3.  They have gone through menopause (are  postmenopausal). What are the signs or symptoms? Common symptoms of this condition in both men and women may include:  Chest pain, which may: ? Feel like a crushing or squeezing in the chest, or like a tightness, pressure, fullness, or heaviness in the chest. ? Last for more than a few minutes at a time, or it may stop and come back (recur) over the course of a few minutes.  Pain in the neck, arm, jaw, or back.  Unexplained heartburn or indigestion.  Shortness of breath.  Nausea.  Sudden cold sweats. Women and people with diabetes may have unusual (atypical) symptoms, such as:  Fatigue.  Unexplained feelings of nervousness or anxiety.  Unexplained weakness.  Dizziness or fainting. How is this diagnosed? This condition may be diagnosed based on:  Your symptoms and medical history.  Electrocardiogram (ECG) to measure the electrical activity in your heart.  Blood tests.  Stress test to look for signs of blockage when your heart is stressed.  CT angiogram to examine your heart and the blood flow to it.  Coronary angiogram to check your coronary arteries for blockage. How is this treated? Angina may be treated with:  Medicines to: ? Prevent blood clots and heart attack. ? Relax blood vessels and improve blood flow to the heart (nitrates). ? Reduce blood pressure, improve the pumping action of the heart, and relax blood vessels that are spasming. ? Reduce cholesterol and help treat atherosclerosis.  A procedure to widen a narrowed or blocked coronary artery (angioplasty). A mesh tube may be placed in a coronary artery to  keep it open (coronary stenting).  Surgery to allow blood to go around a blocked artery (coronary artery bypass surgery). Follow these instructions at home: Medicines  Take over-the-counter and prescription medicines only as told by your health care provider.  Do not take the following medicines unless your health care provider approves: ? NSAIDs,  such as ibuprofen or naproxen. ? Vitamin supplements that contain vitamin A, vitamin E, or both. ? Hormone replacement therapy that contains estrogen with or without progestin. Eating and drinking   Eat a heart-healthy diet. This includes plenty of fresh fruits and vegetables, whole grains, low-fat (lean) protein, and low-fat dairy products.  Follow instructions from your health care provider about eating or drinking restrictions. Activity  Follow an exercise program approved by your health care provider.  Consider joining a cardiac rehabilitation program.  Take a break when you feel fatigued. Plan rest periods in your daily activities. Lifestyle   Do not use any products that contain nicotine or tobacco, such as cigarettes, e-cigarettes, and chewing tobacco. If you need help quitting, ask your health care provider.  If your health care provider says you can drink alcohol: ? Limit how much you use to:  0-1 drink a day for nonpregnant women.  0-2 drinks a day for men. ? Be aware of how much alcohol is in your drink. In the [place] one drink equals one 12 oz bottle of beer (355 mL), one 5 oz glass of wine (148 mL), or one 1 oz glass of hard liquor (44 mL). General instructions  Maintain a healthy weight.  Learn to manage stress.  Keep your vaccinations up to date. Get the flu (influenza) vaccine every year.  Talk to your health care provider if you feel depressed. Take a depression screening test to see if you are at risk for depression.  Work with your health care provider to manage other health conditions, such as hypertension or diabetes.  Keep all follow-up visits as told by your health care provider. This is important. Get help right away if:  You have pain in your chest, neck, arm, jaw, or back, and the pain: ? Lasts more than a few minutes. ? Is recurring. ? Is not relieved by taking medicines under the tongue (sublingual nitroglycerin). ? Increases in intensity or  frequency.  You have a lot of sweating without cause.  You have unexplained: ? Heartburn or indigestion. ? Shortness of breath or difficulty breathing. ? Nausea or vomiting. ? Fatigue. ? Feelings of nervousness or anxiety. ? Weakness.  You have sudden light-headedness or dizziness.  You faint. These symptoms may represent a serious problem that is an emergency. Do not wait to see if the symptoms will go away. Get medical help right away. Call your local emergency services (911 in the [place] Do not drive yourself to the hospital. Summary  Angina is extreme discomfort in the chest, neck, arm, jaw, or back that is caused by a lack of blood in the heart wall.  There are many symptoms of angina. They include chest pain, unexplained heartburn or indigestion, sudden cold sweats, and fatigue.  Angina may be treated with behavioral changes, medicine, or surgery.  Symptoms of angina may represent an emergency. Get medical help right away. Call your local emergency services (911 in the [place] Do not drive yourself to the hospital. This information is not intended to replace advice given to you by your health care provider. Make sure you discuss any questions you have with your health care provider.  Document Released: 08/17/2005 Document Revised: 04/04/2018 Document Reviewed: 04/04/2018 Elsevier Patient Education  2020 [employer].

## 2019-03-22 NOTE — Telephone Encounter (Signed)
Patient contacted regarding discharge from [facility] on 03/22/19.  Patient understands to follow up with provider Sharolyn Douglas, NP on 03/29/19 at 2:30 am at [facility]. Patient understands discharge instructions? yes Patient understands medications and regiment? yes Patient understands to bring all medications to this visit? yes

## 2019-03-29 ENCOUNTER — Other Ambulatory Visit: Payer: Self-pay | Admitting: *Deleted

## 2019-03-29 ENCOUNTER — Ambulatory Visit (INDEPENDENT_AMBULATORY_CARE_PROVIDER_SITE_OTHER): Payer: 59 | Admitting: Nurse Practitioner

## 2019-03-29 ENCOUNTER — Other Ambulatory Visit: Payer: Self-pay

## 2019-03-29 ENCOUNTER — Encounter: Payer: Self-pay | Admitting: Nurse Practitioner

## 2019-03-29 VITALS — BP 116/66 | HR 81 | Ht 65.0 in | Wt 134.0 lb

## 2019-03-29 DIAGNOSIS — I25118 Atherosclerotic heart disease of native coronary artery with other forms of angina pectoris: Secondary | ICD-10-CM | POA: Diagnosis not present

## 2019-03-29 DIAGNOSIS — I201 Angina pectoris with documented spasm: Secondary | ICD-10-CM | POA: Diagnosis not present

## 2019-03-29 DIAGNOSIS — I951 Orthostatic hypotension: Secondary | ICD-10-CM

## 2019-03-29 DIAGNOSIS — I214 Non-ST elevation (NSTEMI) myocardial infarction: Secondary | ICD-10-CM | POA: Diagnosis not present

## 2019-03-29 DIAGNOSIS — Z720 Tobacco use: Secondary | ICD-10-CM | POA: Diagnosis not present

## 2019-03-29 DIAGNOSIS — E785 Hyperlipidemia, unspecified: Secondary | ICD-10-CM

## 2019-03-29 MED ORDER — ESCITALOPRAM OXALATE 10 MG PO TABS: ORAL_TABLET | ORAL | 1 refills | Status: DC

## 2019-03-29 NOTE — Progress Notes (Signed)
Office Visit    Patient Name: Isaiah Rangel Date of Encounter: 03/29/2019  Primary Care Provider:  Owens Loffler, MD Primary Cardiologist:  Ida Rogue, MD  Chief Complaint    58 y/o ? with a h/o nonobs CAD (2014), chronic c/p, HL, orthostatic hypotension, bipolar d/o, GERD, tob abuse, and recent NSTEMI and finding of coronary vasospasm, who presents for post-hosp f/u.  Past Medical History    Past Medical History:  Diagnosis Date   Allergy    Anxiety    BPH (benign prostatic hyperplasia)    COPD (chronic obstructive pulmonary disease) ([place])    Coronary vasospasm ([place])    a. 03/2019 Cath: RCA 63m but only 40% after intracoronary ntg-->Imdur started.   History of echocardiogram    a. 03/2019 Echo: EF 50-55%, nl RV size/fxn. Trace MR/TR.   Non-obstructive CAD (coronary artery disease)    a. 03/2013 Cath: min irregs. EF >55%; b. 03/2019 Cath: LM nl, LAD nl, LCX nl, OM1 40, RCA 30p, 88m.   OA (osteoarthritis)    Tobacco abuse    Past Surgical History:  Procedure Laterality Date   BACK SURGERY     CARDIAC CATHETERIZATION  04/06/13   [facility]- minor luminal irregularities, otherwise normal cors; EF > 55%. Medical management and smoking cessation recommended   KNEE ARTHROSCOPY W/ PARTIAL MEDIAL MENISCECTOMY Right 2012   [place], 2012   LEFT HEART CATH AND CORONARY ANGIOGRAPHY N/A 03/20/2019   Procedure: LEFT HEART CATH AND CORONARY ANGIOGRAPHY;  Surgeon: Troy Sine, MD;  Location: [facility];  Service: Cardiovascular;  Laterality: N/A;   NASAL SINUS SURGERY     neck fusion     VASECTOMY      Allergies  Allergies  Allergen Reactions   Other Nausea And Vomiting and Other (See Comments)    Quail eggs caused severe stomach pain and n/v    History of Present Illness    58 year old male with the above past medical history including nonobstructive CAD by catheterization in 2014 chronic intermittent chest pain, hyperlipidemia, orthostatic  hypotension requiring low-dose Florinef, bipolar disorder, GERD, ongoing tobacco abuse, and family history of premature coronary artery disease.  As noted, he previously underwent diagnostic catheterization August 2014 due to chest pain despite normal stress testing.  This showed minor irregularities only.  Following this, he continued to have intermittent chest discomfort multiple times a week, typically with exertion, lasting up to 5 minutes, and resolving spontaneously.  He has always been medically managed with isosorbide mononitrate.  On July 19 of this year, he awoke with chest pain that did not respond to sublingual nitroglycerin and developed nausea, vomiting, and dyspnea.  His wife called EMS.  He was taken to [facility] where his high-sensitivity troponin peaked at 157.  Given change in enzymes and symptoms, he underwent diagnostic catheterization which initially showed a 70% mid RCA stenosis however after intracoronary nitro, this dilated to only 40% stenosis.  He was therefore medically managed and isosorbide dose was increased to 60 mg daily.  He was also placed on statin therapy and advised to quit smoking.  Since his discharge, he thinks that chest pain episodes have improved as they are occurring less frequently but he still has intermittent rest and exertional chest discomfort, similar to episodes that he is had for many years.  As noted above, these typically last about 1 to 5 minutes, and resolve with rest.  He has not had anything similar to what happened on July 19.  He has not yet returned to work but is looking forward to doing so.  He denies dyspnea, palpitations, PND, orthopnea, dizziness, syncope, edema, or early satiety.  He says that following his catheterization, his forearm, which is mildly ecchymotic, hurt and then that this discomfort has moved into his bicep and shoulder.  Home Medications    Prior to Admission medications   Medication Sig Start Date End Date Taking?  Authorizing Provider  albuterol (VENTOLIN HFA) 108 (90 Base) MCG/ACT inhaler Inhale 2 puffs into the lungs every 6 (six) hours as needed for wheezing or shortness of breath.   Yes [provider]  aspirin EC 81 MG tablet Take 81 mg by mouth daily.   Yes [provider]  atorvastatin (LIPITOR) 80 MG tablet Take 1 tablet (80 mg total) by mouth daily at 6 PM. 03/21/19  Yes Nita Sells, MD  budesonide-formoterol (SYMBICORT) 160-4.5 MCG/ACT inhaler Inhale 2 puffs into the lungs 2 (two) times daily. 09/05/18  Yes [name], [name], MD  escitalopram (LEXAPRO) 10 MG tablet TAKE 1 TABLET(10 MG) BY MOUTH DAILY 03/29/19  Yes [name], [name], MD  fludrocortisone (FLORINEF) 0.1 MG tablet Take 1 tablet (0.1 mg total) by mouth daily. Patient taking differently: Take 0.1 mg by mouth at bedtime.  05/23/18  Yes Minna Merritts, MD  isosorbide mononitrate (IMDUR) 60 MG 24 hr tablet Take 1 tablet (60 mg total) by mouth at bedtime. 03/21/19  Yes Nita Sells, MD  Multiple Vitamin (MULTIVITAMIN WITH MINERALS) TABS tablet Take 1 tablet by mouth daily.   Yes [provider]  nicotine (NICODERM CQ - DOSED IN MG/24 HOURS) 21 mg/24hr patch Place 1 patch (21 mg total) onto the skin daily. 03/22/19  Yes Nita Sells, MD  nicotine polacrilex (NICORETTE) 2 MG gum Take 1 each (2 mg total) by mouth every 4 (four) hours while awake. 03/21/19  Yes Nita Sells, MD  nitroGLYCERIN (NITROSTAT) 0.4 MG SL tablet DISSOLVE 1 TABLET UNDER THE TONGUE EVERY 5 MINUTES AS NEEDED FOR CHEST PAIN Patient taking differently: Place 0.4 mg under the tongue every 5 (five) minutes as needed for chest pain.  02/14/19  Yes Gollan, Kathlene November, MD  Omega-3 Fatty Acids (FISH OIL) 1000 MG CAPS Take 1,000 mg by mouth daily.    Yes [provider]  varenicline (CHANTIX) 0.5 MG tablet Take 1 tablet (0.5 mg total) by mouth daily for 3 days, THEN 1 tablet (0.5 mg total) 2 (two) times daily for 3 days, THEN  2 tablets (1 mg total) 2 (two) times daily. Patient not taking: Reported on 03/29/2019 03/21/19 05/06/19  Nita Sells, MD    Review of Systems    Overall reduce burden of chest pain symptoms but he still notes rest and exertional chest tightness without associated symptoms lasting up to 5 minutes.  Right forearm followed by bicep and right shoulder discomfort.  He denies palpitations, dyspnea, PND, orthopnea, dizziness, syncope, edema, or early satiety.  All other systems reviewed and are otherwise negative except as noted above.  Physical Exam    VS:  BP 116/66 (BP Location: Left Arm, Patient Position: Sitting, Cuff Size: Normal)    Pulse 81    Ht 5\' 5"  (1.651 m)    Wt 134 lb (60.8 kg)    BMI 22.30 kg/m  , BMI Body mass index is 22.3 kg/m. GEN: Well nourished, well developed, in no acute distress. HEENT: normal. Neck: Supple, no JVD, carotid bruits, or masses. Cardiac: RRR, no murmurs, rubs, or gallops. No clubbing,  cyanosis, edema.  Radials/DP/PT 2+ and equal bilaterally.  Right form is mildly ecchymotic.  Normal Allen's test.   Respiratory:  Respirations regular and unlabored, clear to auscultation bilaterally. GI: Soft, nontender, nondistended, BS + x 4. MS: no deformity or atrophy. Skin: warm and dry, no rash. Neuro:  Strength and sensation are intact. Psych: Normal affect.  Accessory Clinical Findings    ECG personally reviewed by me today -regular sinus rhythm, 81 - no acute changes.  Lab Results  Component Value Date   WBC 12.1 (H) 03/21/2019   HGB 13.2 03/21/2019   HCT 39.8 03/21/2019   MCV 95.0 03/21/2019   PLT 223 03/21/2019   Lab Results  Component Value Date   CREATININE 0.90 03/21/2019   BUN 15 03/21/2019   NA 141 03/21/2019   K 4.1 03/21/2019   CL 109 03/21/2019   CO2 26 03/21/2019   Lab Results  Component Value Date   ALT 13 08/30/2018   AST 15 08/30/2018   ALKPHOS 73 08/30/2018   BILITOT 0.3 08/30/2018   Lab Results  Component Value Date    CHOL 157 03/21/2019   HDL 34 (L) 03/21/2019   LDLCALC 111 (H) 03/21/2019   LDLDIRECT 139.4 02/23/2013   TRIG 60 03/21/2019   CHOLHDL 4.6 03/21/2019     Assessment & Plan    1.  Non-STEMI, subsequent episode of care/nonobstructive CAD/coronary vasospasm: Patient was recently admitted with chest pain and ruled in for non-STEMI.  Diagnostic catheterization showed moderate nonobstructive CAD and also evidence of coronary vasospasm in the right coronary artery with a 70% stenosis being reduced to 40% simply with the administration of intracoronary nitroglycerin.  Following this, he was placed on isosorbide mononitrate 60 mg daily (increased from 30 mg daily).  Further titration or addition of amlodipine limited by history of soft blood pressures and orthostatic hypotension requiring Florinef.  Since his discharge, he has noted reduced frequency of his typical chest pain, which historically has occurred several times per week dating back to at least 2014.  He has not had any angina like what occurred prompting his presentation to the emergency room.  When he has chest pain, it might occur with rest or with exertion, last up to 15 minutes, and resolve with rest.  Pressure is 116/66 today.  We discussed options for management and I am reluctant to add low-dose amlodipine given his history of orthostasis and requirement for Florinef.  We will stick with 60 mg of isosorbide mononitrate daily and he will continue to evaluate symptoms as he returns to work next week.  We could potentially come back on his Imdur in order to make room for low-dose calcium channel blocker therapy in the future if necessary.  Alternatively, he might require more Florinef if anti-anginal therapy needs to be ramped up.  He is not on a beta-blocker in order to allow for additional blood pressure.  He remains on aspirin and statin therapy.  2.  Chronic chest pain: He has been experiencing some amount of chest pain on a regular basis dating  back to at least 2014.  It is difficult to know if coronary vasospasm could have potentially accounted for all of those episodes though there was definitely something different about his episode that prompted his presentation on July 19.  See #1.  3.  Orthostatic hypotension: Stable on low-dose Florinef and so far tolerating the higher dose of isosorbide.  4.  Hyperlipidemia: LDL was 111 during hospitalization.  Statin therapy and has been  started.  I will plan to follow-up lipids and LFTs in 1 month when he is seen back in clinic.  5.  Ongoing tobacco abuse: He continues to smoke a few cigarettes a day.  2 packs is lasted him 10 days, whereas he was smoking 2 packs a day previously.  He is using a nicotine patch and does remove it when he has a cigarette.  He tried Chantix in the past and does not want to try again because it caused mood disturbance.  He might be interested in Wellbutrin but is already taking Lexapro and I have asked him to discuss that with his primary care provider.  Complete cessation advised.  6.  Right forearm and shoulder pain: Following catheterization, he noted ecchymosis and tenderness of his right forearm.  He does have some ecchymosis there however, he has good radial and ulnar pulses with a normal Allen's test.  No swelling up the arm or in the shoulder.  I suspect his right shoulder pain is more musculoskeletal in nature as it is reproducible with positioning and palpation.  7.  Disposition: Follow-up in 1 month at which point, we can follow-up lipids and LFTs.   Murray Hodgkins, NP 03/29/2019, 4:47 PM

## 2019-03-29 NOTE — Patient Instructions (Signed)
Medication Instructions:  Your physician recommends that you continue on your current medications as directed. Please refer to the Current Medication list given to you today.  If you need a refill on your cardiac medications before your next appointment, please call your pharmacy.   Lab work: None ordered  If you have labs (blood work) drawn today and your tests are completely normal, you will receive your results only by: Marland Kitchen MyChart Message (if you have MyChart) OR . A paper copy in the mail If you have any lab test that is abnormal or we need to change your treatment, we will call you to review the results.  Testing/Procedures: None ordered   Follow-Up: At [facility], you and your health needs are our priority.  As part of our continuing mission to provide you with exceptional heart care, we have created designated Provider Care Teams.  These Care Teams include your primary Cardiologist (physician) and Advanced Practice Providers (APPs -  Physician Assistants and Nurse Practitioners) who all work together to provide you with the care you need, when you need it. You will need a follow up appointment in 1 months.  You may see Ida Rogue, MD or Murray Hodgkins, NP.

## 2019-03-31 ENCOUNTER — Other Ambulatory Visit: Payer: Self-pay

## 2019-03-31 ENCOUNTER — Other Ambulatory Visit: Payer: Self-pay | Admitting: Cardiovascular Disease

## 2019-03-31 MED ORDER — FLUDROCORTISONE ACETATE 0.1 MG PO TABS: ORAL_TABLET | ORAL | 3 refills | Status: DC

## 2019-03-31 NOTE — Telephone Encounter (Signed)
*  STAT* If patient is at the pharmacy, call can be transferred to refill team.   1. Which medications need to be refilled? (please list name of each medication and dose if known) FLUDROCORTISONE  2. Which pharmacy/location (including street and city if local pharmacy) is medication to be sent to? [place]  3. Do they need a 30 day or 90 day supply? [place]

## 2019-04-20 ENCOUNTER — Ambulatory Visit: Payer: 59 | Admitting: Nurse Practitioner

## 2019-04-22 ENCOUNTER — Other Ambulatory Visit: Payer: Self-pay | Admitting: Cardiovascular Disease

## 2019-04-24 ENCOUNTER — Telehealth: Payer: Self-pay | Admitting: Cardiovascular Disease

## 2019-04-24 NOTE — Telephone Encounter (Signed)
Patient returning call from Isaiah Rangel.

## 2019-05-03 ENCOUNTER — Ambulatory Visit (INDEPENDENT_AMBULATORY_CARE_PROVIDER_SITE_OTHER): Payer: 59 | Admitting: Nurse Practitioner

## 2019-05-03 ENCOUNTER — Other Ambulatory Visit: Payer: Self-pay

## 2019-05-03 ENCOUNTER — Encounter: Payer: Self-pay | Admitting: Nurse Practitioner

## 2019-05-03 VITALS — BP 128/64 | HR 90 | Ht 65.0 in | Wt 139.0 lb

## 2019-05-03 DIAGNOSIS — E785 Hyperlipidemia, unspecified: Secondary | ICD-10-CM | POA: Diagnosis not present

## 2019-05-03 DIAGNOSIS — I25118 Atherosclerotic heart disease of native coronary artery with other forms of angina pectoris: Secondary | ICD-10-CM | POA: Diagnosis not present

## 2019-05-03 DIAGNOSIS — I951 Orthostatic hypotension: Secondary | ICD-10-CM

## 2019-05-03 DIAGNOSIS — I201 Angina pectoris with documented spasm: Secondary | ICD-10-CM | POA: Diagnosis not present

## 2019-05-03 DIAGNOSIS — Z720 Tobacco use: Secondary | ICD-10-CM

## 2019-05-03 MED ORDER — ISOSORBIDE MONONITRATE ER 60 MG PO TB24: 30.0000 mg | ORAL_TABLET | Freq: Every day | ORAL | 3 refills | Status: DC

## 2019-05-03 MED ORDER — AMLODIPINE BESYLATE 2.5 MG PO TABS: 2.5000 mg | ORAL_TABLET | Freq: Every day | ORAL | 3 refills | Status: DC

## 2019-05-03 NOTE — Progress Notes (Signed)
Office Visit    Patient Name: Isaiah Rangel Date of Encounter: 05/03/2019  Primary Care Provider:  Owens Loffler, MD Primary Cardiologist:  Ida Rogue, MD  Chief Complaint    58 y/o ? w/ a h/o CAD status post recent non-STEMI in the setting of coronary vasospasm, chronic chest pain, hyperlipidemia, orthostatic hypotension, bipolar disorder, GERD, and tobacco abuse, who presents for follow-up for c/p.  Past Medical History    Past Medical History:  Diagnosis Date   Allergy    Anxiety    BPH (benign prostatic hyperplasia)    COPD (chronic obstructive pulmonary disease) ([place])    Coronary vasospasm ([place])    a. 03/2019 Cath: RCA 16m but only 40% after intracoronary ntg-->Imdur started.   History of echocardiogram    a. 03/2019 Echo: EF 50-55%, nl RV size/fxn. Trace MR/TR.   Non-obstructive CAD (coronary artery disease)    a. 03/2013 Cath: min irregs. EF >55%; b. 03/2019 Cath: LM nl, LAD nl, LCX nl, OM1 40, RCA 30p, 22m.   OA (osteoarthritis)    Tobacco abuse    Past Surgical History:  Procedure Laterality Date   BACK SURGERY     CARDIAC CATHETERIZATION  04/06/13   [facility]- minor luminal irregularities, otherwise normal cors; EF > 55%. Medical management and smoking cessation recommended   KNEE ARTHROSCOPY W/ PARTIAL MEDIAL MENISCECTOMY Right 2012   [place], 2012   LEFT HEART CATH AND CORONARY ANGIOGRAPHY N/A 03/20/2019   Procedure: LEFT HEART CATH AND CORONARY ANGIOGRAPHY;  Surgeon: Troy Sine, MD;  Location: [facility];  Service: Cardiovascular;  Laterality: N/A;   NASAL SINUS SURGERY     neck fusion     VASECTOMY      Allergies  Allergies  Allergen Reactions   Other Nausea And Vomiting and Other (See Comments)    Quail eggs caused severe stomach pain and n/v    History of Present Illness    The above past medical history include nonobstructive CAD by catheterization in 2014, chronic intermittent chest pain, hyperlipidemia, orthostatic  hypotension requiring Florinef therapy, bipolar disorder, GERD, ongoing tobacco abuse, family history of premature CAD.  He previously underwent diagnostic catheterization August 2014 in the setting of chest pain despite normal stress testing.  This showed minor irregularities only.  Following this, continue intermittent chest discomfort multiple times a week and was medically managed with long-acting nitrates.  In July 19 of this year, he awoke chest pain that did not respond to sublingual nitroglycerin and subsequently developed nausea, vomiting, and dyspnea.  At [facility], his high-sensitivity troponin peaked at 157.  Diagnostic catheterization initially showed a mid RCA stenosis however after intracoronary nitro the vessel dilated and only 40% stenosis was noted.  He is managed and his isosorbide dose was increased to 60 mg daily.  He was also placed on statin therapy and has quit smoking.  July 29, at which time he reported intermittent rest and exertional chest discomfort, similar to what he had experienced over the past many years.  At that point, after some discussion, we opted to continue isosorbide mononitrate 60 mg daily.  Since then however, he has continued to have intermittent substernal chest discomfort occurring at rest or with exertion.  He has had 3 episodes in the past month for which she has taken sublingual nitroglycerin.  One episode occurred after he forgot to take his isosorbide the night before.  He has not seen a significant change in symptoms since moving from 30 mg  of isosorbide daily to 60 mg following his hospitalization.  He is back at work and overall tolerates this pretty well but when he exerts, he thinks he is a little more likely to have a minor episode of chest discomfort for which he does not normally take nitroglycerin.  He denies palpitations, PND, orthopnea, syncope, edema, or early satiety.  He does have chronic orthostasis which can be worsened after taking sublingual  nitroglycerin.  Home Medications    Prior to Admission medications   Medication Sig Start Date End Date Taking? Authorizing Provider  albuterol (VENTOLIN HFA) 108 (90 Base) MCG/ACT inhaler Inhale 2 puffs into the lungs every 6 (six) hours as needed for wheezing or shortness of breath.   Yes [provider]  aspirin EC 81 MG tablet Take 81 mg by mouth daily.   Yes [provider]  atorvastatin (LIPITOR) 80 MG tablet Take 1 tablet (80 mg total) by mouth daily at 6 PM. 03/21/19  Yes Nita Sells, MD  budesonide-formoterol (SYMBICORT) 160-4.5 MCG/ACT inhaler Inhale 2 puffs into the lungs 2 (two) times daily. 09/05/18  Yes [name], [name], MD  escitalopram (LEXAPRO) 10 MG tablet TAKE 1 TABLET(10 MG) BY MOUTH DAILY 03/29/19  Yes [name], [name], MD  fludrocortisone (FLORINEF) 0.1 MG tablet TAKE 1 TABLET(0.1 MG) BY MOUTH DAILY 03/31/19  Yes Theora Gianotti, NP  isosorbide mononitrate (IMDUR) 60 MG 24 hr tablet Take 1 tablet (60 mg total) by mouth at bedtime. 03/21/19  Yes Nita Sells, MD  Multiple Vitamin (MULTIVITAMIN WITH MINERALS) TABS tablet Take 1 tablet by mouth daily.   Yes [provider]  nicotine (NICODERM CQ - DOSED IN MG/24 HOURS) 21 mg/24hr patch Place 1 patch (21 mg total) onto the skin daily. 03/22/19  Yes Nita Sells, MD  nicotine polacrilex (NICORETTE) 2 MG gum Take 1 each (2 mg total) by mouth every 4 (four) hours while awake. 03/21/19  Yes Nita Sells, MD  nitroGLYCERIN (NITROSTAT) 0.4 MG SL tablet DISSOLVE 1 TABLET UNDER THE TONGUE EVERY 5 MINUTES AS NEEDED FOR CHEST PAIN Patient taking differently: Place 0.4 mg under the tongue every 5 (five) minutes as needed for chest pain.  02/14/19  Yes Gollan, Kathlene November, MD  Omega-3 Fatty Acids (FISH OIL) 1000 MG CAPS Take 1,000 mg by mouth daily.    Yes [provider]    Review of Systems    Chest pain with above-this is typically nitrate responsive.  Chronic basis  which has been stable.  He denies dyspnea, palpitations, PND, orthopnea, syncope, edema, or early satiety.  All other systems reviewed and are otherwise negative except as noted above.  Physical Exam    VS:  BP 128/64 (BP Location: Left Arm, Patient Position: Sitting, Cuff Size: Normal)    Pulse 90    Ht 5\' 5"  (1.651 m)    Wt 139 lb (63 kg)    SpO2 94%    BMI 23.13 kg/m  , BMI Body mass index is 23.13 kg/m. GEN: Well nourished, well developed, in no acute distress. HEENT: normal. Neck: Supple, no JVD, carotid bruits, or masses. Cardiac: RRR, no murmurs, rubs, or gallops. No clubbing, cyanosis, edema.  Radials 2+ and equal bilaterally.  Respiratory:  Respirations regular and unlabored, clear to auscultation bilaterally. GI: Soft, nontender, nondistended, BS + x 4. MS: no deformity or atrophy. Skin: warm and dry, no rash. Neuro:  Strength and sensation are intact. Psych: Normal affect.  Accessory Clinical Findings    ECG personally reviewed by me  today -regular sinus rhythm, 86- no acute changes.  Lab Results  Component Value Date   WBC 12.1 (H) 03/21/2019   HGB 13.2 03/21/2019   HCT 39.8 03/21/2019   MCV 95.0 03/21/2019   PLT 223 03/21/2019   Lab Results  Component Value Date   CREATININE 0.90 03/21/2019   BUN 15 03/21/2019   NA 141 03/21/2019   K 4.1 03/21/2019   CL 109 03/21/2019   CO2 26 03/21/2019   Lab Results  Component Value Date   ALT 13 08/30/2018   AST 15 08/30/2018   ALKPHOS 73 08/30/2018   BILITOT 0.3 08/30/2018   Lab Results  Component Value Date   CHOL 157 03/21/2019   HDL 34 (L) 03/21/2019   LDLCALC 111 (H) 03/21/2019   LDLDIRECT 139.4 02/23/2013   TRIG 60 03/21/2019   CHOLHDL 4.6 03/21/2019     Assessment & Plan    1.  Coronary vasospasm/nonobstructive CAD: Status post non-STEMI in July with catheterization revealing vasospasm in the right coronary artery and otherwise nonobstructive, 40% RCA stenosis (down from 70% after intracoronary  nitroglycerin).  He has been on isosorbide mononitrate 60 mg daily since his hospitalization, which was very was daily prior to his hospitalization.  He has not noticed any significant improvement in chest pain symptoms on the higher dose.  He has had 3 episodes of nitrate responsive since his left that prompted a few of my associate and best options for management.  His wife is present with him and she is a Marine scientist.  I am going to reduce his isosorbide to 30 mg daily in order to make blood pressure room for low-dose amlodipine-2.5 mg daily.  Hopefully the combination of the two  will provide greater reduction of vasospasm than the nitrate alone.  He will monitor for symptoms of both chest pain as well as orthostasis.  If chest pain symptoms improve but orthostasis worsens consider titrating Florinef further.  He remains on aspirin and statin therapy.  He is due for follow-up lipids and I will order this today.  2.  Orthostatic hypotension: This is been stable on low-dose Florinef.  See discussion above.  3.  Hyperlipidemia: LDL was 111 during hospitalization.  He is due for follow-up lipids and LFTs and I will order today.  He will come back to the lab to have them drawn that he is not fasting today.  4.  Tobacco abuse: Still smoking a few cigarettes a day.  Complete cessation advised.  5.  Disposition: Follow-up in clinic in 1 month.  Follow-up lipids and LFTs   Murray Hodgkins, NP 05/03/2019, 4:51 PM

## 2019-05-03 NOTE — Patient Instructions (Signed)
Medication Instructions:  1- DECREASE Imdur Take 0.5 tablets (30 mg total) by mouth at bedtime 2- START Amlodipine Take 1 tablet (2.5 mg total) by mouth daily If you need a refill on your cardiac medications before your next appointment, please call your pharmacy.   Lab work: Your physician recommends that you return for lab work1 week at the [facility] mall. You will need to be fasting.  No appt is needed. Hours are M-F 7AM- 6 PM.  If you have labs (blood work) drawn today and your tests are completely normal, you will receive your results only by: Marland Kitchen MyChart Message (if you have MyChart) OR . A paper copy in the mail If you have any lab test that is abnormal or we need to change your treatment, we will call you to review the results.  Testing/Procedures: None ordered   Follow-Up: At [facility], you and your health needs are our priority.  As part of our continuing mission to provide you with exceptional heart care, we have created designated Provider Care Teams.  These Care Teams include your primary Cardiologist (physician) and Advanced Practice Providers (APPs -  Physician Assistants and Nurse Practitioners) who all work together to provide you with the care you need, when you need it. You will need a follow up appointment in 1 months.   You may see Ida Rogue, MD or Murray Hodgkins, NP.

## 2019-05-08 ENCOUNTER — Other Ambulatory Visit: Payer: Self-pay | Admitting: Cardiovascular Disease

## 2019-05-18 ENCOUNTER — Telehealth: Payer: Self-pay

## 2019-05-18 NOTE — Telephone Encounter (Signed)
Isaiah Rangel(DPR signed) who is a Marine scientist with [employer] said that she declined seeing a + covid pt in a facility due to concern of pts husband who had non ST elevation MI, non obstructive CAD and coronary vasospasm and her daughter who had 1/2 pancreas removed and splenectomy. Now [employer] is going to require Isaiah Rangel to get a form filled out since she declined seeing a + covid pt due to health concerns of Isaiah Rangel and not wanting to work with a + covid pt. Isaiah Rangel does not have the form yet and is not sure what filling out the form entails. Isaiah Rangel will bring the form by [facility] when she gets it for Dr Lorelei Pont to review and see if he can complete form and sign for Isaiah Rangel's work.

## 2019-05-19 NOTE — Telephone Encounter (Signed)
Isaiah Rangel notified by telephone.  She will drop paperwork by the office this afternoon.

## 2019-05-19 NOTE — Telephone Encounter (Signed)
No problem.

## 2019-05-23 NOTE — Telephone Encounter (Signed)
Paperwork in Dr [employer] in box

## 2019-05-30 ENCOUNTER — Other Ambulatory Visit: Payer: Self-pay

## 2019-05-30 ENCOUNTER — Other Ambulatory Visit
Admission: RE | Admit: 2019-05-30 | Discharge: 2019-05-30 | Disposition: A | Discharge status: Home / Self Care | Payer: 59 | Attending: Nurse Practitioner | Admitting: Nurse Practitioner

## 2019-05-30 ENCOUNTER — Encounter: Payer: Self-pay | Admitting: Nurse Practitioner

## 2019-05-30 DIAGNOSIS — I25118 Atherosclerotic heart disease of native coronary artery with other forms of angina pectoris: Secondary | ICD-10-CM | POA: Diagnosis not present

## 2019-05-30 LAB — LIPID PANEL
Cholesterol: 123 mg/dL (ref 0–200)
HDL: 41 mg/dL (ref >40)
LDL Cholesterol: 73 mg/dL (ref 0–99)
Total CHOL/HDL Ratio: 3 RATIO
Triglycerides: 43 mg/dL (ref <150)
VLDL: 9 mg/dL (ref 0–40)

## 2019-05-30 LAB — HEPATIC FUNCTION PANEL
ALT: 29 U/L (ref 0–44)
AST: 28 U/L (ref 15–41)
Albumin: 4.3 g/dL (ref 3.5–5.0)
Alkaline Phosphatase: 84 U/L (ref 38–126)
Bilirubin, Direct: <0.1 mg/dL (ref 0.0–0.2)
Total Bilirubin: 0.5 mg/dL (ref 0.3–1.2)
Total Protein: 6.7 g/dL (ref 6.5–8.1)

## 2019-06-05 NOTE — Progress Notes (Signed)
Patient ID: Isaiah LOUDIN, male   DOB: 1960-10-20, 58 y.o.   MRN: [id number]   Cardiology Office Note  Date:  06/06/2019   ID:  Isaiah Rangel, DOB 1960/12/13, MRN [id number]  PCP:  Owens Loffler, MD   Chief Complaint  Patient presents with  . Other    1 month follow up. Meds reviewed verbally with patient.     HPI:  58 year old gentleman who is very active at baseline who works as a Company secretary, smoking Who continues to smoke   GERD,  remote neck injury with fusion surgery in 2002 with chronic neck pain and tightness,  chronic chest pain symptoms, relieved with nitroglycerin previous cardiac catheterization August 2014 showing minimal coronary artery disease,  History of orthostasis, relieved with Florinef CT coronary calcium score reviewed with him, score 135 presenting for follow-up of chest pain symptoms  Recent evaluation in the hospital for non-STEMI in the setting of coronary vasospasm High-sensitivity troponin from 3 up to 40 up to 157 -Cardiac catheterization showing Mild to moderate CAD with a normal LAD, 40% smooth stenosis of the OM1 branch of the left circumflex coronary artery, and a dominant RCA with 30% mid narrowing, and in one view up to a 70% stenosis proximal to the acute margin. down to 40% after intracoronary nitroglycerin  03/2019 Echo: EF 50-55%, nl RV size/fxn. Trace MR/TR.  Also has been started on amlodipine 2.5 mg daily Reports having rare episodes of chest pain, often relieved with sublingual nitroglycerin Took NTG last week, chest pain resolved   reports he can have a good day with no symptoms, very active at work, moves [employer], very heavy items weighing several 100 pounds.  Sometimes unable to do very much secondary to chest pain symptoms  Trying to cut back on his smoking  EKG personally reviewed by myself on todays visit Shows normal sinus rhythm with rate 74 bpm no significant ST or T-wave changes  Other past medical history  reviewed In the ER 11/2017 for chest pain and diarrhea Hospital records reviewed with the patient in detail diagnosed with enteritis on CT and discharged on antibiotics.  continued profuse and frequent diarrhea.  feeling lightheaded, developed chest pains around 4 AM, took a nitroglycerin tablet but the chest pain remains so he took a second nitroglycerin tablet at that time he had a syncopal episode.  Previous GI workup was essentially negative, had EGD In the past, Ran out of isosorbide, symptoms became worse  Previously tried Levsin  PMH:   has a past medical history of Allergy, Anxiety, BPH (benign prostatic hyperplasia), COPD (chronic obstructive pulmonary disease) ([place]), Coronary vasospasm ([place]), History of echocardiogram, Non-obstructive CAD (coronary artery disease), OA (osteoarthritis), and Tobacco abuse.  PSH:    Past Surgical History:  Procedure Laterality Date  . BACK SURGERY    . CARDIAC CATHETERIZATION  04/06/13   [facility]- minor luminal irregularities, otherwise normal cors; EF > 55%. Medical management and smoking cessation recommended  . KNEE ARTHROSCOPY W/ PARTIAL MEDIAL MENISCECTOMY Right 2012   [place], 2012  . LEFT HEART CATH AND CORONARY ANGIOGRAPHY N/A 03/20/2019   Procedure: LEFT HEART CATH AND CORONARY ANGIOGRAPHY;  Surgeon: Troy Sine, MD;  Location: [facility];  Service: Cardiovascular;  Laterality: N/A;  . NASAL SINUS SURGERY    . neck fusion    . VASECTOMY      Current Outpatient Medications  Medication Sig Dispense Refill  . albuterol (VENTOLIN HFA) 108 (90 Base) MCG/ACT inhaler Inhale 2  puffs into the lungs every 6 (six) hours as needed for wheezing or shortness of breath.    Marland Kitchen amLODipine (NORVASC) 2.5 MG tablet Take 1 tablet (2.5 mg total) by mouth daily. 90 tablet 3  . aspirin EC 81 MG tablet Take 81 mg by mouth daily.    Marland Kitchen atorvastatin (LIPITOR) 80 MG tablet Take 1 tablet (80 mg total) by mouth daily at 6 PM. 30 tablet 3  . budesonide-formoterol  (SYMBICORT) 160-4.5 MCG/ACT inhaler Inhale 2 puffs into the lungs 2 (two) times daily. 1 Inhaler 3  . escitalopram (LEXAPRO) 10 MG tablet TAKE 1 TABLET(10 MG) BY MOUTH DAILY 90 tablet 1  . fludrocortisone (FLORINEF) 0.1 MG tablet TAKE 1 TABLET(0.1 MG) BY MOUTH DAILY 90 tablet 3  . isosorbide mononitrate (IMDUR) 60 MG 24 hr tablet Take 0.5 tablets (30 mg total) by mouth at bedtime. 30 tablet 3  . Multiple Vitamin (MULTIVITAMIN WITH MINERALS) TABS tablet Take 1 tablet by mouth daily.    . nicotine (NICODERM CQ - DOSED IN MG/24 HOURS) 21 mg/24hr patch Place 1 patch (21 mg total) onto the skin daily. 28 patch 0  . nicotine polacrilex (NICORETTE) 2 MG gum Take 1 each (2 mg total) by mouth every 4 (four) hours while awake. 100 tablet 0  . nitroGLYCERIN (NITROSTAT) 0.4 MG SL tablet DISSOLVE 1 TABLET UNDER THE TONGUE EVERY 5 MINUTES AS NEEDED FOR CHEST PAIN (Patient taking differently: Place 0.4 mg under the tongue every 5 (five) minutes as needed for chest pain. ) 25 tablet 0  . Omega-3 Fatty Acids (FISH OIL) 1000 MG CAPS Take 1,000 mg by mouth daily.      No current facility-administered medications for this visit.      Allergies:   Other   Social History:  The patient  reports that he has been smoking cigarettes. He has a 45.00 pack-year smoking history. He has never used smokeless tobacco. He reports that he does not drink alcohol or use drugs.   Family History:   family history includes Arthritis in his father; CVA (age of onset: 9) in his father; Colon cancer in his mother; Coronary artery disease in his father; Coronary artery disease (age of onset: 49) in his brother; Heart attack in his brother and brother; Hypertension in his father.    Review of Systems: Review of Systems  Constitutional: Negative.   HENT: Negative.   Respiratory: Negative.   Cardiovascular: Positive for chest pain.  Gastrointestinal: Negative.   Musculoskeletal: Negative.   Neurological: Negative.    Psychiatric/Behavioral: Negative.   All other systems reviewed and are negative.    PHYSICAL EXAM: VS:  BP 118/66 (BP Location: Left Arm, Patient Position: Sitting, Cuff Size: Normal)   Pulse 66   Ht 5\' 5"  (1.651 m)   Wt 138 lb 4 oz (62.7 kg)   BMI 23.01 kg/m  , BMI Body mass index is 23.01 kg/m. Constitutional:  oriented to person, place, and time. No distress.  HENT:  Head: Grossly normal Eyes:  no discharge. No scleral icterus.  Neck: No JVD, no carotid bruits  Cardiovascular: Regular rate and rhythm, no murmurs appreciated Pulmonary/Chest: Clear to auscultation bilaterally, no wheezes or rails Abdominal: Soft.  no distension.  no tenderness.  Musculoskeletal: Normal range of motion Neurological:  normal muscle tone. Coordination normal. No atrophy Skin: Skin warm and dry Psychiatric: normal affect, pleasant   Recent Labs: 03/19/2019: Magnesium 2.2 03/21/2019: BUN 15; Creatinine, Ser 0.90; Hemoglobin 13.2; Platelets 223; Potassium 4.1; Sodium 141 05/30/2019:  ALT 29    Lipid Panel Lab Results  Component Value Date   CHOL 123 05/30/2019   HDL 41 05/30/2019   LDLCALC 73 05/30/2019   TRIG 43 05/30/2019      Wt Readings from Last 3 Encounters:  06/06/19 138 lb 4 oz (62.7 kg)  05/03/19 139 lb (63 kg)  03/29/19 134 lb (60.8 kg)    ASSESSMENT AND PLAN:  NSTEMI, CAD with chronic stable angina Recent catheterization in the hospital, confirmed diagnosed with coronary spasm  Tobacco abuse We have encouraged him to continue to work on weaning his cigarettes and smoking cessation. He will continue to work on this and does not want any assistance with chantix.   Orthostasis Reports that he feels better taking Florinef 0.1 mg daily.  This is allowing titration of his amlodipine,  Imdur Currently reports no significant near syncope symptoms  Hyperlipidemia Previously declined statin Currently tolerating Lipitor 80 daily   Disposition:   F/U 12 months   Total  encounter time more than 25 minutes  Greater than 50% was spent in counseling and coordination of care with the patient    No orders of the defined types were placed in this encounter.     Signed, Esmond Plants, M.D., Ph.D. 06/06/2019  [place], [place]

## 2019-06-06 ENCOUNTER — Ambulatory Visit (INDEPENDENT_AMBULATORY_CARE_PROVIDER_SITE_OTHER): Payer: 59 | Admitting: Cardiovascular Disease

## 2019-06-06 ENCOUNTER — Other Ambulatory Visit: Payer: Self-pay

## 2019-06-06 ENCOUNTER — Encounter: Payer: Self-pay | Admitting: Cardiovascular Disease

## 2019-06-06 VITALS — BP 118/66 | HR 66 | Ht 65.0 in | Wt 138.2 lb

## 2019-06-06 DIAGNOSIS — I951 Orthostatic hypotension: Secondary | ICD-10-CM

## 2019-06-06 DIAGNOSIS — I214 Non-ST elevation (NSTEMI) myocardial infarction: Secondary | ICD-10-CM | POA: Diagnosis not present

## 2019-06-06 DIAGNOSIS — I25118 Atherosclerotic heart disease of native coronary artery with other forms of angina pectoris: Secondary | ICD-10-CM

## 2019-06-06 DIAGNOSIS — Z720 Tobacco use: Secondary | ICD-10-CM

## 2019-06-06 DIAGNOSIS — G8929 Other chronic pain: Secondary | ICD-10-CM

## 2019-06-06 DIAGNOSIS — E785 Hyperlipidemia, unspecified: Secondary | ICD-10-CM | POA: Diagnosis not present

## 2019-06-06 DIAGNOSIS — R079 Chest pain, unspecified: Secondary | ICD-10-CM | POA: Diagnosis not present

## 2019-06-06 MED ORDER — NITROGLYCERIN 0.4 MG SL SUBL: SUBLINGUAL_TABLET | SUBLINGUAL | 3 refills | Status: AC

## 2019-06-06 NOTE — Patient Instructions (Signed)

## 2019-06-07 ENCOUNTER — Telehealth: Payer: Self-pay | Admitting: Cardiovascular Disease

## 2019-06-07 NOTE — Telephone Encounter (Signed)
Dr. Rockey Situ completed the patient's "Critical Illness Claim Form" and asked that I contact the patient to let him know this was done and ready for pick up.  I have spoken with the patient and he is aware his form is complete and that additional testing (labs/ ekg's/ cath report) have been attached as well. He will come by the office this afternoon to pick up these documents.   The patient was very appreciative for the call back.

## 2019-06-12 NOTE — Addendum Note (Signed)
Addended by: Janan Ridge on: 06/12/2019 01:58 PM   Modules accepted: Orders

## 2019-07-17 ENCOUNTER — Other Ambulatory Visit: Payer: Self-pay

## 2019-07-17 MED ORDER — AMLODIPINE BESYLATE 2.5 MG PO TABS: 2.5000 mg | ORAL_TABLET | Freq: Every day | ORAL | 3 refills | Status: DC

## 2019-07-17 NOTE — Telephone Encounter (Signed)
Refill sent for Amlodipine 2.5 mg

## 2019-09-05 ENCOUNTER — Telehealth: Payer: Self-pay | Admitting: Family Medicine

## 2019-09-06 NOTE — Telephone Encounter (Signed)
Please schedule CPE with fasting lab prior with Dr. [name].  

## 2019-09-08 NOTE — Telephone Encounter (Signed)
2/4 labs 2/8 cpx  Pt aware

## 2019-09-16 ENCOUNTER — Encounter (HOSPITAL_COMMUNITY): Payer: Self-pay | Admitting: Emergency Medicine

## 2019-09-16 ENCOUNTER — Emergency Department (HOSPITAL_COMMUNITY): Payer: 59

## 2019-09-16 ENCOUNTER — Observation Stay (HOSPITAL_COMMUNITY)
Admission: EM | Admit: 2019-09-16 | Discharge: 2019-09-17 | Disposition: A | Discharge status: Home / Self Care | Payer: 59 | Attending: Internal Medicine | Admitting: Internal Medicine

## 2019-09-16 DIAGNOSIS — Z9114 Patient's other noncompliance with medication regimen: Secondary | ICD-10-CM | POA: Insufficient documentation

## 2019-09-16 DIAGNOSIS — F419 Anxiety disorder, unspecified: Secondary | ICD-10-CM | POA: Diagnosis not present

## 2019-09-16 DIAGNOSIS — N400 Enlarged prostate without lower urinary tract symptoms: Secondary | ICD-10-CM | POA: Insufficient documentation

## 2019-09-16 DIAGNOSIS — M1990 Unspecified osteoarthritis, unspecified site: Secondary | ICD-10-CM | POA: Insufficient documentation

## 2019-09-16 DIAGNOSIS — E782 Mixed hyperlipidemia: Secondary | ICD-10-CM | POA: Diagnosis not present

## 2019-09-16 DIAGNOSIS — N529 Male erectile dysfunction, unspecified: Secondary | ICD-10-CM | POA: Diagnosis not present

## 2019-09-16 DIAGNOSIS — F17210 Nicotine dependence, cigarettes, uncomplicated: Secondary | ICD-10-CM | POA: Insufficient documentation

## 2019-09-16 DIAGNOSIS — Z7951 Long term (current) use of inhaled steroids: Secondary | ICD-10-CM | POA: Diagnosis not present

## 2019-09-16 DIAGNOSIS — I2510 Atherosclerotic heart disease of native coronary artery without angina pectoris: Secondary | ICD-10-CM | POA: Insufficient documentation

## 2019-09-16 DIAGNOSIS — R079 Chest pain, unspecified: Principal | ICD-10-CM | POA: Insufficient documentation

## 2019-09-16 DIAGNOSIS — Z8249 Family history of ischemic heart disease and other diseases of the circulatory system: Secondary | ICD-10-CM | POA: Diagnosis not present

## 2019-09-16 DIAGNOSIS — Z79899 Other long term (current) drug therapy: Secondary | ICD-10-CM | POA: Diagnosis not present

## 2019-09-16 DIAGNOSIS — J449 Chronic obstructive pulmonary disease, unspecified: Secondary | ICD-10-CM | POA: Insufficient documentation

## 2019-09-16 DIAGNOSIS — Z20822 Contact with and (suspected) exposure to covid-19: Secondary | ICD-10-CM | POA: Insufficient documentation

## 2019-09-16 DIAGNOSIS — Z7982 Long term (current) use of aspirin: Secondary | ICD-10-CM | POA: Insufficient documentation

## 2019-09-16 DIAGNOSIS — I252 Old myocardial infarction: Secondary | ICD-10-CM | POA: Diagnosis not present

## 2019-09-16 DIAGNOSIS — K219 Gastro-esophageal reflux disease without esophagitis: Secondary | ICD-10-CM | POA: Insufficient documentation

## 2019-09-16 LAB — CBC
HCT: 39.3 % (ref 39.0–52.0)
Hemoglobin: 13.1 g/dL (ref 13.0–17.0)
MCH: 31.6 pg (ref 26.0–34.0)
MCHC: 33.3 g/dL (ref 30.0–36.0)
MCV: 94.9 fL (ref 80.0–100.0)
Platelets: 217 10*3/uL (ref 150–400)
RBC: 4.14 MIL/uL — ABNORMAL LOW (ref 4.22–5.81)
RDW: 16.4 % — ABNORMAL HIGH (ref 11.5–15.5)
WBC: 11.4 10*3/uL — ABNORMAL HIGH (ref 4.0–10.5)
nRBC: 0.2 % (ref 0.0–0.2)

## 2019-09-16 MED ORDER — SODIUM CHLORIDE 0.9% FLUSH
3.0000 mL | Freq: Once | INTRAVENOUS | Status: AC
  Administered 2019-09-17: 01:00:00 3 mL via INTRAVENOUS

## 2019-09-16 NOTE — ED Triage Notes (Signed)
Pt transported from home by [facility] c/o L sided cp x 1 week, since then had intermittent pain with emesis, weakness.  Hx NSTEMI. # 16 L FA, radiated to back

## 2019-09-17 ENCOUNTER — Other Ambulatory Visit: Payer: Self-pay

## 2019-09-17 ENCOUNTER — Observation Stay (HOSPITAL_BASED_OUTPATIENT_CLINIC_OR_DEPARTMENT_OTHER): Payer: 59

## 2019-09-17 DIAGNOSIS — E782 Mixed hyperlipidemia: Secondary | ICD-10-CM

## 2019-09-17 DIAGNOSIS — R079 Chest pain, unspecified: Secondary | ICD-10-CM

## 2019-09-17 DIAGNOSIS — I2510 Atherosclerotic heart disease of native coronary artery without angina pectoris: Secondary | ICD-10-CM

## 2019-09-17 LAB — CBC
HCT: 35.7 % — ABNORMAL LOW (ref 39.0–52.0)
Hemoglobin: 11.9 g/dL — ABNORMAL LOW (ref 13.0–17.0)
MCH: 31.5 pg (ref 26.0–34.0)
MCHC: 33.3 g/dL (ref 30.0–36.0)
MCV: 94.4 fL (ref 80.0–100.0)
Platelets: 198 10*3/uL (ref 150–400)
RBC: 3.78 MIL/uL — ABNORMAL LOW (ref 4.22–5.81)
RDW: 16.3 % — ABNORMAL HIGH (ref 11.5–15.5)
WBC: 12.5 10*3/uL — ABNORMAL HIGH (ref 4.0–10.5)
nRBC: 0 % (ref 0.0–0.2)

## 2019-09-17 LAB — BASIC METABOLIC PANEL
Anion gap: 8 (ref 5–15)
BUN: 14 mg/dL (ref 6–20)
CO2: 25 mmol/L (ref 22–32)
Calcium: 8.4 mg/dL — ABNORMAL LOW (ref 8.9–10.3)
Chloride: 108 mmol/L (ref 98–111)
Creatinine, Ser: 0.81 mg/dL (ref 0.61–1.24)
GFR calc Af Amer: >60 mL/min (ref >60)
GFR calc non Af Amer: >60 mL/min (ref >60)
Glucose, Bld: 110 mg/dL — ABNORMAL HIGH (ref 70–99)
Potassium: 5.3 mmol/L — ABNORMAL HIGH (ref 3.5–5.1)
Sodium: 141 mmol/L (ref 135–145)

## 2019-09-17 LAB — COMPREHENSIVE METABOLIC PANEL
ALT: 15 U/L (ref 0–44)
AST: 15 U/L (ref 15–41)
Albumin: 3.2 g/dL — ABNORMAL LOW (ref 3.5–5.0)
Alkaline Phosphatase: 68 U/L (ref 38–126)
Anion gap: 8 (ref 5–15)
BUN: 12 mg/dL (ref 6–20)
CO2: 25 mmol/L (ref 22–32)
Calcium: 8.4 mg/dL — ABNORMAL LOW (ref 8.9–10.3)
Chloride: 110 mmol/L (ref 98–111)
Creatinine, Ser: 0.76 mg/dL (ref 0.61–1.24)
GFR calc Af Amer: >60 mL/min (ref >60)
GFR calc non Af Amer: >60 mL/min (ref >60)
Glucose, Bld: 107 mg/dL — ABNORMAL HIGH (ref 70–99)
Potassium: 4 mmol/L (ref 3.5–5.1)
Sodium: 143 mmol/L (ref 135–145)
Total Bilirubin: 0.6 mg/dL (ref 0.3–1.2)
Total Protein: 5.3 g/dL — ABNORMAL LOW (ref 6.5–8.1)

## 2019-09-17 LAB — TROPONIN I (HIGH SENSITIVITY)
Troponin I (High Sensitivity): <2 ng/L (ref <18)
Troponin I (High Sensitivity): <2 ng/L (ref <18)
Troponin I (High Sensitivity): <2 ng/L (ref <18)

## 2019-09-17 LAB — LIPID PANEL
Cholesterol: 143 mg/dL (ref 0–200)
HDL: 33 mg/dL — ABNORMAL LOW (ref >40)
LDL Cholesterol: 100 mg/dL — ABNORMAL HIGH (ref 0–99)
Total CHOL/HDL Ratio: 4.3 RATIO
Triglycerides: 52 mg/dL (ref <150)
VLDL: 10 mg/dL (ref 0–40)

## 2019-09-17 LAB — ECHOCARDIOGRAM COMPLETE
Height: 65 in
Weight: 2204.6 oz

## 2019-09-17 LAB — SARS CORONAVIRUS 2 (TAT 6-24 HRS): SARS Coronavirus 2: NEGATIVE

## 2019-09-17 MED ORDER — ADULT MULTIVITAMIN W/MINERALS CH
1.0000 | ORAL_TABLET | Freq: Every day | ORAL | Status: DC
  Administered 2019-09-17: 09:00:00 1 via ORAL
  Filled 2019-09-17: qty 1

## 2019-09-17 MED ORDER — ISOSORBIDE MONONITRATE ER 30 MG PO TB24: 30.0000 mg | ORAL_TABLET | Freq: Every day | ORAL | Status: DC

## 2019-09-17 MED ORDER — ACETAMINOPHEN 325 MG PO TABS: 650.0000 mg | ORAL_TABLET | Freq: Four times a day (QID) | ORAL | Status: DC | PRN

## 2019-09-17 MED ORDER — AMLODIPINE BESYLATE 2.5 MG PO TABS
2.5000 mg | ORAL_TABLET | Freq: Every day | ORAL | Status: DC
  Administered 2019-09-17: 2.5 mg via ORAL
  Filled 2019-09-17: qty 1

## 2019-09-17 MED ORDER — ATORVASTATIN CALCIUM 80 MG PO TABS: 80.0000 mg | ORAL_TABLET | Freq: Every day | ORAL | 0 refills | Status: DC

## 2019-09-17 MED ORDER — NICOTINE 21 MG/24HR TD PT24
21.0000 mg | MEDICATED_PATCH | Freq: Every day | TRANSDERMAL | Status: DC
  Administered 2019-09-17: 09:00:00 21 mg via TRANSDERMAL
  Filled 2019-09-17: qty 1

## 2019-09-17 MED ORDER — ATORVASTATIN CALCIUM 80 MG PO TABS: 80.0000 mg | ORAL_TABLET | Freq: Every day | ORAL | Status: DC

## 2019-09-17 MED ORDER — ALBUTEROL SULFATE (2.5 MG/3ML) 0.083% IN NEBU: 3.0000 mL | INHALATION_SOLUTION | Freq: Four times a day (QID) | RESPIRATORY_TRACT | Status: DC | PRN

## 2019-09-17 MED ORDER — SODIUM CHLORIDE 0.9 % IV BOLUS (SEPSIS)
1000.0000 mL | Freq: Once | INTRAVENOUS | Status: AC
  Administered 2019-09-17: 01:00:00 1000 mL via INTRAVENOUS

## 2019-09-17 MED ORDER — MOMETASONE FURO-FORMOTEROL FUM 200-5 MCG/ACT IN AERO
2.0000 | INHALATION_SPRAY | Freq: Two times a day (BID) | RESPIRATORY_TRACT | Status: DC
  Administered 2019-09-17: 09:00:00 2 via RESPIRATORY_TRACT
  Filled 2019-09-17: qty 8.8

## 2019-09-17 MED ORDER — FLUDROCORTISONE ACETATE 0.1 MG PO TABS
0.1000 mg | ORAL_TABLET | Freq: Every day | ORAL | Status: DC
  Administered 2019-09-17: 09:00:00 0.1 mg via ORAL
  Filled 2019-09-17 (×2): qty 1

## 2019-09-17 MED ORDER — ASPIRIN EC 81 MG PO TBEC
81.0000 mg | DELAYED_RELEASE_TABLET | Freq: Every day | ORAL | Status: DC
  Administered 2019-09-17: 09:00:00 81 mg via ORAL
  Filled 2019-09-17: qty 1

## 2019-09-17 MED ORDER — OMEGA-3-ACID ETHYL ESTERS 1 G PO CAPS
1.0000 g | ORAL_CAPSULE | Freq: Every day | ORAL | Status: DC
  Administered 2019-09-17: 09:00:00 1 g via ORAL
  Filled 2019-09-17: qty 1

## 2019-09-17 MED ORDER — NICOTINE POLACRILEX 2 MG MT GUM
2.0000 mg | CHEWING_GUM | OROMUCOSAL | Status: DC
  Administered 2019-09-17: 09:00:00 2 mg via ORAL
  Filled 2019-09-17 (×9): qty 1

## 2019-09-17 MED ORDER — ESCITALOPRAM OXALATE 10 MG PO TABS
10.0000 mg | ORAL_TABLET | Freq: Every day | ORAL | Status: DC
  Administered 2019-09-17: 09:00:00 10 mg via ORAL
  Filled 2019-09-17: qty 1

## 2019-09-17 MED ORDER — ENOXAPARIN SODIUM 40 MG/0.4ML ~~LOC~~ SOLN
40.0000 mg | SUBCUTANEOUS | Status: DC
  Administered 2019-09-17: 09:00:00 40 mg via SUBCUTANEOUS
  Filled 2019-09-17: qty 0.4

## 2019-09-17 MED ORDER — ACETAMINOPHEN 650 MG RE SUPP: 650.0000 mg | Freq: Four times a day (QID) | RECTAL | Status: DC | PRN

## 2019-09-17 MED ORDER — SODIUM CHLORIDE 0.9 % IV SOLN: INTRAVENOUS | Status: DC

## 2019-09-17 NOTE — Discharge Summary (Signed)
Physician Discharge Summary  Isaiah Rangel [id number] DOB: 04-13-1961 DOA: 09/16/2019  PCP: Owens Loffler, MD  Admit date: 09/16/2019 Discharge date: 09/17/2019  Admitted From: Home Discharge disposition: Home  Recommendations for Outpatient Follow-Up:   1. Smoking cessation 2. Encourage patient to take medications daily 3. Wife states patient has a lot of anxiety, questional change versus titration of depression but defer to PCP   Discharge Diagnosis:   Principal Problem:   Chest pain Active Problems:   Mixed hyperlipidemia   Coronary artery disease involving native coronary artery of native heart without angina pectoris    Discharge Condition: Improved.  Diet recommendation: Low sodium, heart healthy.    Wound care: None.  Code status: Full.   History of Present Illness:    Isaiah Rangel  is a 59 y.o. male,  w anxiety, smoker, Copd not on home o2,  CAD, s/p catheterization 03/20/19-> prox RCA 30%, mid RCA 70% , 1st mrg 40%, presents with c/o left sided chest pain for about 1 week.  It started when he was playing with his pitbulll,  Constant,  Pt thought that he had pulled a muscle.  Pt states that the pain yesterday move to the center of his chest and was associated with upset stomach and this prompted him to present to the ED for evaluation.  Pt notes slightly worse than baseline dyspnea.  Pt denies fever, chills, palp, abd pain, diarrhea, brbpr, black stool, dysuria, hematuria.  Pt took 4 aspirin at home.  Pt didn't try any nitro slg.  Pt states he is currently free of central chest pain, but still has nagging left sided chest pain .  Apparently pt forgot to take Imdur for the past 2 nites     Hospital Course by Problem:   Chest pain secondary to ? Vasospasm due to noncompliance w imdur Nonocclusive CAD Cath => 03/20/19-> prox RCA 30%, mid RCA 70% , 1st mrg 40%,  Troponins negative Encourage patient to stop smoking Cont aspirin Wife notes that pt  noncompliant with imdur the past 2 nites Will restart Imdur 30mg  po qday Cont Amlodipine 2.5mg  po  qday Cont Lipitor 80mg  po qhs Echo done and unrevealing Suspect this episode was related to missing his Imdur and not taking a statin either  Copd Cont Ventolin HFA 2puff q6h prn  Cont Symbicort 160/4.5 -> Dulera 2puff bid  Anxiety Cont Lexapro 10mg  po qday Consider titration outpatient  Tobacco abuse Encourage cessation    Medical Consultants:      Discharge Exam:   Vitals:   09/17/19 0413 09/17/19 0843  BP: 94/71 (!) 107/51  Pulse: 66 66  Resp: 17   Temp: 98.1 F (36.7 C)   SpO2: 95%    Vitals:   09/17/19 0253 09/17/19 0413 09/17/19 0737 09/17/19 0843  BP: 95/61 94/71  (!) 107/51  Pulse: 75 66  66  Resp: 15 17    Temp:  98.1 F (36.7 C)    TempSrc:  Oral    SpO2: 95% 95%    Weight:   62.5 kg   Height:        General exam: Appears calm and comfortable.   The results of significant diagnostics from this hospitalization (including imaging, microbiology, ancillary and laboratory) are listed below for reference.     Procedures and Diagnostic Studies:   DG Chest 2 View  Result Date: 09/17/2019 CLINICAL DATA:  Chest pain for 1 week. EXAM: CHEST - 2 VIEW COMPARISON:  Radiograph 03/19/2019  FINDINGS: The cardiomediastinal contours are normal. Hyperinflation is unchanged. Pulmonary vasculature is normal. No consolidation, pleural effusion, or pneumothorax. No acute osseous abnormalities are seen. Degenerative change in the spine. IMPRESSION: Stable hyperinflation without acute abnormality. Electronically Signed   By: Keith Rake M.D.   On: 09/17/2019 00:02   ECHOCARDIOGRAM COMPLETE  Result Date: 09/17/2019   ECHOCARDIOGRAM REPORT   Patient Name:   Isaiah Rangel Date of Exam: 09/17/2019 Medical Rec #:  [id number]       Height:       65.0 in Accession #:    [id number]      Weight:       137.8 lb Date of Birth:  25-Jan-1961        BSA:          1.69 m Patient Age:     67 years        BP:           107/51 mmHg Patient Gender: M               HR:           66 bpm. Exam Location:  Inpatient Procedure: 2D Echo, Cardiac Doppler and Color Doppler Indications:    R07.9* Chest pain, unspecified  History:        Patient has prior history of Echocardiogram examinations, most                 recent 03/20/2019. CAD and Previous Myocardial Infarction, COPD,                 Signs/Symptoms:Chest Pain; Risk Factors:Dyslipidemia and Current                 Smoker.  Sonographer:    Roseanna Rainbow RDCS Referring Phys: [place]  1. Left ventricular ejection fraction, by visual estimation, is 60 to 65%. The left ventricle has normal function. There is mildly increased left ventricular hypertrophy.  2. Left ventricular diastolic parameters are consistent with Grade I diastolic dysfunction (impaired relaxation).  3. The left ventricle has no regional wall motion abnormalities.  4. Global right ventricle has normal systolic function.The right ventricular size is normal. Mildly increased right ventricular wall thickness.  5. Left atrial size was normal.  6. Right atrial size was normal.  7. The mitral valve is grossly normal. Trivial mitral valve regurgitation.  8. The tricuspid valve is grossly normal.  9. The aortic valve is normal in structure. Aortic valve regurgitation is not visualized. Mild aortic valve sclerosis without stenosis. 10. The pulmonic valve was grossly normal. Pulmonic valve regurgitation is not visualized. 11. The inferior vena cava is dilated in size with >50% respiratory variability, suggesting right atrial pressure of 8 mmHg. FINDINGS  Left Ventricle: Left ventricular ejection fraction, by visual estimation, is 60 to 65%. The left ventricle has normal function. The left ventricle has no regional wall motion abnormalities. There is mildly increased left ventricular hypertrophy. Left ventricular diastolic parameters are consistent with Grade I diastolic dysfunction  (impaired relaxation). Indeterminate filling pressures. Right Ventricle: The right ventricular size is normal. Mildly increased right ventricular wall thickness. Global RV systolic function is has normal systolic function. Left Atrium: Left atrial size was normal in size. Right Atrium: Right atrial size was normal in size Pericardium: There is no evidence of pericardial effusion. Mitral Valve: The mitral valve is grossly normal. Trivial mitral valve regurgitation. Tricuspid Valve: The tricuspid valve is grossly normal. Tricuspid valve regurgitation is trivial. Aortic  Valve: The aortic valve is normal in structure. Aortic valve regurgitation is not visualized. Mild aortic valve sclerosis is present, with no evidence of aortic valve stenosis. Pulmonic Valve: The pulmonic valve was grossly normal. Pulmonic valve regurgitation is not visualized. Pulmonic regurgitation is not visualized. Aorta: The aortic root and ascending aorta are structurally normal, with no evidence of dilitation. Venous: The inferior vena cava is dilated in size with greater than 50% respiratory variability, suggesting right atrial pressure of 8 mmHg. IAS/Shunts: No atrial level shunt detected by color flow Doppler.  LEFT VENTRICLE PLAX 2D LVIDd:         4.00 cm       Diastology LVIDs:         2.80 cm       LV e' lateral:   12.90 cm/s LV PW:         1.10 cm       LV E/e' lateral: 7.3 LV IVS:        1.10 cm       LV e' medial:    9.03 cm/s LVOT diam:     1.80 cm       LV E/e' medial:  10.5 LV SV:         40 ml LV SV Index:   23.82 LVOT Area:     2.54 cm  LV Volumes (MOD) LV area d, A2C:    20.70 cm LV area d, A4C:    25.20 cm LV area s, A2C:    11.40 cm LV area s, A4C:    14.60 cm LV major d, A2C:   7.14 cm LV major d, A4C:   7.26 cm LV major s, A2C:   6.08 cm LV major s, A4C:   6.12 cm LV vol d, MOD A2C: 50.3 ml LV vol d, MOD A4C: 72.9 ml LV vol s, MOD A2C: 18.3 ml LV vol s, MOD A4C: 29.8 ml LV SV MOD A2C:     32.0 ml LV SV MOD A4C:     72.9  ml LV SV MOD BP:      37.6 ml RIGHT VENTRICLE             IVC RV S prime:     14.30 cm/s  IVC diam: 2.80 cm TAPSE (M-mode): 2.2 cm LEFT ATRIUM           Index       RIGHT ATRIUM          Index LA diam:      1.60 cm 0.95 cm/m  RA Area:     8.27 cm LA Vol (A2C): 21.7 ml 12.85 ml/m RA Volume:   14.40 ml 8.53 ml/m LA Vol (A4C): 17.9 ml 10.60 ml/m  AORTIC VALVE LVOT Vmax:   131.00 cm/s LVOT Vmean:  90.900 cm/s LVOT VTI:    0.283 m  AORTA Ao Root diam: 3.40 cm MITRAL VALVE MV Area (PHT): 4.06 cm             SHUNTS MV PHT:        54.23 msec           Systemic VTI:  0.28 m MV Decel Time: 187 msec             Systemic Diam: 1.80 cm MV E velocity: 94.60 cm/s 103 cm/s MV A velocity: 62.40 cm/s 70.3 cm/s MV E/A ratio:  1.52       1.5  Lyman Bishop MD Electronically signed by Lyman Bishop  MD Signature Date/Time: 09/17/2019/11:57:08 AM    Final      Labs:   Basic Metabolic Panel: Recent Labs  Lab 09/16/19 2341 09/17/19 0521  NA 141 143  K 5.3* 4.0  CL 108 110  CO2 25 25  GLUCOSE 110* 107*  BUN 14 12  CREATININE 0.81 0.76  CALCIUM 8.4* 8.4*   GFR Estimated Creatinine Clearance: 86.5 mL/min (by C-G formula based on SCr of 0.76 mg/dL). Liver Function Tests: Recent Labs  Lab 09/17/19 0521  AST 15  ALT 15  ALKPHOS 68  BILITOT 0.6  PROT 5.3*  ALBUMIN 3.2*   No results for input(s): LIPASE, AMYLASE in the last 168 hours. No results for input(s): AMMONIA in the last 168 hours. Coagulation profile No results for input(s): INR, PROTIME in the last 168 hours.  CBC: Recent Labs  Lab 09/16/19 2341 09/17/19 0521  WBC 11.4* 12.5*  HGB 13.1 11.9*  HCT 39.3 35.7*  MCV 94.9 94.4  PLT 217 198   Cardiac Enzymes: No results for input(s): CKTOTAL, CKMB, CKMBINDEX, TROPONINI in the last 168 hours. BNP: Invalid input(s): POCBNP CBG: No results for input(s): GLUCAP in the last 168 hours. D-Dimer No results for input(s): DDIMER in the last 72 hours. Hgb A1c No results for input(s): HGBA1C in  the last 72 hours. Lipid Profile Recent Labs    09/17/19 0521  CHOL 143  HDL 33*  LDLCALC 100*  TRIG 52  CHOLHDL 4.3   Thyroid function studies No results for input(s): TSH, T4TOTAL, T3FREE, THYROIDAB in the last 72 hours.  Invalid input(s): FREET3 Anemia work up No results for input(s): VITAMINB12, FOLATE, FERRITIN, TIBC, IRON, RETICCTPCT in the last 72 hours. Microbiology Recent Results (from the past 240 hour(s))  SARS CORONAVIRUS 2 (TAT 6-24 HRS) Nasopharyngeal Nasopharyngeal Swab     Status: None   Collection Time: 09/17/19  1:19 AM   Specimen: Nasopharyngeal Swab  Result Value Ref Range Status   SARS Coronavirus 2 NEGATIVE NEGATIVE Final    Comment: (NOTE) SARS-CoV-2 target nucleic acids are NOT DETECTED. The SARS-CoV-2 RNA is generally detectable in upper and lower respiratory specimens during the acute phase of infection. Negative results do not preclude SARS-CoV-2 infection, do not rule out co-infections with other pathogens, and should not be used as the sole basis for treatment or other patient management decisions. Negative results must be combined with clinical observations, patient history, and epidemiological information. The expected result is Negative. Fact Sheet for Patients: SugarRoll.be Fact Sheet for Healthcare Providers: https://www.woods-mathews.com/ This test is not yet approved or cleared by the [place] FDA and  has been authorized for detection and/or diagnosis of SARS-CoV-2 by FDA under an Emergency Use Authorization (EUA). This EUA will remain  in effect (meaning this test can be used) for the duration of the COVID-19 declaration under Section 56 4(b)(1) of the Act, 21 U.S.C. section 360bbb-3(b)(1), unless the authorization is terminated or revoked sooner. Performed at [facility], [place] [address] [place],  [address]      Discharge Instructions:   Discharge Instructions     Diet - low sodium heart healthy   Complete by: As directed    Discharge instructions   Complete by: As directed    Echo done your heart pumps well Need to stop smoking!  Also be sure to take your medications daily-- try not to miss doses!   Increase activity slowly   Complete by: As directed      Allergies as of 09/17/2019  Reactions   Other Nausea And Vomiting, Other (See Comments)   Quail eggs caused severe stomach pain and n/v      Medication List    TAKE these medications   albuterol 108 (90 Base) MCG/ACT inhaler Commonly known as: VENTOLIN HFA Inhale 2 puffs into the lungs every 6 (six) hours as needed for wheezing or shortness of breath.   amLODipine 2.5 MG tablet Commonly known as: NORVASC Take 1 tablet (2.5 mg total) by mouth daily.   aspirin EC 81 MG tablet Take 81 mg by mouth daily.   atorvastatin 80 MG tablet Commonly known as: LIPITOR Take 1 tablet (80 mg total) by mouth daily at 6 PM.   budesonide-formoterol 160-4.5 MCG/ACT inhaler Commonly known as: SYMBICORT Inhale 2 puffs into the lungs 2 (two) times daily.   escitalopram 10 MG tablet Commonly known as: LEXAPRO TAKE 1 TABLET BY MOUTH  DAILY   Fish Oil 1000 MG Caps Take 1,000 mg by mouth daily.   fludrocortisone 0.1 MG tablet Commonly known as: FLORINEF TAKE 1 TABLET(0.1 MG) BY MOUTH DAILY   isosorbide mononitrate 60 MG 24 hr tablet Commonly known as: IMDUR Take 0.5 tablets (30 mg total) by mouth at bedtime.   multivitamin with minerals Tabs tablet Take 1 tablet by mouth daily.   nicotine 21 mg/24hr patch Commonly known as: NICODERM CQ - dosed in mg/24 hours Place 1 patch (21 mg total) onto the skin daily.   nicotine polacrilex 2 MG gum Commonly known as: NICORETTE Take 1 each (2 mg total) by mouth every 4 (four) hours while awake.   nitroGLYCERIN 0.4 MG SL tablet Commonly known as: NITROSTAT DISSOLVE 1 TABLET UNDER THE TONGUE EVERY 5 MINUTES AS NEEDED FOR CHEST PAIN       Follow-up Information    [name], Spencer, MD Follow up in 1 week(s).   Specialties: Family Medicine, Sports Medicine Contact information: [place] [address] [phone]        Minna Merritts, MD. Schedule an appointment as soon as possible for a visit.   Specialty: Cardiology Contact information: [place] [address] [phone]            Time coordinating discharge: 25 min  Signed:  Geradine Girt DO  [facility] 09/17/2019, 12:13 PM

## 2019-09-17 NOTE — Progress Notes (Signed)
Pt IV removed, catheter intact and telemetry removed  Pt discharge education provided at bedside  Pt has all belongings  Pt discharged via wheelchair with RN

## 2019-09-17 NOTE — ED Provider Notes (Signed)
[facility] EMERGENCY DEPARTMENT Provider Note   CSN: [id number] Arrival date & time: 09/16/19  2334     History Chief Complaint  Patient presents with  . Chest Pain    Isaiah Rangel is a 59 y.o. male.  The history is provided by the patient.  Chest Pain Pain location:  L chest Pain quality: pressure   Radiates to: back. Pain severity:  Moderate Onset quality:  Sudden Duration:  2 hours Timing:  Constant Progression:  Improving Chronicity:  New Relieved by:  Aspirin Worsened by:  Nothing Associated symptoms: diaphoresis, shortness of breath and vomiting   Associated symptoms: no abdominal pain and no syncope   Patient history of CAD presents with chest pain.  He reports for the past week he has had episodes of chest pain. Tonight he had an episode of chest pain approximate 2 hours prior to arrival.  Reports he had onset of nausea, vomiting/diaphoresis.  The pain did go to his back.  It feels similar to prior episodes of chest pain. He reports taken aspirin is now feeling improved.     Past Medical History:  Diagnosis Date  . Allergy   . Anxiety   . BPH (benign prostatic hyperplasia)   . COPD (chronic obstructive pulmonary disease) ([place])   . Coronary vasospasm ([place])    a. 03/2019 Cath: RCA 58m but only 40% after intracoronary ntg-->Imdur started.  Marland Kitchen History of echocardiogram    a. 03/2019 Echo: EF 50-55%, nl RV size/fxn. Trace MR/TR.  . Non-obstructive CAD (coronary artery disease)    a. 03/2013 Cath: min irregs. EF >55%; b. 03/2019 Cath: LM nl, LAD nl, LCX nl, OM1 40, RCA 30p, 19m-->40% after IC NTG.  . OA (osteoarthritis)   . Tobacco abuse     Patient Active Problem List   Diagnosis Date Noted  . NSTEMI (non-ST elevated myocardial infarction) ([place]) 03/20/2019  . Unstable angina ([place]) 03/19/2019  . Infected finger laceration 01/31/2019  . COPD (chronic obstructive pulmonary disease) with chronic bronchitis ([place]) 09/05/2018  . Orthostasis  05/23/2018  . Protein calorie malnutrition ([place]) 05/23/2018  . Acute ischemic enteritis ([place]) 11/26/2017  . Coronary artery disease involving native coronary artery of native heart without angina pectoris 02/21/2016  . Chronic chest pain 06/27/2015  . Cerebrovascular disease 04/29/2015  . Radicular pain of right lower back 12/05/2013  . S/P cardiac catheterization 04/19/2013  . Smoking 09/09/2011  . Mixed hyperlipidemia 04/09/2010  . GERD 09/21/2008  . ERECTILE DYSFUNCTION 09/20/2008    Past Surgical History:  Procedure Laterality Date  . BACK SURGERY    . CARDIAC CATHETERIZATION  04/06/13   [facility]- minor luminal irregularities, otherwise normal cors; EF > 55%. Medical management and smoking cessation recommended  . KNEE ARTHROSCOPY W/ PARTIAL MEDIAL MENISCECTOMY Right 2012   [place], 2012  . LEFT HEART CATH AND CORONARY ANGIOGRAPHY N/A 03/20/2019   Procedure: LEFT HEART CATH AND CORONARY ANGIOGRAPHY;  Surgeon: Troy Sine, MD;  Location: [facility];  Service: Cardiovascular;  Laterality: N/A;  . NASAL SINUS SURGERY    . neck fusion    . VASECTOMY         Family History  Problem Relation Age of Onset  . Arthritis Father   . Coronary artery disease Father   . Hypertension Father   . CVA Father 60  . Colon cancer Mother   . Heart attack Brother   . Heart attack Brother   . Coronary artery disease Brother 52  .  Esophageal cancer Neg Hx   . Stomach cancer Neg Hx   . Rectal cancer Neg Hx     Social History   Tobacco Use  . Smoking status: Current Every Day Smoker    Packs/day: 1.50    Years: 30.00    Pack years: 45.00    Types: Cigarettes  . Smokeless tobacco: Never Used  . Tobacco comment: he is aware he needs to quit   Substance Use Topics  . Alcohol use: No    Alcohol/week: 0.0 standard drinks  . Drug use: No    Home Medications Prior to Admission medications   Medication Sig Start Date End Date Taking? Authorizing Provider  albuterol (VENTOLIN HFA)  108 (90 Base) MCG/ACT inhaler Inhale 2 puffs into the lungs every 6 (six) hours as needed for wheezing or shortness of breath.    [provider]  amLODipine (NORVASC) 2.5 MG tablet Take 1 tablet (2.5 mg total) by mouth daily. 07/17/19 10/15/19  Minna Merritts, MD  aspirin EC 81 MG tablet Take 81 mg by mouth daily.    [provider]  atorvastatin (LIPITOR) 80 MG tablet Take 1 tablet (80 mg total) by mouth daily at 6 PM. 03/21/19   Nita Sells, MD  budesonide-formoterol (SYMBICORT) 160-4.5 MCG/ACT inhaler Inhale 2 puffs into the lungs 2 (two) times daily. 09/05/18   [name], [name], MD  escitalopram (LEXAPRO) 10 MG tablet TAKE 1 TABLET BY MOUTH  DAILY 09/06/19   [name], [name], MD  fludrocortisone (FLORINEF) 0.1 MG tablet TAKE 1 TABLET(0.1 MG) BY MOUTH DAILY 03/31/19   Theora Gianotti, NP  isosorbide mononitrate (IMDUR) 60 MG 24 hr tablet Take 0.5 tablets (30 mg total) by mouth at bedtime. 05/03/19   Theora Gianotti, NP  Multiple Vitamin (MULTIVITAMIN WITH MINERALS) TABS tablet Take 1 tablet by mouth daily.    [provider]  nicotine (NICODERM CQ - DOSED IN MG/24 HOURS) 21 mg/24hr patch Place 1 patch (21 mg total) onto the skin daily. 03/22/19   Nita Sells, MD  nicotine polacrilex (NICORETTE) 2 MG gum Take 1 each (2 mg total) by mouth every 4 (four) hours while awake. 03/21/19   Nita Sells, MD  nitroGLYCERIN (NITROSTAT) 0.4 MG SL tablet DISSOLVE 1 TABLET UNDER THE TONGUE EVERY 5 MINUTES AS NEEDED FOR CHEST PAIN 06/06/19   Minna Merritts, MD  Omega-3 Fatty Acids (FISH OIL) 1000 MG CAPS Take 1,000 mg by mouth daily.     [provider]    Allergies    Other  Review of Systems   Review of Systems  Constitutional: Positive for diaphoresis.  Respiratory: Positive for shortness of breath.   Cardiovascular: Positive for chest pain. Negative for syncope.  Gastrointestinal: Positive for vomiting. Negative for abdominal  pain.  Neurological: Negative for syncope.  All other systems reviewed and are negative.   Physical Exam Updated Vital Signs BP 103/62 (BP Location: Right Arm)   Pulse 81   Temp 98.3 F (36.8 C) (Oral)   Resp (!) 22   Ht 1.651 m (5\' 5" )   Wt 60.4 kg   SpO2 95%   BMI 22.16 kg/m   Physical Exam CONSTITUTIONAL: Well developed/well nourished HEAD: Normocephalic/atraumatic EYES: EOMI/PERRL ENMT: Mucous membranes moist NECK: supple no meningeal signs SPINE/BACK:entire spine nontender CV: S1/S2 noted, no murmurs/rubs/gallops noted LUNGS: Lungs are clear to auscultation bilaterally, no apparent distress ABDOMEN: soft, nontender, no rebound or guarding, bowel sounds noted throughout abdomen GU:no cva tenderness NEURO: Pt is awake/alert/appropriate, moves all  extremitiesx4.  No facial droop.   EXTREMITIES: pulses normal/equalx4, full ROM SKIN: warm, color normal PSYCH: no abnormalities of mood noted, alert and oriented to situation  ED Results / Procedures / Treatments   Labs (all labs ordered are listed, but only abnormal results are displayed) Labs Reviewed  BASIC METABOLIC PANEL - Abnormal; Notable for the following components:      Result Value   Potassium 5.3 (*)    Glucose, Bld 110 (*)    Calcium 8.4 (*)    All other components within normal limits  CBC - Abnormal; Notable for the following components:   WBC 11.4 (*)    RBC 4.14 (*)    RDW 16.4 (*)    All other components within normal limits  SARS CORONAVIRUS 2 (TAT 6-24 HRS)  COMPREHENSIVE METABOLIC PANEL  CBC  LIPID PANEL  TROPONIN I (HIGH SENSITIVITY)  TROPONIN I (HIGH SENSITIVITY)    EKG EKG Interpretation  Date/Time:  Saturday September 16 2019 23:39:39 EST Ventricular Rate:  73 PR Interval:    QRS Duration: 76 QT Interval:  393 QTC Calculation: 433 R Axis:   80 Text Interpretation: Sinus rhythm Anteroseptal infarct, age indeterminate No significant change since last tracing Confirmed by Ripley Fraise [phone]) on 09/16/2019 11:49:55 PM   Radiology DG Chest 2 View  Result Date: 09/17/2019 CLINICAL DATA:  Chest pain for 1 week. EXAM: CHEST - 2 VIEW COMPARISON:  Radiograph 03/19/2019 FINDINGS: The cardiomediastinal contours are normal. Hyperinflation is unchanged. Pulmonary vasculature is normal. No consolidation, pleural effusion, or pneumothorax. No acute osseous abnormalities are seen. Degenerative change in the spine. IMPRESSION: Stable hyperinflation without acute abnormality. Electronically Signed   By: Keith Rake M.D.   On: 09/17/2019 00:02    Procedures Procedures  Medications Ordered in ED Medications  enoxaparin (LOVENOX) injection 40 mg (has no administration in time range)  acetaminophen (TYLENOL) tablet 650 mg (has no administration in time range)    Or  acetaminophen (TYLENOL) suppository 650 mg (has no administration in time range)  0.9 %  sodium chloride infusion ( Intravenous New Bag/Given (Non-Interop) 09/17/19 0236)  sodium chloride flush (NS) 0.9 % injection 3 mL (3 mLs Intravenous Given 09/17/19 0119)  sodium chloride 0.9 % bolus 1,000 mL (0 mLs Intravenous Stopped 09/17/19 0225)    ED Course  I have reviewed the triage vital signs and the nursing notes.  Pertinent labs & imaging results that were available during my care of the patient were reviewed by me and considered in my medical decision making (see chart for details).    MDM Rules/Calculators/A&P                      Patient with history of CAD presents with chest pain.  He is already improving by time of evaluation.  He is already taken an aspirin He is resting comfortably. Will admit for further observation and monitoring for chest pain.  Discussed with Dr. Maudie Mercury for admission Final Clinical Impression(s) / ED Diagnoses Final diagnoses:  Chest pain, rule out acute myocardial infarction    Rx / DC Orders ED Discharge Orders    None       Ripley Fraise, MD 09/17/19 [phone]

## 2019-09-17 NOTE — Progress Notes (Signed)
  Echocardiogram 2D Echocardiogram has been performed.  Isaiah Rangel 09/17/2019, 11:31 AM

## 2019-09-17 NOTE — H&P (Addendum)
TRH H&P    Patient Demographics:    Isaiah Rangel, is a 59 y.o. male  MRN: [id number]  DOB - 1961/01/09  Admit Date - 09/16/2019  Referring MD/NP/PA:  Ripley Fraise  Outpatient Primary MD for the patient is [name], [name], MD  Patient coming from:  home  Chief complaint-  Chest pain   HPI:    Isaiah Rangel  is a 59 y.o. male,  w anxiety, smoker, Copd not on home o2,  CAD, s/p catheterization 03/20/19-> prox RCA 30%, mid RCA 70% , 1st mrg 40%, presents with c/o left sided chest pain for about 1 week.  It started when he was playing with his pitbulll,  Constant,  Pt thought that he had pulled a muscle.  Pt states that the pain yesterday move to the center of his chest and was associated with upset stomach and this prompted him to present to the ED for evaluation.  Pt notes slightly worse than baseline dyspnea.  Pt denies fever, chills, palp, abd pain, diarrhea, brbpr, black stool, dysuria, hematuria.  Pt took 4 aspirin at home.  Pt didn't try any nitro slg.  Pt states he is currently free of central chest pain, but still has nagging left sided chest pain .  Apparently pt forgot to take Imdur for the past 2 nites   In ED,  T 98.3, P 81, R 22, Bp 103/62  Pox 98% on RA Wt 60.4kg  CXR IMPRESSION: Stable hyperinflation without acute abnormality.  Na 141, K 5.3, Bun14 , Creatinine 0.81 Wbc 11.4, Hgb 13.1, Plt 217  Trop <2 -> <2  Ekg nsr at 70, nl axis, nl int, no st-t changes c/w ischemia (no sig change from 06/06/19 ekg)  Pt will be admitted for w/up of chest pain  Likely related to vasospasm    Review of systems:    In addition to the HPI above,  No Fever-chills, No Headache, No changes with Vision or hearing, No problems swallowing food or Liquids, No  Cough   No Abdominal pain, No Nausea or Vomiting, bowel movements are regular, No Blood in stool or Urine, No dysuria, No new skin rashes or  bruises, No new joints pains-aches,  No new weakness, tingling, numbness in any extremity, No recent weight gain or loss, No polyuria, polydypsia or polyphagia, No significant Mental Stressors.  All other systems reviewed and are negative.    Past History of the following :    Past Medical History:  Diagnosis Date  . Allergy   . Anxiety   . BPH (benign prostatic hyperplasia)   . COPD (chronic obstructive pulmonary disease) ([place])   . Coronary vasospasm ([place])    a. 03/2019 Cath: RCA 37m but only 40% after intracoronary ntg-->Imdur started.  Marland Kitchen History of echocardiogram    a. 03/2019 Echo: EF 50-55%, nl RV size/fxn. Trace MR/TR.  . Non-obstructive CAD (coronary artery disease)    a. 03/2013 Cath: min irregs. EF >55%; b. 03/2019 Cath: LM nl, LAD nl, LCX nl, OM1 40, RCA 30p, 60m-->40% after IC NTG.  Marland Kitchen  OA (osteoarthritis)   . Tobacco abuse       Past Surgical History:  Procedure Laterality Date  . BACK SURGERY    . CARDIAC CATHETERIZATION  04/06/13   [facility]- minor luminal irregularities, otherwise normal cors; EF > 55%. Medical management and smoking cessation recommended  . KNEE ARTHROSCOPY W/ PARTIAL MEDIAL MENISCECTOMY Right 2012   [place], 2012  . LEFT HEART CATH AND CORONARY ANGIOGRAPHY N/A 03/20/2019   Procedure: LEFT HEART CATH AND CORONARY ANGIOGRAPHY;  Surgeon: Troy Sine, MD;  Location: [facility];  Service: Cardiovascular;  Laterality: N/A;  . NASAL SINUS SURGERY    . neck fusion    . VASECTOMY        Social History:      Social History   Tobacco Use  . Smoking status: Current Every Day Smoker    Packs/day: 1.50    Years: 30.00    Pack years: 45.00    Types: Cigarettes  . Smokeless tobacco: Never Used  . Tobacco comment: he is aware he needs to quit   Substance Use Topics  . Alcohol use: No    Alcohol/week: 0.0 standard drinks       Family History :     Family History  Problem Relation Age of Onset  . Arthritis Father   . Coronary artery  disease Father   . Hypertension Father   . CVA Father 87  . Colon cancer Mother   . Heart attack Brother   . Heart attack Brother   . Coronary artery disease Brother 80  . Esophageal cancer Neg Hx   . Stomach cancer Neg Hx   . Rectal cancer Neg Hx        Home Medications:   Prior to Admission medications   Medication Sig Start Date End Date Taking? Authorizing Provider  albuterol (VENTOLIN HFA) 108 (90 Base) MCG/ACT inhaler Inhale 2 puffs into the lungs every 6 (six) hours as needed for wheezing or shortness of breath.    [provider]  amLODipine (NORVASC) 2.5 MG tablet Take 1 tablet (2.5 mg total) by mouth daily. 07/17/19 10/15/19  Minna Merritts, MD  aspirin EC 81 MG tablet Take 81 mg by mouth daily.    [provider]  atorvastatin (LIPITOR) 80 MG tablet Take 1 tablet (80 mg total) by mouth daily at 6 PM. 03/21/19   Nita Sells, MD  budesonide-formoterol (SYMBICORT) 160-4.5 MCG/ACT inhaler Inhale 2 puffs into the lungs 2 (two) times daily. 09/05/18   [name], [name], MD  escitalopram (LEXAPRO) 10 MG tablet TAKE 1 TABLET BY MOUTH  DAILY 09/06/19   [name], [name], MD  fludrocortisone (FLORINEF) 0.1 MG tablet TAKE 1 TABLET(0.1 MG) BY MOUTH DAILY 03/31/19   Theora Gianotti, NP  isosorbide mononitrate (IMDUR) 60 MG 24 hr tablet Take 0.5 tablets (30 mg total) by mouth at bedtime. 05/03/19   Theora Gianotti, NP  Multiple Vitamin (MULTIVITAMIN WITH MINERALS) TABS tablet Take 1 tablet by mouth daily.    [provider]  nicotine (NICODERM CQ - DOSED IN MG/24 HOURS) 21 mg/24hr patch Place 1 patch (21 mg total) onto the skin daily. 03/22/19   Nita Sells, MD  nicotine polacrilex (NICORETTE) 2 MG gum Take 1 each (2 mg total) by mouth every 4 (four) hours while awake. 03/21/19   Nita Sells, MD  nitroGLYCERIN (NITROSTAT) 0.4 MG SL tablet DISSOLVE 1 TABLET UNDER THE TONGUE EVERY 5 MINUTES AS NEEDED FOR CHEST PAIN 06/06/19    Rockey Situ,  Kathlene November, MD  Omega-3 Fatty Acids (FISH OIL) 1000 MG CAPS Take 1,000 mg by mouth daily.     [provider]     Allergies:     Allergies  Allergen Reactions  . Other Nausea And Vomiting and Other (See Comments)    Quail eggs caused severe stomach pain and n/v     Physical Exam:   Vitals  Blood pressure 94/71, pulse 66, temperature 98.1 F (36.7 C), temperature source Oral, resp. rate 17, height 5\' 5"  (1.651 m), weight 60.4 kg, SpO2 95 %.  1.  General: axoxo3  2. Psychiatric: euthymic  3. Neurologic: nonfocal  4. HEENMT:  Anciteric, pupils 1.21mm symmetric, direct , consensual intact Neck: no jvd  5. Respiratory : CTAB, prolonged exp phase  6. Cardiovascular : rrr s1, s2, no m/g/r  7. Gastrointestinal:  Abd: soft, nt, nd, +bs  8. Skin:  Ext: no c/c/e, no rash  9.Musculoskeletal:  Good ROM    Data Review:    CBC Recent Labs  Lab 09/16/19 2341  WBC 11.4*  HGB 13.1  HCT 39.3  PLT 217  MCV 94.9  MCH 31.6  MCHC 33.3  RDW 16.4*   ------------------------------------------------------------------------------------------------------------------  Results for orders placed or performed during the hospital encounter of 09/16/19 (from the past 48 hour(s))  Basic metabolic panel     Status: Abnormal   Collection Time: 09/16/19 11:41 PM  Result Value Ref Range   Sodium 141 135 - 145 mmol/L   Potassium 5.3 (H) 3.5 - 5.1 mmol/L    Comment: SPECIMEN HEMOLYZED. HEMOLYSIS MAY AFFECT INTEGRITY OF RESULTS.   Chloride 108 98 - 111 mmol/L   CO2 25 22 - 32 mmol/L   Glucose, Bld 110 (H) 70 - 99 mg/dL   BUN 14 6 - 20 mg/dL   Creatinine, Ser 0.81 0.61 - 1.24 mg/dL   Calcium 8.4 (L) 8.9 - 10.3 mg/dL   GFR calc non Af Amer >60 >60 mL/min   GFR calc Af Amer >60 >60 mL/min   Anion gap 8 5 - 15    Comment: Performed at [facility] [address] [place], [place] [address]  CBC     Status: Abnormal   Collection Time: 09/16/19 11:41 PM    Result Value Ref Range   WBC 11.4 (H) 4.0 - 10.5 K/uL   RBC 4.14 (L) 4.22 - 5.81 MIL/uL   Hemoglobin 13.1 13.0 - 17.0 g/dL   HCT 39.3 39.0 - 52.0 %   MCV 94.9 80.0 - 100.0 fL   MCH 31.6 26.0 - 34.0 pg   MCHC 33.3 30.0 - 36.0 g/dL   RDW 16.4 (H) 11.5 - 15.5 %   Platelets 217 150 - 400 K/uL   nRBC 0.2 0.0 - 0.2 %    Comment: Performed at [facility], [place] [address], [place], [place] [address]  Troponin I (High Sensitivity)     Status: None   Collection Time: 09/16/19 11:41 PM  Result Value Ref Range   Troponin I (High Sensitivity) <2 <18 ng/L    Comment: Performed at [facility] [address] [place], [place] [address]  Troponin I (High Sensitivity)     Status: None   Collection Time: 09/17/19  1:19 AM  Result Value Ref Range   Troponin I (High Sensitivity) <2 <18 ng/L    Comment: Performed at [facility] [address] [place], [place] [address]    Chemistries  Recent Labs  Lab 09/16/19 2341  NA 141  K 5.3*  CL 108  CO2 25  GLUCOSE 110*  BUN 14  CREATININE 0.81  CALCIUM 8.4*   ------------------------------------------------------------------------------------------------------------------  ------------------------------------------------------------------------------------------------------------------ GFR: Estimated Creatinine Clearance: 83.9 mL/min (by C-G formula based on SCr of 0.81 mg/dL). Liver Function Tests: No results for input(s): AST, ALT, ALKPHOS, BILITOT, PROT, ALBUMIN in the last 168 hours. No results for input(s): LIPASE, AMYLASE in the last 168 hours. No results for input(s): AMMONIA in the last 168 hours. Coagulation Profile: No results for input(s): INR, PROTIME in the last 168 hours. Cardiac Enzymes: No results for input(s): CKTOTAL, CKMB, CKMBINDEX, TROPONINI in the last 168 hours. BNP (last 3 results) No results for input(s): PROBNP in the last 8760 hours. HbA1C: No results for input(s): HGBA1C in the last 72  hours. CBG: No results for input(s): GLUCAP in the last 168 hours. Lipid Profile: No results for input(s): CHOL, HDL, LDLCALC, TRIG, CHOLHDL, LDLDIRECT in the last 72 hours. Thyroid Function Tests: No results for input(s): TSH, T4TOTAL, FREET4, T3FREE, THYROIDAB in the last 72 hours. Anemia Panel: No results for input(s): VITAMINB12, FOLATE, FERRITIN, TIBC, IRON, RETICCTPCT in the last 72 hours.  --------------------------------------------------------------------------------------------------------------- Urine analysis:    Component Value Date/Time   COLORURINE YELLOW (A) 11/30/2017 0923   APPEARANCEUR CLEAR (A) 11/30/2017 0923   LABSPEC 1.005 11/30/2017 0923   PHURINE 6.0 11/30/2017 0923   GLUCOSEU 50 (A) 11/30/2017 0923   HGBUR NEGATIVE 11/30/2017 0923   HGBUR moderate 07/09/2010 1144   BILIRUBINUR NEGATIVE 11/30/2017 0923   KETONESUR NEGATIVE 11/30/2017 0923   PROTEINUR NEGATIVE 11/30/2017 0923   UROBILINOGEN 0.2 07/09/2010 1144   NITRITE NEGATIVE 11/30/2017 0923   LEUKOCYTESUR NEGATIVE 11/30/2017 0923      Imaging Results:    DG Chest 2 View  Result Date: 09/17/2019 CLINICAL DATA:  Chest pain for 1 week. EXAM: CHEST - 2 VIEW COMPARISON:  Radiograph 03/19/2019 FINDINGS: The cardiomediastinal contours are normal. Hyperinflation is unchanged. Pulmonary vasculature is normal. No consolidation, pleural effusion, or pneumothorax. No acute osseous abnormalities are seen. Degenerative change in the spine. IMPRESSION: Stable hyperinflation without acute abnormality. Electronically Signed   By: Keith Rake M.D.   On: 09/17/2019 00:02      Assessment & Plan:    Principal Problem:   Chest pain Active Problems:   Mixed hyperlipidemia   Coronary artery disease involving native coronary artery of native heart without angina pectoris  Chest pain secondary to ? Vasospasm due to noncompliance w imdur Nonocclusive CAD Cath => 03/20/19-> prox RCA 30%, mid RCA 70% , 1st mrg 40%,   Tele Trop I Check lipid Encourage patient to stop smoking Cont aspirin Wife notes that pt noncompliant with imdur the past 2 nites Will restart Imdur 30mg  po qday Cont Amlodipine 2.5mg  po  qday Cont Lipitor 80mg  po qhs Check cardiac echo Please consult cardiology this am  Copd Cont Ventolin HFA 2puff q6h prn  Cont Symbicort 160/4.5 -> Dulera 2puff bid  Anxiety Cont Lexapro 10mg  po qday  Smoker Pt encourage to smoke as above x 3 minutes  ? Low bp ? Vs orthostasis Cont Florinef   DVT Prophylaxis-   Lovenox - SCDs    AM Labs Ordered, also please review Full Orders  Family Communication: Admission, patients condition and plan of care including tests being ordered have been discussed with the patient who indicate understanding and agree with the plan and Code Status.  Code Status:  FULL CODE per patient, notified spouse that patient admitted  to [facility]  Admission status: Observationt: Based on patients clinical presentation and evaluation of above clinical data, I have made determination that patient meets Observation criteria at this time.  Time spent in minutes : 55 minutes   Jani Gravel M.D on 09/17/2019 at 4:36 AM

## 2019-09-17 NOTE — ED Notes (Signed)
Patient on a transport portable monitor

## 2019-10-05 ENCOUNTER — Other Ambulatory Visit: Payer: 59

## 2019-10-09 ENCOUNTER — Encounter: Payer: Self-pay | Admitting: Family Medicine

## 2019-10-09 ENCOUNTER — Ambulatory Visit (INDEPENDENT_AMBULATORY_CARE_PROVIDER_SITE_OTHER): Payer: 59 | Admitting: Family Medicine

## 2019-10-09 ENCOUNTER — Other Ambulatory Visit: Payer: Self-pay

## 2019-10-09 VITALS — BP 118/70 | HR 88 | Temp 98.2°F | Ht 65.0 in | Wt 138.8 lb

## 2019-10-09 DIAGNOSIS — Z0000 Encounter for general adult medical examination without abnormal findings: Secondary | ICD-10-CM | POA: Diagnosis not present

## 2019-10-09 DIAGNOSIS — J449 Chronic obstructive pulmonary disease, unspecified: Secondary | ICD-10-CM

## 2019-10-09 DIAGNOSIS — Z23 Encounter for immunization: Secondary | ICD-10-CM | POA: Diagnosis not present

## 2019-10-09 NOTE — Progress Notes (Signed)
Anastasios Melander T. Faruq Rosenberger, MD [facility] [place], [address] Phone: [phone]  FAX: [place] - 59 y.o. male  MRN [id number]  Date of Birth: 01/15/1961  Visit Date: 10/09/2019  PCP: Owens Loffler, MD  Referred by: Owens Loffler, MD  Chief Complaint  Patient presents with  . Annual Exam    This visit occurred during the SARS-CoV-2 public health emergency.  Safety protocols were in place, including screening questions prior to the visit, additional usage of staff PPE, and extensive cleaning of exam room while observing appropriate contact time as indicated for disinfecting solutions.   Patient Care Team: Owens Loffler, MD as PCP - General Rockey Situ, Kathlene November, MD as PCP - Cardiology (Cardiology) Minna Merritts, MD as Consulting Physician (Cardiology) Subjective:   AVERETT MAGGIO is a 59 y.o. pleasant patient who presents with the following:  Preventative Health Maintenance Visit:  Health Maintenance Summary Reviewed and updated, unless pt declines services.  Tobacco History Reviewed. 1/2 pack a day. Doing some chewing gum and patch.  Alcohol: No concerns, no excessive use. Very rarely Exercise Habits: Some activity, rec at least 30 mins 5 times a week - very active with work STD concerns: no risk or activity to increase risk Drug Use: None  2 weeks ago went to the hospital .  Was sitting on the back porch and got nauseated.  Started to get sweating.  No chest pain and felt like when he had prior CP.  Missed some imdur doses.  He is very significantly cut back on his cigarette use.  He is trying to taper himself off.  He does use him Symbicort, but generally only about 2 times weekly.  Varying based on the weather, he will use it more or less.  Admit date: 09/16/2019 Discharge date: 09/17/2019  Admitted From: Home Discharge disposition:  Home  Recommendations for Outpatient Follow-Up:   1. Smoking cessation 2. Encourage patient to take medications daily 3. Wife states patient has a lot of anxiety, questional change versus titration of depression but defer to PCP   Pneumonvax-23 Covid-19 vaccine - will get.    Health Maintenance  Topic Date Due  . COLONOSCOPY  12/13/2023  . TETANUS/TDAP  06/22/2026  . INFLUENZA VACCINE  Completed  . Hepatitis C Screening  Completed  . HIV Screening  Completed   Immunization History  Administered Date(s) Administered  . Influenza Inj Mdck Quad Pf 10/09/2019  . Influenza Whole 07/09/2010  . Influenza, Seasonal, Injecte, Preservative Fre 06/18/2015  . Influenza,inj,Quad PF,6+ Mos 06/22/2016  . Pneumococcal Polysaccharide-23 10/09/2019  . Tdap 06/22/2016   Patient Active Problem List   Diagnosis Date Noted  . NSTEMI (non-ST elevated myocardial infarction) ([place]) 03/20/2019    Priority: High  . Coronary artery disease involving native coronary artery of native heart without angina pectoris 02/21/2016    Priority: High  . Cerebrovascular disease 04/29/2015    Priority: High  . COPD (chronic obstructive pulmonary disease) with chronic bronchitis ([place]) 09/05/2018    Priority: Medium  . S/P cardiac catheterization 04/19/2013    Priority: Medium  . Smoking 09/09/2011    Priority: Medium  . Acute ischemic enteritis ([place]) 11/26/2017  . Radicular pain of right lower back 12/05/2013  . Mixed hyperlipidemia 04/09/2010  . GERD 09/21/2008  . ERECTILE DYSFUNCTION 09/20/2008    Past Medical History:  Diagnosis Date  . Allergy   . Anxiety   .  BPH (benign prostatic hyperplasia)   . COPD (chronic obstructive pulmonary disease) ([place])   . Coronary vasospasm ([place])    a. 03/2019 Cath: RCA 40m but only 40% after intracoronary ntg-->Imdur started.  Marland Kitchen History of echocardiogram    a. 03/2019 Echo: EF 50-55%, nl RV size/fxn. Trace MR/TR.  . Non-obstructive CAD (coronary artery disease)     a. 03/2013 Cath: min irregs. EF >55%; b. 03/2019 Cath: LM nl, LAD nl, LCX nl, OM1 40, RCA 30p, 56m-->40% after IC NTG.  . OA (osteoarthritis)   . Tobacco abuse     Past Surgical History:  Procedure Laterality Date  . BACK SURGERY    . CARDIAC CATHETERIZATION  04/06/13   [facility]- minor luminal irregularities, otherwise normal cors; EF > 55%. Medical management and smoking cessation recommended  . KNEE ARTHROSCOPY W/ PARTIAL MEDIAL MENISCECTOMY Right 2012   [place], 2012  . LEFT HEART CATH AND CORONARY ANGIOGRAPHY N/A 03/20/2019   Procedure: LEFT HEART CATH AND CORONARY ANGIOGRAPHY;  Surgeon: Troy Sine, MD;  Location: [place];  Service: Cardiovascular;  Laterality: N/A;  . NASAL SINUS SURGERY    . neck fusion    . VASECTOMY      Family History  Problem Relation Age of Onset  . Arthritis Father   . Coronary artery disease Father   . Hypertension Father   . CVA Father 60  . Colon cancer Mother   . Heart attack Brother   . Heart attack Brother   . Coronary artery disease Brother 35  . Esophageal cancer Neg Hx   . Stomach cancer Neg Hx   . Rectal cancer Neg Hx     Past Medical History, Surgical History, Social History, Family History, Problem List, Medications, and Allergies have been reviewed and updated if relevant.  Review of Systems: Pertinent positives are listed above.  Otherwise, a full 14 point review of systems has been done in full and it is negative except where it is noted positive.  Objective:   BP 118/70   Pulse 88   Temp 98.2 F (36.8 C) (Temporal)   Ht 5\' 5"  (1.651 m)   Wt 138 lb 12 oz (62.9 kg)   SpO2 96%   BMI 23.09 kg/m  Ideal Body Weight: Weight in (lb) to have BMI = 25: 149.9  Ideal Body Weight: Weight in (lb) to have BMI = 25: 149.9 No exam data present Depression screen [facility] 2/9 10/09/2019 09/05/2018 04/07/2017  Decreased Interest 0 0 2  Down, Depressed, Hopeless 0 0 1  PHQ - 2 Score 0 0 3  Altered sleeping - - 1  Tired, decreased energy  - - 1  Change in appetite - - 1  Feeling bad or failure about yourself  - - 0  Trouble concentrating - - 1  Moving slowly or fidgety/restless - - 0  Suicidal thoughts - - 0  PHQ-9 Score - - 7  Difficult doing work/chores - - Not difficult at all     GEN: well developed, well nourished, no acute distress Eyes: conjunctiva and lids normal, PERRLA, EOMI ENT: TM clear, nares clear, oral exam WNL Neck: supple, no lymphadenopathy, no thyromegaly, no JVD Pulm: clear to auscultation and percussion, respiratory effort normal CV: regular rate and rhythm, S1-S2, no murmur, rub or gallop, no bruits, peripheral pulses normal and symmetric, no cyanosis, clubbing, edema or varicosities GI: soft, non-tender; no hepatosplenomegaly, masses; active bowel sounds all quadrants GU: no hernia, testicular mass, penile discharge Lymph: no cervical,  axillary or inguinal adenopathy MSK: gait normal, muscle tone and strength WNL, no joint swelling, effusions, discoloration, crepitus  SKIN: clear, good turgor, color WNL, no rashes, lesions, or ulcerations Neuro: normal mental status, normal strength, sensation, and motion Psych: alert; oriented to person, place and time, normally interactive and not anxious or depressed in appearance.  All labs reviewed with patient. Results for orders placed or performed during the hospital encounter of 09/16/19  SARS CORONAVIRUS 2 (TAT 6-24 HRS) Nasopharyngeal Nasopharyngeal Swab   Specimen: Nasopharyngeal Swab  Result Value Ref Range   SARS Coronavirus 2 NEGATIVE NEGATIVE  Basic metabolic panel  Result Value Ref Range   Sodium 141 135 - 145 mmol/L   Potassium 5.3 (H) 3.5 - 5.1 mmol/L   Chloride 108 98 - 111 mmol/L   CO2 25 22 - 32 mmol/L   Glucose, Bld 110 (H) 70 - 99 mg/dL   BUN 14 6 - 20 mg/dL   Creatinine, Ser 0.81 0.61 - 1.24 mg/dL   Calcium 8.4 (L) 8.9 - 10.3 mg/dL   GFR calc non Af Amer >60 >60 mL/min   GFR calc Af Amer >60 >60 mL/min   Anion gap 8 5 - 15   CBC  Result Value Ref Range   WBC 11.4 (H) 4.0 - 10.5 K/uL   RBC 4.14 (L) 4.22 - 5.81 MIL/uL   Hemoglobin 13.1 13.0 - 17.0 g/dL   HCT 39.3 39.0 - 52.0 %   MCV 94.9 80.0 - 100.0 fL   MCH 31.6 26.0 - 34.0 pg   MCHC 33.3 30.0 - 36.0 g/dL   RDW 16.4 (H) 11.5 - 15.5 %   Platelets 217 150 - 400 K/uL   nRBC 0.2 0.0 - 0.2 %  Comprehensive metabolic panel  Result Value Ref Range   Sodium 143 135 - 145 mmol/L   Potassium 4.0 3.5 - 5.1 mmol/L   Chloride 110 98 - 111 mmol/L   CO2 25 22 - 32 mmol/L   Glucose, Bld 107 (H) 70 - 99 mg/dL   BUN 12 6 - 20 mg/dL   Creatinine, Ser 0.76 0.61 - 1.24 mg/dL   Calcium 8.4 (L) 8.9 - 10.3 mg/dL   Total Protein 5.3 (L) 6.5 - 8.1 g/dL   Albumin 3.2 (L) 3.5 - 5.0 g/dL   AST 15 15 - 41 U/L   ALT 15 0 - 44 U/L   Alkaline Phosphatase 68 38 - 126 U/L   Total Bilirubin 0.6 0.3 - 1.2 mg/dL   GFR calc non Af Amer >60 >60 mL/min   GFR calc Af Amer >60 >60 mL/min   Anion gap 8 5 - 15  CBC  Result Value Ref Range   WBC 12.5 (H) 4.0 - 10.5 K/uL   RBC 3.78 (L) 4.22 - 5.81 MIL/uL   Hemoglobin 11.9 (L) 13.0 - 17.0 g/dL   HCT 35.7 (L) 39.0 - 52.0 %   MCV 94.4 80.0 - 100.0 fL   MCH 31.5 26.0 - 34.0 pg   MCHC 33.3 30.0 - 36.0 g/dL   RDW 16.3 (H) 11.5 - 15.5 %   Platelets 198 150 - 400 K/uL   nRBC 0.0 0.0 - 0.2 %  Lipid panel  Result Value Ref Range   Cholesterol 143 0 - 200 mg/dL   Triglycerides 52 <150 mg/dL   HDL 33 (L) >40 mg/dL   Total CHOL/HDL Ratio 4.3 RATIO   VLDL 10 0 - 40 mg/dL   LDL Cholesterol 100 (H) 0 - 99 mg/dL  ECHOCARDIOGRAM COMPLETE  Result Value Ref Range   Weight 2,204.6 oz   Height 65 in   BP 107/51 mmHg  Troponin I (High Sensitivity)  Result Value Ref Range   Troponin I (High Sensitivity) <2 <18 ng/L  Troponin I (High Sensitivity)  Result Value Ref Range   Troponin I (High Sensitivity) <2 <18 ng/L  Troponin I (High Sensitivity)  Result Value Ref Range   Troponin I (High Sensitivity) <2 <18 ng/L    Assessment and Plan:      ICD-10-CM   1. Healthcare maintenance  Z00.00   2. Need for prophylactic vaccination against Streptococcus pneumoniae (pneumococcus)  Z23 Pneumococcal polysaccharide vaccine 23-valent greater than or equal to 2yo subcutaneous/IM  3. COPD (chronic obstructive pulmonary disease) with chronic bronchitis (HCC)  J44.9 Pneumococcal polysaccharide vaccine 23-valent greater than or equal to 2yo subcutaneous/IM  4. Need for influenza vaccination  Z23 Flu Vaccine MDCK QUAD PF   Globally doing fairly well.  Recommended better compliance with taking all of his medication.  Primary areas of concern and I think that I convinced him to get his COVID-19 vaccine.  Pneumovax 23 today.  Influenza vaccine today as well.  Health Maintenance Exam: The patient's preventative maintenance and recommended screening tests for an annual wellness exam were reviewed in full today. Brought up to date unless services declined.  Counselled on the importance of diet, exercise, and its role in overall health and mortality. The patient's FH and SH was reviewed, including their home life, tobacco status, and drug and alcohol status.  Follow-up in 1 year for physical exam or additional follow-up below.  Follow-up: No follow-ups on file. Or follow-up in 1 year if not noted.  No orders of the defined types were placed in this encounter.  There are no discontinued medications. Orders Placed This Encounter  Procedures  . Pneumococcal polysaccharide vaccine 23-valent greater than or equal to 2yo subcutaneous/IM  . Flu Vaccine MDCK QUAD PF    Signed,  Dainelle Hun T. Leeon Makar, MD   Allergies as of 10/09/2019      Reactions   Other Nausea And Vomiting, Other (See Comments)   Quail eggs caused severe stomach pain and n/v      Medication List       Accurate as of October 09, 2019 11:59 PM. If you have any questions, ask your nurse or doctor.        albuterol 108 (90 Base) MCG/ACT inhaler Commonly known as: VENTOLIN  HFA Inhale 2 puffs into the lungs every 6 (six) hours as needed for wheezing or shortness of breath.   amLODipine 2.5 MG tablet Commonly known as: NORVASC Take 1 tablet (2.5 mg total) by mouth daily.   aspirin EC 81 MG tablet Take 81 mg by mouth daily.   atorvastatin 80 MG tablet Commonly known as: LIPITOR Take 1 tablet (80 mg total) by mouth daily at 6 PM.   budesonide-formoterol 160-4.5 MCG/ACT inhaler Commonly known as: SYMBICORT Inhale 2 puffs into the lungs 2 (two) times daily.   escitalopram 10 MG tablet Commonly known as: LEXAPRO TAKE 1 TABLET BY MOUTH  DAILY   Fish Oil 1000 MG Caps Take 1,000 mg by mouth daily.   fludrocortisone 0.1 MG tablet Commonly known as: FLORINEF TAKE 1 TABLET(0.1 MG) BY MOUTH DAILY   isosorbide mononitrate 60 MG 24 hr tablet Commonly known as: IMDUR Take 0.5 tablets (30 mg total) by mouth at bedtime.   multivitamin with minerals Tabs tablet Take 1 tablet by mouth daily.  nicotine 21 mg/24hr patch Commonly known as: NICODERM CQ - dosed in mg/24 hours Place 1 patch (21 mg total) onto the skin daily.   nicotine polacrilex 2 MG gum Commonly known as: NICORETTE Take 1 each (2 mg total) by mouth every 4 (four) hours while awake.   nitroGLYCERIN 0.4 MG SL tablet Commonly known as: NITROSTAT DISSOLVE 1 TABLET UNDER THE TONGUE EVERY 5 MINUTES AS NEEDED FOR CHEST PAIN

## 2019-10-23 ENCOUNTER — Other Ambulatory Visit: Payer: Self-pay | Admitting: *Deleted

## 2019-10-23 MED ORDER — FLUDROCORTISONE ACETATE 0.1 MG PO TABS: ORAL_TABLET | ORAL | 2 refills | Status: DC

## 2019-10-23 NOTE — Telephone Encounter (Signed)
Requested Prescriptions   Pending Prescriptions Disp Refills  . fludrocortisone (FLORINEF) 0.1 MG tablet 90 tablet 2    Sig: TAKE 1 TABLET(0.1 MG) BY MOUTH DAILY

## 2019-11-25 ENCOUNTER — Other Ambulatory Visit: Payer: Self-pay | Admitting: Family Medicine

## 2020-01-04 ENCOUNTER — Ambulatory Visit (INDEPENDENT_AMBULATORY_CARE_PROVIDER_SITE_OTHER): Payer: 59 | Admitting: Family

## 2020-01-04 ENCOUNTER — Other Ambulatory Visit: Payer: Self-pay

## 2020-01-04 ENCOUNTER — Other Ambulatory Visit
Admission: RE | Admit: 2020-01-04 | Discharge: 2020-01-04 | Disposition: A | Discharge status: Home / Self Care | Payer: 59 | Source: Ambulatory Visit | Attending: Family | Admitting: Family

## 2020-01-04 ENCOUNTER — Encounter: Payer: Self-pay | Admitting: Family

## 2020-01-04 VITALS — BP 90/62 | HR 67 | Ht 65.0 in | Wt 141.0 lb

## 2020-01-04 DIAGNOSIS — J449 Chronic obstructive pulmonary disease, unspecified: Secondary | ICD-10-CM | POA: Diagnosis present

## 2020-01-04 DIAGNOSIS — R079 Chest pain, unspecified: Secondary | ICD-10-CM | POA: Diagnosis present

## 2020-01-04 DIAGNOSIS — I2510 Atherosclerotic heart disease of native coronary artery without angina pectoris: Secondary | ICD-10-CM

## 2020-01-04 DIAGNOSIS — Z720 Tobacco use: Secondary | ICD-10-CM

## 2020-01-04 DIAGNOSIS — I951 Orthostatic hypotension: Secondary | ICD-10-CM

## 2020-01-04 DIAGNOSIS — E785 Hyperlipidemia, unspecified: Secondary | ICD-10-CM | POA: Diagnosis present

## 2020-01-04 DIAGNOSIS — I201 Angina pectoris with documented spasm: Secondary | ICD-10-CM | POA: Insufficient documentation

## 2020-01-04 LAB — CBC
HCT: 40.1 % (ref 39.0–52.0)
Hemoglobin: 13.4 g/dL (ref 13.0–17.0)
MCH: 31.5 pg (ref 26.0–34.0)
MCHC: 33.4 g/dL (ref 30.0–36.0)
MCV: 94.1 fL (ref 80.0–100.0)
Platelets: 217 10*3/uL (ref 150–400)
RBC: 4.26 MIL/uL (ref 4.22–5.81)
RDW: 16.2 % — ABNORMAL HIGH (ref 11.5–15.5)
WBC: 10.6 10*3/uL — ABNORMAL HIGH (ref 4.0–10.5)
nRBC: 0.2 % (ref 0.0–0.2)

## 2020-01-04 LAB — COMPREHENSIVE METABOLIC PANEL
ALT: 28 U/L (ref 0–44)
AST: 23 U/L (ref 15–41)
Albumin: 4.3 g/dL (ref 3.5–5.0)
Alkaline Phosphatase: 81 U/L (ref 38–126)
Anion gap: 7 (ref 5–15)
BUN: 12 mg/dL (ref 6–20)
CO2: 26 mmol/L (ref 22–32)
Calcium: 9.1 mg/dL (ref 8.9–10.3)
Chloride: 109 mmol/L (ref 98–111)
Creatinine, Ser: 0.83 mg/dL (ref 0.61–1.24)
GFR calc Af Amer: >60 mL/min (ref >60)
GFR calc non Af Amer: >60 mL/min (ref >60)
Glucose, Bld: 91 mg/dL (ref 70–99)
Potassium: 3.9 mmol/L (ref 3.5–5.1)
Sodium: 142 mmol/L (ref 135–145)
Total Bilirubin: 0.5 mg/dL (ref 0.3–1.2)
Total Protein: 6.9 g/dL (ref 6.5–8.1)

## 2020-01-04 LAB — TSH: TSH: 3.355 u[IU]/mL (ref 0.350–4.500)

## 2020-01-04 MED ORDER — NICOTINE 21 MG/24HR TD PT24: 21.0000 mg | MEDICATED_PATCH | Freq: Every day | TRANSDERMAL | 0 refills | Status: DC

## 2020-01-04 MED ORDER — FLUDROCORTISONE ACETATE 0.1 MG PO TABS: 0.2000 mg | ORAL_TABLET | Freq: Every day | ORAL | 1 refills | Status: DC

## 2020-01-04 MED ORDER — NICOTINE POLACRILEX 2 MG MT GUM: 2.0000 mg | CHEWING_GUM | OROMUCOSAL | 0 refills | Status: DC

## 2020-01-04 NOTE — Progress Notes (Signed)
Office Visit    Patient Name: Isaiah Rangel Date of Encounter: 01/05/2020  Primary Care Provider:  Owens Loffler, MD Primary Cardiologist:  Ida Rogue, MD Electrophysiologist:  None   Chief Complaint    Isaiah Rangel is a 59 y.o. male with a hx of CAD, orthostatic hypotension, HLD, tobacco use, COPD presents today for chest pain   Past Medical History    Past Medical History:  Diagnosis Date  . Allergy   . Anxiety   . BPH (benign prostatic hyperplasia)   . COPD (chronic obstructive pulmonary disease) ([place])   . Coronary vasospasm ([place])    a. 03/2019 Cath: RCA 77m but only 40% after intracoronary ntg-->Imdur started.  Marland Kitchen History of echocardiogram    a. 03/2019 Echo: EF 50-55%, nl RV size/fxn. Trace MR/TR.  . Non-obstructive CAD (coronary artery disease)    a. 03/2013 Cath: min irregs. EF >55%; b. 03/2019 Cath: LM nl, LAD nl, LCX nl, OM1 40, RCA 30p, 18m-->40% after IC NTG.  . OA (osteoarthritis)   . Tobacco abuse    Past Surgical History:  Procedure Laterality Date  . BACK SURGERY    . CARDIAC CATHETERIZATION  04/06/13   [facility]- minor luminal irregularities, otherwise normal cors; EF > 55%. Medical management and smoking cessation recommended  . KNEE ARTHROSCOPY W/ PARTIAL MEDIAL MENISCECTOMY Right 2012   [place], 2012  . LEFT HEART CATH AND CORONARY ANGIOGRAPHY N/A 03/20/2019   Procedure: LEFT HEART CATH AND CORONARY ANGIOGRAPHY;  Surgeon: Troy Sine, MD;  Location: [facility];  Service: Cardiovascular;  Laterality: N/A;  . NASAL SINUS SURGERY    . neck fusion    . VASECTOMY      Allergies  Allergies  Allergen Reactions  . Other Nausea And Vomiting and Other (See Comments)    Quail eggs caused severe stomach pain and n/v    History of Present Illness    Isaiah Rangel is a 59 y.o. male with a hx of orthostatic hypotension, tobacco abuse, COPD, CAD s/p NSTEMI in setting of vasospasm 03/2019, Raynaud's, HLD, GERD, anxiety last seen 06/2019 by Dr.  Rockey Situ.  Initial cardiac cath in 2014 with minor irregularities. Continued intermittent chest discomfort was medically managed with long acting nitrate. July 2020 presented with chest pain that did not resolve with nitroglycerin, nausea, vomiting, dyspnea. Diagnostic cath initially with mid RCA stenosis however after intracoronary nitro the vessel dilated and only 40% stenosis was noted. His Imdur was uptitrated to 60mg .   Clinic visit 05/2019 noted no significant improvement on higher dose Imdur. His Imdur was reduced to 30mg  and Amlodipine 2.5mg  was added. At clinic visit 06/2019 he reported feeling overall well and no changes were made.   Admitted January 2021 for chest pain after forgetting his Imdur for 2 nights. His troponin was negative and his episode was presumed due to missing his Imdur.   He presents today for chest heaviness with also noting nausea and dyspnea.   He has had some chest pain since discharge in January. Reports gets abnormal feeling in his chest, i.e. when he coughs will get tingle in his left arm. Feeling a vibrating sensation in his mid to right chest, lasts about 5 seconds, and self resolves. His primary concern is a "heavy chest feeling". Happens at rest and with activity. Tells me its not like it was when he had to take the nitroglycerin. Not so much a pain but a heaviness. Has been worsening over the last 1-2 weeks.  Has been getting nausea which improves with food. Does not notice that it correlates with any temporal factors. Reports intermittent difficulty swallowing. Sometimes feels like something is stuck in his throat, but then water can get around per his report.   When discussing dyspnea, tells me when walking down to end of driveway will get very short of breath. Feels like he isn't getting enough air. Reports some orthopnea, but if he concentrates he can calm it down. Uses Symbicort once per week at night.   EKGs/Labs/Other Studies Reviewed:   The following  studies were reviewed today:  EKG:  EKG is ordered today.  The ekg ordered today demonstrates NSR 67 bpm with no acute ST/T wave changes.   Recent Labs: 03/19/2019: Magnesium 2.2 01/04/2020: ALT 28; BUN 12; Creatinine, Ser 0.83; Hemoglobin 13.4; Platelets 217; Potassium 3.9; Sodium 142; TSH 3.355  Recent Lipid Panel    Component Value Date/Time   CHOL 143 09/17/2019 0521   CHOL 158 03/29/2013 0759   TRIG 52 09/17/2019 0521   TRIG 193 03/29/2013 0759   HDL 33 (L) 09/17/2019 0521   HDL 31 (L) 03/29/2013 0759   CHOLHDL 4.3 09/17/2019 0521   VLDL 10 09/17/2019 0521   VLDL 39 03/29/2013 0759   LDLCALC 100 (H) 09/17/2019 0521   LDLCALC 88 03/29/2013 0759   LDLDIRECT 139.4 02/23/2013 1526    Home Medications   Current Meds  Medication Sig  . albuterol (VENTOLIN HFA) 108 (90 Base) MCG/ACT inhaler Inhale 2 puffs into the lungs every 6 (six) hours as needed for wheezing or shortness of breath.  Marland Kitchen amLODipine (NORVASC) 2.5 MG tablet Take 1 tablet (2.5 mg total) by mouth daily.  Marland Kitchen aspirin EC 81 MG tablet Take 81 mg by mouth daily.  Marland Kitchen atorvastatin (LIPITOR) 80 MG tablet Take 1 tablet (80 mg total) by mouth daily at 6 PM.  . budesonide-formoterol (SYMBICORT) 160-4.5 MCG/ACT inhaler Inhale 2 puffs into the lungs 2 (two) times daily.  Marland Kitchen escitalopram (LEXAPRO) 10 MG tablet TAKE 1 TABLET BY MOUTH  DAILY  . fludrocortisone (FLORINEF) 0.1 MG tablet Take 2 tablets (0.2 mg total) by mouth daily.  . isosorbide mononitrate (IMDUR) 60 MG 24 hr tablet Take 0.5 tablets (30 mg total) by mouth at bedtime.  . Multiple Vitamin (MULTIVITAMIN WITH MINERALS) TABS tablet Take 1 tablet by mouth daily.  . nicotine (NICODERM CQ - DOSED IN MG/24 HOURS) 21 mg/24hr patch Place 1 patch (21 mg total) onto the skin daily.  . nicotine polacrilex (NICORETTE) 2 MG gum Take 1 each (2 mg total) by mouth every 4 (four) hours while awake.  . nitroGLYCERIN (NITROSTAT) 0.4 MG SL tablet DISSOLVE 1 TABLET UNDER THE TONGUE EVERY 5  MINUTES AS NEEDED FOR CHEST PAIN  . Omega-3 Fatty Acids (FISH OIL) 1000 MG CAPS Take 1,000 mg by mouth daily.   . [DISCONTINUED] fludrocortisone (FLORINEF) 0.1 MG tablet TAKE 1 TABLET(0.1 MG) BY MOUTH DAILY  . [DISCONTINUED] nicotine (NICODERM CQ - DOSED IN MG/24 HOURS) 21 mg/24hr patch Place 1 patch (21 mg total) onto the skin daily.  . [DISCONTINUED] nicotine polacrilex (NICORETTE) 2 MG gum Take 1 each (2 mg total) by mouth every 4 (four) hours while awake.      Review of Systems      Review of Systems  Constitution: Negative for chills, fever and malaise/fatigue.  Cardiovascular: Positive for chest pain and dyspnea on exertion. Negative for leg swelling, near-syncope, orthopnea, palpitations and syncope.  Respiratory: Positive for shortness of breath. Negative  for cough and wheezing.   Gastrointestinal: Positive for nausea. Negative for vomiting.  Neurological: Positive for dizziness and light-headedness. Negative for weakness.   All other systems reviewed and are otherwise negative except as noted above.  Physical Exam    VS:  BP 90/62 (BP Location: Left Arm, Patient Position: Sitting, Cuff Size: Normal)   Pulse 67   Ht 5\' 5"  (1.651 m)   Wt 141 lb (64 kg)   SpO2 98%   BMI 23.46 kg/m  , BMI Body mass index is 23.46 kg/m. GEN: Well nourished, well developed, in no acute distress. HEENT: normal. Neck: Supple, no JVD, carotid bruits, or masses. Cardiac: RRR, no murmurs, rubs, or gallops. No clubbing, cyanosis, edema.  Radials/PT 2+ and equal bilaterally.  Respiratory:  Respirations regular and unlabored, clear to auscultation bilaterally. GI: Soft, nontender, nondistended, BS + x 4. MS: No deformity or atrophy. Skin: Warm and dry, no rash. Neuro:  Strength and sensation are intact. Psych: Normal affect.  Assessment & Plan    1. CAD - Reports some episodes of chest pressure that feels different than previous MI but also episodes of chest pain associated with nausea that feel  similar to previous MI. Reports dyspnea with exertion which Is likely multifactorial CAD, COPD with ongoing tobacco use. EKG today NSR with no ST/T wave changes. Known vasospasm based on 03/2019 catheterization. GDMT includes aspirin, Amlodipine, statin, Imdur. No beta blocker secondary to orthostatic hypotension. Plan for Lexiscan Myoview to rule out worsening ischemia. Hesitant to increase Imdur/Amlodipine today due to BP 90/62. Have increased Florinef today, as below, and may be able to up-titrate anti-anginal therapies at follow up.   2. COPD - Encouraged to take Symbicort 2 puffs twice daily as prescribed. Anticipate much of his dyspnea is due to COPD. Obtain CBC, CMET, TSH today to rule out alternative underlying cause of dyspnea such as anemia. May benefit from referral to pulmonology, politely declines today. Smoking cessation encouraged.   3. Tobacco abuse - Smoking cessation encouraged. Recommend utilization of [id number]. Prescription for nicotine patches and gum provided, he has good success with this method in the past.   4. HLD, LDL goal <70 - 09/2019 LDL 100. Not at goal of <70. Continue Atorvastatin 80mg  daily. Consider addition of Zetia at follow up. Hesitant to add new medications today due to nausea.   5. Orthostatic hypotension - Notices more pronounced episodes over summer months. Increase Florinef to 0.2mg  daily. Recommend adequate hydration, slow position changes, compression stockings.   6. Nausea - Unclear etiology. Recommend follow up with PCP. May benefit from PPI such as omeprazole (prilosec) or pepcid OTC. Recommend avoiding spicy, fried, or acidic foods.   Disposition: Follow up in 4 week(s) with Dr. Rockey Situ or APP   Loel Dubonnet, NP 01/05/2020, 2:23 PM

## 2020-01-04 NOTE — Patient Instructions (Addendum)
Medication Instructions:  Your physician has recommended you make the following change in your medication:     Increase Florinef to 0.2mg  every evening  Take Spiriva 2 puffs twice per day. If your breathing does not improve we will consider referral to pulmonology.   *If you need a refill on your cardiac medications before your next appointment, please call your pharmacy*   Lab Work: Your physician recommends that you return for lab work today: TSH, CMET, CBC  If you have labs (blood work) drawn today and your tests are completely normal, you will receive your results only by: Marland Kitchen MyChart Message (if you have MyChart) OR . A paper copy in the mail If you have any lab test that is abnormal or we need to change your treatment, we will call you to review the results.  Testing/Procedures: Your EKG today showed normal sinus rhythm.  Your echocardiogram in January showed normal pumping function, mild stiffening which is common, and no significant valvular abnormalities.   Your physician has requested that you have a lexiscan myoview. For further information please visit [email or web address]. Please follow instruction sheet, as given.  Follow-Up: At [facility], you and your health needs are our priority.  As part of our continuing mission to provide you with exceptional heart care, we have created designated Provider Care Teams.  These Care Teams include your primary Cardiologist (physician) and Advanced Practice Providers (APPs -  Physician Assistants and Nurse Practitioners) who all work together to provide you with the care you need, when you need it.  We recommend signing up for the patient portal called "MyChart".  Sign up information is provided on this After Visit Summary.  MyChart is used to connect with patients for Virtual Visits (Telemedicine).  Patients are able to view lab/test results, encounter notes, upcoming appointments, etc.  Non-urgent messages can be sent to your provider  as well.   To learn more about what you can do with MyChart, go to [email or web address].    Your next appointment:   4 week(s)  The format for your next appointment:   In Person  Provider:   You may see Ida Rogue, MD or one of the following Advanced Practice Providers on your designated Care Team:    Murray Hodgkins, NP  Christell Faith, PA-C  Marrianne Mood, PA-C  Laurann Montana, NP  Other Instructions  Check blood pressure once per day in the morning.  Recommend increasing intake of fluids and Gatorade or Powerade.  Recommend adding salt to your food to increase blood pressure.   [place]  Your caregiver has ordered a Stress Test with nuclear imaging. The purpose of this test is to evaluate the blood supply to your heart muscle. This procedure is referred to as a "Non-Invasive Stress Test." This is because other than having an IV started in your vein, nothing is inserted or "invades" your body. Cardiac stress tests are done to find areas of poor blood flow to the heart by determining the extent of coronary artery disease (CAD). Some patients exercise on a treadmill, which naturally increases the blood flow to your heart, while others who are  unable to walk on a treadmill due to physical limitations have a pharmacologic/chemical stress agent called Lexiscan . This medicine will mimic walking on a treadmill by temporarily increasing your coronary blood flow.   Please note: these test may take anywhere between 2-4 hours to complete  PLEASE REPORT TO [place]  WILL DIRECT YOU WHERE TO GO  Date of Procedure:_____________________________________  Arrival Time for Procedure:______________________________  Instructions regarding medication:    PLEASE NOTIFY THE OFFICE AT LEAST 24 HOURS IN ADVANCE IF YOU ARE UNABLE TO KEEP YOUR APPOINTMENT.  [phone] AND  PLEASE NOTIFY NUCLEAR MEDICINE AT [facility] AT LEAST 24 HOURS  IN ADVANCE IF YOU ARE UNABLE TO KEEP YOUR APPOINTMENT. [phone]  How to prepare for your Myoview test:  1. Do not eat or drink after midnight 2. No caffeine for 24 hours prior to test 3. No smoking 24 hours prior to test. 4. Your medication may be taken with water.  If your doctor stopped a medication because of this test, do not take that medication. 5. Ladies, please do not wear dresses.  Skirts or pants are appropriate. Please wear a short sleeve shirt. 6. No perfume, cologne or lotion. 7. Wear comfortable walking shoes. No heels!

## 2020-01-08 ENCOUNTER — Encounter: Payer: Self-pay | Admitting: *Deleted

## 2020-01-19 ENCOUNTER — Other Ambulatory Visit: Payer: Self-pay

## 2020-01-19 ENCOUNTER — Encounter
Admission: RE | Admit: 2020-01-19 | Discharge: 2020-01-19 | Disposition: A | Discharge status: Home / Self Care | Payer: 59 | Source: Ambulatory Visit | Attending: Family | Admitting: Family

## 2020-01-19 DIAGNOSIS — R079 Chest pain, unspecified: Secondary | ICD-10-CM

## 2020-01-19 LAB — NM MYOCAR MULTI W/SPECT W/WALL MOTION / EF
LV dias vol: 83 mL (ref 62–150)
LV sys vol: 28 mL
Peak HR: 93 {beats}/min
Percent HR: 57 %
Rest HR: 55 {beats}/min
SDS: 0
SRS: 5
SSS: 3
TID: 1.14

## 2020-01-19 MED ORDER — TECHNETIUM TC 99M TETROFOSMIN IV KIT
32.1100 | PACK | Freq: Once | INTRAVENOUS | Status: AC | PRN
  Administered 2020-01-19: 32.11 via INTRAVENOUS

## 2020-01-19 MED ORDER — REGADENOSON 0.4 MG/5ML IV SOLN
0.4000 mg | Freq: Once | INTRAVENOUS | Status: AC
  Administered 2020-01-19: 0.4 mg via INTRAVENOUS
  Filled 2020-01-19: qty 5

## 2020-01-19 MED ORDER — TECHNETIUM TC 99M TETROFOSMIN IV KIT
10.8000 | PACK | Freq: Once | INTRAVENOUS | Status: AC | PRN
  Administered 2020-01-19: 10.8 via INTRAVENOUS

## 2020-01-22 ENCOUNTER — Telehealth: Payer: Self-pay

## 2020-01-22 NOTE — Telephone Encounter (Signed)
-----   Message from Loel Dubonnet, NP sent at 01/22/2020  7:36 AM EDT ----- Low risk stress test. Normal pumping function. CT images show known mild coronary calcification. Good result!

## 2020-01-22 NOTE — Telephone Encounter (Signed)
Call to patient to review stress test results.    Pt verbalized understanding and has no further questions at this time.    Advised pt to call for any further questions or concerns.  No further orders.   

## 2020-02-07 ENCOUNTER — Other Ambulatory Visit: Payer: Self-pay

## 2020-02-07 ENCOUNTER — Other Ambulatory Visit
Admission: RE | Admit: 2020-02-07 | Discharge: 2020-02-07 | Disposition: A | Discharge status: Home / Self Care | Payer: 59 | Source: Ambulatory Visit | Attending: Family | Admitting: Family

## 2020-02-07 ENCOUNTER — Encounter: Payer: Self-pay | Admitting: Family

## 2020-02-07 ENCOUNTER — Ambulatory Visit (INDEPENDENT_AMBULATORY_CARE_PROVIDER_SITE_OTHER): Payer: 59 | Admitting: Family

## 2020-02-07 VITALS — BP 110/70 | HR 88 | Ht 65.0 in | Wt 141.0 lb

## 2020-02-07 DIAGNOSIS — J449 Chronic obstructive pulmonary disease, unspecified: Secondary | ICD-10-CM | POA: Diagnosis not present

## 2020-02-07 DIAGNOSIS — Z720 Tobacco use: Secondary | ICD-10-CM | POA: Diagnosis not present

## 2020-02-07 DIAGNOSIS — I201 Angina pectoris with documented spasm: Secondary | ICD-10-CM

## 2020-02-07 DIAGNOSIS — E785 Hyperlipidemia, unspecified: Secondary | ICD-10-CM | POA: Insufficient documentation

## 2020-02-07 DIAGNOSIS — I2510 Atherosclerotic heart disease of native coronary artery without angina pectoris: Secondary | ICD-10-CM

## 2020-02-07 DIAGNOSIS — I951 Orthostatic hypotension: Secondary | ICD-10-CM

## 2020-02-07 LAB — LDL CHOLESTEROL, DIRECT: Direct LDL: 102.5 mg/dL — ABNORMAL HIGH (ref 0–99)

## 2020-02-07 MED ORDER — ATORVASTATIN CALCIUM 80 MG PO TABS: 80.0000 mg | ORAL_TABLET | Freq: Every day | ORAL | 1 refills | Status: DC

## 2020-02-07 MED ORDER — ISOSORBIDE MONONITRATE ER 60 MG PO TB24: 30.0000 mg | ORAL_TABLET | Freq: Every day | ORAL | 1 refills | Status: DC

## 2020-02-07 NOTE — Patient Instructions (Addendum)
Medication Instructions:  No medication changes today.   *If you need a refill on your cardiac medications before your next appointment, please call your pharmacy*  Lab Work: Your physician recommends that you have lab work today: direct [place] entrance, 1st desk on the right (Registration) to check in  If you have labs (blood work) drawn today and your tests are completely normal, you will receive your results only by: Marland Kitchen MyChart Message (if you have MyChart) OR . A paper copy in the mail If you have any lab test that is abnormal or we need to change your treatment, we will call you to review the results.   Testing/Procedures: Your recent stress test was low risk. It showed normal pumping function and no evidence of ischemia or blockage. Great result!  Your EKG today showed normal sinus rhythm and was stable. This was a good result.  Follow-Up: At [facility], you and your health needs are our priority.  As part of our continuing mission to provide you with exceptional heart care, we have created designated Provider Care Teams.  These Care Teams include your primary Cardiologist (physician) and Advanced Practice Providers (APPs -  Physician Assistants and Nurse Practitioners) who all work together to provide you with the care you need, when you need it.  We recommend signing up for the patient portal called "MyChart".  Sign up information is provided on this After Visit Summary.  MyChart is used to connect with patients for Virtual Visits (Telemedicine).  Patients are able to view lab/test results, encounter notes, upcoming appointments, etc.  Non-urgent messages can be sent to your provider as well.   To learn more about what you can do with MyChart, go to [email or web address].    Your next appointment:   6 month(s)  The format for your next appointment:   In Person  Provider:   You may see Ida Rogue, MD or one of the following Advanced Practice Providers on  your designated Care Team:    Murray Hodgkins, NP  Christell Faith, PA-C  Marrianne Mood, PA-C  Laurann Montana, NP

## 2020-02-07 NOTE — Progress Notes (Signed)
Office Visit    Patient Name: Isaiah Rangel Date of Encounter: 02/07/2020  Primary Care Provider:  Owens Loffler, MD Primary Cardiologist:  Ida Rogue, MD Electrophysiologist:  None   Chief Complaint    Isaiah Rangel is a 59 y.o. male with a hx of CAD, orthostatic hypotension, HLD, tobacco use, COPD presents today for follow-up after stress test  Past Medical History    Past Medical History:  Diagnosis Date  . Allergy   . Anxiety   . BPH (benign prostatic hyperplasia)   . COPD (chronic obstructive pulmonary disease) ([place])   . Coronary vasospasm ([place])    a. 03/2019 Cath: RCA 43m but only 40% after intracoronary ntg-->Imdur started.  Marland Kitchen History of echocardiogram    a. 03/2019 Echo: EF 50-55%, nl RV size/fxn. Trace MR/TR.  . Non-obstructive CAD (coronary artery disease)    a. 03/2013 Cath: min irregs. EF >55%; b. 03/2019 Cath: LM nl, LAD nl, LCX nl, OM1 40, RCA 30p, 71m-->40% after IC NTG.  . OA (osteoarthritis)   . Tobacco abuse    Past Surgical History:  Procedure Laterality Date  . BACK SURGERY    . CARDIAC CATHETERIZATION  04/06/13   [facility]- minor luminal irregularities, otherwise normal cors; EF > 55%. Medical management and smoking cessation recommended  . KNEE ARTHROSCOPY W/ PARTIAL MEDIAL MENISCECTOMY Right 2012   [place], 2012  . LEFT HEART CATH AND CORONARY ANGIOGRAPHY N/A 03/20/2019   Procedure: LEFT HEART CATH AND CORONARY ANGIOGRAPHY;  Surgeon: Troy Sine, MD;  Location: [place];  Service: Cardiovascular;  Laterality: N/A;  . NASAL SINUS SURGERY    . neck fusion    . VASECTOMY      Allergies  Allergies  Allergen Reactions  . Other Nausea And Vomiting and Other (See Comments)    Quail eggs caused severe stomach pain and n/v    History of Present Illness    Isaiah Rangel is a 59 y.o. male with a hx of orthostatic hypotension, tobacco abuse, COPD, CAD s/p NSTEMI in setting of vasospasm 03/2019, Raynaud's, HLD, GERD, anxiety last seen  01/04/2020  Initial cardiac cath in 2014 with minor irregularities. Continued intermittent chest discomfort was medically managed with long acting nitrate. July 2020 presented with chest pain that did not resolve with nitroglycerin, nausea, vomiting, dyspnea. Diagnostic cath initially with mid RCA stenosis however after intracoronary nitro the vessel dilated and only 40% stenosis was noted. His Imdur was uptitrated to 60mg .   Clinic visit 05/2019 noted no significant improvement on higher dose Imdur. His Imdur was reduced to 30mg  and Amlodipine 2.5mg  was added. At clinic visit 06/2019 he reported feeling overall well and no changes were made.   Admitted January 2021 for chest pain after forgetting his Imdur for 2 nights. His troponin was negative and his episode was presumed due to missing his Imdur.   Clinic visit 01/04/2019 reported heavy chest feeling, nausea, dyspnea.  He was recommended to take his Symbicort twice daily as recommended as he was only taking it once per day.  He was recommended for [facility].  His Florinef was increased to 0.2 mg daily due to hypotension.  Reports feeling overall well.  Just returned from a weeklong trip to [place] with his wife to visit her family and enjoyed his time there.  Tells me his breathing has much improved since taking his Symbicort twice daily.  Reports no orthopnea, PND, wheeze, cough.  His nausea has improved.  Reports he will  intermittently get a sensation of nausea that "rushes over "him but then disappears.  He has tried to manage this by eating small regular meals with good response.  Reports improvement in his orthostatic symptoms.  Tells me he will get lightheaded with position changes "once a bloom".  But overall much improved since increased dose of Florinef.  Is careful to stay adequately hydrated and drinks Gatorade regularly.  EKGs/Labs/Other Studies Reviewed:   The following studies were reviewed today:  Lexiscan Myoview 01/19/20  Pharmacological myocardial perfusion imaging study with no significant  ischemia Normal wall motion, EF estimated at 65% No EKG changes concerning for ischemia at peak stress or in recovery. CT attenuation correction images with mild coronary calcification in the LAD, no significant aortic atherosclerosis Low risk scan  EKG:  EKG is ordered today.  The ekg ordered today demonstrates NSR 88 bpm with no acute ST/T wave changes.   Recent Labs: 03/19/2019: Magnesium 2.2 01/04/2020: ALT 28; BUN 12; Creatinine, Ser 0.83; Hemoglobin 13.4; Platelets 217; Potassium 3.9; Sodium 142; TSH 3.355  Recent Lipid Panel    Component Value Date/Time   CHOL 143 09/17/2019 0521   CHOL 158 03/29/2013 0759   TRIG 52 09/17/2019 0521   TRIG 193 03/29/2013 0759   HDL 33 (L) 09/17/2019 0521   HDL 31 (L) 03/29/2013 0759   CHOLHDL 4.3 09/17/2019 0521   VLDL 10 09/17/2019 0521   VLDL 39 03/29/2013 0759   LDLCALC 100 (H) 09/17/2019 0521   LDLCALC 88 03/29/2013 0759   LDLDIRECT 139.4 02/23/2013 1526    Home Medications   Current Meds  Medication Sig  . albuterol (VENTOLIN HFA) 108 (90 Base) MCG/ACT inhaler Inhale 2 puffs into the lungs every 6 (six) hours as needed for wheezing or shortness of breath.  Marland Kitchen amLODipine (NORVASC) 2.5 MG tablet Take 1 tablet (2.5 mg total) by mouth daily.  Marland Kitchen aspirin EC 81 MG tablet Take 81 mg by mouth daily.  Marland Kitchen atorvastatin (LIPITOR) 80 MG tablet Take 1 tablet (80 mg total) by mouth daily at 6 PM.  . budesonide-formoterol (SYMBICORT) 160-4.5 MCG/ACT inhaler Inhale 2 puffs into the lungs 2 (two) times daily.  Marland Kitchen escitalopram (LEXAPRO) 10 MG tablet TAKE 1 TABLET BY MOUTH  DAILY  . fludrocortisone (FLORINEF) 0.1 MG tablet Take 2 tablets (0.2 mg total) by mouth daily.  . isosorbide mononitrate (IMDUR) 60 MG 24 hr tablet Take 0.5 tablets (30 mg total) by mouth at bedtime.  . Multiple Vitamin (MULTIVITAMIN WITH MINERALS) TABS tablet Take 1 tablet by mouth daily.  . nicotine (NICODERM CQ -  DOSED IN MG/24 HOURS) 21 mg/24hr patch Place 1 patch (21 mg total) onto the skin daily.  . nicotine polacrilex (NICORETTE) 2 MG gum Take 1 each (2 mg total) by mouth every 4 (four) hours while awake.  . nitroGLYCERIN (NITROSTAT) 0.4 MG SL tablet DISSOLVE 1 TABLET UNDER THE TONGUE EVERY 5 MINUTES AS NEEDED FOR CHEST PAIN  . Omega-3 Fatty Acids (FISH OIL) 1000 MG CAPS Take 1,000 mg by mouth daily.       Review of Systems    Review of Systems  Constitution: Negative for chills, fever and malaise/fatigue.  Cardiovascular: Positive for dyspnea on exertion. Negative for chest pain, leg swelling, near-syncope, orthopnea, palpitations and syncope.  Respiratory: Negative for cough, shortness of breath and wheezing.   Gastrointestinal: Positive for nausea. Negative for vomiting.  Neurological: Negative for dizziness, light-headedness and weakness.   All other systems reviewed and are otherwise negative except as noted  above.  Physical Exam    VS:  BP 110/70 (BP Location: Left Arm, Patient Position: Sitting, Cuff Size: Normal)   Pulse 88   Ht 5\' 5"  (1.651 m)   Wt 141 lb (64 kg)   SpO2 93%   BMI 23.46 kg/m  , BMI Body mass index is 23.46 kg/m. GEN: Well nourished, well developed, in no acute distress. HEENT: normal. Neck: Supple, no JVD, carotid bruits, or masses. Cardiac: RRR, no murmurs, rubs, or gallops. No clubbing, cyanosis, edema.  Radials/PT 2+ and equal bilaterally.  Respiratory:  Respirations regular and unlabored, clear to auscultation bilaterally. GI: Soft, nontender, nondistended, BS + x 4. MS: No deformity or atrophy. Skin: Warm and dry, no rash. Neuro:  Strength and sensation are intact. Psych: Normal affect.  Assessment & Plan    1. CAD - EKG today NSR with no ST/T wave changes. Known vasospasm based on 03/2019 catheterization. GDMT includes aspirin, Amlodipine, statin, Imdur. No beta blocker secondary to orthostatic hypotension.  Lexiscan Myoview 01/19/20 low risk study  with no evidence of ischemia.  Reports resolution of the chest pressure.  No indication for further ischemic evaluation this time.  2. COPD -reports compliance with Symbicort 2 puffs twice daily as prescribed.  Significant improvement in dyspnea since taking as prescribed.  Labs in May included normal CBC, TSH.  We discussed possible referral to pulmonology but as the symptoms symptomatically improved he prefers to continue to follow with primary care.  Smoking cessation encouraged.    3. Tobacco abuse -has decreased his smoking since last visit, encouraged to continue.  Smoking cessation encouraged. Recommend utilization of 1800QUITNOW.  4. HLD, LDL goal <70 - 09/2019 LDL 100. Not at goal of <70. Continue Atorvastatin 80mg  daily, refill provided today.  Direct LDL today.  If LDL remains greater than 70 add Zetia 10 mg daily.   5. Orthostatic hypotension -dramatic improvement since increased dose of Florinef to 0.2 mg daily.  Continue adequate hydration, slow position changes. Recommend compression stockings.   6. Nausea -improved since last clinic visit by eating small regular meals.  Recommend avoiding spicy, fried, or acidic foods.  Encouraged to follow-up with PCP if this recurs.  Disposition: Follow up in 6 month(s) with Dr. Rockey Situ or APP   Loel Dubonnet, NP 02/07/2020, 3:48 PM

## 2020-02-08 ENCOUNTER — Telehealth: Payer: Self-pay

## 2020-02-08 DIAGNOSIS — E785 Hyperlipidemia, unspecified: Secondary | ICD-10-CM

## 2020-02-08 MED ORDER — EZETIMIBE 10 MG PO TABS
10.0000 mg | ORAL_TABLET | Freq: Every day | ORAL | 3 refills | Status: DC
Start: 2020-02-08 — End: 2020-03-29

## 2020-02-08 NOTE — Telephone Encounter (Signed)
Call to patient to review labs.    Pt verbalized understanding and has no further questions at this time.    Advised pt to call for any further questions or concerns.  Orders placed as advised.   

## 2020-02-08 NOTE — Telephone Encounter (Signed)
-----   Message from Loel Dubonnet, NP sent at 02/08/2020  7:35 AM EDT ----- LDL above goal of 70. Continue Atorvastatin 80mg  daily. Start Zetia 10mg  daily. Plan to repeat lipid/liver testing in 8 weeks.

## 2020-03-02 ENCOUNTER — Other Ambulatory Visit: Payer: Self-pay | Admitting: Cardiovascular Disease

## 2020-03-28 ENCOUNTER — Other Ambulatory Visit: Payer: Self-pay | Admitting: Cardiovascular Disease

## 2020-03-28 ENCOUNTER — Other Ambulatory Visit: Payer: Self-pay | Admitting: Family Medicine

## 2020-03-29 ENCOUNTER — Other Ambulatory Visit: Payer: Self-pay | Admitting: *Deleted

## 2020-03-29 DIAGNOSIS — I951 Orthostatic hypotension: Secondary | ICD-10-CM

## 2020-03-29 MED ORDER — EZETIMIBE 10 MG PO TABS: 10.0000 mg | ORAL_TABLET | Freq: Every day | ORAL | 0 refills | Status: DC

## 2020-03-29 MED ORDER — FLUDROCORTISONE ACETATE 0.1 MG PO TABS: 0.2000 mg | ORAL_TABLET | Freq: Every day | ORAL | 0 refills | Status: DC

## 2020-05-15 ENCOUNTER — Other Ambulatory Visit: Payer: Self-pay | Admitting: Family

## 2020-05-16 ENCOUNTER — Other Ambulatory Visit: Payer: Self-pay | Admitting: Family

## 2020-05-16 DIAGNOSIS — I951 Orthostatic hypotension: Secondary | ICD-10-CM

## 2020-05-16 NOTE — Telephone Encounter (Signed)
Refill request receiving from [facility], pt was given 3 months supply in June 2021 with 3 refills.  Attempted to call patient to discuss no answer or VM.  TSuits MHA RN CCM

## 2020-05-31 ENCOUNTER — Other Ambulatory Visit: Payer: Self-pay | Admitting: Family

## 2020-06-03 ENCOUNTER — Emergency Department
Admission: EM | Admit: 2020-06-03 | Discharge: 2020-06-03 | Disposition: A | Discharge status: Home / Self Care | Payer: 59 | Attending: Emergency Medicine | Admitting: Emergency Medicine

## 2020-06-03 ENCOUNTER — Encounter: Payer: Self-pay | Admitting: Emergency Medicine

## 2020-06-03 ENCOUNTER — Other Ambulatory Visit: Payer: Self-pay

## 2020-06-03 ENCOUNTER — Telehealth: Payer: Self-pay

## 2020-06-03 ENCOUNTER — Emergency Department: Payer: 59

## 2020-06-03 DIAGNOSIS — J449 Chronic obstructive pulmonary disease, unspecified: Secondary | ICD-10-CM | POA: Diagnosis not present

## 2020-06-03 DIAGNOSIS — Z951 Presence of aortocoronary bypass graft: Secondary | ICD-10-CM | POA: Diagnosis not present

## 2020-06-03 DIAGNOSIS — I2510 Atherosclerotic heart disease of native coronary artery without angina pectoris: Secondary | ICD-10-CM | POA: Diagnosis not present

## 2020-06-03 DIAGNOSIS — Z79899 Other long term (current) drug therapy: Secondary | ICD-10-CM | POA: Insufficient documentation

## 2020-06-03 DIAGNOSIS — F17210 Nicotine dependence, cigarettes, uncomplicated: Secondary | ICD-10-CM | POA: Diagnosis not present

## 2020-06-03 DIAGNOSIS — R319 Hematuria, unspecified: Secondary | ICD-10-CM | POA: Diagnosis present

## 2020-06-03 DIAGNOSIS — Z7982 Long term (current) use of aspirin: Secondary | ICD-10-CM | POA: Insufficient documentation

## 2020-06-03 DIAGNOSIS — E876 Hypokalemia: Secondary | ICD-10-CM

## 2020-06-03 DIAGNOSIS — Z7951 Long term (current) use of inhaled steroids: Secondary | ICD-10-CM | POA: Insufficient documentation

## 2020-06-03 DIAGNOSIS — N200 Calculus of kidney: Secondary | ICD-10-CM | POA: Diagnosis not present

## 2020-06-03 LAB — URINALYSIS, COMPLETE (UACMP) WITH MICROSCOPIC
Bacteria, UA: NONE SEEN
Bilirubin Urine: NEGATIVE
Glucose, UA: NEGATIVE mg/dL
Ketones, ur: NEGATIVE mg/dL
Leukocytes,Ua: NEGATIVE
Nitrite: NEGATIVE
Protein, ur: NEGATIVE mg/dL
Specific Gravity, Urine: 1.006 (ref 1.005–1.030)
pH: 8 (ref 5.0–8.0)

## 2020-06-03 LAB — CBC
HCT: 38.3 % — ABNORMAL LOW (ref 39.0–52.0)
Hemoglobin: 13.8 g/dL (ref 13.0–17.0)
MCH: 32.5 pg (ref 26.0–34.0)
MCHC: 36 g/dL (ref 30.0–36.0)
MCV: 90.1 fL (ref 80.0–100.0)
Platelets: 197 10*3/uL (ref 150–400)
RBC: 4.25 MIL/uL (ref 4.22–5.81)
RDW: 17 % — ABNORMAL HIGH (ref 11.5–15.5)
WBC: 9.2 10*3/uL (ref 4.0–10.5)
nRBC: 0 % (ref 0.0–0.2)

## 2020-06-03 LAB — BASIC METABOLIC PANEL
Anion gap: 9 (ref 5–15)
BUN: 12 mg/dL (ref 6–20)
CO2: 30 mmol/L (ref 22–32)
Calcium: 8.8 mg/dL — ABNORMAL LOW (ref 8.9–10.3)
Chloride: 104 mmol/L (ref 98–111)
Creatinine, Ser: 0.77 mg/dL (ref 0.61–1.24)
GFR calc Af Amer: >60 mL/min (ref >60)
GFR calc non Af Amer: >60 mL/min (ref >60)
Glucose, Bld: 76 mg/dL (ref 70–99)
Potassium: 2.9 mmol/L — ABNORMAL LOW (ref 3.5–5.1)
Sodium: 143 mmol/L (ref 135–145)

## 2020-06-03 LAB — SAMPLE TO BLOOD BANK

## 2020-06-03 MED ORDER — IOHEXOL 300 MG/ML  SOLN
75.0000 mL | Freq: Once | INTRAMUSCULAR | Status: AC | PRN
  Administered 2020-06-03: 75 mL via INTRAVENOUS
  Filled 2020-06-03: qty 75

## 2020-06-03 MED ORDER — TAMSULOSIN HCL 0.4 MG PO CAPS: 0.4000 mg | ORAL_CAPSULE | Freq: Every day | ORAL | 0 refills | Status: DC

## 2020-06-03 MED ORDER — POTASSIUM CHLORIDE 10 MEQ/100ML IV SOLN
10.0000 meq | Freq: Once | INTRAVENOUS | Status: AC
  Administered 2020-06-03: 10 meq via INTRAVENOUS
  Filled 2020-06-03: qty 100

## 2020-06-03 MED ORDER — OXYCODONE-ACETAMINOPHEN 5-325 MG PO TABS: 1.0000 | ORAL_TABLET | Freq: Four times a day (QID) | ORAL | 0 refills | Status: DC | PRN

## 2020-06-03 MED ORDER — OXYCODONE-ACETAMINOPHEN 5-325 MG PO TABS: 1.0000 | ORAL_TABLET | Freq: Once | ORAL | Status: DC

## 2020-06-03 MED ORDER — SODIUM CHLORIDE 0.9 % IV BOLUS
1000.0000 mL | Freq: Once | INTRAVENOUS | Status: AC
  Administered 2020-06-03: 1000 mL via INTRAVENOUS

## 2020-06-03 MED ORDER — ONDANSETRON 4 MG PO TBDP: 8.0000 mg | ORAL_TABLET | Freq: Once | ORAL | Status: DC

## 2020-06-03 MED ORDER — POTASSIUM CHLORIDE ER 10 MEQ PO TBCR: 10.0000 meq | EXTENDED_RELEASE_TABLET | Freq: Every day | ORAL | 0 refills | Status: DC

## 2020-06-03 MED ORDER — ONDANSETRON HCL 4 MG/2ML IJ SOLN
4.0000 mg | Freq: Once | INTRAMUSCULAR | Status: AC
  Administered 2020-06-03: 4 mg via INTRAVENOUS
  Filled 2020-06-03: qty 2

## 2020-06-03 MED ORDER — POTASSIUM CHLORIDE CRYS ER 20 MEQ PO TBCR
40.0000 meq | EXTENDED_RELEASE_TABLET | Freq: Once | ORAL | Status: AC
  Administered 2020-06-03: 40 meq via ORAL
  Filled 2020-06-03: qty 2

## 2020-06-03 MED ORDER — MORPHINE SULFATE (PF) 4 MG/ML IV SOLN
4.0000 mg | Freq: Once | INTRAVENOUS | Status: AC
  Administered 2020-06-03: 4 mg via INTRAVENOUS

## 2020-06-03 NOTE — Telephone Encounter (Signed)
See note below with Larene Beach RN.

## 2020-06-03 NOTE — ED Triage Notes (Signed)
First Nurse Note: Arrives c/o hematuria and black tarry stools.  AAOx3.  Skin warm and dry. NAD

## 2020-06-03 NOTE — ED Notes (Signed)
Pt presents to the ED for hematuria, pt states that it started couple of days ago and he began having L flank pain yesterday. Pt is A&Ox4 and NAD. Pt states that urine is "kool-aid" colored. Pt states he's never had a kidney stone before.

## 2020-06-03 NOTE — ED Triage Notes (Signed)
Pt presents to ED via POV with c/o L flank pain that started yesterday, hematuria since Saturday, and dark stools that started this morning. Pt A&O x4, VSS in triage.   Pt denies hx of kidney stones at this time.

## 2020-06-03 NOTE — ED Notes (Signed)
[place] and type and screen sent to the lab.   

## 2020-06-03 NOTE — ED Notes (Signed)
Pt transported to CT at this time.

## 2020-06-03 NOTE — ED Notes (Signed)
Pt alert and oriented X 4, stable for discharge. RR even and unlabored, color WNL. Discussed discharge instructions and follow-up as directed. Discharge medications discussed if provided. Pt had opportunity to ask questions if necessary and RN to provide patient/family eduction.  

## 2020-06-03 NOTE — Telephone Encounter (Signed)
Contacted pt's wife, Mitzi, who reports she has already talked to a nurse here who advised her to take pt to the ER at noon today. She will bring him to the ER at that time.

## 2020-06-03 NOTE — Telephone Encounter (Signed)
[place] Night - Client Nonclinical Telephone Record  [facility] Client [place] Night - Client Client Site [place] Physician Owens Loffler - MD Contact Type Call Who Is Calling Patient / Member / Family / Caregiver Caller Name [place] Phone Number [phone] Patient Name Isaiah Rangel Patient DOB 1960/12/09 Call Type Message Only Information Provided Reason for Call Request to Schedule Office Appointment Initial Comment Caller states that her husband is urinating blood. Declined triage. Disp. Time Disposition Final User 06/01/2020 8:47:23 AM General Information Provided Yes Windy Canny Call Closed By: Windy Canny Transaction Date/Time: 06/01/2020 8:44:40 AM (ET)

## 2020-06-03 NOTE — ED Provider Notes (Signed)
[facility] Emergency Department Provider Note  ____________________________________________  Time seen: Approximately 4:17 PM  I have reviewed the triage vital signs and the nursing notes.   HISTORY  Chief Complaint Hematuria and Rectal Bleeding    HPI Isaiah Rangel is a 59 y.o. male who presents emergency department complaining of right flank and left lower quadrant abdominal pain.  Patient states that 3 days ago he started with hematuria.  Patient states that he had frank blood, then this resolved and he went for a period of almost 24 hours with normal urination.  Patient then returned with hematuria and had a left flank pain.  No history of nephrolithiasis.  No history of hematuria.  Patient states that today he also had some dark stools.  He says that this was atypical for him.  No history of GI bleed.  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  He states that the pain, location is not similar to his ischemic enteritis.  Patient denied seeing any actual blood in the stool.  Patient does have a history of chronic back pain due to an injury in his teen years.  He states that the pain is in a different location and different type than his back pain.  No bowel or bladder dysfunction, saddle anesthesia or paresthesias.         Past Medical History:  Diagnosis Date  . Allergy   . Anxiety   . BPH (benign prostatic hyperplasia)   . COPD (chronic obstructive pulmonary disease) ([place])   . Coronary vasospasm ([place])    a. 03/2019 Cath: RCA 65m but only 40% after intracoronary ntg-->Imdur started.  Marland Kitchen History of echocardiogram    a. 03/2019 Echo: EF 50-55%, nl RV size/fxn. Trace MR/TR.  . Non-obstructive CAD (coronary artery disease)    a. 03/2013 Cath: min irregs. EF >55%; b. 03/2019 Cath: LM nl, LAD nl, LCX nl, OM1 40, RCA 30p, 22m-->40% after IC NTG.  . OA (osteoarthritis)   . Tobacco abuse     Patient Active Problem List   Diagnosis Date  Noted  . NSTEMI (non-ST elevated myocardial infarction) ([place]) 03/20/2019  . COPD (chronic obstructive pulmonary disease) with chronic bronchitis ([place]) 09/05/2018  . Acute ischemic enteritis ([place]) 11/26/2017  . Coronary artery disease involving native coronary artery of native heart without angina pectoris 02/21/2016  . Cerebrovascular disease 04/29/2015  . Radicular pain of right lower back 12/05/2013  . S/P cardiac catheterization 04/19/2013  . Smoking 09/09/2011  . Mixed hyperlipidemia 04/09/2010  . GERD 09/21/2008  . ERECTILE DYSFUNCTION 09/20/2008    Past Surgical History:  Procedure Laterality Date  . BACK SURGERY    . CARDIAC CATHETERIZATION  04/06/13   [facility]- minor luminal irregularities, otherwise normal cors; EF > 55%. Medical management and smoking cessation recommended  . KNEE ARTHROSCOPY W/ PARTIAL MEDIAL MENISCECTOMY Right 2012   [place], 2012  . LEFT HEART CATH AND CORONARY ANGIOGRAPHY N/A 03/20/2019   Procedure: LEFT HEART CATH AND CORONARY ANGIOGRAPHY;  Surgeon: Troy Sine, MD;  Location: [facility];  Service: Cardiovascular;  Laterality: N/A;  . NASAL SINUS SURGERY    . neck fusion    . VASECTOMY      Prior to Admission medications   Medication Sig Start Date End Date Taking? Authorizing Provider  albuterol (VENTOLIN HFA) 108 (90 Base) MCG/ACT inhaler Inhale 2 puffs into the lungs every 6 (six) hours as needed for wheezing or shortness of breath.  [provider]  amLODipine (NORVASC) 2.5 MG tablet TAKE 1 TABLET BY MOUTH  DAILY 03/28/20   Minna Merritts, MD  aspirin EC 81 MG tablet Take 81 mg by mouth daily.    [provider]  atorvastatin (LIPITOR) 80 MG tablet Take 1 tablet (80 mg total) by mouth daily at 6 PM. 02/07/20   Loel Dubonnet, NP  budesonide-formoterol (SYMBICORT) 160-4.5 MCG/ACT inhaler Inhale 2 puffs into the lungs 2 (two) times daily. 09/05/18   [name], [name], MD  escitalopram (LEXAPRO) 10 MG tablet TAKE 1 TABLET BY  MOUTH  DAILY 03/28/20   [name], [name], MD  ezetimibe (ZETIA) 10 MG tablet TAKE 1 TABLET BY MOUTH  DAILY 06/03/20   Loel Dubonnet, NP  fludrocortisone (FLORINEF) 0.1 MG tablet TAKE 2 TABLETS BY MOUTH  DAILY 05/16/20   Loel Dubonnet, NP  isosorbide mononitrate (IMDUR) 30 MG 24 hr tablet TAKE 1 TABLET BY MOUTH  DAILY 03/05/20   Loel Dubonnet, NP  isosorbide mononitrate (IMDUR) 60 MG 24 hr tablet Take 0.5 tablets (30 mg total) by mouth at bedtime. 02/07/20   Loel Dubonnet, NP  Multiple Vitamin (MULTIVITAMIN WITH MINERALS) TABS tablet Take 1 tablet by mouth daily.    [provider]  nicotine (NICODERM CQ - DOSED IN MG/24 HOURS) 21 mg/24hr patch Place 1 patch (21 mg total) onto the skin daily. 01/04/20   Loel Dubonnet, NP  nicotine polacrilex (NICORETTE) 2 MG gum Take 1 each (2 mg total) by mouth every 4 (four) hours while awake. 01/04/20   Loel Dubonnet, NP  nitroGLYCERIN (NITROSTAT) 0.4 MG SL tablet DISSOLVE 1 TABLET UNDER THE TONGUE EVERY 5 MINUTES AS NEEDED FOR CHEST PAIN 06/06/19   Minna Merritts, MD  Omega-3 Fatty Acids (FISH OIL) 1000 MG CAPS Take 1,000 mg by mouth daily.     [provider]  oxyCODONE-acetaminophen (PERCOCET/ROXICET) 5-325 MG tablet Take 1 tablet by mouth every 6 (six) hours as needed for severe pain. 06/03/20   Jailyne Chieffo, Charline Bills, PA-C  potassium chloride (KLOR-CON) 10 MEQ tablet Take 1 tablet (10 mEq total) by mouth daily. 06/03/20   Caysie Minnifield, Charline Bills, PA-C  tamsulosin (FLOMAX) 0.4 MG CAPS capsule Take 1 capsule (0.4 mg total) by mouth daily. 06/03/20   Zakia Sainato, Charline Bills, PA-C    Allergies Other  Family History  Problem Relation Age of Onset  . Arthritis Father   . Coronary artery disease Father   . Hypertension Father   . CVA Father 15  . Colon cancer Mother   . Heart attack Brother   . Heart attack Brother   . Coronary artery disease Brother   . Esophageal cancer Neg Hx   . Stomach cancer Neg Hx   . Rectal cancer Neg Hx      Social History Social History   Tobacco Use  . Smoking status: Current Every Day Smoker    Packs/day: 1.00    Years: 30.00    Pack years: 30.00    Types: Cigarettes  . Smokeless tobacco: Never Used  . Tobacco comment: he is aware he needs to quit   Vaping Use  . Vaping Use: Never used  Substance Use Topics  . Alcohol use: No    Alcohol/week: 0.0 standard drinks  . Drug use: No     Review of Systems  Constitutional: No fever/chills Eyes: No visual changes. No discharge ENT: No upper respiratory complaints. Cardiovascular: no chest pain. Respiratory: no cough. No SOB. Gastrointestinal:  L flank/L side abdominal pain.  No nausea, no vomiting.  No diarrhea.  No constipation. Genitourinary: Negative for dysuria. Positive hematuria Musculoskeletal: Negative for musculoskeletal pain. Skin: Negative for rash, abrasions, lacerations, ecchymosis. Neurological: Negative for headaches, focal weakness or numbness. 10-point ROS otherwise negative.  ____________________________________________   PHYSICAL EXAM:  VITAL SIGNS: ED Triage Vitals  Enc Vitals Group     BP 06/03/20 1401 128/80     Pulse Rate 06/03/20 1401 65     Resp 06/03/20 1401 18     Temp 06/03/20 1401 98.2 F (36.8 C)     Temp Source 06/03/20 1401 Oral     SpO2 06/03/20 1401 99 %     Weight 06/03/20 1401 130 lb (59 kg)     Height 06/03/20 1401 5\' 5"  (1.651 m)     Head Circumference --      Peak Flow --      Pain Score 06/03/20 1410 5     Pain Loc --      Pain Edu? --      Excl. in [place]? --      Constitutional: Alert and oriented. Well appearing and in no acute distress. Eyes: Conjunctivae are normal. PERRL. EOMI. Head: Atraumatic. ENT:      Ears:       Nose: No congestion/rhinnorhea.      Mouth/Throat: Mucous membranes are moist.  Neck: No stridor.    Cardiovascular: Normal rate, regular rhythm. Normal S1 and S2.  Good peripheral circulation. Respiratory: Normal respiratory effort without  tachypnea or retractions. Lungs CTAB. Good air entry to the bases with no decreased or absent breath sounds. Gastrointestinal: No visible external abdominal wall findings.  Bowel sounds 4 quadrants. Soft and nontender to palpation all 4 quadrants.. No guarding or rigidity. No palpable masses. No distention.  Positive left-sided CVA tenderness. Musculoskeletal: Full range of motion to all extremities. No gross deformities appreciated. Neurologic:  Normal speech and language. No gross focal neurologic deficits are appreciated.  Skin:  Skin is warm, dry and intact. No rash noted. Psychiatric: Mood and affect are normal. Speech and behavior are normal. Patient exhibits appropriate insight and judgement.   ____________________________________________   LABS (all labs ordered are listed, but only abnormal results are displayed)  Labs Reviewed  URINALYSIS, COMPLETE (UACMP) WITH MICROSCOPIC - Abnormal; Notable for the following components:      Result Value   Color, Urine YELLOW (*)    APPearance CLEAR (*)    Hgb urine dipstick LARGE (*)    All other components within normal limits  BASIC METABOLIC PANEL - Abnormal; Notable for the following components:   Potassium 2.9 (*)    Calcium 8.8 (*)    All other components within normal limits  CBC - Abnormal; Notable for the following components:   HCT 38.3 (*)    RDW 17.0 (*)    All other components within normal limits  SAMPLE TO BLOOD BANK   ____________________________________________  EKG   ____________________________________________  RADIOLOGY I personally viewed and evaluated these images as part of my medical decision making, as well as reviewing the written report by the radiologist.  CT ABDOMEN PELVIS W CONTRAST  Result Date: 06/03/2020 CLINICAL DATA:  Hematuria and left lower quadrant abdominal pain. Dark stools. EXAM: CT ABDOMEN AND PELVIS WITH CONTRAST TECHNIQUE: Multidetector CT imaging of the abdomen and pelvis was  performed using the standard protocol following bolus administration of intravenous contrast. CONTRAST:  66mL OMNIPAQUE IOHEXOL 300 MG/ML  SOLN COMPARISON:  Abdominopelvic  CT 11/26/2017 FINDINGS: Lower chest: Dependent opacities in both lower lobes typical of atelectasis. No pleural fluid. The heart is normal in size. Hepatobiliary: Borderline hepatic steatosis with mild diffusely decreased hepatic density. No focal hepatic lesion. Gallbladder physiologically distended, no calcified stone. No biliary dilatation. Pancreas: No ductal dilatation or inflammation. Spleen: Normal in size without focal abnormality. Adrenals/Urinary Tract: Normal adrenal glands. Nonobstructing 7 mm stone in the upper left kidney. No hydronephrosis. There is symmetric renal excretion on delayed phase imaging. Homogeneous renal enhancement. Multiple low-density lesions in the left kidney, largest measuring 2.3 cm consistent with simple cyst. Many of the additional lesions are too small to accurately characterize. No evidence of focal solid lesion. Urinary bladder is minimally distended. No bladder wall thickening or stone. No ureteral stone. Stomach/Bowel: Bowel evaluation is limited in the absence of enteric contrast and paucity of intra-abdominal fat. Stomach is partially distended, unremarkable. Occasional fluid-filled loops of small bowel without wall thickening, perienteric fat stranding, or obstruction. Cecum is slightly high-riding in the right mid abdomen. Appendix is not confidently visualized on the current exam. Moderate stool burden in the ascending and transverse colon. Descending and sigmoid colon are decompressed, limiting detailed assessment. There is no definite wall thickening. Vascular/Lymphatic: Moderate aorto bi-iliac atherosclerosis. No aortic aneurysm. The aortic branch vessels are patent. Portal vein is patent. No acute vascular findings. No bulky abdominopelvic adenopathy. Reproductive: Prostate is unremarkable.  Other: No free air or free fluid.  No body wall hernia. Musculoskeletal: Degenerative change in the spine. Degenerative change in the right hip. Stable area of sclerosis involving the right iliac bone, stable from 2019, likely benign. IMPRESSION: 1. Nonobstructing left renal stone. No hydronephrosis or obstructive uropathy. 2. Decompressed descending and sigmoid colon, limiting detailed assessment. No definite wall thickening or inflammatory change. 3. Borderline hepatic steatosis. Aortic Atherosclerosis (ICD10-I70.0). Electronically Signed   By: Keith Rake M.D.   On: 06/03/2020 17:29    ____________________________________________    PROCEDURES  Procedure(s) performed:    Procedures    Medications  potassium chloride 10 mEq in 100 mL IVPB (10 mEq Intravenous New Bag/Given 06/03/20 1723)  sodium chloride 0.9 % bolus 1,000 mL (1,000 mLs Intravenous New Bag/Given 06/03/20 1640)  potassium chloride SA (KLOR-CON) CR tablet 40 mEq (40 mEq Oral Given 06/03/20 1645)  morphine 4 MG/ML injection 4 mg (4 mg Intravenous Given 06/03/20 1642)  ondansetron (ZOFRAN) injection 4 mg (4 mg Intravenous Given 06/03/20 1641)  iohexol (OMNIPAQUE) 300 MG/ML solution 75 mL (75 mLs Intravenous Contrast Given 06/03/20 1705)     ____________________________________________   INITIAL IMPRESSION / ASSESSMENT AND PLAN / ED COURSE  Pertinent labs & imaging results that were available during my care of the patient were reviewed by me and considered in my medical decision making (see chart for details).  Review of the [place] CSRS was performed in accordance of the [place] prior to dispensing any controlled drugs.  Clinical Course as of Jun 03 1805  Mon Jun 03, 2020  1626 Patient presented to emergency department complaining of left flank pain, hematuria x3 days.  Symptoms began with hematuria without pain.  Patient is now experiencing sharp left flank pain into the left lower quadrant with ongoing hematuria.  Patient  had somewhat dark stools this morning as well.  No frank blood identified in his stools.  Based off the patient's presentation, physical exam a most concerned at this time for nephrolithiasis.  However given the patient's ischemic enteritis with a report of darkened stools this morning I  will do a CT scan with IV contrast.  Patient did have a low potassium incidentally on labs and will receive IV fluids, IV potassium and oral potassium while he is here.   [JC]    Clinical Course User Index [JC] Aceyn Kathol, Charline Bills, PA-C          Patient's diagnosis is consistent with nephrolithiasis.  Patient presented to emergency department complaining of left flank pain, hematuria x3 days.  No history of nephrolithiasis.  Patient had complained of dark stool, darker than normal but no rectal bleeding.  Patient had CT scan which found 7 mm stone in the left kidney.  No hydronephrosis.  At this time I feel this is likely cause of symptoms.  At this time I recommend follow-up with urology.  Patient replaced on Flomax, pain medication, drink plenty of fluids.  Incidentally patient's potassium was low here in the emergency department.  Patient was given IV and oral replacement and will be sent home with 5 days of potassium supplementation.  Patient stable for discharge at this time.  Follow-up primary care and urology.  Patient is given ED precautions to return to the ED for any worsening or new symptoms.     ____________________________________________  FINAL CLINICAL IMPRESSION(S) / ED DIAGNOSES  Final diagnoses:  Nephrolithiasis      NEW MEDICATIONS STARTED DURING THIS VISIT:  ED Discharge Orders         Ordered    oxyCODONE-acetaminophen (PERCOCET/ROXICET) 5-325 MG tablet  Every 6 hours PRN        06/03/20 1804    potassium chloride (KLOR-CON) 10 MEQ tablet  Daily        06/03/20 1804    tamsulosin (FLOMAX) 0.4 MG CAPS capsule  Daily        06/03/20 1804              This chart was  dictated using voice recognition software/Dragon. Despite best efforts to proofread, errors can occur which can change the meaning. Any change was purely unintentional.    Darletta Moll, PA-C 06/03/20 1806    Lavonia Drafts, MD 06/03/20 [phone]

## 2020-06-03 NOTE — Telephone Encounter (Signed)
Isaiah Rangel - Client TELEPHONE ADVICE RECORD [facility] Patient Name: Isaiah Rangel Gender: Male DOB: [birth year] Age: 59 Y 32 M 29 D Return Phone Number: [phone] (Primary), [phone] (Alternate) Address: City/State/Zip: [place] [address] Client Langston Rangel - Client Client Site [facility] Physician [name], [name] - MD Contact Type Call Who Is Calling Patient / Member / Family / Caregiver Call Type Triage / Clinical Caller Name Spencer Relationship To Patient Spouse Return Phone Number [phone] (Primary) Chief Complaint Flank Pain Reason for Call Symptomatic / Request for [place] states her husband is having pitting edema and flank pain. Caller reports he also had profuse sweating with nausea and vomiting last night. Caller states he has had a lot of blood in his urine. Additional Comment Caller transferred from the office for triage. [place] Not Listed [facility] Translation No Nurse Assessment Nurse: Zorita Pang, RN, Deborah Date/Time (Eastern Time): 06/03/2020 9:21:30 AM Confirm and document reason for call. If symptomatic, describe symptoms. ---The caller states that her husband is having left flank pain, hematuria with urination. Held his daily ASA for 2 days. Had nausea and vomiting last night. Does the patient have any new or worsening symptoms? ---Yes Will a triage be completed? ---Yes Related visit to physician within the last 2 weeks? ---No Does the PT have any chronic conditions? (i.e. diabetes, asthma, this includes High risk factors for pregnancy, etc.) ---Yes List chronic conditions. ---MI one year ago, angina rarely, COPD, lower back pain, weight loss without trying Is this a behavioral health or substance abuse call? ---No Guidelines Guideline Title Affirmed Question Affirmed Notes Nurse Date/Time (Eastern Time) Flank Pain [1]  Sudden onset of severe flank pain AND [2] age > 35 years Zorita Pang, Investment banker, operational 06/03/2020 9:23:45 AM Disp. Time Eilene Ghazi Time) Disposition Final User 06/03/2020 9:26:23 AM Go to ED Now Yes Zorita Pang, RN, Neoma Laming PLEASE NOTE: All timestamps contained within this report are represented as [place] Standard Time. CONFIDENTIALTY NOTICE: This fax transmission is intended only for the addressee. It contains information that is legally privileged, confidential or otherwise protected from use or disclosure. If you are not the intended recipient, you are strictly prohibited from reviewing, disclosing, copying using or disseminating any of this information or taking any action in reliance on or regarding this information. If you have received this fax in error, please notify us immediately by telephone so that we can arrange for its return to [place]. Phone: [phone], Toll-Free: [phone], Fax: [phone] Page: 2 of 2 Call Id: [id number] [place] Disagree/Comply Comply Caller Understands Yes PreDisposition Go to ED Care Advice Given Per Guideline GO TO ED NOW: * You need to be seen in the Emergency Department. Referrals GO TO FACILITY OTHER - SPECIFY

## 2020-06-04 NOTE — Progress Notes (Signed)
06/05/2020 8:38 AM   [name] 09-12-1960 [id number]  Referring provider: Owens Loffler, MD [address] [place],  [place] [address] Chief Complaint  Patient presents with  . New Patient (Initial Visit)    Kidney Stones    HPI: Isaiah Rangel is a 59 y.o. male who presents today for evaluation and management of kidney stone. The patient is accompanied by his wife today.   The patient was presented to the ED with gross hematuria and rectal bleeding on 06/03/2020. He had right flank pain and left lower quadrant abdominal pain. He had gross hematuria x 3 days which later resolved. He denied a history of nephrolithiasis and hematuria. He had some melena which was atypical. He has no history of a GI bleed.    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  He stated that the pain, location is not similar to his ischemic enteritis.  Patient denied seeing any actual blood in the stool.  Patient does have a history of chronic back pain due to an injury in his teen years.  He stated that the pain was in a different location and different type than his back pain.  No bowel or bladder dysfunction, saddle anesthesia or paresthesias.  UA showed gross hematuria, 21-50 RBC, no WBC and no bacteria. No culture was sent.  CT A/P w/ contrast noted a 7 mm "nonobstructing" stone in the upper left kidney. No hydronephrosis or obstructive uropathy per radiologist. Multiple low-density lesions in the left kidney, largest measuring 2.3 cm consistent with simple cyst. Many of the additional lesions are too small to accurately characterize. No evidence of focal solid lesion. No ureteral stone.    Patient was discharged on percocet, potassium chloride and flomax.   He has persistent flank and abdominal pain. For pain his is taking percocet. No fevers, chills, nausea or vomiting. No hematuria or dysuria. His aspirin was held on Monday and Tuesday and he restarted it yesterday.    He has not started Flomax. His wife reports that in the past his took Flomax and it significantly dropped his blood pressure.   Denies history of kidney stones.    PMH: Past Medical History:  Diagnosis Date  . Allergy   . Anxiety   . BPH (benign prostatic hyperplasia)   . COPD (chronic obstructive pulmonary disease) ([place])   . Coronary vasospasm ([place])    a. 03/2019 Cath: RCA 110m but only 40% after intracoronary ntg-->Imdur started.  Marland Kitchen History of echocardiogram    a. 03/2019 Echo: EF 50-55%, nl RV size/fxn. Trace MR/TR.  . Non-obstructive CAD (coronary artery disease)    a. 03/2013 Cath: min irregs. EF >55%; b. 03/2019 Cath: LM nl, LAD nl, LCX nl, OM1 40, RCA 30p, 32m-->40% after IC NTG.  . OA (osteoarthritis)   . Tobacco abuse     Surgical History: Past Surgical History:  Procedure Laterality Date  . BACK SURGERY    . CARDIAC CATHETERIZATION  04/06/13   [facility]- minor luminal irregularities, otherwise normal cors; EF > 55%. Medical management and smoking cessation recommended  . KNEE ARTHROSCOPY W/ PARTIAL MEDIAL MENISCECTOMY Right 2012   [place], 2012  . LEFT HEART CATH AND CORONARY ANGIOGRAPHY N/A 03/20/2019   Procedure: LEFT HEART CATH AND CORONARY ANGIOGRAPHY;  Surgeon: Troy Sine, MD;  Location: [facility];  Service: Cardiovascular;  Laterality: N/A;  . NASAL SINUS SURGERY    . neck fusion    . VASECTOMY  Home Medications:  Allergies as of 06/05/2020      Reactions   Other Nausea And Vomiting, Other (See Comments)   Quail eggs caused severe stomach pain and n/v      Medication List       Accurate as of June 05, 2020 11:59 PM. If you have any questions, ask your nurse or doctor.        albuterol 108 (90 Base) MCG/ACT inhaler Commonly known as: VENTOLIN HFA Inhale 2 puffs into the lungs every 6 (six) hours as needed for wheezing or shortness of breath.   amLODipine 2.5 MG tablet Commonly known as: NORVASC TAKE 1 TABLET BY MOUTH  DAILY   aspirin  EC 81 MG tablet Take 81 mg by mouth daily.   atorvastatin 80 MG tablet Commonly known as: LIPITOR Take 1 tablet (80 mg total) by mouth daily at 6 PM.   budesonide-formoterol 160-4.5 MCG/ACT inhaler Commonly known as: SYMBICORT Inhale 2 puffs into the lungs 2 (two) times daily.   escitalopram 10 MG tablet Commonly known as: LEXAPRO TAKE 1 TABLET BY MOUTH  DAILY   ezetimibe 10 MG tablet Commonly known as: ZETIA TAKE 1 TABLET BY MOUTH  DAILY   Fish Oil 1000 MG Caps Take 1,000 mg by mouth daily.   fludrocortisone 0.1 MG tablet Commonly known as: FLORINEF TAKE 2 TABLETS BY MOUTH  DAILY   isosorbide mononitrate 60 MG 24 hr tablet Commonly known as: IMDUR Take 0.5 tablets (30 mg total) by mouth at bedtime.   isosorbide mononitrate 30 MG 24 hr tablet Commonly known as: IMDUR TAKE 1 TABLET BY MOUTH  DAILY   multivitamin with minerals Tabs tablet Take 1 tablet by mouth daily.   nicotine 21 mg/24hr patch Commonly known as: NICODERM CQ - dosed in mg/24 hours Place 1 patch (21 mg total) onto the skin daily.   nicotine polacrilex 2 MG gum Commonly known as: NICORETTE Take 1 each (2 mg total) by mouth every 4 (four) hours while awake.   nitroGLYCERIN 0.4 MG SL tablet Commonly known as: NITROSTAT DISSOLVE 1 TABLET UNDER THE TONGUE EVERY 5 MINUTES AS NEEDED FOR CHEST PAIN   oxyCODONE-acetaminophen 5-325 MG tablet Commonly known as: PERCOCET/ROXICET Take 1 tablet by mouth every 6 (six) hours as needed for severe pain.   potassium chloride 10 MEQ tablet Commonly known as: KLOR-CON Take 1 tablet (10 mEq total) by mouth daily.   tamsulosin 0.4 MG Caps capsule Commonly known as: FLOMAX Take 1 capsule (0.4 mg total) by mouth daily.       Allergies:  Allergies  Allergen Reactions  . Other Nausea And Vomiting and Other (See Comments)    Quail eggs caused severe stomach pain and n/v    Family History: Family History  Problem Relation Age of Onset  . Arthritis Father    . Coronary artery disease Father   . Hypertension Father   . CVA Father 36  . Colon cancer Mother   . Heart attack Brother   . Heart attack Brother   . Coronary artery disease Brother   . Esophageal cancer Neg Hx   . Stomach cancer Neg Hx   . Rectal cancer Neg Hx     Social History:  reports that he has been smoking cigarettes. He has a 30.00 pack-year smoking history. He has never used smokeless tobacco. He reports that he does not drink alcohol and does not use drugs.   Physical Exam: BP 117/78   Pulse 76   Ht 5'  5" (1.651 m)   Wt 134 lb 9.6 oz (61.1 kg)   BMI 22.40 kg/m   Constitutional:  Alert and oriented, No acute distress.  Accompanied by wife, appears uncomfortable HEENT: [place] AT, moist mucus membranes.  Trachea midline, no masses. Cardiovascular: No clubbing, cyanosis, or edema. Respiratory: Normal respiratory effort, no increased work of breathing. Skin: No rashes, bruises or suspicious lesions. Neurologic: Grossly intact, no focal deficits, moving all 4 extremities. Psychiatric: Normal mood and affect.  Laboratory Data:  Lab Results  Component Value Date   CREATININE 1.02 06/13/2020    Urinalysis Results for orders placed or performed in visit on 06/05/20  CULTURE, URINE COMPREHENSIVE   Specimen: Urine   Urine  Result Value Ref Range   Urine Culture, Comprehensive Final report    Organism ID, Bacteria Comment   Microscopic Examination   Urine  Result Value Ref Range   WBC, UA 0-5 0 - 5 /hpf   RBC 11-30 (A) 0 - 2 /hpf   Epithelial Cells (non renal) 0-10 0 - 10 /hpf   Casts Present (A) None seen /lpf   Cast Type Granular casts (A) N/A   Crystals Present (A) N/A   Crystal Type Amorphous Sediment N/A   Bacteria, UA None seen None seen/Few  Urinalysis, Complete  Result Value Ref Range   Specific Gravity, UA 1.025 1.005 - 1.030   pH, UA 7.0 5.0 - 7.5   Color, UA Yellow Yellow   Appearance Ur Cloudy (A) Clear   Leukocytes,UA Negative Negative    Protein,UA 1+ (A) Negative/Trace   Glucose, UA Negative Negative   Ketones, UA Negative Negative   RBC, UA 1+ (A) Negative   Bilirubin, UA Negative Negative   Urobilinogen, Ur 0.2 0.2 - 1.0 mg/dL   Nitrite, UA Negative Negative   Microscopic Examination See below:      Pertinent Imaging: CLINICAL DATA:  Hematuria and left lower quadrant abdominal pain. Dark stools.  EXAM: CT ABDOMEN AND PELVIS WITH CONTRAST  TECHNIQUE: Multidetector CT imaging of the abdomen and pelvis was performed using the standard protocol following bolus administration of intravenous contrast.  CONTRAST:  86mL OMNIPAQUE IOHEXOL 300 MG/ML  SOLN  COMPARISON:  Abdominopelvic CT 11/26/2017  FINDINGS: Lower chest: Dependent opacities in both lower lobes typical of atelectasis. No pleural fluid. The heart is normal in size.  Hepatobiliary: Borderline hepatic steatosis with mild diffusely decreased hepatic density. No focal hepatic lesion. Gallbladder physiologically distended, no calcified stone. No biliary dilatation.  Pancreas: No ductal dilatation or inflammation.  Spleen: Normal in size without focal abnormality.  Adrenals/Urinary Tract: Normal adrenal glands. Nonobstructing 7 mm stone in the upper left kidney. No hydronephrosis. There is symmetric renal excretion on delayed phase imaging. Homogeneous renal enhancement. Multiple low-density lesions in the left kidney, largest measuring 2.3 cm consistent with simple cyst. Many of the additional lesions are too small to accurately characterize. No evidence of focal solid lesion. Urinary bladder is minimally distended. No bladder wall thickening or stone. No ureteral stone.  Stomach/Bowel: Bowel evaluation is limited in the absence of enteric contrast and paucity of intra-abdominal fat. Stomach is partially distended, unremarkable. Occasional fluid-filled loops of small bowel without wall thickening, perienteric fat stranding,  or obstruction. Cecum is slightly high-riding in the right mid abdomen. Appendix is not confidently visualized on the current exam. Moderate stool burden in the ascending and transverse colon. Descending and sigmoid colon are decompressed, limiting detailed assessment. There is no definite wall thickening.  Vascular/Lymphatic: Moderate aorto bi-iliac atherosclerosis.  No aortic aneurysm. The aortic branch vessels are patent. Portal vein is patent. No acute vascular findings. No bulky abdominopelvic adenopathy.  Reproductive: Prostate is unremarkable.  Other: No free air or free fluid.  No body wall hernia.  Musculoskeletal: Degenerative change in the spine. Degenerative change in the right hip. Stable area of sclerosis involving the right iliac bone, stable from 2019, likely benign.  IMPRESSION: 1. Nonobstructing left renal stone. No hydronephrosis or obstructive uropathy. 2. Decompressed descending and sigmoid colon, limiting detailed assessment. No definite wall thickening or inflammatory change. 3. Borderline hepatic steatosis.  Aortic Atherosclerosis (ICD10-I70.0).   Electronically Signed   By: Keith Rake M.D.   On: 06/03/2020 17:29  Stone appears to to be lodged in the upper pole infundibulum causing obstruction which is causing localized obstruction in left upper pole moiety with resulting caliectasis.      Assessment & Plan:    1. Mircoscopic hematuria UA today shows RBCs otherwise normal, likely secondary to #2  I did not suspect an infection.   -Urine culture sent.  2. Left renal calculus CT A/P w/ contrast noted a 7 mm stone in the upper left kidney- focal obstruction of upper pole calix  We discussed various treatment options including ESWL vs. ureteroscopy, laser lithotripsy, and stent. We discussed the risks and benefits of both including bleeding, infection, damage to surrounding structures, efficacy with need for possible further  intervention, and need for temporary ureteral stent.  Patient elected shock wave. Will need cardiology clearance to hold ASA.  Avoid NSAIDs 72 hours prior to procedure.  Narcotics can be used as needed.    Patient has a distant history of taking Flomax that significantly decreased his blood pressure. I do not recommend patient to restart Flomax.  Warning symptoms reviewed  3. Simple left renal cyst No further imaging or intervention needed.   [facility] [address] [place], [place] [address] [phone]  I, Selena Batten, am acting as a scribe for Dr. Hollice Espy.  I have reviewed the above documentation for accuracy and completeness, and I agree with the above.   Hollice Espy, MD

## 2020-06-05 ENCOUNTER — Other Ambulatory Visit: Payer: Self-pay

## 2020-06-05 ENCOUNTER — Ambulatory Visit (INDEPENDENT_AMBULATORY_CARE_PROVIDER_SITE_OTHER): Payer: 59 | Admitting: Urology

## 2020-06-05 ENCOUNTER — Telehealth: Payer: Self-pay

## 2020-06-05 VITALS — BP 117/78 | HR 76 | Ht 65.0 in | Wt 134.6 lb

## 2020-06-05 DIAGNOSIS — R3129 Other microscopic hematuria: Secondary | ICD-10-CM

## 2020-06-05 DIAGNOSIS — N200 Calculus of kidney: Secondary | ICD-10-CM | POA: Diagnosis not present

## 2020-06-05 LAB — URINALYSIS, COMPLETE
Bilirubin, UA: NEGATIVE
Glucose, UA: NEGATIVE
Ketones, UA: NEGATIVE
Leukocytes,UA: NEGATIVE
Nitrite, UA: NEGATIVE
Specific Gravity, UA: 1.025 (ref 1.005–1.030)
Urobilinogen, Ur: 0.2 mg/dL (ref 0.2–1.0)
pH, UA: 7 (ref 5.0–7.5)

## 2020-06-05 LAB — MICROSCOPIC EXAMINATION: Bacteria, UA: NONE SEEN

## 2020-06-05 MED ORDER — POTASSIUM CHLORIDE ER 10 MEQ PO TBCR: 10.0000 meq | EXTENDED_RELEASE_TABLET | Freq: Every day | ORAL | 0 refills | Status: DC

## 2020-06-05 MED ORDER — TAMSULOSIN HCL 0.4 MG PO CAPS: 0.4000 mg | ORAL_CAPSULE | Freq: Every day | ORAL | 0 refills | Status: DC

## 2020-06-05 NOTE — Telephone Encounter (Signed)
Mitzi left v/m that pt seen ED on 06/03/20 with kidney stone; pt has appt with urologist today; mitzi said ED was to send in 3 rx to [facility] [address] [place]. The oxycodone apap was filled at pharmacy but pharmacy did not receive the K+ and tamsulosin. I spoke with Wells Guiles at [facility] and she said they did not receive the rx for K+ and tamsulosin and pt would need to ck with ED. I spoke with Dr Lorelei Pont and he said OK to send in the K+10 meq #5 x 0 taking one daily and tamsulosin 0.4 mg # 14 x 0 taking one daily. Mitzi notified and voiced understanding and very appreciative. FYI to Dr Lorelei Pont.

## 2020-06-06 ENCOUNTER — Telehealth: Payer: Self-pay | Admitting: Cardiovascular Disease

## 2020-06-06 ENCOUNTER — Other Ambulatory Visit: Payer: Self-pay | Admitting: Radiology

## 2020-06-06 DIAGNOSIS — N200 Calculus of kidney: Secondary | ICD-10-CM

## 2020-06-06 NOTE — Telephone Encounter (Signed)
° °  [facility] Pre-operative Risk Assessment    HEARTCARE STAFF: - Please ensure there is not already an duplicate clearance open for this procedure. - Under Visit Info/Reason for Call, type in Other and utilize the format Clearance MM/DD/YY or Clearance TBD. Do not use dashes or single digits. - If request is for dental extraction, please clarify the # of teeth to be extracted.  Request for surgical clearance:  1. What type of surgery is being performed? Extracorporeal shockwave lithotripsy  2. When is this surgery scheduled? 06/13/20  3. What type of clearance is required (medical clearance vs. Pharmacy clearance to hold med vs. Both)? pharm  4. Are there any medications that need to be held prior to surgery and how long? Hold aspirin 3 days prior  5. Practice name and name of physician performing surgery? [facility]  6. What is the office phone number? [phone]   7.   What is the office fax number? [phone]  8.   Anesthesia type (None, local, MAC, general) ? Not noted   Isaiah Rangel 06/06/2020, 12:01 PM  _________________________________________________________________   (provider comments below)

## 2020-06-06 NOTE — Telephone Encounter (Signed)
Isaiah Rangel 59 year old male is requesting lithotripsy procedure.  He was last seen in the clinic on 02/07/2020.  During that time his EKG showed normal sinus rhythm.  Nuclear stress test 5/21 showed low risk and he denied chest pressure.  He showed improvement with his orthostatic hypotension.  May his aspirin be held prior to his procedure?  His PMH includes, orthostatic hypotension, coronary artery disease, hyperlipidemia, tobacco use, and COPD.  Please direct response to [facility] preop pool.  Thank you for your help.  Jossie Ng. Zeev Deakins NP-C    06/06/2020, 12:59 PM [address] Office [phone] Fax [phone]

## 2020-06-08 LAB — CULTURE, URINE COMPREHENSIVE

## 2020-06-09 NOTE — Telephone Encounter (Signed)
Ok to hold asa 3 days, even longer if needed Thx TG

## 2020-06-10 ENCOUNTER — Other Ambulatory Visit: Payer: Self-pay

## 2020-06-10 ENCOUNTER — Encounter: Payer: Self-pay | Admitting: Physician Assistant

## 2020-06-10 ENCOUNTER — Telehealth: Payer: Self-pay

## 2020-06-10 ENCOUNTER — Ambulatory Visit (INDEPENDENT_AMBULATORY_CARE_PROVIDER_SITE_OTHER): Payer: 59 | Admitting: Physician Assistant

## 2020-06-10 VITALS — BP 148/82 | HR 71 | Temp 97.9°F | Ht 65.0 in | Wt 130.0 lb

## 2020-06-10 DIAGNOSIS — N200 Calculus of kidney: Secondary | ICD-10-CM | POA: Diagnosis not present

## 2020-06-10 MED ORDER — PROMETHAZINE HCL 25 MG/ML IJ SOLN: 25.0000 mg | Freq: Once | INTRAMUSCULAR | Status: DC

## 2020-06-10 MED ORDER — PROMETHAZINE HCL 25 MG/ML IJ SOLN
25.0000 mg | Freq: Once | INTRAMUSCULAR | Status: AC
  Administered 2020-06-10: 25 mg via INTRAMUSCULAR

## 2020-06-10 MED ORDER — ONDANSETRON HCL 4 MG PO TABS: 4.0000 mg | ORAL_TABLET | Freq: Three times a day (TID) | ORAL | 0 refills | Status: DC | PRN

## 2020-06-10 MED ORDER — KETOROLAC TROMETHAMINE 60 MG/2ML IM SOLN
60.0000 mg | Freq: Once | INTRAMUSCULAR | Status: AC
  Administered 2020-06-10: 60 mg via INTRAMUSCULAR

## 2020-06-10 NOTE — Telephone Encounter (Signed)
° °  Primary Cardiologist: Ida Rogue, MD  Chart reviewed as part of pre-operative protocol coverage. Given past medical history and time since last visit, based on ACC/AHA guidelines, Isaiah Rangel would be at acceptable risk for the planned procedure without further cardiovascular testing.   Isaiah Rangel may hold his aspirin for 3 days prior to the procedure.  Please resume as soon as hemostasis is achieved.  Patient was advised that if Isaiah Rangel develops new symptoms prior to surgery to contact our office to arrange a follow-up appointment.  Isaiah Rangel verbalized understanding.  I will route this recommendation to the requesting party via Epic fax function and remove from pre-op pool.  Please call with questions.  Jossie Ng. Cambell Rickenbach NP-C    06/10/2020, 8:47 AM [place] [address] Office [phone] Fax [phone]

## 2020-06-10 NOTE — Telephone Encounter (Signed)
Incoming call on triage line from patients wife reporting patient is in severe pain, notes 10 out of 10, includes flank, back and testicle pain. Patients wife denies fever, chills and vomitting. She also notes patient has taken 1 hydrocodone and 1 percocet with no relief. Offered patient to come in for a toradol shot, otherwise he would have to go to the ER. Patient chose office visit over ER.

## 2020-06-10 NOTE — Progress Notes (Signed)
IM Injection  Patient is present today for an IM Injection for treatment of  Flank pain  Drug: Ketorolac 60mg  Dose:4ml Location:Right upper outer buttocks Lot: [id number] Exp:07/2021 Patient tolerated well, no complications were noted  Preformed by: Fonnie Jarvis, CMA  IM Injection  Drug: Promethazine 25mg  Dose:47ml Location:Left upper outer buttocks Lot: [id number] Exp:09/29/2020 Patient tolerated well, no complications were noted  Preformed by: Alva Garnet

## 2020-06-10 NOTE — Progress Notes (Signed)
06/10/2020 4:49 PM   Isaiah Rangel 15-Jul-1961 [id number]  CC: Chief Complaint  Patient presents with  . Nephrolithiasis    flank pain   HPI: Isaiah Rangel is a 59 y.o. male with a history of left flank pain scheduled to undergo ESWL with Dr. Erlene Quan in 3 days for management of a 7 mm left upper pole stone who presents today for evaluation of left flank pain.  Today he reports sudden onset of severe left flank and testicular pain and nausea/vomiting around 12 PM today.  He took 1 hydrocodone and 1 Percocet at home which "did not touch the pain."  He denies a history of GI bleeds, PUD, or stomach surgery.  He denies a history of renal disease.  He states no one has told him to avoid NSAIDs.  In-office UA today positive for trace ketones, 3+ blood, and 2+ protein; urine microscopy with >30 RBCs/HPF and granular casts.  PMH: Past Medical History:  Diagnosis Date  . Allergy   . Anxiety   . BPH (benign prostatic hyperplasia)   . COPD (chronic obstructive pulmonary disease) ([place])   . Coronary vasospasm ([place])    a. 03/2019 Cath: RCA 75m but only 40% after intracoronary ntg-->Imdur started.  Marland Kitchen History of echocardiogram    a. 03/2019 Echo: EF 50-55%, nl RV size/fxn. Trace MR/TR.  . Non-obstructive CAD (coronary artery disease)    a. 03/2013 Cath: min irregs. EF >55%; b. 03/2019 Cath: LM nl, LAD nl, LCX nl, OM1 40, RCA 30p, 5m-->40% after IC NTG.  . OA (osteoarthritis)   . Tobacco abuse     Surgical History: Past Surgical History:  Procedure Laterality Date  . BACK SURGERY    . CARDIAC CATHETERIZATION  04/06/13   [facility]- minor luminal irregularities, otherwise normal cors; EF > 55%. Medical management and smoking cessation recommended  . KNEE ARTHROSCOPY W/ PARTIAL MEDIAL MENISCECTOMY Right 2012   [place], 2012  . LEFT HEART CATH AND CORONARY ANGIOGRAPHY N/A 03/20/2019   Procedure: LEFT HEART CATH AND CORONARY ANGIOGRAPHY;  Surgeon: Troy Sine, MD;  Location: [facility];   Service: Cardiovascular;  Laterality: N/A;  . NASAL SINUS SURGERY    . neck fusion    . VASECTOMY      Home Medications:  Allergies as of 06/10/2020      Reactions   Other Nausea And Vomiting, Other (See Comments)   Quail eggs caused severe stomach pain and n/v      Medication List       Accurate as of June 10, 2020  4:49 PM. If you have any questions, ask your nurse or doctor.        albuterol 108 (90 Base) MCG/ACT inhaler Commonly known as: VENTOLIN HFA Inhale 2 puffs into the lungs every 6 (six) hours as needed for wheezing or shortness of breath.   amLODipine 2.5 MG tablet Commonly known as: NORVASC TAKE 1 TABLET BY MOUTH  DAILY   aspirin EC 81 MG tablet Take 81 mg by mouth daily.   atorvastatin 80 MG tablet Commonly known as: LIPITOR Take 1 tablet (80 mg total) by mouth daily at 6 PM.   budesonide-formoterol 160-4.5 MCG/ACT inhaler Commonly known as: SYMBICORT Inhale 2 puffs into the lungs 2 (two) times daily.   escitalopram 10 MG tablet Commonly known as: LEXAPRO TAKE 1 TABLET BY MOUTH  DAILY   ezetimibe 10 MG tablet Commonly known as: ZETIA TAKE 1 TABLET BY MOUTH  DAILY   Fish Oil  1000 MG Caps Take 1,000 mg by mouth daily.   fludrocortisone 0.1 MG tablet Commonly known as: FLORINEF TAKE 2 TABLETS BY MOUTH  DAILY   isosorbide mononitrate 60 MG 24 hr tablet Commonly known as: IMDUR Take 0.5 tablets (30 mg total) by mouth at bedtime.   isosorbide mononitrate 30 MG 24 hr tablet Commonly known as: IMDUR TAKE 1 TABLET BY MOUTH  DAILY   multivitamin with minerals Tabs tablet Take 1 tablet by mouth daily.   nicotine 21 mg/24hr patch Commonly known as: NICODERM CQ - dosed in mg/24 hours Place 1 patch (21 mg total) onto the skin daily.   nicotine polacrilex 2 MG gum Commonly known as: NICORETTE Take 1 each (2 mg total) by mouth every 4 (four) hours while awake.   nitroGLYCERIN 0.4 MG SL tablet Commonly known as: NITROSTAT DISSOLVE 1 TABLET  UNDER THE TONGUE EVERY 5 MINUTES AS NEEDED FOR CHEST PAIN   ondansetron 4 MG tablet Commonly known as: Zofran Take 1 tablet (4 mg total) by mouth every 8 (eight) hours as needed. Started by: Debroah Loop, PA-C   oxyCODONE-acetaminophen 5-325 MG tablet Commonly known as: PERCOCET/ROXICET Take 1 tablet by mouth every 6 (six) hours as needed for severe pain.   potassium chloride 10 MEQ tablet Commonly known as: KLOR-CON Take 1 tablet (10 mEq total) by mouth daily.   tamsulosin 0.4 MG Caps capsule Commonly known as: FLOMAX Take 1 capsule (0.4 mg total) by mouth daily.       Allergies:  Allergies  Allergen Reactions  . Other Nausea And Vomiting and Other (See Comments)    Quail eggs caused severe stomach pain and n/v    Family History: Family History  Problem Relation Age of Onset  . Arthritis Father   . Coronary artery disease Father   . Hypertension Father   . CVA Father 68  . Colon cancer Mother   . Heart attack Brother   . Heart attack Brother   . Coronary artery disease Brother   . Esophageal cancer Neg Hx   . Stomach cancer Neg Hx   . Rectal cancer Neg Hx     Social History:   reports that he has been smoking cigarettes. He has a 30.00 pack-year smoking history. He has never used smokeless tobacco. He reports that he does not drink alcohol and does not use drugs.  Physical Exam: BP (!) 148/82   Pulse 71   Temp 97.9 F (36.6 C)   Ht 5\' 5"  (1.651 m)   Wt 130 lb (59 kg)   BMI 21.63 kg/m   Constitutional:  Alert and oriented, doubled over in wheelchair, diaphoretic, uncomfortable appearing, vomiting HEENT: [place], AT Cardiovascular: No clubbing, cyanosis, or edema Respiratory: Normal respiratory effort, no increased work of breathing Skin: No rashes, bruises or suspicious lesions Neurologic: Grossly intact, no focal deficits, moving all 4 extremities Psychiatric: Normal mood and affect  Laboratory Data: Results for orders placed or performed in  visit on 06/10/20  Microscopic Examination   Urine  Result Value Ref Range   WBC, UA 0-5 0 - 5 /hpf   RBC >30 (A) 0 - 2 /hpf   Epithelial Cells (non renal) 0-10 0 - 10 /hpf   Casts Present (A) None seen /lpf   Cast Type Granular casts (A) N/A   Bacteria, UA Few None seen/Few  Urinalysis, Complete  Result Value Ref Range   Specific Gravity, UA 1.025 1.005 - 1.030   pH, UA 7.0 5.0 - 7.5  Color, UA Brown (A) Yellow   Appearance Ur Cloudy (A) Clear   Leukocytes,UA Negative Negative   Protein,UA 2+ (A) Negative/Trace   Glucose, UA Negative Negative   Ketones, UA Trace (A) Negative   RBC, UA 3+ (A) Negative   Bilirubin, UA Negative Negative   Urobilinogen, Ur 0.2 0.2 - 1.0 mg/dL   Nitrite, UA Negative Negative   Microscopic Examination See below:    Assessment & Plan:   1. Kidney stones Sudden onset left renal colic 3 days in advance of scheduled ESWL.  Afebrile, VSS, UA with microscopic hematuria but low concern for infection today.  Toradol and Phenergan injections administered in clinic today; patient noted significant improvement in his symptoms on these medications.  Zofran prescription sent to his pharmacy.  I explained to the patient that I will not be able to administer Toradol after today in advance of his scheduled procedure in 3 days.  I explained that if his pain returns and is uncontrollable with prescribed Percocet, he should proceed to the emergency department for further assistance.  He expressed understanding. - Urinalysis, Complete - ondansetron (ZOFRAN) 4 MG tablet; Take 1 tablet (4 mg total) by mouth every 8 (eight) hours as needed.  Dispense: 20 tablet; Refill: 0 - ketorolac (TORADOL) injection 60 mg - promethazine (PHENERGAN) injection 25 mg  Return in about 3 days (around 06/13/2020) for As scheduled for ESWL.  Debroah Loop, PA-C  [facility] [address] [place], [place] [address] [phone]

## 2020-06-11 ENCOUNTER — Other Ambulatory Visit: Payer: Self-pay

## 2020-06-11 ENCOUNTER — Other Ambulatory Visit
Admission: RE | Admit: 2020-06-11 | Discharge: 2020-06-11 | Disposition: A | Discharge status: Home / Self Care | Payer: 59 | Source: Ambulatory Visit | Attending: Urology | Admitting: Urology

## 2020-06-11 ENCOUNTER — Observation Stay
Admission: EM | Admit: 2020-06-11 | Discharge: 2020-06-13 | Disposition: A | Discharge status: Home / Self Care | Payer: 59 | Attending: Family Medicine | Admitting: Family Medicine

## 2020-06-11 ENCOUNTER — Emergency Department: Payer: 59

## 2020-06-11 DIAGNOSIS — N200 Calculus of kidney: Principal | ICD-10-CM

## 2020-06-11 DIAGNOSIS — Z01812 Encounter for preprocedural laboratory examination: Secondary | ICD-10-CM | POA: Insufficient documentation

## 2020-06-11 DIAGNOSIS — Z79899 Other long term (current) drug therapy: Secondary | ICD-10-CM | POA: Insufficient documentation

## 2020-06-11 DIAGNOSIS — J4489 Other specified chronic obstructive pulmonary disease: Secondary | ICD-10-CM | POA: Diagnosis present

## 2020-06-11 DIAGNOSIS — F17210 Nicotine dependence, cigarettes, uncomplicated: Secondary | ICD-10-CM | POA: Diagnosis not present

## 2020-06-11 DIAGNOSIS — R1032 Left lower quadrant pain: Secondary | ICD-10-CM | POA: Diagnosis present

## 2020-06-11 DIAGNOSIS — R112 Nausea with vomiting, unspecified: Secondary | ICD-10-CM | POA: Diagnosis present

## 2020-06-11 DIAGNOSIS — Z7982 Long term (current) use of aspirin: Secondary | ICD-10-CM | POA: Insufficient documentation

## 2020-06-11 DIAGNOSIS — Z23 Encounter for immunization: Secondary | ICD-10-CM | POA: Insufficient documentation

## 2020-06-11 DIAGNOSIS — F32A Depression, unspecified: Secondary | ICD-10-CM | POA: Diagnosis present

## 2020-06-11 DIAGNOSIS — D72829 Elevated white blood cell count, unspecified: Secondary | ICD-10-CM | POA: Diagnosis present

## 2020-06-11 DIAGNOSIS — F17200 Nicotine dependence, unspecified, uncomplicated: Secondary | ICD-10-CM

## 2020-06-11 DIAGNOSIS — I951 Orthostatic hypotension: Secondary | ICD-10-CM | POA: Diagnosis not present

## 2020-06-11 DIAGNOSIS — Z20822 Contact with and (suspected) exposure to covid-19: Secondary | ICD-10-CM | POA: Insufficient documentation

## 2020-06-11 DIAGNOSIS — I2510 Atherosclerotic heart disease of native coronary artery without angina pectoris: Secondary | ICD-10-CM | POA: Diagnosis not present

## 2020-06-11 DIAGNOSIS — N179 Acute kidney failure, unspecified: Secondary | ICD-10-CM | POA: Diagnosis present

## 2020-06-11 DIAGNOSIS — IMO0001 Reserved for inherently not codable concepts without codable children: Secondary | ICD-10-CM | POA: Diagnosis present

## 2020-06-11 DIAGNOSIS — K5903 Drug induced constipation: Secondary | ICD-10-CM | POA: Diagnosis not present

## 2020-06-11 DIAGNOSIS — E782 Mixed hyperlipidemia: Secondary | ICD-10-CM | POA: Diagnosis present

## 2020-06-11 DIAGNOSIS — I679 Cerebrovascular disease, unspecified: Secondary | ICD-10-CM | POA: Diagnosis present

## 2020-06-11 DIAGNOSIS — J449 Chronic obstructive pulmonary disease, unspecified: Secondary | ICD-10-CM | POA: Diagnosis not present

## 2020-06-11 DIAGNOSIS — R1110 Vomiting, unspecified: Secondary | ICD-10-CM | POA: Diagnosis present

## 2020-06-11 LAB — CBC
HCT: 34.8 % — ABNORMAL LOW (ref 39.0–52.0)
Hemoglobin: 11.9 g/dL — ABNORMAL LOW (ref 13.0–17.0)
MCH: 31.8 pg (ref 26.0–34.0)
MCHC: 34.2 g/dL (ref 30.0–36.0)
MCV: 93 fL (ref 80.0–100.0)
Platelets: 178 10*3/uL (ref 150–400)
RBC: 3.74 MIL/uL — ABNORMAL LOW (ref 4.22–5.81)
RDW: 16.8 % — ABNORMAL HIGH (ref 11.5–15.5)
WBC: 15.8 10*3/uL — ABNORMAL HIGH (ref 4.0–10.5)
nRBC: 0 % (ref 0.0–0.2)

## 2020-06-11 LAB — RESPIRATORY PANEL BY RT PCR (FLU A&B, COVID)
Influenza A by PCR: NEGATIVE
Influenza B by PCR: NEGATIVE
SARS Coronavirus 2 by RT PCR: NEGATIVE

## 2020-06-11 LAB — BRAIN NATRIURETIC PEPTIDE: B Natriuretic Peptide: 56.2 pg/mL (ref 0.0–100.0)

## 2020-06-11 LAB — BASIC METABOLIC PANEL
Anion gap: 11 (ref 5–15)
BUN: 18 mg/dL (ref 6–20)
CO2: 27 mmol/L (ref 22–32)
Calcium: 9.5 mg/dL (ref 8.9–10.3)
Chloride: 106 mmol/L (ref 98–111)
Creatinine, Ser: 1.25 mg/dL — ABNORMAL HIGH (ref 0.61–1.24)
GFR, Estimated: >60 mL/min (ref >60)
Glucose, Bld: 136 mg/dL — ABNORMAL HIGH (ref 70–99)
Potassium: 3.5 mmol/L (ref 3.5–5.1)
Sodium: 144 mmol/L (ref 135–145)

## 2020-06-11 LAB — URINALYSIS, COMPLETE (UACMP) WITH MICROSCOPIC
Bacteria, UA: NONE SEEN
Bilirubin Urine: NEGATIVE
Glucose, UA: NEGATIVE mg/dL
Ketones, ur: 5 mg/dL — AB
Leukocytes,Ua: NEGATIVE
Nitrite: NEGATIVE
Protein, ur: NEGATIVE mg/dL
Specific Gravity, Urine: 1.01 (ref 1.005–1.030)
pH: 8 (ref 5.0–8.0)

## 2020-06-11 LAB — CBC WITH DIFFERENTIAL/PLATELET
Abs Immature Granulocytes: 0.09 10*3/uL — ABNORMAL HIGH (ref 0.00–0.07)
Basophils Absolute: 0.1 10*3/uL (ref 0.0–0.1)
Basophils Relative: 1 %
Eosinophils Absolute: 0.1 10*3/uL (ref 0.0–0.5)
Eosinophils Relative: 1 %
HCT: 40.4 % (ref 39.0–52.0)
Hemoglobin: 13.8 g/dL (ref 13.0–17.0)
Immature Granulocytes: 1 %
Lymphocytes Relative: 15 %
Lymphs Abs: 2.6 10*3/uL (ref 0.7–4.0)
MCH: 31.8 pg (ref 26.0–34.0)
MCHC: 34.2 g/dL (ref 30.0–36.0)
MCV: 93.1 fL (ref 80.0–100.0)
Monocytes Absolute: 1.1 10*3/uL — ABNORMAL HIGH (ref 0.1–1.0)
Monocytes Relative: 6 %
Neutro Abs: 13.6 10*3/uL — ABNORMAL HIGH (ref 1.7–7.7)
Neutrophils Relative %: 76 %
Platelets: 237 10*3/uL (ref 150–400)
RBC: 4.34 MIL/uL (ref 4.22–5.81)
RDW: 17.1 % — ABNORMAL HIGH (ref 11.5–15.5)
WBC: 17.7 10*3/uL — ABNORMAL HIGH (ref 4.0–10.5)
nRBC: 0 % (ref 0.0–0.2)

## 2020-06-11 LAB — PROTIME-INR
INR: 1 (ref 0.8–1.2)
Prothrombin Time: 13.2 seconds (ref 11.4–15.2)

## 2020-06-11 LAB — URINALYSIS, COMPLETE
Bilirubin, UA: NEGATIVE
Glucose, UA: NEGATIVE
Leukocytes,UA: NEGATIVE
Nitrite, UA: NEGATIVE
Specific Gravity, UA: 1.025 (ref 1.005–1.030)
Urobilinogen, Ur: 0.2 mg/dL (ref 0.2–1.0)
pH, UA: 7 (ref 5.0–7.5)

## 2020-06-11 LAB — APTT: aPTT: 31 seconds (ref 24–36)

## 2020-06-11 LAB — MICROSCOPIC EXAMINATION: RBC, Urine: >30 /hpf — AB (ref 0–2)

## 2020-06-11 LAB — TYPE AND SCREEN
ABO/RH(D): A POS
Antibody Screen: NEGATIVE

## 2020-06-11 MED ORDER — KETOROLAC TROMETHAMINE 30 MG/ML IJ SOLN
30.0000 mg | Freq: Once | INTRAMUSCULAR | Status: AC
  Administered 2020-06-11: 30 mg via INTRAVENOUS
  Filled 2020-06-11: qty 1

## 2020-06-11 MED ORDER — EZETIMIBE 10 MG PO TABS
10.0000 mg | ORAL_TABLET | Freq: Every day | ORAL | Status: DC
  Administered 2020-06-12 – 2020-06-13 (×2): 10 mg via ORAL
  Filled 2020-06-11 (×3): qty 1

## 2020-06-11 MED ORDER — NICOTINE 21 MG/24HR TD PT24
21.0000 mg | MEDICATED_PATCH | Freq: Every day | TRANSDERMAL | Status: DC
  Filled 2020-06-11 (×2): qty 1

## 2020-06-11 MED ORDER — MORPHINE SULFATE (PF) 2 MG/ML IV SOLN
2.0000 mg | INTRAVENOUS | Status: DC | PRN
  Administered 2020-06-11 – 2020-06-13 (×3): 2 mg via INTRAVENOUS
  Filled 2020-06-11 (×3): qty 1

## 2020-06-11 MED ORDER — HYDROMORPHONE HCL 1 MG/ML IJ SOLN
1.0000 mg | Freq: Once | INTRAMUSCULAR | Status: AC
  Administered 2020-06-11: 1 mg via INTRAVENOUS
  Filled 2020-06-11: qty 1

## 2020-06-11 MED ORDER — ALBUTEROL SULFATE HFA 108 (90 BASE) MCG/ACT IN AERS
2.0000 | INHALATION_SPRAY | RESPIRATORY_TRACT | Status: DC | PRN
  Filled 2020-06-11: qty 6.7

## 2020-06-11 MED ORDER — AMLODIPINE BESYLATE 5 MG PO TABS: 2.5000 mg | ORAL_TABLET | Freq: Every day | ORAL | Status: DC

## 2020-06-11 MED ORDER — ACETAMINOPHEN 325 MG PO TABS: 650.0000 mg | ORAL_TABLET | Freq: Four times a day (QID) | ORAL | Status: DC | PRN

## 2020-06-11 MED ORDER — SODIUM CHLORIDE 0.9 % IV BOLUS
1000.0000 mL | Freq: Once | INTRAVENOUS | Status: AC
  Administered 2020-06-11: 1000 mL via INTRAVENOUS

## 2020-06-11 MED ORDER — HEPARIN SODIUM (PORCINE) 5000 UNIT/ML IJ SOLN: 5000.0000 [IU] | Freq: Three times a day (TID) | INTRAMUSCULAR | Status: DC

## 2020-06-11 MED ORDER — AMLODIPINE BESYLATE 5 MG PO TABS
2.5000 mg | ORAL_TABLET | Freq: Once | ORAL | Status: AC
  Administered 2020-06-11: 2.5 mg via ORAL
  Filled 2020-06-11: qty 1

## 2020-06-11 MED ORDER — OXYCODONE-ACETAMINOPHEN 5-325 MG PO TABS
1.0000 | ORAL_TABLET | ORAL | Status: DC | PRN
  Administered 2020-06-12 (×4): 1 via ORAL
  Filled 2020-06-11 (×5): qty 1

## 2020-06-11 MED ORDER — HYDRALAZINE HCL 20 MG/ML IJ SOLN: 5.0000 mg | INTRAMUSCULAR | Status: DC | PRN

## 2020-06-11 MED ORDER — AMLODIPINE BESYLATE 5 MG PO TABS
2.5000 mg | ORAL_TABLET | Freq: Every day | ORAL | Status: DC
  Administered 2020-06-12 – 2020-06-13 (×2): 2.5 mg via ORAL
  Filled 2020-06-11 (×2): qty 1

## 2020-06-11 MED ORDER — PANTOPRAZOLE SODIUM 40 MG IV SOLR
40.0000 mg | Freq: Two times a day (BID) | INTRAVENOUS | Status: DC
  Administered 2020-06-11 – 2020-06-13 (×4): 40 mg via INTRAVENOUS
  Filled 2020-06-11 (×4): qty 40

## 2020-06-11 MED ORDER — ATORVASTATIN CALCIUM 20 MG PO TABS
80.0000 mg | ORAL_TABLET | Freq: Every day | ORAL | Status: DC
  Administered 2020-06-11 – 2020-06-12 (×2): 80 mg via ORAL
  Filled 2020-06-11 (×2): qty 4

## 2020-06-11 MED ORDER — OMEGA-3-ACID ETHYL ESTERS 1 G PO CAPS
1.0000 g | ORAL_CAPSULE | Freq: Every day | ORAL | Status: DC
  Administered 2020-06-12 – 2020-06-13 (×2): 1 g via ORAL
  Filled 2020-06-11 (×3): qty 1

## 2020-06-11 MED ORDER — SENNOSIDES-DOCUSATE SODIUM 8.6-50 MG PO TABS: 1.0000 | ORAL_TABLET | Freq: Every evening | ORAL | Status: DC | PRN

## 2020-06-11 MED ORDER — ONDANSETRON HCL 4 MG/2ML IJ SOLN
4.0000 mg | Freq: Three times a day (TID) | INTRAMUSCULAR | Status: DC | PRN
  Administered 2020-06-11 – 2020-06-13 (×2): 4 mg via INTRAVENOUS
  Filled 2020-06-11 (×2): qty 2

## 2020-06-11 MED ORDER — MOMETASONE FURO-FORMOTEROL FUM 200-5 MCG/ACT IN AERO
2.0000 | INHALATION_SPRAY | Freq: Two times a day (BID) | RESPIRATORY_TRACT | Status: DC
  Administered 2020-06-12 (×2): 2 via RESPIRATORY_TRACT
  Filled 2020-06-11 (×3): qty 8.8

## 2020-06-11 MED ORDER — ASPIRIN EC 81 MG PO TBEC
81.0000 mg | DELAYED_RELEASE_TABLET | Freq: Every day | ORAL | Status: DC
  Administered 2020-06-11: 81 mg via ORAL
  Filled 2020-06-11 (×3): qty 1

## 2020-06-11 MED ORDER — ADULT MULTIVITAMIN W/MINERALS CH
1.0000 | ORAL_TABLET | Freq: Every day | ORAL | Status: DC
  Administered 2020-06-11 – 2020-06-13 (×3): 1 via ORAL
  Filled 2020-06-11 (×3): qty 1

## 2020-06-11 MED ORDER — ISOSORBIDE MONONITRATE ER 30 MG PO TB24
30.0000 mg | ORAL_TABLET | Freq: Every day | ORAL | Status: DC
  Administered 2020-06-11 – 2020-06-13 (×3): 30 mg via ORAL
  Filled 2020-06-11 (×3): qty 1

## 2020-06-11 MED ORDER — SODIUM CHLORIDE 0.9 % IV SOLN: INTRAVENOUS | Status: DC

## 2020-06-11 MED ORDER — NITROGLYCERIN 0.4 MG SL SUBL: 0.4000 mg | SUBLINGUAL_TABLET | SUBLINGUAL | Status: DC | PRN

## 2020-06-11 MED ORDER — ESCITALOPRAM OXALATE 10 MG PO TABS
10.0000 mg | ORAL_TABLET | Freq: Every day | ORAL | Status: DC
  Administered 2020-06-11 – 2020-06-13 (×3): 10 mg via ORAL
  Filled 2020-06-11 (×3): qty 1

## 2020-06-11 MED ORDER — FLUDROCORTISONE ACETATE 0.1 MG PO TABS
200.0000 ug | ORAL_TABLET | Freq: Every day | ORAL | Status: DC
  Administered 2020-06-11 – 2020-06-12 (×2): 200 ug via ORAL
  Filled 2020-06-11 (×2): qty 2

## 2020-06-11 NOTE — ED Provider Notes (Signed)
[facility] Emergency Department Provider Note ____________________________________________   First MD Initiated Contact with Patient 06/11/20 1215     (approximate)  I have reviewed the triage vital signs and the nursing notes.   HISTORY  Chief Complaint Kidney stone    HPI Isaiah Rangel is a 59 y.o. male with PMH as noted below and recently diagnosed with a 7 mm kidney stone who presents with persistent left flank and lower quadrant pain, worsened over the last several days, not relieved by Percocet at home, and associated with nausea and vomiting.  The patient is scheduled for a lithotripsy in 2 days, but states that due to the increase in the pain he cannot wait that long.  He was seen at the urology office yesterday and got Toradol with some good relief, but then the severe pain restarted this morning.  Past Medical History:  Diagnosis Date  . Allergy   . Anxiety   . BPH (benign prostatic hyperplasia)   . COPD (chronic obstructive pulmonary disease) ([place])   . Coronary vasospasm ([place])    a. 03/2019 Cath: RCA 49m but only 40% after intracoronary ntg-->Imdur started.  Marland Kitchen History of echocardiogram    a. 03/2019 Echo: EF 50-55%, nl RV size/fxn. Trace MR/TR.  . Non-obstructive CAD (coronary artery disease)    a. 03/2013 Cath: min irregs. EF >55%; b. 03/2019 Cath: LM nl, LAD nl, LCX nl, OM1 40, RCA 30p, 109m-->40% after IC NTG.  . OA (osteoarthritis)   . Tobacco abuse     Patient Active Problem List   Diagnosis Date Noted  . Kidney stone 06/11/2020  . NSTEMI (non-ST elevated myocardial infarction) ([place]) 03/20/2019  . COPD (chronic obstructive pulmonary disease) with chronic bronchitis ([place]) 09/05/2018  . Acute ischemic enteritis ([place]) 11/26/2017  . Coronary artery disease involving native coronary artery of native heart without angina pectoris 02/21/2016  . Cerebrovascular disease 04/29/2015  . Radicular pain of right lower back 12/05/2013  . S/P  cardiac catheterization 04/19/2013  . Smoking 09/09/2011  . Mixed hyperlipidemia 04/09/2010  . GERD 09/21/2008  . ERECTILE DYSFUNCTION 09/20/2008    Past Surgical History:  Procedure Laterality Date  . BACK SURGERY    . CARDIAC CATHETERIZATION  04/06/13   [facility]- minor luminal irregularities, otherwise normal cors; EF > 55%. Medical management and smoking cessation recommended  . KNEE ARTHROSCOPY W/ PARTIAL MEDIAL MENISCECTOMY Right 2012   [place], 2012  . LEFT HEART CATH AND CORONARY ANGIOGRAPHY N/A 03/20/2019   Procedure: LEFT HEART CATH AND CORONARY ANGIOGRAPHY;  Surgeon: Troy Sine, MD;  Location: [facility];  Service: Cardiovascular;  Laterality: N/A;  . NASAL SINUS SURGERY    . neck fusion    . VASECTOMY      Prior to Admission medications   Medication Sig Start Date End Date Taking? Authorizing Provider  amLODipine (NORVASC) 2.5 MG tablet TAKE 1 TABLET BY MOUTH  DAILY 03/28/20  Yes Gollan, Kathlene November, MD  albuterol (VENTOLIN HFA) 108 (90 Base) MCG/ACT inhaler Inhale 2 puffs into the lungs every 6 (six) hours as needed for wheezing or shortness of breath.    [provider]  aspirin EC 81 MG tablet Take 81 mg by mouth daily.    [provider]  atorvastatin (LIPITOR) 80 MG tablet Take 1 tablet (80 mg total) by mouth daily at 6 PM. 02/07/20   Loel Dubonnet, NP  budesonide-formoterol (SYMBICORT) 160-4.5 MCG/ACT inhaler Inhale 2 puffs into the lungs 2 (two) times  daily. 09/05/18   [name], [name], MD  escitalopram (LEXAPRO) 10 MG tablet TAKE 1 TABLET BY MOUTH  DAILY 03/28/20   [name], [name], MD  ezetimibe (ZETIA) 10 MG tablet TAKE 1 TABLET BY MOUTH  DAILY 06/03/20   Loel Dubonnet, NP  fludrocortisone (FLORINEF) 0.1 MG tablet TAKE 2 TABLETS BY MOUTH  DAILY 05/16/20   Loel Dubonnet, NP  isosorbide mononitrate (IMDUR) 30 MG 24 hr tablet TAKE 1 TABLET BY MOUTH  DAILY 03/05/20   Loel Dubonnet, NP  isosorbide mononitrate (IMDUR) 60 MG 24 hr tablet Take  0.5 tablets (30 mg total) by mouth at bedtime. 02/07/20   Loel Dubonnet, NP  Multiple Vitamin (MULTIVITAMIN WITH MINERALS) TABS tablet Take 1 tablet by mouth daily.    [provider]  nicotine (NICODERM CQ - DOSED IN MG/24 HOURS) 21 mg/24hr patch Place 1 patch (21 mg total) onto the skin daily. 01/04/20   Loel Dubonnet, NP  nicotine polacrilex (NICORETTE) 2 MG gum Take 1 each (2 mg total) by mouth every 4 (four) hours while awake. 01/04/20   Loel Dubonnet, NP  nitroGLYCERIN (NITROSTAT) 0.4 MG SL tablet DISSOLVE 1 TABLET UNDER THE TONGUE EVERY 5 MINUTES AS NEEDED FOR CHEST PAIN 06/06/19   Minna Merritts, MD  Omega-3 Fatty Acids (FISH OIL) 1000 MG CAPS Take 1,000 mg by mouth daily.     [provider]  ondansetron (ZOFRAN) 4 MG tablet Take 1 tablet (4 mg total) by mouth every 8 (eight) hours as needed. 06/10/20   Vaillancourt, Aldona Bar, PA-C  oxyCODONE-acetaminophen (PERCOCET/ROXICET) 5-325 MG tablet Take 1 tablet by mouth every 6 (six) hours as needed for severe pain. 06/03/20   Cuthriell, Charline Bills, PA-C  potassium chloride (KLOR-CON) 10 MEQ tablet Take 1 tablet (10 mEq total) by mouth daily. 06/05/20   [name], [name], MD  tamsulosin (FLOMAX) 0.4 MG CAPS capsule Take 1 capsule (0.4 mg total) by mouth daily. 06/05/20   CoplandFrederico Hamman, MD    Allergies Other  Family History  Problem Relation Age of Onset  . Arthritis Father   . Coronary artery disease Father   . Hypertension Father   . CVA Father 65  . Colon cancer Mother   . Heart attack Brother   . Heart attack Brother   . Coronary artery disease Brother   . Esophageal cancer Neg Hx   . Stomach cancer Neg Hx   . Rectal cancer Neg Hx     Social History Social History   Tobacco Use  . Smoking status: Current Every Day Smoker    Packs/day: 1.00    Years: 30.00    Pack years: 30.00    Types: Cigarettes  . Smokeless tobacco: Never Used  . Tobacco comment: he is aware he needs to quit   Vaping Use  .  Vaping Use: Never used  Substance Use Topics  . Alcohol use: No    Alcohol/week: 0.0 standard drinks  . Drug use: No    Review of Systems  Constitutional: No fever/chills. Eyes: No redness. ENT: No sore throat. Cardiovascular: Denies chest pain. Respiratory: Denies shortness of breath. Gastrointestinal: Positive for nausea and vomiting Genitourinary: Positive for flank pain. Musculoskeletal: Negative for back pain. Skin: Negative for rash. Neurological: Negative for headache.   ____________________________________________   PHYSICAL EXAM:  VITAL SIGNS: ED Triage Vitals  Enc Vitals Group     BP 06/11/20 1041 (!) 148/85     Pulse Rate 06/11/20 1041 76  Resp 06/11/20 1041 18     Temp 06/11/20 1041 97.7 F (36.5 C)     Temp src --      SpO2 06/11/20 1041 96 %     Weight 06/11/20 1043 130 lb (59 kg)     Height 06/11/20 1043 5\' 5"  (1.651 m)     Head Circumference --      Peak Flow --      Pain Score 06/11/20 1043 10     Pain Loc --      Pain Edu? --      Excl. in [place]? --     Constitutional: Alert and oriented.  Uncomfortable appearing but in no acute distress. Eyes: Conjunctivae are normal.  Head: Atraumatic. Nose: No congestion/rhinnorhea. Mouth/Throat: Mucous membranes are moist.   Neck: Normal range of motion.  Cardiovascular: Normal rate, regular rhythm.  Good peripheral circulation. Respiratory: Normal respiratory effort.  No retractions.  Gastrointestinal: Soft and nontender. No distention.  Genitourinary: Left CVA tenderness. Musculoskeletal: Extremities warm and well perfused.  Neurologic:  Normal speech and language. No gross focal neurologic deficits are appreciated.  Skin:  Skin is warm and dry. No rash noted. Psychiatric: Mood and affect are normal. Speech and behavior are normal.  ____________________________________________   LABS (all labs ordered are listed, but only abnormal results are displayed)  Labs Reviewed  BASIC METABOLIC PANEL -  Abnormal; Notable for the following components:      Result Value   Glucose, Bld 136 (*)    Creatinine, Ser 1.25 (*)    All other components within normal limits  CBC WITH DIFFERENTIAL/PLATELET - Abnormal; Notable for the following components:   WBC 17.7 (*)    RDW 17.1 (*)    Neutro Abs 13.6 (*)    Monocytes Absolute 1.1 (*)    Abs Immature Granulocytes 0.09 (*)    All other components within normal limits  RESPIRATORY PANEL BY RT PCR (FLU A&B, COVID)  URINALYSIS, COMPLETE (UACMP) WITH MICROSCOPIC   ____________________________________________  EKG   ____________________________________________  RADIOLOGY    ____________________________________________   PROCEDURES  Procedure(s) performed: No  Procedures  Critical Care performed: No ____________________________________________   INITIAL IMPRESSION / ASSESSMENT AND PLAN / ED COURSE  Pertinent labs & imaging results that were available during my care of the patient were reviewed by me and considered in my medical decision making (see chart for details).  59 year old male with a recent diagnosis of a 7 mm left kidney stone presents with persistent and worsening pain over the last several days.  I reviewed the past medical records in [place].  The patient was initially seen in the ED on 10/4 and had a CT showing a 7 mm left renal stone.  He followed with urology on 10/6 and was scheduled for an ESWL but required cardiology clearance first.  The procedure is currently scheduled for 10/14.  He was seen by urology yesterday again and got a dose of Toradol with improvement in his symptoms.  On exam today, the patient is uncomfortable appearing but in no acute distress.  His vital signs are normal.  The physical exam is otherwise as described above.  Lab work-up reveals a creatinine of 1.25 up from 0.7 last week, and a WBC count of 17.  Presentation is consistent with stated symptoms related to his kidney stone.  Differential  includes less likely pyelonephritis although urinalysis yesterday was reassuring and culture from 10/6 was negative.  The patient has been given Toradol and fluid with minimal  improvement.  I will give additional analgesia and consult urology.  ----------------------------------------- 2:50 PM on 06/11/2020 -----------------------------------------  The patient's pain is improved with Dilaudid.  I discussed the case with Dr. Bernardo Heater from urology who advises that there is no availability on the schedule for a lithotripsy today.  He recommends that if the patient has significant pain, he can be admitted to the hospitalist service for pain control until he is able to have a procedure.  He requested a KUB plain film.  I then discussed the case with Dr. Blaine Hamper from the hospitalist service for admission. ____________________________________________   FINAL CLINICAL IMPRESSION(S) / ED DIAGNOSES  Final diagnoses:  Kidney stone      NEW MEDICATIONS STARTED DURING THIS VISIT:  New Prescriptions   No medications on file     Note:  This document was prepared using Dragon voice recognition software and may include unintentional dictation errors.    Arta Silence, MD 06/11/20 1451

## 2020-06-11 NOTE — ED Triage Notes (Signed)
Pt comes via POV from home with c/o flank pain and dx with kidney stone. Pt states this has been going on for week. Pt states he has been to Urologist yesterday and that if it got worse to go to ED.  Pt states pain is so intense. Pt states he was suppose to have it blasted on Thursday but he can't wait.  Pt states some blood in vomit this am as well.

## 2020-06-11 NOTE — H&P (Signed)
History and Physical    Isaiah Rangel:[id number] DOB: Nov 08, 1960 DOA: 06/11/2020  Referring MD/NP/PA:   PCP: Owens Loffler, MD   Patient coming from:  The patient is coming from home.  At baseline, pt is independent for most of ADL.        Chief Complaint: Left flank pain  HPI: Isaiah Rangel is a 59 y.o. male with medical history significant of hypertension, hyperlipidemia, COPD, stroke, GERD, depression, tobacco abuse, CAD, coronary artery spasm, BPH, left kidney stone, who presents with left flank pain.  Pt states that due to several days of left flank pain and hematuria, he was seen in ED on 06/03/2020.  Patient had CT scan of abdomen/pelvis, which showed nonobstructive 7 mm of left kidney stone.  Patient states that he has persistent left flank pain.  Patient was seen by urologist in the office yesterday, and had urinalysis which was negative for UTI.  Patient is scheduled for ESWL on 06/13/20, however due to severe left flank pain, patient cannot wait for scheduled procedure.    Patient states that he has intermittent hematuria.  No dysuria or burning on urination.  Denies chest pain, shortness of breath, cough, fever or chills.  Patient states that he has nausea and vomited several times, one time with streak of minimal amount of blood.  Denies diarrhea or abdominal pain.  Patient is taking Florinef due to orthostatic hypotension.   ED Course: pt was found to have WBC 17.7, pending COVID-19 PCR, netagive urinalysis for UTI, AKI with creatinine 1.25, BUN 18, temperature normal, blood pressure 148/85, heart rate 76, RR 18, oxygen saturation 96% on room air.  Patient is admitted to [place] bed as inpatient.  Dr. Bernardo Heater of urology is consulted.  X-ray of abd: 1. There are 3 immediately adjacent calculi in the mid left kidney which correspond to the apparent larger appearing calculus in the mid left kidney on recent CT. There are apparent phleboliths in pelvis. No other abnormal  calcifications evident.  2. Moderate stool in colon.  No bowel obstruction or free air evident.  Review of Systems:   General: no fevers, chills, no body weight gain, has fatigue HEENT: no blurry vision, hearing changes or sore throat Respiratory: no dyspnea, coughing, wheezing CV: no chest pain, no palpitations GI: has nausea, vomiting, no abdominal pain, diarrhea, constipation GU: no dysuria, burning on urination, increased urinary frequency, has hematuria and left flank pain Ext: no leg edema Neuro: no unilateral weakness, numbness, or tingling, no vision change or hearing loss Skin: no rash, no skin tear. MSK: No muscle spasm, no deformity, no limitation of range of movement in spin Heme: No easy bruising.  Travel history: No recent long distant travel.  Allergy:  Allergies  Allergen Reactions  . Other Nausea And Vomiting and Other (See Comments)    Quail eggs caused severe stomach pain and n/v    Past Medical History:  Diagnosis Date  . Allergy   . Anxiety   . BPH (benign prostatic hyperplasia)   . COPD (chronic obstructive pulmonary disease) ([place])   . Coronary vasospasm ([place])    a. 03/2019 Cath: RCA 17m but only 40% after intracoronary ntg-->Imdur started.  Marland Kitchen History of echocardiogram    a. 03/2019 Echo: EF 50-55%, nl RV size/fxn. Trace MR/TR.  . Non-obstructive CAD (coronary artery disease)    a. 03/2013 Cath: min irregs. EF >55%; b. 03/2019 Cath: LM nl, LAD nl, LCX nl, OM1 40, RCA 30p, 38m-->40% after IC NTG.  Marland Kitchen  OA (osteoarthritis)   . Tobacco abuse     Past Surgical History:  Procedure Laterality Date  . BACK SURGERY    . CARDIAC CATHETERIZATION  04/06/13   [facility]- minor luminal irregularities, otherwise normal cors; EF > 55%. Medical management and smoking cessation recommended  . KNEE ARTHROSCOPY W/ PARTIAL MEDIAL MENISCECTOMY Right 2012   [place], 2012  . LEFT HEART CATH AND CORONARY ANGIOGRAPHY N/A 03/20/2019   Procedure: LEFT HEART CATH AND CORONARY ANGIOGRAPHY;   Surgeon: Troy Sine, MD;  Location: [facility];  Service: Cardiovascular;  Laterality: N/A;  . NASAL SINUS SURGERY    . neck fusion    . VASECTOMY      Social History:  reports that he has been smoking cigarettes. He has a 30.00 pack-year smoking history. He has never used smokeless tobacco. He reports that he does not drink alcohol and does not use drugs.  Family History:  Family History  Problem Relation Age of Onset  . Arthritis Father   . Coronary artery disease Father   . Hypertension Father   . CVA Father 46  . Colon cancer Mother   . Heart attack Brother   . Heart attack Brother   . Coronary artery disease Brother   . Esophageal cancer Neg Hx   . Stomach cancer Neg Hx   . Rectal cancer Neg Hx      Prior to Admission medications   Medication Sig Start Date End Date Taking? Authorizing Provider  albuterol (VENTOLIN HFA) 108 (90 Base) MCG/ACT inhaler Inhale 2 puffs into the lungs every 6 (six) hours as needed for wheezing or shortness of breath.    [provider]  amLODipine (NORVASC) 2.5 MG tablet TAKE 1 TABLET BY MOUTH  DAILY 03/28/20   Minna Merritts, MD  aspirin EC 81 MG tablet Take 81 mg by mouth daily.    [provider]  atorvastatin (LIPITOR) 80 MG tablet Take 1 tablet (80 mg total) by mouth daily at 6 PM. 02/07/20   Loel Dubonnet, NP  budesonide-formoterol (SYMBICORT) 160-4.5 MCG/ACT inhaler Inhale 2 puffs into the lungs 2 (two) times daily. 09/05/18   [name], [name], MD  escitalopram (LEXAPRO) 10 MG tablet TAKE 1 TABLET BY MOUTH  DAILY 03/28/20   [name], [name], MD  ezetimibe (ZETIA) 10 MG tablet TAKE 1 TABLET BY MOUTH  DAILY 06/03/20   Loel Dubonnet, NP  fludrocortisone (FLORINEF) 0.1 MG tablet TAKE 2 TABLETS BY MOUTH  DAILY 05/16/20   Loel Dubonnet, NP  isosorbide mononitrate (IMDUR) 30 MG 24 hr tablet TAKE 1 TABLET BY MOUTH  DAILY 03/05/20   Loel Dubonnet, NP  isosorbide mononitrate (IMDUR) 60 MG 24 hr tablet Take 0.5  tablets (30 mg total) by mouth at bedtime. 02/07/20   Loel Dubonnet, NP  Multiple Vitamin (MULTIVITAMIN WITH MINERALS) TABS tablet Take 1 tablet by mouth daily.    [provider]  nicotine (NICODERM CQ - DOSED IN MG/24 HOURS) 21 mg/24hr patch Place 1 patch (21 mg total) onto the skin daily. 01/04/20   Loel Dubonnet, NP  nicotine polacrilex (NICORETTE) 2 MG gum Take 1 each (2 mg total) by mouth every 4 (four) hours while awake. 01/04/20   Loel Dubonnet, NP  nitroGLYCERIN (NITROSTAT) 0.4 MG SL tablet DISSOLVE 1 TABLET UNDER THE TONGUE EVERY 5 MINUTES AS NEEDED FOR CHEST PAIN 06/06/19   Minna Merritts, MD  Omega-3 Fatty Acids (FISH OIL) 1000 MG CAPS Take 1,000 mg  by mouth daily.     [provider]  ondansetron (ZOFRAN) 4 MG tablet Take 1 tablet (4 mg total) by mouth every 8 (eight) hours as needed. 06/10/20   Vaillancourt, Aldona Bar, PA-C  oxyCODONE-acetaminophen (PERCOCET/ROXICET) 5-325 MG tablet Take 1 tablet by mouth every 6 (six) hours as needed for severe pain. 06/03/20   Cuthriell, Charline Bills, PA-C  potassium chloride (KLOR-CON) 10 MEQ tablet Take 1 tablet (10 mEq total) by mouth daily. 06/05/20   [name], [name], MD  tamsulosin (FLOMAX) 0.4 MG CAPS capsule Take 1 capsule (0.4 mg total) by mouth daily. 06/05/20   Owens Loffler, MD    Physical Exam: Vitals:   06/11/20 1502 06/11/20 1700 06/11/20 1730 06/11/20 1800  BP: (!) 146/87 107/82 (!) 160/93 122/79  Pulse:  67 69 62  Resp:      Temp:      SpO2:  96% 93% (!) 86%  Weight:      Height:       General: Not in acute distress HEENT:       Eyes: PERRL, EOMI, no scleral icterus.       ENT: No discharge from the ears and nose, no pharynx injection, no tonsillar enlargement.        Neck: No JVD, no bruit, no mass felt. Heme: No neck lymph node enlargement. Cardiac: S1/S2, RRR, No murmurs, No gallops or rubs. Respiratory:  No rales, wheezing, rhonchi or rubs. GI: Soft, nondistended, nontender, no rebound pain,  no organomegaly, BS present. GU: positive left CVA tenderness Ext: No pitting leg edema bilaterally. 2+DP/PT pulse bilaterally. Musculoskeletal: No joint deformities, No joint redness or warmth, no limitation of ROM in spin. Skin: No rashes.  Neuro: Alert, oriented X3, cranial nerves II-XII grossly intact, moves all extremities normally.  Psych: Patient is not psychotic, no suicidal or hemocidal ideation.  Labs on Admission: I have personally reviewed following labs and imaging studies  CBC: Recent Labs  Lab 06/11/20 1059  WBC 17.7*  NEUTROABS 13.6*  HGB 13.8  HCT 40.4  MCV 93.1  PLT 638   Basic Metabolic Panel: Recent Labs  Lab 06/11/20 1059  NA 144  K 3.5  CL 106  CO2 27  GLUCOSE 136*  BUN 18  CREATININE 1.25*  CALCIUM 9.5   GFR: Estimated Creatinine Clearance: 52.4 mL/min (A) (by C-G formula based on SCr of 1.25 mg/dL (H)). Liver Function Tests: No results for input(s): AST, ALT, ALKPHOS, BILITOT, PROT, ALBUMIN in the last 168 hours. No results for input(s): LIPASE, AMYLASE in the last 168 hours. No results for input(s): AMMONIA in the last 168 hours. Coagulation Profile: No results for input(s): INR, PROTIME in the last 168 hours. Cardiac Enzymes: No results for input(s): CKTOTAL, CKMB, CKMBINDEX, TROPONINI in the last 168 hours. BNP (last 3 results) No results for input(s): PROBNP in the last 8760 hours. HbA1C: No results for input(s): HGBA1C in the last 72 hours. CBG: No results for input(s): GLUCAP in the last 168 hours. Lipid Profile: No results for input(s): CHOL, HDL, LDLCALC, TRIG, CHOLHDL, LDLDIRECT in the last 72 hours. Thyroid Function Tests: No results for input(s): TSH, T4TOTAL, FREET4, T3FREE, THYROIDAB in the last 72 hours. Anemia Panel: No results for input(s): VITAMINB12, FOLATE, FERRITIN, TIBC, IRON, RETICCTPCT in the last 72 hours. Urine analysis:    Component Value Date/Time   COLORURINE YELLOW (A) 06/11/2020 1552   APPEARANCEUR  CLOUDY (A) 06/11/2020 1552   APPEARANCEUR Cloudy (A) 06/10/2020 1547   LABSPEC 1.010 06/11/2020 1552  PHURINE 8.0 06/11/2020 1552   GLUCOSEU NEGATIVE 06/11/2020 1552   HGBUR SMALL (A) 06/11/2020 1552   HGBUR moderate 07/09/2010 1144   BILIRUBINUR NEGATIVE 06/11/2020 1552   BILIRUBINUR Negative 06/10/2020 1547   KETONESUR 5 (A) 06/11/2020 1552   PROTEINUR NEGATIVE 06/11/2020 1552   UROBILINOGEN 0.2 07/09/2010 1144   NITRITE NEGATIVE 06/11/2020 1552   LEUKOCYTESUR NEGATIVE 06/11/2020 1552   Sepsis Labs: @LABRCNTIP (procalcitonin:4,lacticidven:4) ) Recent Results (from the past 240 hour(s))  Microscopic Examination     Status: Abnormal   Collection Time: 06/05/20  3:47 PM   Urine  Result Value Ref Range Status   WBC, UA 0-5 0 - 5 /hpf Final   RBC 11-30 (A) 0 - 2 /hpf Final   Epithelial Cells (non renal) 0-10 0 - 10 /hpf Final   Casts Present (A) None seen /lpf Final   Cast Type Granular casts (A) N/A Final   Crystals Present (A) N/A Final   Crystal Type Amorphous Sediment N/A Final   Bacteria, UA None seen None seen/Few Final  CULTURE, URINE COMPREHENSIVE     Status: None   Collection Time: 06/05/20  4:34 PM   Specimen: Urine   Urine  Result Value Ref Range Status   Urine Culture, Comprehensive Final report  Final   Organism ID, Bacteria Comment  Final    Comment: Mixed urogenital flora 10,000-25,000 colony forming units per mL   Microscopic Examination     Status: Abnormal   Collection Time: 06/10/20  3:47 PM   Urine  Result Value Ref Range Status   WBC, UA 0-5 0 - 5 /hpf Final   RBC >30 (A) 0 - 2 /hpf Final   Epithelial Cells (non renal) 0-10 0 - 10 /hpf Final   Casts Present (A) None seen /lpf Final   Cast Type Granular casts (A) N/A Final   Bacteria, UA Few None seen/Few Final  Respiratory Panel by RT PCR (Flu A&B, Covid) - Nasopharyngeal Swab     Status: None   Collection Time: 06/11/20  3:05 PM   Specimen: Nasopharyngeal Swab  Result Value Ref Range Status    SARS Coronavirus 2 by RT PCR NEGATIVE NEGATIVE Final    Comment: (NOTE) SARS-CoV-2 target nucleic acids are NOT DETECTED.  The SARS-CoV-2 RNA is generally detectable in upper respiratoy specimens during the acute phase of infection. The lowest concentration of SARS-CoV-2 viral copies this assay can detect is 131 copies/mL. A negative result does not preclude SARS-Cov-2 infection and should not be used as the sole basis for treatment or other patient management decisions. A negative result may occur with  improper specimen collection/handling, submission of specimen other than nasopharyngeal swab, presence of viral mutation(s) within the areas targeted by this assay, and inadequate number of viral copies (<131 copies/mL). A negative result must be combined with clinical observations, patient history, and epidemiological information. The expected result is Negative.  Fact Sheet for Patients:  PinkCheek.be  Fact Sheet for Healthcare Providers:  GravelBags.it  This test is no t yet approved or cleared by the [place] FDA and  has been authorized for detection and/or diagnosis of SARS-CoV-2 by FDA under an Emergency Use Authorization (EUA). This EUA will remain  in effect (meaning this test can be used) for the duration of the COVID-19 declaration under Section 564(b)(1) of the Act, 21 U.S.C. section 360bbb-3(b)(1), unless the authorization is terminated or revoked sooner.     Influenza A by PCR NEGATIVE NEGATIVE Final   Influenza B by PCR  NEGATIVE NEGATIVE Final    Comment: (NOTE) The Xpert Xpress SARS-CoV-2/FLU/RSV assay is intended as an aid in  the diagnosis of influenza from Nasopharyngeal swab specimens and  should not be used as a sole basis for treatment. Nasal washings and  aspirates are unacceptable for Xpert Xpress SARS-CoV-2/FLU/RSV  testing.  Fact Sheet for  Patients: PinkCheek.be  Fact Sheet for Healthcare Providers: GravelBags.it  This test is not yet approved or cleared by the [place] FDA and  has been authorized for detection and/or diagnosis of SARS-CoV-2 by  FDA under an Emergency Use Authorization (EUA). This EUA will remain  in effect (meaning this test can be used) for the duration of the  Covid-19 declaration under Section 564(b)(1) of the Act, 21  U.S.C. section 360bbb-3(b)(1), unless the authorization is  terminated or revoked. Performed at [facility], [address] [place], [place] [address]      Radiological Exams on Admission: DG Abdomen 1 View  Result Date: 06/11/2020 CLINICAL DATA:  Flank pain EXAM: ABDOMEN - 1 VIEW COMPARISON:  CT abdomen and pelvis June 03, 2020 FINDINGS: There are three immediately adjacent calculi in the mid left kidney, each measuring 3 mm in size. There are apparent phleboliths in the pelvis. No other abnormal calcifications are evident. There is moderate stool in the colon. There is no bowel dilatation or air-fluid level to suggest bowel obstruction. No free air. IMPRESSION: There are 3 immediately adjacent calculi in the mid left kidney which correspond to the apparent larger appearing calculus in the mid left kidney on recent CT. There are apparent phleboliths in pelvis. No other abnormal calcifications evident. Moderate stool in colon.  No bowel obstruction or free air evident. Electronically Signed   By: Lowella Grip III M.D.   On: 06/11/2020 14:59     EKG:  Not done in ED, will get one.   Assessment/Plan Principal Problem:   Kidney stone Active Problems:   Mixed hyperlipidemia   Smoking   Cerebrovascular disease   COPD (chronic obstructive pulmonary disease) with chronic bronchitis (HCC)   CAD (coronary artery disease)   Depression   AKI (acute kidney injury) (HCC)   Leukocytosis   Nausea and vomiting    Orthostatic hypotension   Kidney stone-left side: Urinalysis negative for UTI. Dr. Bernardo Heater of urology is consulted.  -will admitted to [place] bed as inpatient -Pain control: As needed morphine and Percocet -N.p.o. after midnight for possible procedure   Mixed hyperlipidemia -Lipitor,   Smoking -Nicotine patch  Cerebrovascular disease -Aspirin and Lipitor, Zetia  COPD (chronic obstructive pulmonary disease) with chronic bronchitis (HCC): Stable -Bronchodilators  CAD (coronary artery disease): No chest pain -Continue aspirin and Lipitor, Zetia -As needed nitroglycerin -Imdur  Depression: -Continue home medications  AKI (acute kidney injury) ([place]): Mild, likely due to kidney stone -IV fluid: 1 L normal saline, followed by 75 cc/h  Leukocytosis: WBC 17.6.  No fever.  Urinalysis negative for UTI.  Likely due to steroid use -Follow-up of CBC -Get blood culture and urine culture  Nausea and vomiting: Patient reports that at one time he vomited streak of minimal amount of blood.  Hemoglobin 13.8. - Start IV pantoprazole 40 mg bid - Zofran IV for nausea - Avoid NSAIDs and SQ heparin - Maintain IV access (2 large bore IVs if possible). - Monitor closely and follow q6h cbc, transfuse as necessary, if Hgb<7.0 - LaB: INR, PTT and type screen  Orthostatic hypotension -Continue Florinef  DVT ppx: SCD Code Status: Full code Family Communication: not done, no family member is at bed side.      Disposition Plan:  Anticipate discharge back to previous environment Consults called:  Dr. Bernardo Heater Admission status: Med-surg bed as inpt     Patient has multiple comorbidities, now presents with severe left flank pain due to kidney stone.  Patient also has AKI and leukocytosis.  Patient will need procedure ESWL.  His presentation is highly complicated.  Patient is at high risk of deterioration and development of complication.  Will need to be treated in hospital for at  least 2 days.     Date of Service 06/11/2020    Ivor Costa [facility]   If 7PM-7AM, please contact night-coverage [email or web address] 06/11/2020, 6:46 PM

## 2020-06-11 NOTE — Consult Note (Signed)
Urology Consult  I have been asked to see the patient by Dr. Blaine Hamper, for evaluation and management of left flank pain.  Chief Complaint: left flank pain  History of Present Illness: Isaiah Rangel is a 59 y.o. year old male with a history of left flank pain scheduled to undergo ESWL with Dr. Erlene Quan in 2 days for management of a 7 mm left upper pole stone who presented to the ED today with uncontrollable flank pain, nausea, and vomiting.  He has been afebrile, VSS.  I saw him in clinic yesterday for the same.  UA notable for microscopic hematuria but otherwise benign at that time.  He was afebrile, VSS at that time.  I treated him with Phenergan and Toradol in clinic with symptomatic relief and prescribed Zofran for at home use.  I counseled him to return to the emergency department if his pain returned and became uncontrollable, as Toradol is contraindicated 48 hours in advance of ESWL.  Today he reports being awoken this morning with a sudden return of his left flank pain, nausea, and vomiting.  It has been intermittent throughout the day and he ultimately decided to proceed to the emergency department as instructed.  KUB revealed 3 adjacent calculi in the mid left kidney believed to correspond with the previously seen larger-appearing calculus on CTAP with contrast dated 06/03/2020.  Admission labs notable for creatinine 1.25 (baseline 0.75-0.85); WBC count 17.7; Covid negative; UA with 11-20 WBCs/hpf, 0-5 WBCs/hpf, no nitrites, no bacteria.  Past Medical History:  Diagnosis Date  . Allergy   . Anxiety   . BPH (benign prostatic hyperplasia)   . COPD (chronic obstructive pulmonary disease) ([place])   . Coronary vasospasm ([place])    a. 03/2019 Cath: RCA 16m but only 40% after intracoronary ntg-->Imdur started.  Marland Kitchen History of echocardiogram    a. 03/2019 Echo: EF 50-55%, nl RV size/fxn. Trace MR/TR.  . Non-obstructive CAD (coronary artery disease)    a. 03/2013 Cath: min irregs. EF >55%; b.  03/2019 Cath: LM nl, LAD nl, LCX nl, OM1 40, RCA 30p, 32m-->40% after IC NTG.  . OA (osteoarthritis)   . Tobacco abuse     Past Surgical History:  Procedure Laterality Date  . BACK SURGERY    . CARDIAC CATHETERIZATION  04/06/13   [facility]- minor luminal irregularities, otherwise normal cors; EF > 55%. Medical management and smoking cessation recommended  . KNEE ARTHROSCOPY W/ PARTIAL MEDIAL MENISCECTOMY Right 2012   [place], 2012  . LEFT HEART CATH AND CORONARY ANGIOGRAPHY N/A 03/20/2019   Procedure: LEFT HEART CATH AND CORONARY ANGIOGRAPHY;  Surgeon: Troy Sine, MD;  Location: [facility];  Service: Cardiovascular;  Laterality: N/A;  . NASAL SINUS SURGERY    . neck fusion    . VASECTOMY      Home Medications:  Current Meds  Medication Sig  . amLODipine (NORVASC) 2.5 MG tablet TAKE 1 TABLET BY MOUTH  DAILY  . aspirin EC 81 MG tablet Take 81 mg by mouth daily.  Marland Kitchen atorvastatin (LIPITOR) 80 MG tablet Take 1 tablet (80 mg total) by mouth daily at 6 PM.  . escitalopram (LEXAPRO) 10 MG tablet TAKE 1 TABLET BY MOUTH  DAILY  . ezetimibe (ZETIA) 10 MG tablet TAKE 1 TABLET BY MOUTH  DAILY  . fludrocortisone (FLORINEF) 0.1 MG tablet TAKE 2 TABLETS BY MOUTH  DAILY  . isosorbide mononitrate (IMDUR) 30 MG 24 hr tablet TAKE 1 TABLET BY MOUTH  DAILY  .  Multiple Vitamin (MULTIVITAMIN WITH MINERALS) TABS tablet Take 1 tablet by mouth daily.  . Omega-3 Fatty Acids (FISH OIL) 1000 MG CAPS Take 1,000 mg by mouth daily.     Allergies:  Allergies  Allergen Reactions  . Other Nausea And Vomiting and Other (See Comments)    Quail eggs caused severe stomach pain and n/v    Family History  Problem Relation Age of Onset  . Arthritis Father   . Coronary artery disease Father   . Hypertension Father   . CVA Father 40  . Colon cancer Mother   . Heart attack Brother   . Heart attack Brother   . Coronary artery disease Brother   . Esophageal cancer Neg Hx   . Stomach cancer Neg Hx   . Rectal  cancer Neg Hx     Social History:  reports that he has been smoking cigarettes. He has a 30.00 pack-year smoking history. He has never used smokeless tobacco. He reports that he does not drink alcohol and does not use drugs.  ROS: A complete review of systems was performed.  All systems are negative except for pertinent findings as noted.  Physical Exam:  Vital signs in last 24 hours: Temp:  [97.7 F (36.5 C)] 97.7 F (36.5 C) (10/12 1041) Pulse Rate:  [76] 76 (10/12 1041) Resp:  [18] 18 (10/12 1041) BP: (146-148)/(85-87) 146/87 (10/12 1502) SpO2:  [96 %] 96 % (10/12 1041) Weight:  [59 kg] 59 kg (10/12 1043) Constitutional:  Alert and oriented, uncomfortable appearing, hunched over in bed HEENT: [place] AT, moist mucus membranes Cardiovascular: No clubbing, cyanosis, or edema Respiratory: Normal respiratory effort GU: Left CVA tenderness Skin: No rashes, bruises or suspicious lesions Neurologic: Grossly intact, no focal deficits, moving all 4 extremities Psychiatric: Normal mood and affect  Laboratory Data:  Recent Labs    06/11/20 1059  WBC 17.7*  HGB 13.8  HCT 40.4   Recent Labs    06/11/20 1059  NA 144  K 3.5  CL 106  CO2 27  GLUCOSE 136*  BUN 18  CREATININE 1.25*  CALCIUM 9.5   Urinalysis    Component Value Date/Time   COLORURINE YELLOW (A) 06/11/2020 1552   APPEARANCEUR CLOUDY (A) 06/11/2020 1552   APPEARANCEUR Cloudy (A) 06/10/2020 1547   LABSPEC 1.010 06/11/2020 1552   PHURINE 8.0 06/11/2020 1552   GLUCOSEU NEGATIVE 06/11/2020 1552   HGBUR SMALL (A) 06/11/2020 1552   HGBUR moderate 07/09/2010 1144   BILIRUBINUR NEGATIVE 06/11/2020 1552   BILIRUBINUR Negative 06/10/2020 1547   KETONESUR 5 (A) 06/11/2020 1552   PROTEINUR NEGATIVE 06/11/2020 1552   UROBILINOGEN 0.2 07/09/2010 1144   NITRITE NEGATIVE 06/11/2020 1552   LEUKOCYTESUR NEGATIVE 06/11/2020 1552   Results for orders placed or performed during the hospital encounter of 06/11/20  Respiratory  Panel by RT PCR (Flu A&B, Covid) - Nasopharyngeal Swab     Status: None   Collection Time: 06/11/20  3:05 PM   Specimen: Nasopharyngeal Swab  Result Value Ref Range Status   SARS Coronavirus 2 by RT PCR NEGATIVE NEGATIVE Final    Comment: (NOTE) SARS-CoV-2 target nucleic acids are NOT DETECTED.  The SARS-CoV-2 RNA is generally detectable in upper respiratoy specimens during the acute phase of infection. The lowest concentration of SARS-CoV-2 viral copies this assay can detect is 131 copies/mL. A negative result does not preclude SARS-Cov-2 infection and should not be used as the sole basis for treatment or other patient management decisions. A negative result may occur  with  improper specimen collection/handling, submission of specimen other than nasopharyngeal swab, presence of viral mutation(s) within the areas targeted by this assay, and inadequate number of viral copies (<131 copies/mL). A negative result must be combined with clinical observations, patient history, and epidemiological information. The expected result is Negative.  Fact Sheet for Patients:  PinkCheek.be  Fact Sheet for Healthcare Providers:  GravelBags.it  This test is no t yet approved or cleared by the [place] FDA and  has been authorized for detection and/or diagnosis of SARS-CoV-2 by FDA under an Emergency Use Authorization (EUA). This EUA will remain  in effect (meaning this test can be used) for the duration of the COVID-19 declaration under Section 564(b)(1) of the Act, 21 U.S.C. section 360bbb-3(b)(1), unless the authorization is terminated or revoked sooner.     Influenza A by PCR NEGATIVE NEGATIVE Final   Influenza B by PCR NEGATIVE NEGATIVE Final    Comment: (NOTE) The Xpert Xpress SARS-CoV-2/FLU/RSV assay is intended as an aid in  the diagnosis of influenza from Nasopharyngeal swab specimens and  should not be used as a sole basis  for treatment. Nasal washings and  aspirates are unacceptable for Xpert Xpress SARS-CoV-2/FLU/RSV  testing.  Fact Sheet for Patients: PinkCheek.be  Fact Sheet for Healthcare Providers: GravelBags.it  This test is not yet approved or cleared by the [place] FDA and  has been authorized for detection and/or diagnosis of SARS-CoV-2 by  FDA under an Emergency Use Authorization (EUA). This EUA will remain  in effect (meaning this test can be used) for the duration of the  Covid-19 declaration under Section 564(b)(1) of the Act, 21  U.S.C. section 360bbb-3(b)(1), unless the authorization is  terminated or revoked. Performed at [facility], [address] [place], [place] [address]    Radiologic Imaging: DG Abdomen 1 View  Result Date: 06/11/2020 CLINICAL DATA:  Flank pain EXAM: ABDOMEN - 1 VIEW COMPARISON:  CT abdomen and pelvis June 03, 2020 FINDINGS: There are three immediately adjacent calculi in the mid left kidney, each measuring 3 mm in size. There are apparent phleboliths in the pelvis. No other abnormal calcifications are evident. There is moderate stool in the colon. There is no bowel dilatation or air-fluid level to suggest bowel obstruction. No free air. IMPRESSION: There are 3 immediately adjacent calculi in the mid left kidney which correspond to the apparent larger appearing calculus in the mid left kidney on recent CT. There are apparent phleboliths in pelvis. No other abnormal calcifications evident. Moderate stool in colon.  No bowel obstruction or free air evident. Electronically Signed   By: Lowella Grip III M.D.   On: 06/11/2020 14:59   Assessment & Plan:  59 year old male with known left nephrolithiasis scheduled to undergo ESWL with Dr. Erlene Quan on 06/13/2020 presents to the ED with recurrence of uncontrollable left flank pain, nausea, and vomiting.  Admission labs notable for leukocytosis and  benign appearing UA.  He remains afebrile with stable vitals.  KUB today reveals 3 adjacent kidney stones in the mid left kidney, previously seen as one larger upper pole stone on prior CT with contrast.  Agree with pain control, fluids, and supportive care with plans to reassess in the morning.  Given KUB findings of multiple rather than a solitary stone, ESWL no longer remains an ideal treatment modality.  He will likely require ureteroscopy for definitive management of multiple stones versus stent placement if he decompensates with concern for possible pyonephrosis.  No plans for urologic  intervention today, okay for diet.  Please resume n.p.o. status at midnight tonight pending reevaluation in the morning.  Thank you for involving me in this patient's care, I will continue to follow along.  Debroah Loop, PA-C 06/11/2020 5:26 PM

## 2020-06-12 ENCOUNTER — Other Ambulatory Visit: Payer: Self-pay | Admitting: Radiology

## 2020-06-12 DIAGNOSIS — N200 Calculus of kidney: Secondary | ICD-10-CM | POA: Diagnosis not present

## 2020-06-12 LAB — CBC
HCT: 34.4 % — ABNORMAL LOW (ref 39.0–52.0)
HCT: 34 % — ABNORMAL LOW (ref 39.0–52.0)
HCT: 35 % — ABNORMAL LOW (ref 39.0–52.0)
HCT: 33.1 % — ABNORMAL LOW (ref 39.0–52.0)
Hemoglobin: 11.6 g/dL — ABNORMAL LOW (ref 13.0–17.0)
Hemoglobin: 11.8 g/dL — ABNORMAL LOW (ref 13.0–17.0)
Hemoglobin: 12 g/dL — ABNORMAL LOW (ref 13.0–17.0)
Hemoglobin: 11.2 g/dL — ABNORMAL LOW (ref 13.0–17.0)
MCH: 31.7 pg (ref 26.0–34.0)
MCH: 32.1 pg (ref 26.0–34.0)
MCH: 32 pg (ref 26.0–34.0)
MCH: 31.6 pg (ref 26.0–34.0)
MCHC: 33.7 g/dL (ref 30.0–36.0)
MCHC: 34.7 g/dL (ref 30.0–36.0)
MCHC: 34.3 g/dL (ref 30.0–36.0)
MCHC: 33.8 g/dL (ref 30.0–36.0)
MCV: 94 fL (ref 80.0–100.0)
MCV: 92.4 fL (ref 80.0–100.0)
MCV: 93.3 fL (ref 80.0–100.0)
MCV: 93.5 fL (ref 80.0–100.0)
Platelets: 172 10*3/uL (ref 150–400)
Platelets: 174 10*3/uL (ref 150–400)
Platelets: 180 10*3/uL (ref 150–400)
Platelets: 185 10*3/uL (ref 150–400)
RBC: 3.66 MIL/uL — ABNORMAL LOW (ref 4.22–5.81)
RBC: 3.68 MIL/uL — ABNORMAL LOW (ref 4.22–5.81)
RBC: 3.75 MIL/uL — ABNORMAL LOW (ref 4.22–5.81)
RBC: 3.54 MIL/uL — ABNORMAL LOW (ref 4.22–5.81)
RDW: 16.9 % — ABNORMAL HIGH (ref 11.5–15.5)
RDW: 16.9 % — ABNORMAL HIGH (ref 11.5–15.5)
RDW: 16.9 % — ABNORMAL HIGH (ref 11.5–15.5)
RDW: 16.6 % — ABNORMAL HIGH (ref 11.5–15.5)
WBC: 13.9 10*3/uL — ABNORMAL HIGH (ref 4.0–10.5)
WBC: 14.5 10*3/uL — ABNORMAL HIGH (ref 4.0–10.5)
WBC: 14.8 10*3/uL — ABNORMAL HIGH (ref 4.0–10.5)
WBC: 13 10*3/uL — ABNORMAL HIGH (ref 4.0–10.5)
nRBC: 0 % (ref 0.0–0.2)
nRBC: 0 % (ref 0.0–0.2)
nRBC: 0 % (ref 0.0–0.2)
nRBC: 0 % (ref 0.0–0.2)

## 2020-06-12 LAB — BASIC METABOLIC PANEL
Anion gap: 8 (ref 5–15)
BUN: 18 mg/dL (ref 6–20)
CO2: 25 mmol/L (ref 22–32)
Calcium: 8.1 mg/dL — ABNORMAL LOW (ref 8.9–10.3)
Chloride: 107 mmol/L (ref 98–111)
Creatinine, Ser: 1.35 mg/dL — ABNORMAL HIGH (ref 0.61–1.24)
GFR, Estimated: 57 mL/min — ABNORMAL LOW (ref >60)
Glucose, Bld: 96 mg/dL (ref 70–99)
Potassium: 3.1 mmol/L — ABNORMAL LOW (ref 3.5–5.1)
Sodium: 140 mmol/L (ref 135–145)

## 2020-06-12 LAB — URINE CULTURE: Culture: <10000 — AB

## 2020-06-12 LAB — SARS CORONAVIRUS 2 (TAT 6-24 HRS): SARS Coronavirus 2: NEGATIVE

## 2020-06-12 LAB — HIV ANTIBODY (ROUTINE TESTING W REFLEX): HIV Screen 4th Generation wRfx: NONREACTIVE

## 2020-06-12 MED ORDER — FLUDROCORTISONE ACETATE 0.1 MG PO TABS
0.1000 mg | ORAL_TABLET | Freq: Every day | ORAL | Status: AC
  Administered 2020-06-13: 0.1 mg via ORAL
  Filled 2020-06-12: qty 1

## 2020-06-12 MED ORDER — CEFAZOLIN SODIUM-DEXTROSE 2-4 GM/100ML-% IV SOLN
2.0000 g | INTRAVENOUS | Status: AC
  Administered 2020-06-13: 2 g via INTRAVENOUS
  Filled 2020-06-12: qty 100

## 2020-06-12 MED ORDER — INFLUENZA VAC SPLIT QUAD 0.5 ML IM SUSY
0.5000 mL | PREFILLED_SYRINGE | INTRAMUSCULAR | Status: AC
  Administered 2020-06-13: 0.5 mL via INTRAMUSCULAR
  Filled 2020-06-12: qty 0.5

## 2020-06-12 MED ORDER — POTASSIUM CHLORIDE CRYS ER 20 MEQ PO TBCR
40.0000 meq | EXTENDED_RELEASE_TABLET | Freq: Once | ORAL | Status: AC
  Administered 2020-06-12: 40 meq via ORAL
  Filled 2020-06-12: qty 2

## 2020-06-12 MED ORDER — FLUDROCORTISONE ACETATE 0.1 MG PO TABS: 0.0500 mg | ORAL_TABLET | Freq: Once | ORAL | Status: DC

## 2020-06-12 NOTE — Progress Notes (Signed)
PROGRESS NOTE    Patient: Isaiah Rangel                            PCP: Owens Loffler, MD                    DOB: 1960-10-30            DOA: 06/11/2020 OIZ:[id number]             DOS: 06/12/2020, 12:35 PM   LOS: 1 day   Date of Service: The patient was seen and examined on 06/12/2020  Subjective:   The patient was seen and examined this Am. Hemodynamically stable, complaining of hunger, pain in the left flank area Otherwise no issues overnight .  N.p.o.    Brief Narrative:   Isaiah Rangel is a 59 y.o. male with medical history significant of hypertension, hyperlipidemia, COPD, stroke, GERD, depression, tobacco abuse, CAD, coronary artery spasm, BPH, left kidney stone, who presents with left flank pain. Admitted for left renal nephrolithiasis -hematuria  Patient had CT scan of abdomen/pelvis, which showed nonobstructive 7 mm of left kidney stone.   Per patient was seen by urologist as an outpatient, UA negative   Patient is scheduled for ESWL on 06/13/20, however due to severe left flank pain, patient cannot wait for scheduled procedure.     Patient is taking Florinef due to orthostatic hypotension.   ED Course: pt was found to have WBC 17.7, pending COVID-19 PCR, netagive urinalysis for UTI, AKI with creatinine 1.25, BUN 18, temperature normal, blood pressure 148/85, heart rate 76, RR 18, oxygen saturation 96% on room air.  Patient is admitted to [place] bed as inpatient.  Dr. Bernardo Heater of urology is consulted.   Assessment & Plan:   Principal Problem:   Kidney stone Active Problems:   Mixed hyperlipidemia   Smoking   Cerebrovascular disease   COPD (chronic obstructive pulmonary disease) with chronic bronchitis (HCC)   CAD (coronary artery disease)   Depression   AKI (acute kidney injury) (HCC)   Leukocytosis   Nausea and vomiting   Orthostatic hypotension    Kidney stone-left side:  -N.p.o. Urinalysis negative for UTI.  -Dr. Bernardo Heater of urology is  consulted.... Anticipating cystoscopy urogram questionable lithotripsy -Pain management -Continue IV fluids   Mixed hyperlipidemia -We will continue statins  Smoking -Nicotine patch  Cerebrovascular disease -Continue - aspirin and Lipitor, Zetia  COPD (chronic obstructive pulmonary disease) with chronic bronchitis ([place]):  -Stable no signs of exacerbation -Bronchodilators  CAD (coronary artery disease): No chest pain -Continue aspirin and Lipitor, Zetia -As needed nitroglycerin -Imdur  Depression: -Stable  AKI (acute kidney injury) ([place]):  Mild, likely due to kidney stone -IV fluid: 1 L normal saline, followed by 75 cc/h -Creatinine 1.25, 1.35,  Leukocytosis:  WBC 17.6.  Likely reactive -on steroids afebrile, normotensive  Urinalysis negative for UTI.   -We will follow up with blood/urine cultures  Nausea and vomiting:  Patient reports that at one time he vomited streak of minimal amount of blood.     hemoglobin 13.8  >> 12.0 -Likely due to persistent vomiting -No further episodes, we will continue IV pantoprazole 40 mg bid -Continue as needed Zofran IV for nausea - Avoid NSAIDs and SQ heparin -Continue IV fluid hydration -Monitoring H&H closely -Labs INR within normal limits 1.0  Orthostatic hypotension -Patient is now hypertensive -Continue Florinef -tapering down    Cultures; Blood Cultures x 2 >>  NGT Urine Culture  >>> NGT   Antimicrobials:     Consultants: Urologist Dr. Bernardo Heater   ------------------------------------------------------------------------------------------------------------------------------------------------  DVT prophylaxis:  SCD/Compression stockings Code Status:   Code Status: Full Code Family Communication: No family member present at bedside- attempt will be made to update daily The above findings and plan of care has been discussed with patient (and family )  in detail,  they expressed understanding and  agreement of above. -Advance care planning has been discussed.   Admission status:    Status is: Inpatient  Remains inpatient appropriate because:Inpatient level of care appropriate due to severity of illness   Dispo: The patient is from: Home              Anticipated d/c is to: Home              Anticipated d/c date is: 2 days              Patient currently is not medically stable to d/c.        Procedures:   No admission procedures for hospital encounter.     Antimicrobials:  Anti-infectives (From admission, onward)   None       Medication:  . amLODipine  2.5 mg Oral Daily  . aspirin EC  81 mg Oral Daily  . atorvastatin  80 mg Oral q1800  . escitalopram  10 mg Oral Daily  . ezetimibe  10 mg Oral Daily  . [START ON 06/14/2020] fludrocortisone  0.05 mg Oral Once  . [START ON 06/13/2020] fludrocortisone  0.1 mg Oral Daily  . [START ON 06/13/2020] influenza vac split quadrivalent PF  0.5 mL Intramuscular Tomorrow-1000  . isosorbide mononitrate  30 mg Oral Daily  . mometasone-formoterol  2 puff Inhalation BID  . multivitamin with minerals  1 tablet Oral Daily  . nicotine  21 mg Transdermal Daily  . omega-3 acid ethyl esters  1 g Oral Daily  . pantoprazole (PROTONIX) IV  40 mg Intravenous Q12H  . potassium chloride  40 mEq Oral Once    acetaminophen, albuterol, hydrALAZINE, morphine injection, nitroGLYCERIN, ondansetron (ZOFRAN) IV, oxyCODONE-acetaminophen, senna-docusate   Objective:   Vitals:   06/11/20 2013 06/12/20 0049 06/12/20 0412 06/12/20 0828  BP: 123/64 108/70 103/74 130/80  Pulse: 65 74 79 63  Resp: 20 20 20 18   Temp: 98.3 F (36.8 C) 99.3 F (37.4 C) 99 F (37.2 C) 98.4 F (36.9 C)  TempSrc: Oral Oral Oral Oral  SpO2: 93% 91% 93% 93%  Weight: 58.2 kg     Height: 5\' 5"  (1.651 m)       Intake/Output Summary (Last 24 hours) at 06/12/2020 1235 Last data filed at 06/12/2020 1047 Gross per 24 hour  Intake 300 ml  Output 800 ml  Net -500  ml   Filed Weights   06/11/20 1043 06/11/20 2013  Weight: 59 kg 58.2 kg     Examination:   Physical Exam  Constitution:  Alert, cooperative, no distress,  Appears calm and comfortable  Psychiatric: Normal and stable mood and affect, cognition intact,   HEENT: Normocephalic, PERRL, otherwise with in Normal limits  Chest:Chest symmetric Cardio vascular:  S1/S2, RRR, No murmure, No Rubs or Gallops  pulmonary: Clear to auscultation bilaterally, respirations unlabored, negative wheezes / crackles Abdomen: Soft, non-tender, non-distended, bowel sounds,no masses, no organomegaly Muscular skeletal: Limited exam - in bed, able to move all 4 extremities, Normal strength, Left flank/CVA tenderness Neuro: CNII-XII intact. , normal motor and sensation, reflexes intact  Extremities: No pitting edema lower extremities, +2 pulses  Skin: Dry, warm to touch, negative for any Rashes, No open wounds Wounds: per nursing documentation    ------------------------------------------------------------------------------------------------------------------------------------------    LABs:  CBC Latest Ref Rng & Units 06/12/2020 06/12/2020 06/11/2020  WBC 4.0 - 10.5 K/uL 14.5(H) 13.9(H) 15.8(H)  Hemoglobin 13.0 - 17.0 g/dL 11.8(L) 11.6(L) 11.9(L)  Hematocrit 39 - 52 % 34.0(L) 34.4(L) 34.8(L)  Platelets 150 - 400 K/uL 174 172 178   CMP Latest Ref Rng & Units 06/12/2020 06/11/2020 06/03/2020  Glucose 70 - 99 mg/dL 96 136(H) 76  BUN 6 - 20 mg/dL 18 18 12   Creatinine 0.61 - 1.24 mg/dL 1.35(H) 1.25(H) 0.77  Sodium 135 - 145 mmol/L 140 144 143  Potassium 3.5 - 5.1 mmol/L 3.1(L) 3.5 2.9(L)  Chloride 98 - 111 mmol/L 107 106 104  CO2 22 - 32 mmol/L 25 27 30   Calcium 8.9 - 10.3 mg/dL 8.1(L) 9.5 8.8(L)  Total Protein 6.5 - 8.1 g/dL - - -  Total Bilirubin 0.3 - 1.2 mg/dL - - -  Alkaline Phos 38 - 126 U/L - - -  AST 15 - 41 U/L - - -  ALT 0 - 44 U/L - - -       Micro Results Recent Results (from the past  240 hour(s))  Microscopic Examination     Status: Abnormal   Collection Time: 06/05/20  3:47 PM   Urine  Result Value Ref Range Status   WBC, UA 0-5 0 - 5 /hpf Final   RBC 11-30 (A) 0 - 2 /hpf Final   Epithelial Cells (non renal) 0-10 0 - 10 /hpf Final   Casts Present (A) None seen /lpf Final   Cast Type Granular casts (A) N/A Final   Crystals Present (A) N/A Final   Crystal Type Amorphous Sediment N/A Final   Bacteria, UA None seen None seen/Few Final  CULTURE, URINE COMPREHENSIVE     Status: None   Collection Time: 06/05/20  4:34 PM   Specimen: Urine   Urine  Result Value Ref Range Status   Urine Culture, Comprehensive Final report  Final   Organism ID, Bacteria Comment  Final    Comment: Mixed urogenital flora 10,000-25,000 colony forming units per mL   Microscopic Examination     Status: Abnormal   Collection Time: 06/10/20  3:47 PM   Urine  Result Value Ref Range Status   WBC, UA 0-5 0 - 5 /hpf Final   RBC >30 (A) 0 - 2 /hpf Final   Epithelial Cells (non renal) 0-10 0 - 10 /hpf Final   Casts Present (A) None seen /lpf Final   Cast Type Granular casts (A) N/A Final   Bacteria, UA Few None seen/Few Final  SARS CORONAVIRUS 2 (TAT 6-24 HRS) Nasopharyngeal Nasopharyngeal Swab     Status: None   Collection Time: 06/11/20 11:17 AM   Specimen: Nasopharyngeal Swab  Result Value Ref Range Status   SARS Coronavirus 2 NEGATIVE NEGATIVE Final    Comment: (NOTE) SARS-CoV-2 target nucleic acids are NOT DETECTED.  The SARS-CoV-2 RNA is generally detectable in upper and lower respiratory specimens during the acute phase of infection. Negative results do not preclude SARS-CoV-2 infection, do not rule out co-infections with other pathogens, and should not be used as the sole basis for treatment or other patient management decisions. Negative results must be combined with clinical observations, patient history, and epidemiological information. The expected result is Negative.  Fact  Sheet for Patients:  SugarRoll.be  Fact Sheet for Healthcare Providers: https://www.woods-mathews.com/  This test is not yet approved or cleared by the [place] FDA and  has been authorized for detection and/or diagnosis of SARS-CoV-2 by FDA under an Emergency Use Authorization (EUA). This EUA will remain  in effect (meaning this test can be used) for the duration of the COVID-19 declaration under Se ction 564(b)(1) of the Act, 21 U.S.C. section 360bbb-3(b)(1), unless the authorization is terminated or revoked sooner.  Performed at [facility], [place] [address] [place], [place] [address]   Respiratory Panel by RT PCR (Flu A&B, Covid) - Nasopharyngeal Swab     Status: None   Collection Time: 06/11/20  3:05 PM   Specimen: Nasopharyngeal Swab  Result Value Ref Range Status   SARS Coronavirus 2 by RT PCR NEGATIVE NEGATIVE Final    Comment: (NOTE) SARS-CoV-2 target nucleic acids are NOT DETECTED.  The SARS-CoV-2 RNA is generally detectable in upper respiratoy specimens during the acute phase of infection. The lowest concentration of SARS-CoV-2 viral copies this assay can detect is 131 copies/mL. A negative result does not preclude SARS-Cov-2 infection and should not be used as the sole basis for treatment or other patient management decisions. A negative result may occur with  improper specimen collection/handling, submission of specimen other than nasopharyngeal swab, presence of viral mutation(s) within the areas targeted by this assay, and inadequate number of viral copies (<131 copies/mL). A negative result must be combined with clinical observations, patient history, and epidemiological information. The expected result is Negative.  Fact Sheet for Patients:  PinkCheek.be  Fact Sheet for Healthcare Providers:  GravelBags.it  This test is no t yet approved or cleared by  the [place] FDA and  has been authorized for detection and/or diagnosis of SARS-CoV-2 by FDA under an Emergency Use Authorization (EUA). This EUA will remain  in effect (meaning this test can be used) for the duration of the COVID-19 declaration under Section 564(b)(1) of the Act, 21 U.S.C. section 360bbb-3(b)(1), unless the authorization is terminated or revoked sooner.     Influenza A by PCR NEGATIVE NEGATIVE Final   Influenza B by PCR NEGATIVE NEGATIVE Final    Comment: (NOTE) The Xpert Xpress SARS-CoV-2/FLU/RSV assay is intended as an aid in  the diagnosis of influenza from Nasopharyngeal swab specimens and  should not be used as a sole basis for treatment. Nasal washings and  aspirates are unacceptable for Xpert Xpress SARS-CoV-2/FLU/RSV  testing.  Fact Sheet for Patients: PinkCheek.be  Fact Sheet for Healthcare Providers: GravelBags.it  This test is not yet approved or cleared by the [place] FDA and  has been authorized for detection and/or diagnosis of SARS-CoV-2 by  FDA under an Emergency Use Authorization (EUA). This EUA will remain  in effect (meaning this test can be used) for the duration of the  Covid-19 declaration under Section 564(b)(1) of the Act, 21  U.S.C. section 360bbb-3(b)(1), unless the authorization is  terminated or revoked. Performed at [facility], [address] [place], [place] [address]   Culture, blood (Routine X 2) w Reflex to ID Panel     Status: None (Preliminary result)   Collection Time: 06/11/20  9:23 PM   Specimen: BLOOD  Result Value Ref Range Status   Specimen Description BLOOD LEFT AC  Final   Special Requests   Final    BOTTLES DRAWN AEROBIC AND ANAEROBIC Blood Culture adequate volume   Culture   Final    NO GROWTH <  12 HOURS Performed at [facility], [address] [place], [place] [address]    Report Status PENDING  Incomplete  Culture,  blood (Routine X 2) w Reflex to ID Panel     Status: None (Preliminary result)   Collection Time: 06/11/20  9:29 PM   Specimen: BLOOD  Result Value Ref Range Status   Specimen Description BLOOD LEFT FA  Final   Special Requests   Final    BOTTLES DRAWN AEROBIC AND ANAEROBIC Blood Culture adequate volume   Culture   Final    NO GROWTH < 12 HOURS Performed at [facility], [address] [place], [place] [address]    Report Status PENDING  Incomplete    Radiology Reports DG Abdomen 1 View  Result Date: 06/11/2020 CLINICAL DATA:  Flank pain EXAM: ABDOMEN - 1 VIEW COMPARISON:  CT abdomen and pelvis June 03, 2020 FINDINGS: There are three immediately adjacent calculi in the mid left kidney, each measuring 3 mm in size. There are apparent phleboliths in the pelvis. No other abnormal calcifications are evident. There is moderate stool in the colon. There is no bowel dilatation or air-fluid level to suggest bowel obstruction. No free air. IMPRESSION: There are 3 immediately adjacent calculi in the mid left kidney which correspond to the apparent larger appearing calculus in the mid left kidney on recent CT. There are apparent phleboliths in pelvis. No other abnormal calcifications evident. Moderate stool in colon.  No bowel obstruction or free air evident. Electronically Signed   By: Lowella Grip III M.D.   On: 06/11/2020 14:59   CT ABDOMEN PELVIS W CONTRAST  Result Date: 06/03/2020 CLINICAL DATA:  Hematuria and left lower quadrant abdominal pain. Dark stools. EXAM: CT ABDOMEN AND PELVIS WITH CONTRAST TECHNIQUE: Multidetector CT imaging of the abdomen and pelvis was performed using the standard protocol following bolus administration of intravenous contrast. CONTRAST:  37mL OMNIPAQUE IOHEXOL 300 MG/ML  SOLN COMPARISON:  Abdominopelvic CT 11/26/2017 FINDINGS: Lower chest: Dependent opacities in both lower lobes typical of atelectasis. No pleural fluid. The heart is normal in size.  Hepatobiliary: Borderline hepatic steatosis with mild diffusely decreased hepatic density. No focal hepatic lesion. Gallbladder physiologically distended, no calcified stone. No biliary dilatation. Pancreas: No ductal dilatation or inflammation. Spleen: Normal in size without focal abnormality. Adrenals/Urinary Tract: Normal adrenal glands. Nonobstructing 7 mm stone in the upper left kidney. No hydronephrosis. There is symmetric renal excretion on delayed phase imaging. Homogeneous renal enhancement. Multiple low-density lesions in the left kidney, largest measuring 2.3 cm consistent with simple cyst. Many of the additional lesions are too small to accurately characterize. No evidence of focal solid lesion. Urinary bladder is minimally distended. No bladder wall thickening or stone. No ureteral stone. Stomach/Bowel: Bowel evaluation is limited in the absence of enteric contrast and paucity of intra-abdominal fat. Stomach is partially distended, unremarkable. Occasional fluid-filled loops of small bowel without wall thickening, perienteric fat stranding, or obstruction. Cecum is slightly high-riding in the right mid abdomen. Appendix is not confidently visualized on the current exam. Moderate stool burden in the ascending and transverse colon. Descending and sigmoid colon are decompressed, limiting detailed assessment. There is no definite wall thickening. Vascular/Lymphatic: Moderate aorto bi-iliac atherosclerosis. No aortic aneurysm. The aortic branch vessels are patent. Portal vein is patent. No acute vascular findings. No bulky abdominopelvic adenopathy. Reproductive: Prostate is unremarkable. Other: No free air or free fluid.  No body wall hernia. Musculoskeletal: Degenerative change in the spine. Degenerative change in the right  hip. Stable area of sclerosis involving the right iliac bone, stable from 2019, likely benign. IMPRESSION: 1. Nonobstructing left renal stone. No hydronephrosis or obstructive uropathy.  2. Decompressed descending and sigmoid colon, limiting detailed assessment. No definite wall thickening or inflammatory change. 3. Borderline hepatic steatosis. Aortic Atherosclerosis (ICD10-I70.0). Electronically Signed   By: Keith Rake M.D.   On: 06/03/2020 17:29    SIGNED: Deatra James, MD, FACP, FHM. [facility],  Pager (please use [email or web address] to page/text)  If 7PM-7AM, please contact night-coverage [email or web address] 06/12/2020, 12:35 PM

## 2020-06-12 NOTE — H&P (View-Only) (Signed)
Urology Consult Follow Up  Subjective: Patient continues with intermittent left-sided flank pain controlled with pain medicine.    VSS afebrile  Serum creatinine 1.35 which is slightly increased from baseline.  WBC count is 13.9 which is improved from admission.  He is voiding well with some intermittent gross hematuria.  He denies any passage of fragments.  Anti-infectives: Anti-infectives (From admission, onward)   None      Current Facility-Administered Medications  Medication Dose Route Frequency Provider Last Rate Last Admin  . 0.9 %  sodium chloride infusion   Intravenous Continuous Ivor Costa, MD 75 mL/hr at 06/12/20 0622 New Bag at 06/12/20 0622  . acetaminophen (TYLENOL) tablet 650 mg  650 mg Oral Q6H PRN Ivor Costa, MD      . albuterol (VENTOLIN HFA) 108 (90 Base) MCG/ACT inhaler 2 puff  2 puff Inhalation Q4H PRN Ivor Costa, MD      . amLODipine (NORVASC) tablet 2.5 mg  2.5 mg Oral Daily Ivor Costa, MD   2.5 mg at 06/12/20 0831  . aspirin EC tablet 81 mg  81 mg Oral Daily Ivor Costa, MD   81 mg at 06/11/20 1705  . atorvastatin (LIPITOR) tablet 80 mg  80 mg Oral q1800 Ivor Costa, MD   80 mg at 06/11/20 1704  . escitalopram (LEXAPRO) tablet 10 mg  10 mg Oral Daily Ivor Costa, MD   10 mg at 06/12/20 0830  . ezetimibe (ZETIA) tablet 10 mg  10 mg Oral Daily Ivor Costa, MD   10 mg at 06/12/20 0830  . fludrocortisone (FLORINEF) tablet 200 mcg  200 mcg Oral Daily Ivor Costa, MD   200 mcg at 06/12/20 0830  . hydrALAZINE (APRESOLINE) injection 5 mg  5 mg Intravenous Q2H PRN Ivor Costa, MD      . Derrill Memo ON 06/13/2020] influenza vac split quadrivalent PF (FLUARIX) injection 0.5 mL  0.5 mL Intramuscular Tomorrow-1000 Ivor Costa, MD      . isosorbide mononitrate (IMDUR) 24 hr tablet 30 mg  30 mg Oral Daily Ivor Costa, MD   30 mg at 06/12/20 0830  . mometasone-formoterol (DULERA) 200-5 MCG/ACT inhaler 2 puff  2 puff Inhalation BID Ivor Costa, MD   2 puff at 06/12/20 0829  . morphine 2  MG/ML injection 2 mg  2 mg Intravenous Q3H PRN Ivor Costa, MD   2 mg at 06/11/20 1706  . multivitamin with minerals tablet 1 tablet  1 tablet Oral Daily Ivor Costa, MD   1 tablet at 06/12/20 0830  . nicotine (NICODERM CQ - dosed in mg/24 hours) patch 21 mg  21 mg Transdermal Daily Ivor Costa, MD      . nitroGLYCERIN (NITROSTAT) SL tablet 0.4 mg  0.4 mg Sublingual Q5 min PRN Ivor Costa, MD      . omega-3 acid ethyl esters (LOVAZA) capsule 1 g  1 g Oral Daily Ivor Costa, MD   1 g at 06/12/20 0831  . ondansetron (ZOFRAN) injection 4 mg  4 mg Intravenous Q8H PRN Ivor Costa, MD   4 mg at 06/11/20 1704  . oxyCODONE-acetaminophen (PERCOCET/ROXICET) 5-325 MG per tablet 1 tablet  1 tablet Oral Q4H PRN Ivor Costa, MD   1 tablet at 06/12/20 0620  . pantoprazole (PROTONIX) injection 40 mg  40 mg Intravenous Q12H Ivor Costa, MD   40 mg at 06/12/20 0829  . senna-docusate (Senokot-S) tablet 1 tablet  1 tablet Oral QHS PRN Ivor Costa, MD         Objective: Vital  signs in last 24 hours: Temp:  [97.7 F (36.5 C)-99.3 F (37.4 C)] 98.4 F (36.9 C) (10/13 0828) Pulse Rate:  [62-79] 63 (10/13 0828) Resp:  [18-20] 18 (10/13 0828) BP: (103-160)/(64-93) 130/80 (10/13 0828) SpO2:  [86 %-96 %] 93 % (10/13 0828) Weight:  [58.2 kg-59 kg] 58.2 kg (10/12 2013)  Intake/Output from previous day: 10/12 0701 - 10/13 0700 In: 300 [P.O.:300] Out: 600 [Urine:600] Intake/Output this shift: No intake/output data recorded.   Physical Exam Constitutional:  Well nourished. Alert and oriented, No acute distress. HEENT: [place] AT, moist mucus membranes.  Trachea midline Cardiovascular: No clubbing, cyanosis, or edema. Respiratory: Normal respiratory effort, no increased work of breathing. GI: Abdomen is soft, non tender, non distended, no abdominal masses. GU: Mild left CVA tenderness.  No bladder fullness or masses.   Neurologic: Grossly intact, no focal deficits, moving all 4 extremities. Psychiatric: Normal mood and  affect.  Lab Results:  Recent Labs    06/12/20 0033 06/12/20 0549  WBC 13.9* 14.5*  HGB 11.6* 11.8*  HCT 34.4* 34.0*  PLT 172 174   BMET Recent Labs    06/11/20 1059 06/12/20 0549  NA 144 140  K 3.5 3.1*  CL 106 107  CO2 27 25  GLUCOSE 136* 96  BUN 18 18  CREATININE 1.25* 1.35*  CALCIUM 9.5 8.1*   PT/INR Recent Labs    06/11/20 2116  LABPROT 13.2  INR 1.0   ABG No results for input(s): PHART, HCO3 in the last 72 hours.  Invalid input(s): PCO2, PO2  Studies/Results: DG Abdomen 1 View  Result Date: 06/11/2020 CLINICAL DATA:  Flank pain EXAM: ABDOMEN - 1 VIEW COMPARISON:  CT abdomen and pelvis June 03, 2020 FINDINGS: There are three immediately adjacent calculi in the mid left kidney, each measuring 3 mm in size. There are apparent phleboliths in the pelvis. No other abnormal calcifications are evident. There is moderate stool in the colon. There is no bowel dilatation or air-fluid level to suggest bowel obstruction. No free air. IMPRESSION: There are 3 immediately adjacent calculi in the mid left kidney which correspond to the apparent larger appearing calculus in the mid left kidney on recent CT. There are apparent phleboliths in pelvis. No other abnormal calcifications evident. Moderate stool in colon.  No bowel obstruction or free air evident. Electronically Signed   By: Lowella Grip III M.D.   On: 06/11/2020 14:59     Assessment and Plan: 59 year old male with left nephrolithiasis who was admitted for uncontrollable left flank pain, nausea and vomiting.  He remains stable and afebrile.    Recommendations: Patient is scheduled tomorrow for left ureteroscopy with holmium laser lithotripsy and ureteral stent placement Okay for diet today.  Please resume n.p.o. after midnight tonight Continue with pain control, IV fluids and supportive care     LOS: 1 day    [facility] 06/12/2020

## 2020-06-12 NOTE — Anesthesia Preprocedure Evaluation (Addendum)
Anesthesia Evaluation  Patient identified by MRN, date of birth, ID band Patient awake    Reviewed: Allergy & Precautions, H&P , NPO status , reviewed documented beta blocker date and time   Airway Mallampati: II  TM Distance: >3 FB Neck ROM: full    Dental  (+) Missing   Pulmonary COPD, Current Smoker and Patient abstained from smoking.,    Pulmonary exam normal        Cardiovascular + CAD and + Past MI  Normal cardiovascular exam  12/2019 Nuclear Scan Pharmacological myocardial perfusion imaging study with no significant  ischemia Normal wall motion, EF estimated at 65% No EKG changes concerning for ischemia at peak stress or in recovery. CT attenuation correction images with mild coronary calcification in the LAD, no significant aortic atherosclerosis Low risk scan   No cardiac sx's since placed on oral Ca blocker   Neuro/Psych PSYCHIATRIC DISORDERS Anxiety Depression  Neuromuscular disease    GI/Hepatic neg GERD  ,  Endo/Other    Renal/GU Renal disease     Musculoskeletal  (+) Arthritis ,   Abdominal   Peds  Hematology   Anesthesia Other Findings Past Medical History: No date: Allergy No date: Anxiety No date: BPH (benign prostatic hyperplasia) No date: COPD (chronic obstructive pulmonary disease) (HCC) No date: Coronary vasospasm ([place])     Comment:  a. 03/2019 Cath: RCA 66m but only 40% after intracoronary              ntg-->Imdur started. No date: History of echocardiogram     Comment:  a. 03/2019 Echo: EF 50-55%, nl RV size/fxn. Trace MR/TR. No date: Non-obstructive CAD (coronary artery disease)     Comment:  a. 03/2013 Cath: min irregs. EF >55%; b. 03/2019 Cath: LM               nl, LAD nl, LCX nl, OM1 40, RCA 30p, 60m-->40% after IC               NTG. No date: OA (osteoarthritis) No date: Tobacco abuse   Reproductive/Obstetrics                         Anesthesia  Physical Anesthesia Plan  ASA: III  Anesthesia Plan: General   Post-op Pain Management:    Induction: Intravenous  PONV Risk Score and Plan: Ondansetron and Treatment may vary due to age or medical condition  Airway Management Planned: Oral ETT  Additional Equipment:   Intra-op Plan:   Post-operative Plan: Extubation in OR  Informed Consent: I have reviewed the patients History and Physical, chart, labs and discussed the procedure including the risks, benefits and alternatives for the proposed anesthesia with the patient or authorized representative who has indicated his/her understanding and acceptance.     Dental Advisory Given  Plan Discussed with: CRNA  Anesthesia Plan Comments:         Anesthesia Quick Evaluation

## 2020-06-12 NOTE — H&P (View-Only) (Signed)
Urology Consult Follow Up  Subjective: Patient continues with intermittent left-sided flank pain controlled with pain medicine.    VSS afebrile  Serum creatinine 1.35 which is slightly increased from baseline.  WBC count is 13.9 which is improved from admission.  He is voiding well with some intermittent gross hematuria.  He denies any passage of fragments.  Anti-infectives: Anti-infectives (From admission, onward)   None      Current Facility-Administered Medications  Medication Dose Route Frequency Provider Last Rate Last Admin   0.9 %  sodium chloride infusion   Intravenous Continuous Ivor Costa, MD 75 mL/hr at 06/12/20 0622 New Bag at 06/12/20 0622   acetaminophen (TYLENOL) tablet 650 mg  650 mg Oral Q6H PRN Ivor Costa, MD       albuterol (VENTOLIN HFA) 108 (90 Base) MCG/ACT inhaler 2 puff  2 puff Inhalation Q4H PRN Ivor Costa, MD       amLODipine (NORVASC) tablet 2.5 mg  2.5 mg Oral Daily Ivor Costa, MD   2.5 mg at 06/12/20 0831   aspirin EC tablet 81 mg  81 mg Oral Daily Ivor Costa, MD   81 mg at 06/11/20 1705   atorvastatin (LIPITOR) tablet 80 mg  80 mg Oral q1800 Ivor Costa, MD   80 mg at 06/11/20 1704   escitalopram (LEXAPRO) tablet 10 mg  10 mg Oral Daily Ivor Costa, MD   10 mg at 06/12/20 0830   ezetimibe (ZETIA) tablet 10 mg  10 mg Oral Daily Ivor Costa, MD   10 mg at 06/12/20 0830   fludrocortisone (FLORINEF) tablet 200 mcg  200 mcg Oral Daily Ivor Costa, MD   200 mcg at 06/12/20 0830   hydrALAZINE (APRESOLINE) injection 5 mg  5 mg Intravenous Q2H PRN Ivor Costa, MD       [START ON 06/13/2020] influenza vac split quadrivalent PF (FLUARIX) injection 0.5 mL  0.5 mL Intramuscular Tomorrow-1000 Ivor Costa, MD       isosorbide mononitrate (IMDUR) 24 hr tablet 30 mg  30 mg Oral Daily Ivor Costa, MD   30 mg at 06/12/20 0830   mometasone-formoterol (DULERA) 200-5 MCG/ACT inhaler 2 puff  2 puff Inhalation BID Ivor Costa, MD   2 puff at 06/12/20 0829   morphine 2  MG/ML injection 2 mg  2 mg Intravenous Q3H PRN Ivor Costa, MD   2 mg at 06/11/20 1706   multivitamin with minerals tablet 1 tablet  1 tablet Oral Daily Ivor Costa, MD   1 tablet at 06/12/20 0830   nicotine (NICODERM CQ - dosed in mg/24 hours) patch 21 mg  21 mg Transdermal Daily Ivor Costa, MD       nitroGLYCERIN (NITROSTAT) SL tablet 0.4 mg  0.4 mg Sublingual Q5 min PRN Ivor Costa, MD       omega-3 acid ethyl esters (LOVAZA) capsule 1 g  1 g Oral Daily Ivor Costa, MD   1 g at 06/12/20 0831   ondansetron (ZOFRAN) injection 4 mg  4 mg Intravenous Q8H PRN Ivor Costa, MD   4 mg at 06/11/20 1704   oxyCODONE-acetaminophen (PERCOCET/ROXICET) 5-325 MG per tablet 1 tablet  1 tablet Oral Q4H PRN Ivor Costa, MD   1 tablet at 06/12/20 0620   pantoprazole (PROTONIX) injection 40 mg  40 mg Intravenous Q12H Ivor Costa, MD   40 mg at 06/12/20 4315   senna-docusate (Senokot-S) tablet 1 tablet  1 tablet Oral QHS PRN Ivor Costa, MD         Objective: Vital  signs in last 24 hours: Temp:  [97.7 F (36.5 C)-99.3 F (37.4 C)] 98.4 F (36.9 C) (10/13 0828) Pulse Rate:  [62-79] 63 (10/13 0828) Resp:  [18-20] 18 (10/13 0828) BP: (103-160)/(64-93) 130/80 (10/13 0828) SpO2:  [86 %-96 %] 93 % (10/13 0828) Weight:  [58.2 kg-59 kg] 58.2 kg (10/12 2013)  Intake/Output from previous day: 10/12 0701 - 10/13 0700 In: 300 [P.O.:300] Out: 600 [Urine:600] Intake/Output this shift: No intake/output data recorded.   Physical Exam Constitutional:  Well nourished. Alert and oriented, No acute distress. HEENT: [place] AT, moist mucus membranes.  Trachea midline Cardiovascular: No clubbing, cyanosis, or edema. Respiratory: Normal respiratory effort, no increased work of breathing. GI: Abdomen is soft, non tender, non distended, no abdominal masses. GU: Mild left CVA tenderness.  No bladder fullness or masses.   Neurologic: Grossly intact, no focal deficits, moving all 4 extremities. Psychiatric: Normal mood and  affect.  Lab Results:  Recent Labs    06/12/20 0033 06/12/20 0549  WBC 13.9* 14.5*  HGB 11.6* 11.8*  HCT 34.4* 34.0*  PLT 172 174   BMET Recent Labs    06/11/20 1059 06/12/20 0549  NA 144 140  K 3.5 3.1*  CL 106 107  CO2 27 25  GLUCOSE 136* 96  BUN 18 18  CREATININE 1.25* 1.35*  CALCIUM 9.5 8.1*   PT/INR Recent Labs    06/11/20 2116  LABPROT 13.2  INR 1.0   ABG No results for input(s): PHART, HCO3 in the last 72 hours.  Invalid input(s): PCO2, PO2  Studies/Results: DG Abdomen 1 View  Result Date: 06/11/2020 CLINICAL DATA:  Flank pain EXAM: ABDOMEN - 1 VIEW COMPARISON:  CT abdomen and pelvis June 03, 2020 FINDINGS: There are three immediately adjacent calculi in the mid left kidney, each measuring 3 mm in size. There are apparent phleboliths in the pelvis. No other abnormal calcifications are evident. There is moderate stool in the colon. There is no bowel dilatation or air-fluid level to suggest bowel obstruction. No free air. IMPRESSION: There are 3 immediately adjacent calculi in the mid left kidney which correspond to the apparent larger appearing calculus in the mid left kidney on recent CT. There are apparent phleboliths in pelvis. No other abnormal calcifications evident. Moderate stool in colon.  No bowel obstruction or free air evident. Electronically Signed   By: Lowella Grip III M.D.   On: 06/11/2020 14:59     Assessment and Plan: 59 year old male with left nephrolithiasis who was admitted for uncontrollable left flank pain, nausea and vomiting.  He remains stable and afebrile.    Recommendations: Patient is scheduled tomorrow for left ureteroscopy with holmium laser lithotripsy and ureteral stent placement Okay for diet today.  Please resume n.p.o. after midnight tonight Continue with pain control, IV fluids and supportive care     LOS: 1 day    [facility] 06/12/2020

## 2020-06-12 NOTE — Progress Notes (Signed)
Urology Consult Follow Up  Subjective: Patient continues with intermittent left-sided flank pain controlled with pain medicine.    VSS afebrile  Serum creatinine 1.35 which is slightly increased from baseline.  WBC count is 13.9 which is improved from admission.  He is voiding well with some intermittent gross hematuria.  He denies any passage of fragments.  Anti-infectives: Anti-infectives (From admission, onward)   None      Current Facility-Administered Medications  Medication Dose Route Frequency Provider Last Rate Last Admin  . 0.9 %  sodium chloride infusion   Intravenous Continuous Ivor Costa, MD 75 mL/hr at 06/12/20 0622 New Bag at 06/12/20 0622  . acetaminophen (TYLENOL) tablet 650 mg  650 mg Oral Q6H PRN Ivor Costa, MD      . albuterol (VENTOLIN HFA) 108 (90 Base) MCG/ACT inhaler 2 puff  2 puff Inhalation Q4H PRN Ivor Costa, MD      . amLODipine (NORVASC) tablet 2.5 mg  2.5 mg Oral Daily Ivor Costa, MD   2.5 mg at 06/12/20 0831  . aspirin EC tablet 81 mg  81 mg Oral Daily Ivor Costa, MD   81 mg at 06/11/20 1705  . atorvastatin (LIPITOR) tablet 80 mg  80 mg Oral q1800 Ivor Costa, MD   80 mg at 06/11/20 1704  . escitalopram (LEXAPRO) tablet 10 mg  10 mg Oral Daily Ivor Costa, MD   10 mg at 06/12/20 0830  . ezetimibe (ZETIA) tablet 10 mg  10 mg Oral Daily Ivor Costa, MD   10 mg at 06/12/20 0830  . fludrocortisone (FLORINEF) tablet 200 mcg  200 mcg Oral Daily Ivor Costa, MD   200 mcg at 06/12/20 0830  . hydrALAZINE (APRESOLINE) injection 5 mg  5 mg Intravenous Q2H PRN Ivor Costa, MD      . Derrill Memo ON 06/13/2020] influenza vac split quadrivalent PF (FLUARIX) injection 0.5 mL  0.5 mL Intramuscular Tomorrow-1000 Ivor Costa, MD      . isosorbide mononitrate (IMDUR) 24 hr tablet 30 mg  30 mg Oral Daily Ivor Costa, MD   30 mg at 06/12/20 0830  . mometasone-formoterol (DULERA) 200-5 MCG/ACT inhaler 2 puff  2 puff Inhalation BID Ivor Costa, MD   2 puff at 06/12/20 0829  . morphine 2  MG/ML injection 2 mg  2 mg Intravenous Q3H PRN Ivor Costa, MD   2 mg at 06/11/20 1706  . multivitamin with minerals tablet 1 tablet  1 tablet Oral Daily Ivor Costa, MD   1 tablet at 06/12/20 0830  . nicotine (NICODERM CQ - dosed in mg/24 hours) patch 21 mg  21 mg Transdermal Daily Ivor Costa, MD      . nitroGLYCERIN (NITROSTAT) SL tablet 0.4 mg  0.4 mg Sublingual Q5 min PRN Ivor Costa, MD      . omega-3 acid ethyl esters (LOVAZA) capsule 1 g  1 g Oral Daily Ivor Costa, MD   1 g at 06/12/20 0831  . ondansetron (ZOFRAN) injection 4 mg  4 mg Intravenous Q8H PRN Ivor Costa, MD   4 mg at 06/11/20 1704  . oxyCODONE-acetaminophen (PERCOCET/ROXICET) 5-325 MG per tablet 1 tablet  1 tablet Oral Q4H PRN Ivor Costa, MD   1 tablet at 06/12/20 0620  . pantoprazole (PROTONIX) injection 40 mg  40 mg Intravenous Q12H Ivor Costa, MD   40 mg at 06/12/20 0829  . senna-docusate (Senokot-S) tablet 1 tablet  1 tablet Oral QHS PRN Ivor Costa, MD         Objective: Vital  signs in last 24 hours: Temp:  [97.7 F (36.5 C)-99.3 F (37.4 C)] 98.4 F (36.9 C) (10/13 0828) Pulse Rate:  [62-79] 63 (10/13 0828) Resp:  [18-20] 18 (10/13 0828) BP: (103-160)/(64-93) 130/80 (10/13 0828) SpO2:  [86 %-96 %] 93 % (10/13 0828) Weight:  [58.2 kg-59 kg] 58.2 kg (10/12 2013)  Intake/Output from previous day: 10/12 0701 - 10/13 0700 In: 300 [P.O.:300] Out: 600 [Urine:600] Intake/Output this shift: No intake/output data recorded.   Physical Exam Constitutional:  Well nourished. Alert and oriented, No acute distress. HEENT: [place] AT, moist mucus membranes.  Trachea midline Cardiovascular: No clubbing, cyanosis, or edema. Respiratory: Normal respiratory effort, no increased work of breathing. GI: Abdomen is soft, non tender, non distended, no abdominal masses. GU: Mild left CVA tenderness.  No bladder fullness or masses.   Neurologic: Grossly intact, no focal deficits, moving all 4 extremities. Psychiatric: Normal mood and  affect.  Lab Results:  Recent Labs    06/12/20 0033 06/12/20 0549  WBC 13.9* 14.5*  HGB 11.6* 11.8*  HCT 34.4* 34.0*  PLT 172 174   BMET Recent Labs    06/11/20 1059 06/12/20 0549  NA 144 140  K 3.5 3.1*  CL 106 107  CO2 27 25  GLUCOSE 136* 96  BUN 18 18  CREATININE 1.25* 1.35*  CALCIUM 9.5 8.1*   PT/INR Recent Labs    06/11/20 2116  LABPROT 13.2  INR 1.0   ABG No results for input(s): PHART, HCO3 in the last 72 hours.  Invalid input(s): PCO2, PO2  Studies/Results: DG Abdomen 1 View  Result Date: 06/11/2020 CLINICAL DATA:  Flank pain EXAM: ABDOMEN - 1 VIEW COMPARISON:  CT abdomen and pelvis June 03, 2020 FINDINGS: There are three immediately adjacent calculi in the mid left kidney, each measuring 3 mm in size. There are apparent phleboliths in the pelvis. No other abnormal calcifications are evident. There is moderate stool in the colon. There is no bowel dilatation or air-fluid level to suggest bowel obstruction. No free air. IMPRESSION: There are 3 immediately adjacent calculi in the mid left kidney which correspond to the apparent larger appearing calculus in the mid left kidney on recent CT. There are apparent phleboliths in pelvis. No other abnormal calcifications evident. Moderate stool in colon.  No bowel obstruction or free air evident. Electronically Signed   By: Lowella Grip III M.D.   On: 06/11/2020 14:59     Assessment and Plan: 59 year old male with left nephrolithiasis who was admitted for uncontrollable left flank pain, nausea and vomiting.  He remains stable and afebrile.    Recommendations: Patient is scheduled tomorrow for left ureteroscopy with holmium laser lithotripsy and ureteral stent placement Okay for diet today.  Please resume n.p.o. after midnight tonight Continue with pain control, IV fluids and supportive care     LOS: 1 day    [facility] 06/12/2020

## 2020-06-13 ENCOUNTER — Encounter: Admission: RE | Payer: Self-pay | Source: Home / Self Care

## 2020-06-13 ENCOUNTER — Observation Stay: Payer: 59 | Admitting: Anesthesiology

## 2020-06-13 ENCOUNTER — Encounter
Admission: EM | Disposition: A | Discharge status: Home / Self Care | Payer: Self-pay | Source: Home / Self Care | Attending: Emergency Medicine

## 2020-06-13 ENCOUNTER — Observation Stay: Payer: 59

## 2020-06-13 ENCOUNTER — Ambulatory Visit: Admission: RE | Admit: 2020-06-13 | Payer: 59 | Source: Home / Self Care | Admitting: Urology

## 2020-06-13 DIAGNOSIS — R1032 Left lower quadrant pain: Secondary | ICD-10-CM

## 2020-06-13 DIAGNOSIS — N200 Calculus of kidney: Secondary | ICD-10-CM

## 2020-06-13 DIAGNOSIS — N132 Hydronephrosis with renal and ureteral calculous obstruction: Secondary | ICD-10-CM | POA: Diagnosis not present

## 2020-06-13 HISTORY — PX: CYSTOSCOPY/URETEROSCOPY/HOLMIUM LASER/STENT PLACEMENT: SHX6546

## 2020-06-13 LAB — CBC
HCT: 33.1 % — ABNORMAL LOW (ref 39.0–52.0)
HCT: 34.1 % — ABNORMAL LOW (ref 39.0–52.0)
HCT: 37.3 % — ABNORMAL LOW (ref 39.0–52.0)
Hemoglobin: 11.3 g/dL — ABNORMAL LOW (ref 13.0–17.0)
Hemoglobin: 11.9 g/dL — ABNORMAL LOW (ref 13.0–17.0)
Hemoglobin: 12.6 g/dL — ABNORMAL LOW (ref 13.0–17.0)
MCH: 31.5 pg (ref 26.0–34.0)
MCH: 31.7 pg (ref 26.0–34.0)
MCH: 32.1 pg (ref 26.0–34.0)
MCHC: 33.8 g/dL (ref 30.0–36.0)
MCHC: 34.1 g/dL (ref 30.0–36.0)
MCHC: 34.9 g/dL (ref 30.0–36.0)
MCV: 91.9 fL (ref 80.0–100.0)
MCV: 93 fL (ref 80.0–100.0)
MCV: 93.3 fL (ref 80.0–100.0)
Platelets: 173 10*3/uL (ref 150–400)
Platelets: 177 10*3/uL (ref 150–400)
Platelets: 192 10*3/uL (ref 150–400)
RBC: 3.56 MIL/uL — ABNORMAL LOW (ref 4.22–5.81)
RBC: 3.71 MIL/uL — ABNORMAL LOW (ref 4.22–5.81)
RBC: 4 MIL/uL — ABNORMAL LOW (ref 4.22–5.81)
RDW: 16.3 % — ABNORMAL HIGH (ref 11.5–15.5)
RDW: 16.4 % — ABNORMAL HIGH (ref 11.5–15.5)
RDW: 16.6 % — ABNORMAL HIGH (ref 11.5–15.5)
WBC: 11.1 10*3/uL — ABNORMAL HIGH (ref 4.0–10.5)
WBC: 12.3 10*3/uL — ABNORMAL HIGH (ref 4.0–10.5)
WBC: 13.8 10*3/uL — ABNORMAL HIGH (ref 4.0–10.5)
nRBC: 0 % (ref 0.0–0.2)
nRBC: 0 % (ref 0.0–0.2)
nRBC: 0 % (ref 0.0–0.2)

## 2020-06-13 LAB — GLUCOSE, CAPILLARY: Glucose-Capillary: 88 mg/dL (ref 70–99)

## 2020-06-13 LAB — BASIC METABOLIC PANEL
Anion gap: 9 (ref 5–15)
BUN: 14 mg/dL (ref 6–20)
CO2: 26 mmol/L (ref 22–32)
Calcium: 8.2 mg/dL — ABNORMAL LOW (ref 8.9–10.3)
Chloride: 105 mmol/L (ref 98–111)
Creatinine, Ser: 1.02 mg/dL (ref 0.61–1.24)
GFR, Estimated: >60 mL/min (ref >60)
Glucose, Bld: 93 mg/dL (ref 70–99)
Potassium: 2.9 mmol/L — ABNORMAL LOW (ref 3.5–5.1)
Sodium: 140 mmol/L (ref 135–145)

## 2020-06-13 SURGERY — CYSTOSCOPY/URETEROSCOPY/HOLMIUM LASER/STENT PLACEMENT
Anesthesia: General | Laterality: Left

## 2020-06-13 SURGERY — LITHOTRIPSY, ESWL
Anesthesia: Moderate Sedation | Laterality: Left

## 2020-06-13 MED ORDER — ACETAMINOPHEN 10 MG/ML IV SOLN
INTRAVENOUS | Status: DC | PRN
  Administered 2020-06-13: 1000 mg via INTRAVENOUS

## 2020-06-13 MED ORDER — DIPHENHYDRAMINE HCL 25 MG PO CAPS
25.0000 mg | ORAL_CAPSULE | ORAL | Status: AC
  Administered 2020-06-13: 25 mg via ORAL

## 2020-06-13 MED ORDER — ROCURONIUM BROMIDE 100 MG/10ML IV SOLN
INTRAVENOUS | Status: DC | PRN
  Administered 2020-06-13: 40 mg via INTRAVENOUS
  Administered 2020-06-13: 10 mg via INTRAVENOUS

## 2020-06-13 MED ORDER — DIAZEPAM 5 MG PO TABS
10.0000 mg | ORAL_TABLET | ORAL | Status: AC
  Administered 2020-06-13: 10 mg via ORAL

## 2020-06-13 MED ORDER — HYDROCODONE-ACETAMINOPHEN 7.5-325 MG PO TABS: 1.0000 | ORAL_TABLET | Freq: Once | ORAL | Status: DC | PRN

## 2020-06-13 MED ORDER — EPHEDRINE SULFATE 50 MG/ML IJ SOLN
INTRAMUSCULAR | Status: DC | PRN
  Administered 2020-06-13: 10 mg via INTRAVENOUS

## 2020-06-13 MED ORDER — PROMETHAZINE HCL 25 MG/ML IJ SOLN: 6.2500 mg | INTRAMUSCULAR | Status: DC | PRN

## 2020-06-13 MED ORDER — DEXMEDETOMIDINE (PRECEDEX) IN NS 20 MCG/5ML (4 MCG/ML) IV SYRINGE
PREFILLED_SYRINGE | INTRAVENOUS | Status: DC | PRN
  Administered 2020-06-13: 8 ug via INTRAVENOUS

## 2020-06-13 MED ORDER — LIDOCAINE HCL (CARDIAC) PF 100 MG/5ML IV SOSY
PREFILLED_SYRINGE | INTRAVENOUS | Status: DC | PRN
  Administered 2020-06-13: 100 mg via INTRAVENOUS

## 2020-06-13 MED ORDER — ONDANSETRON HCL 4 MG/2ML IJ SOLN: 4.0000 mg | Freq: Once | INTRAMUSCULAR | Status: DC | PRN

## 2020-06-13 MED ORDER — GLYCOPYRROLATE 0.2 MG/ML IJ SOLN
INTRAMUSCULAR | Status: DC | PRN
  Administered 2020-06-13: .2 mg via INTRAVENOUS

## 2020-06-13 MED ORDER — FENTANYL CITRATE (PF) 100 MCG/2ML IJ SOLN: 25.0000 ug | INTRAMUSCULAR | Status: DC | PRN

## 2020-06-13 MED ORDER — FENTANYL CITRATE (PF) 100 MCG/2ML IJ SOLN
INTRAMUSCULAR | Status: DC | PRN
  Administered 2020-06-13 (×2): 50 ug via INTRAVENOUS

## 2020-06-13 MED ORDER — SODIUM CHLORIDE 0.9 % IV SOLN: INTRAVENOUS | Status: DC

## 2020-06-13 MED ORDER — ACETAMINOPHEN 325 MG PO TABS: 325.0000 mg | ORAL_TABLET | ORAL | Status: DC | PRN

## 2020-06-13 MED ORDER — TAMSULOSIN HCL 0.4 MG PO CAPS: 0.4000 mg | ORAL_CAPSULE | Freq: Every day | ORAL | 1 refills | Status: DC

## 2020-06-13 MED ORDER — DEXAMETHASONE SODIUM PHOSPHATE 10 MG/ML IJ SOLN
INTRAMUSCULAR | Status: DC | PRN
  Administered 2020-06-13: 10 mg via INTRAVENOUS

## 2020-06-13 MED ORDER — MIDAZOLAM HCL 2 MG/2ML IJ SOLN
INTRAMUSCULAR | Status: DC | PRN
  Administered 2020-06-13: 2 mg via INTRAVENOUS

## 2020-06-13 MED ORDER — ACETAMINOPHEN 160 MG/5ML PO SOLN
325.0000 mg | ORAL | Status: DC | PRN
  Filled 2020-06-13: qty 20.3

## 2020-06-13 MED ORDER — CEPHALEXIN 500 MG PO CAPS: 500.0000 mg | ORAL_CAPSULE | ORAL | Status: DC

## 2020-06-13 MED ORDER — PROPOFOL 10 MG/ML IV BOLUS
INTRAVENOUS | Status: DC | PRN
  Administered 2020-06-13: 150 mg via INTRAVENOUS

## 2020-06-13 MED ORDER — ONDANSETRON HCL 4 MG/2ML IJ SOLN
INTRAMUSCULAR | Status: DC | PRN
  Administered 2020-06-13: 4 mg via INTRAVENOUS

## 2020-06-13 MED ORDER — MEPERIDINE HCL 50 MG/ML IJ SOLN: 6.2500 mg | INTRAMUSCULAR | Status: DC | PRN

## 2020-06-13 SURGICAL SUPPLY — 31 items
BAG DRAIN CYSTO-URO LG1000N (MISCELLANEOUS) ×2 IMPLANT
BASKET ZERO TIP 1.9FR (BASKET) IMPLANT
BRUSH SCRUB EZ 1% IODOPHOR (MISCELLANEOUS) ×2 IMPLANT
BSKT STON RTRVL ZERO TP 1.9FR (BASKET)
CATH BEACON 5 .035 65 KMP TIP (CATHETERS) ×1 IMPLANT
CATH URET FLEX-TIP 2 LUMEN 10F (CATHETERS) ×1 IMPLANT
CATH URETL 5X70 OPEN END (CATHETERS) ×2 IMPLANT
CNTNR SPEC 2.5X3XGRAD LEK (MISCELLANEOUS)
CONT SPEC 4OZ STER OR WHT (MISCELLANEOUS)
CONT SPEC 4OZ STRL OR WHT (MISCELLANEOUS)
CONTAINER SPEC 2.5X3XGRAD LEK (MISCELLANEOUS) IMPLANT
DRAPE UTILITY 15X26 TOWEL STRL (DRAPES) ×2 IMPLANT
FIBER LASER TRACTIP 200 (UROLOGICAL SUPPLIES) ×2 IMPLANT
GLOVE BIO SURGEON STRL SZ 6.5 (GLOVE) ×2 IMPLANT
GOWN STRL REUS W/ TWL LRG LVL3 (GOWN DISPOSABLE) ×2 IMPLANT
GOWN STRL REUS W/TWL LRG LVL3 (GOWN DISPOSABLE) ×4
GUIDEWIRE GREEN .038 145CM (MISCELLANEOUS) ×2 IMPLANT
GUIDEWIRE STR DUAL SENSOR (WIRE) ×2 IMPLANT
INFUSOR MANOMETER BAG 3000ML (MISCELLANEOUS) ×2 IMPLANT
INTRODUCER DILATOR DOUBLE (INTRODUCER) IMPLANT
KIT TURNOVER CYSTO (KITS) ×2 IMPLANT
PACK CYSTO AR (MISCELLANEOUS) ×2 IMPLANT
SET CYSTO W/LG BORE CLAMP LF (SET/KITS/TRAYS/PACK) ×2 IMPLANT
SHEATH URETERAL 12FRX35CM (MISCELLANEOUS) ×1 IMPLANT
SOL .9 NS 3000ML IRR  AL (IV SOLUTION) ×2
SOL .9 NS 3000ML IRR AL (IV SOLUTION) ×1
SOL .9 NS 3000ML IRR UROMATIC (IV SOLUTION) ×1 IMPLANT
STENT URET 6FRX24 CONTOUR (STENTS) IMPLANT
STENT URET 6FRX26 CONTOUR (STENTS) ×1 IMPLANT
SURGILUBE 2OZ TUBE FLIPTOP (MISCELLANEOUS) ×2 IMPLANT
WATER STERILE IRR 1000ML POUR (IV SOLUTION) ×2 IMPLANT

## 2020-06-13 NOTE — Progress Notes (Signed)
[place] to be D/C'd home per MD order.  Discussed prescriptions and follow up appointments with the patient. Prescriptions given to patient, medication list explained in detail. Pt verbalized understanding.  Allergies as of 06/13/2020      Reactions   Other Nausea And Vomiting, Other (See Comments)   Quail eggs caused severe stomach pain and n/v      Medication List    STOP taking these medications   potassium chloride 10 MEQ tablet Commonly known as: KLOR-CON     TAKE these medications   albuterol 108 (90 Base) MCG/ACT inhaler Commonly known as: VENTOLIN HFA Inhale 2 puffs into the lungs every 6 (six) hours as needed for wheezing or shortness of breath.   amLODipine 2.5 MG tablet Commonly known as: NORVASC TAKE 1 TABLET BY MOUTH  DAILY   aspirin EC 81 MG tablet Take 81 mg by mouth daily.   atorvastatin 80 MG tablet Commonly known as: LIPITOR Take 1 tablet (80 mg total) by mouth daily at 6 PM.   budesonide-formoterol 160-4.5 MCG/ACT inhaler Commonly known as: SYMBICORT Inhale 2 puffs into the lungs 2 (two) times daily.   escitalopram 10 MG tablet Commonly known as: LEXAPRO TAKE 1 TABLET BY MOUTH  DAILY   ezetimibe 10 MG tablet Commonly known as: ZETIA TAKE 1 TABLET BY MOUTH  DAILY   Fish Oil 1000 MG Caps Take 1,000 mg by mouth daily.   fludrocortisone 0.1 MG tablet Commonly known as: FLORINEF TAKE 2 TABLETS BY MOUTH  DAILY   isosorbide mononitrate 60 MG 24 hr tablet Commonly known as: IMDUR Take 0.5 tablets (30 mg total) by mouth at bedtime.   isosorbide mononitrate 30 MG 24 hr tablet Commonly known as: IMDUR TAKE 1 TABLET BY MOUTH  DAILY   multivitamin with minerals Tabs tablet Take 1 tablet by mouth daily.   nicotine 21 mg/24hr patch Commonly known as: NICODERM CQ - dosed in mg/24 hours Place 1 patch (21 mg total) onto the skin daily.   nicotine polacrilex 2 MG gum Commonly known as: NICORETTE Take 1 each (2 mg total) by mouth every 4  (four) hours while awake.   nitroGLYCERIN 0.4 MG SL tablet Commonly known as: NITROSTAT DISSOLVE 1 TABLET UNDER THE TONGUE EVERY 5 MINUTES AS NEEDED FOR CHEST PAIN   ondansetron 4 MG tablet Commonly known as: Zofran Take 1 tablet (4 mg total) by mouth every 8 (eight) hours as needed.   oxyCODONE-acetaminophen 5-325 MG tablet Commonly known as: PERCOCET/ROXICET Take 1 tablet by mouth every 6 (six) hours as needed for severe pain.   tamsulosin 0.4 MG Caps capsule Commonly known as: FLOMAX Take 1 capsule (0.4 mg total) by mouth daily.       Vitals:   06/13/20 1228 06/13/20 1510  BP: 129/84 137/81  Pulse: 73 (!) 57  Resp: 20 18  Temp: 98 F (36.7 C) (!) 97.4 F (36.3 C)  SpO2: 95% 97%    Skin clean, dry and intact without evidence of skin break down, no evidence of skin tears noted. IV catheter discontinued intact. Site without signs and symptoms of complications. Dressing and pressure applied. Pt denies pain at this time. No complaints noted.  An After Visit Summary was printed and given to the patient. Patient escorted via [place], and D/C home via private auto.  Fuller Mandril, RN

## 2020-06-13 NOTE — Anesthesia Procedure Notes (Signed)
Procedure Name: Intubation Performed by: Fletcher-Harrison, Zakiah Beckerman, CRNA Pre-anesthesia Checklist: Patient identified, Emergency Drugs available, Suction available and Patient being monitored Patient Re-evaluated:Patient Re-evaluated prior to induction Oxygen Delivery Method: Circle system utilized Preoxygenation: Pre-oxygenation with 100% oxygen Induction Type: IV induction Ventilation: Mask ventilation without difficulty Laryngoscope Size: McGraph and 3 Grade View: Grade I Tube type: Oral Tube size: 7.0 mm Number of attempts: 1 Airway Equipment and Method: Stylet and Oral airway Placement Confirmation: ETT inserted through vocal cords under direct vision,  positive ETCO2,  breath sounds checked- equal and bilateral and CO2 detector Secured at: 21 cm Tube secured with: Tape Dental Injury: Teeth and Oropharynx as per pre-operative assessment        

## 2020-06-13 NOTE — Anesthesia Postprocedure Evaluation (Signed)
Anesthesia Post Note  Patient: Isaiah Rangel  Procedure(s) Performed: CYSTOSCOPY/URETEROSCOPY//STENT PLACEMENT (Left )  Patient location during evaluation: PACU Anesthesia Type: General Level of consciousness: awake and alert Pain management: pain level controlled Vital Signs Assessment: post-procedure vital signs reviewed and stable Respiratory status: spontaneous breathing and respiratory function stable Cardiovascular status: stable Anesthetic complications: no   No complications documented.   Last Vitals:  Vitals:   06/13/20 1000 06/13/20 1143  BP: 138/80 114/84  Pulse: 70   Resp: 20   Temp: 37.1 C 36.6 C  SpO2: 93%     Last Pain:  Vitals:   06/13/20 1143  TempSrc:   PainSc: 0-No pain                 Breawna Montenegro K

## 2020-06-13 NOTE — Transfer of Care (Signed)
Immediate Anesthesia Transfer of Care Note  Patient: Isaiah Rangel  Procedure(s) Performed: CYSTOSCOPY/URETEROSCOPY//STENT PLACEMENT (Left )  Patient Location: PACU  Anesthesia Type:General  Level of Consciousness: awake, alert , oriented and patient cooperative  Airway & Oxygen Therapy: Patient Spontanous Breathing and Patient connected to face mask oxygen  Post-op Assessment: Report given to RN and Post -op Vital signs reviewed and stable  Post vital signs: Reviewed and stable  Last Vitals:  Vitals Value Taken Time  BP 114/84 06/13/20 1143  Temp 36.6 C 06/13/20 1143  Pulse 79 06/13/20 1149  Resp 15 06/13/20 1149  SpO2 100 % 06/13/20 1149  Vitals shown include unvalidated device data.  Last Pain:  Vitals:   06/13/20 1143  TempSrc:   PainSc: 0-No pain      Patients Stated Pain Goal: 5 (76/15/18 3437)  Complications: No complications documented.

## 2020-06-13 NOTE — Progress Notes (Signed)
PROGRESS NOTE    Patient: Isaiah Rangel                            PCP: Owens Loffler, MD                    DOB: Apr 24, 1961            DOA: 06/11/2020 IOX:[id number]             DOS: 06/13/2020, 10:47 AM   LOS: 2 days   Date of Service: The patient was seen and examined on 06/13/2020  Subjective:   The patient was seen and examined this morning, stable still complaining of pain discomfort, with pain medication did help stating did not sleep well overnight. Has been n.p.o. for anticipation of surgical invention today  N.p.o.    Brief Narrative:   Isaiah Rangel is a 59 y.o. male with medical history significant of hypertension, hyperlipidemia, COPD, stroke, GERD, depression, tobacco abuse, CAD, coronary artery spasm, BPH, left kidney stone, who presents with left flank pain. Admitted for left renal nephrolithiasis -hematuria  Patient had CT scan of abdomen/pelvis, which showed nonobstructive 7 mm of left kidney stone.   Per patient was seen by urologist as an outpatient, UA negative   Patient is scheduled for ESWL on 06/13/20, however due to severe left flank pain, patient cannot wait for scheduled procedure.     Patient is taking Florinef due to orthostatic hypotension.   ED Course: pt was found to have WBC 17.7, pending COVID-19 PCR, netagive urinalysis for UTI, AKI with creatinine 1.25, BUN 18, temperature normal, blood pressure 148/85, heart rate 76, RR 18, oxygen saturation 96% on room air.  Patient is admitted to [facility] bed as inpatient.  Dr. Bernardo Heater of urology is consulted.   Assessment & Plan:   Principal Problem:   Kidney stone Active Problems:   Mixed hyperlipidemia   Smoking   Cerebrovascular disease   COPD (chronic obstructive pulmonary disease) with chronic bronchitis (HCC)   CAD (coronary artery disease)   Depression   AKI (acute kidney injury) (HCC)   Leukocytosis   Nausea and vomiting   Orthostatic hypotension   Nephrolithiasis    Kidney  stone-left side:  -N.p.o. overnight Still complaining of pain and discomfort overnight, pain medication did help Urinalysis negative for UTI.  -Dr. Bernardo Heater of urology is consulted.... Anticipating cystoscopy urogram / lithotripsy planning for today 06/13/2020 -Pain management -Continue IV fluids   Mixed hyperlipidemia -We will continue statins -Stable  Smoking -Nicotine patch  Cerebrovascular disease -Remained stable, continue- aspirin and Lipitor, Zetia  COPD (chronic obstructive pulmonary disease) with chronic bronchitis ([place]):  -Stable no signs of exacerbation -Bronchodilators -Stable CAD (coronary artery disease): Denies any chest pain -Continue aspirin and Lipitor, Zetia -As needed nitroglycerin -Imdur  Depression: -Stable  AKI (acute kidney injury) ([place]):  Mild, likely due to kidney stone -improved -IV fluid: 1 L normal saline, followed by 75 cc/h -Creatinine 1.25, 1.35 >> 1.02  Leukocytosis:  WBC 17.6. >>>  12.3  Likely reactive -on steroids afebrile, normotensive  Urinalysis negative for UTI.   -We will follow up with blood/urine cultures  Nausea and vomiting: 1 episode of hematemesis on admission -Much improved hemoglobin 13.8  >> 12.0 >> 11.9 -Likely due to persistent vomiting -No further episodes, we will continue IV pantoprazole 40 mg bid -Continue as needed Zofran IV for nausea - Avoid NSAIDs and SQ heparin -Continue IV fluid  hydration -Monitoring H&H closely -Labs INR within normal limits 1.0  Orthostatic hypotension -Blood pressure has improved, stabilized -Continue Florinef -tapering down -Norvasc was discontinued (patient has been hypertensive)    Cultures; Blood Cultures x 2 >> NGT Urine Culture  >>> NGT   Antimicrobials:     Consultants: Urologist Dr. Bernardo Heater   ------------------------------------------------------------------------------------------------------------------------------------------------  DVT  prophylaxis:  SCD/Compression stockings Code Status:   Code Status: Full Code Family Communication: No family member present at bedside- attempt will be made to update daily The above findings and plan of care has been discussed with patient (and family )  in detail,  they expressed understanding and agreement of above. -Advance care planning has been discussed.   Admission status:    Status is: Inpatient  Remains inpatient appropriate because:Inpatient level of care appropriate due to severity of illness   Dispo: The patient is from: Home              Anticipated d/c is to: Home              Anticipated d/c date is: 2 days              Patient currently is not medically stable to d/c.        Procedures:   No admission procedures for hospital encounter.     Antimicrobials:  Anti-infectives (From admission, onward)   Start     Dose/Rate Route Frequency Ordered Stop   06/12/20 1900  ceFAZolin (ANCEF) IVPB 2g/100 mL premix        2 g 200 mL/hr over 30 Minutes Intravenous 30 min pre-op 06/12/20 1823         Medication:  . [MAR Hold] amLODipine  2.5 mg Oral Daily  . [MAR Hold] aspirin EC  81 mg Oral Daily  . [MAR Hold] atorvastatin  80 mg Oral q1800  . [MAR Hold] escitalopram  10 mg Oral Daily  . [MAR Hold] ezetimibe  10 mg Oral Daily  . [MAR Hold] fludrocortisone  0.05 mg Oral Once  . [MAR Hold] fludrocortisone  0.1 mg Oral Daily  . influenza vac split quadrivalent PF  0.5 mL Intramuscular Tomorrow-1000  . [MAR Hold] isosorbide mononitrate  30 mg Oral Daily  . [MAR Hold] mometasone-formoterol  2 puff Inhalation BID  . [MAR Hold] multivitamin with minerals  1 tablet Oral Daily  . [MAR Hold] nicotine  21 mg Transdermal Daily  . [MAR Hold] omega-3 acid ethyl esters  1 g Oral Daily  . [MAR Hold] pantoprazole (PROTONIX) IV  40 mg Intravenous Q12H    [MAR Hold] acetaminophen, [MAR Hold] albuterol, [MAR Hold] hydrALAZINE, [MAR Hold]  morphine injection, [MAR Hold]  nitroGLYCERIN, [MAR Hold] ondansetron (ZOFRAN) IV, [MAR Hold] oxyCODONE-acetaminophen, [MAR Hold] senna-docusate   Objective:   Vitals:   06/12/20 2349 06/13/20 0403 06/13/20 0851 06/13/20 1000  BP: 136/87 (!) 144/81 (!) 141/103 138/80  Pulse: 86 84 73 70  Resp: 17 20  20   Temp: 98.6 F (37 C) 98.6 F (37 C) 98.5 F (36.9 C) 98.8 F (37.1 C)  TempSrc:  Oral Oral Tympanic  SpO2: 93% 94% 90% 93%  Weight:      Height:        Intake/Output Summary (Last 24 hours) at 06/13/2020 1047 Last data filed at 06/13/2020 0800 Gross per 24 hour  Intake 2589.28 ml  Output --  Net 2589.28 ml   Filed Weights   06/11/20 1043 06/11/20 2013  Weight: 59 kg 58.2 kg  Examination:        Physical Exam:   General:  Alert, oriented, cooperative, no distress;   HEENT:  Normocephalic, PERRL, otherwise with in Normal limits   Neuro:  CNII-XII intact. , normal motor and sensation, reflexes intact   Lungs:   Clear to auscultation BL, Respirations unlabored, no wheezes / crackles  Cardio:    S1/S2, RRR, No murmure, No Rubs or Gallops   Abdomen:   Soft, non-tender, bowel sounds active all four quadrants,  no guarding or peritoneal signs.  Muscular skeletal:  Limited exam - in bed, able to move all 4 extremities, Normal strength,  2+ pulses,  symmetric, No pitting edema, left flank/CVA tenderness  Skin:  Dry, warm to touch, negative for any Rashes, No open wounds  Wounds: Please see nursing documentation         ------------------------------------------------------------------------------------------------------------------------------------------    LABs:  CBC Latest Ref Rng & Units 06/13/2020 06/13/2020 06/12/2020  WBC 4.0 - 10.5 K/uL 12.3(H) 13.8(H) 13.0(H)  Hemoglobin 13.0 - 17.0 g/dL 11.9(L) 11.3(L) 11.2(L)  Hematocrit 39 - 52 % 34.1(L) 33.1(L) 33.1(L)  Platelets 150 - 400 K/uL 173 177 185   CMP Latest Ref Rng & Units 06/13/2020 06/12/2020 06/11/2020  Glucose 70 - 99 mg/dL  93 96 136(H)  BUN 6 - 20 mg/dL 14 18 18   Creatinine 0.61 - 1.24 mg/dL 1.02 1.35(H) 1.25(H)  Sodium 135 - 145 mmol/L 140 140 144  Potassium 3.5 - 5.1 mmol/L 2.9(L) 3.1(L) 3.5  Chloride 98 - 111 mmol/L 105 107 106  CO2 22 - 32 mmol/L 26 25 27   Calcium 8.9 - 10.3 mg/dL 8.2(L) 8.1(L) 9.5  Total Protein 6.5 - 8.1 g/dL - - -  Total Bilirubin 0.3 - 1.2 mg/dL - - -  Alkaline Phos 38 - 126 U/L - - -  AST 15 - 41 U/L - - -  ALT 0 - 44 U/L - - -       Micro Results Recent Results (from the past 240 hour(s))  Microscopic Examination     Status: Abnormal   Collection Time: 06/05/20  3:47 PM   Urine  Result Value Ref Range Status   WBC, UA 0-5 0 - 5 /hpf Final   RBC 11-30 (A) 0 - 2 /hpf Final   Epithelial Cells (non renal) 0-10 0 - 10 /hpf Final   Casts Present (A) None seen /lpf Final   Cast Type Granular casts (A) N/A Final   Crystals Present (A) N/A Final   Crystal Type Amorphous Sediment N/A Final   Bacteria, UA None seen None seen/Few Final  CULTURE, URINE COMPREHENSIVE     Status: None   Collection Time: 06/05/20  4:34 PM   Specimen: Urine   Urine  Result Value Ref Range Status   Urine Culture, Comprehensive Final report  Final   Organism ID, Bacteria Comment  Final    Comment: Mixed urogenital flora 10,000-25,000 colony forming units per mL   Microscopic Examination     Status: Abnormal   Collection Time: 06/10/20  3:47 PM   Urine  Result Value Ref Range Status   WBC, UA 0-5 0 - 5 /hpf Final   RBC >30 (A) 0 - 2 /hpf Final   Epithelial Cells (non renal) 0-10 0 - 10 /hpf Final   Casts Present (A) None seen /lpf Final   Cast Type Granular casts (A) N/A Final   Bacteria, UA Few None seen/Few Final  SARS CORONAVIRUS 2 (TAT 6-24 HRS) Nasopharyngeal Nasopharyngeal Swab  Status: None   Collection Time: 06/11/20 11:17 AM   Specimen: Nasopharyngeal Swab  Result Value Ref Range Status   SARS Coronavirus 2 NEGATIVE NEGATIVE Final    Comment: (NOTE) SARS-CoV-2 target nucleic  acids are NOT DETECTED.  The SARS-CoV-2 RNA is generally detectable in upper and lower respiratory specimens during the acute phase of infection. Negative results do not preclude SARS-CoV-2 infection, do not rule out co-infections with other pathogens, and should not be used as the sole basis for treatment or other patient management decisions. Negative results must be combined with clinical observations, patient history, and epidemiological information. The expected result is Negative.  Fact Sheet for Patients: SugarRoll.be  Fact Sheet for Healthcare Providers: https://www.woods-mathews.com/  This test is not yet approved or cleared by the [place] FDA and  has been authorized for detection and/or diagnosis of SARS-CoV-2 by FDA under an Emergency Use Authorization (EUA). This EUA will remain  in effect (meaning this test can be used) for the duration of the COVID-19 declaration under Se ction 564(b)(1) of the Act, 21 U.S.C. section 360bbb-3(b)(1), unless the authorization is terminated or revoked sooner.  Performed at [facility], [place] [address] [place], [place] [address]   Respiratory Panel by RT PCR (Flu A&B, Covid) - Nasopharyngeal Swab     Status: None   Collection Time: 06/11/20  3:05 PM   Specimen: Nasopharyngeal Swab  Result Value Ref Range Status   SARS Coronavirus 2 by RT PCR NEGATIVE NEGATIVE Final    Comment: (NOTE) SARS-CoV-2 target nucleic acids are NOT DETECTED.  The SARS-CoV-2 RNA is generally detectable in upper respiratoy specimens during the acute phase of infection. The lowest concentration of SARS-CoV-2 viral copies this assay can detect is 131 copies/mL. A negative result does not preclude SARS-Cov-2 infection and should not be used as the sole basis for treatment or other patient management decisions. A negative result may occur with  improper specimen collection/handling, submission of specimen  other than nasopharyngeal swab, presence of viral mutation(s) within the areas targeted by this assay, and inadequate number of viral copies (<131 copies/mL). A negative result must be combined with clinical observations, patient history, and epidemiological information. The expected result is Negative.  Fact Sheet for Patients:  PinkCheek.be  Fact Sheet for Healthcare Providers:  GravelBags.it  This test is no t yet approved or cleared by the [place] FDA and  has been authorized for detection and/or diagnosis of SARS-CoV-2 by FDA under an Emergency Use Authorization (EUA). This EUA will remain  in effect (meaning this test can be used) for the duration of the COVID-19 declaration under Section 564(b)(1) of the Act, 21 U.S.C. section 360bbb-3(b)(1), unless the authorization is terminated or revoked sooner.     Influenza A by PCR NEGATIVE NEGATIVE Final   Influenza B by PCR NEGATIVE NEGATIVE Final    Comment: (NOTE) The Xpert Xpress SARS-CoV-2/FLU/RSV assay is intended as an aid in  the diagnosis of influenza from Nasopharyngeal swab specimens and  should not be used as a sole basis for treatment. Nasal washings and  aspirates are unacceptable for Xpert Xpress SARS-CoV-2/FLU/RSV  testing.  Fact Sheet for Patients: PinkCheek.be  Fact Sheet for Healthcare Providers: GravelBags.it  This test is not yet approved or cleared by the [place] FDA and  has been authorized for detection and/or diagnosis of SARS-CoV-2 by  FDA under an Emergency Use Authorization (EUA). This EUA will remain  in effect (meaning this test can be used) for the duration  of the  Covid-19 declaration under Section 564(b)(1) of the Act, 21  U.S.C. section 360bbb-3(b)(1), unless the authorization is  terminated or revoked. Performed at [facility], [address],  [place], [place] [address]   Urine Culture     Status: Abnormal   Collection Time: 06/11/20  3:53 PM   Specimen: Urine, Random  Result Value Ref Range Status   Specimen Description   Final    URINE, RANDOM Performed at [facility], [address], [place], [place] [address]    Special Requests   Final    NONE Performed at [facility], [address] [place], [place] [address]    Culture (A)  Final    <10,000 COLONIES/mL INSIGNIFICANT GROWTH Performed at [facility], [place] [address] [place], [place] [address]    Report Status 06/12/2020 FINAL  Final  Culture, blood (Routine X 2) w Reflex to ID Panel     Status: None (Preliminary result)   Collection Time: 06/11/20  9:23 PM   Specimen: BLOOD  Result Value Ref Range Status   Specimen Description BLOOD LEFT [facility]  Final   Special Requests   Final    BOTTLES DRAWN AEROBIC AND ANAEROBIC Blood Culture adequate volume   Culture   Final    NO GROWTH 2 DAYS Performed at [facility], [address], [place], [place] [address]    Report Status PENDING  Incomplete  Culture, blood (Routine X 2) w Reflex to ID Panel     Status: None (Preliminary result)   Collection Time: 06/11/20  9:29 PM   Specimen: BLOOD  Result Value Ref Range Status   Specimen Description BLOOD LEFT FA  Final   Special Requests   Final    BOTTLES DRAWN AEROBIC AND ANAEROBIC Blood Culture adequate volume   Culture   Final    NO GROWTH 2 DAYS Performed at [facility], [address], [place], [place] [address]    Report Status PENDING  Incomplete    Radiology Reports DG Abdomen 1 View  Result Date: 06/11/2020 CLINICAL DATA:  Flank pain EXAM: ABDOMEN - 1 VIEW COMPARISON:  CT abdomen and pelvis June 03, 2020 FINDINGS: There are three immediately adjacent calculi in the mid left kidney, each measuring 3 mm in size. There are apparent phleboliths in the pelvis. No other abnormal calcifications are evident. There is  moderate stool in the colon. There is no bowel dilatation or air-fluid level to suggest bowel obstruction. No free air. IMPRESSION: There are 3 immediately adjacent calculi in the mid left kidney which correspond to the apparent larger appearing calculus in the mid left kidney on recent CT. There are apparent phleboliths in pelvis. No other abnormal calcifications evident. Moderate stool in colon.  No bowel obstruction or free air evident. Electronically Signed   By: Lowella Grip III M.D.   On: 06/11/2020 14:59   CT ABDOMEN PELVIS W CONTRAST  Result Date: 06/03/2020 CLINICAL DATA:  Hematuria and left lower quadrant abdominal pain. Dark stools. EXAM: CT ABDOMEN AND PELVIS WITH CONTRAST TECHNIQUE: Multidetector CT imaging of the abdomen and pelvis was performed using the standard protocol following bolus administration of intravenous contrast. CONTRAST:  75mL OMNIPAQUE IOHEXOL 300 MG/ML  SOLN COMPARISON:  Abdominopelvic CT 11/26/2017 FINDINGS: Lower chest: Dependent opacities in both lower lobes typical of atelectasis. No pleural fluid. The heart is normal in size. Hepatobiliary: Borderline hepatic steatosis with mild diffusely decreased hepatic density. No focal hepatic lesion. Gallbladder physiologically  distended, no calcified stone. No biliary dilatation. Pancreas: No ductal dilatation or inflammation. Spleen: Normal in size without focal abnormality. Adrenals/Urinary Tract: Normal adrenal glands. Nonobstructing 7 mm stone in the upper left kidney. No hydronephrosis. There is symmetric renal excretion on delayed phase imaging. Homogeneous renal enhancement. Multiple low-density lesions in the left kidney, largest measuring 2.3 cm consistent with simple cyst. Many of the additional lesions are too small to accurately characterize. No evidence of focal solid lesion. Urinary bladder is minimally distended. No bladder wall thickening or stone. No ureteral stone. Stomach/Bowel: Bowel evaluation is limited in  the absence of enteric contrast and paucity of intra-abdominal fat. Stomach is partially distended, unremarkable. Occasional fluid-filled loops of small bowel without wall thickening, perienteric fat stranding, or obstruction. Cecum is slightly high-riding in the right mid abdomen. Appendix is not confidently visualized on the current exam. Moderate stool burden in the ascending and transverse colon. Descending and sigmoid colon are decompressed, limiting detailed assessment. There is no definite wall thickening. Vascular/Lymphatic: Moderate aorto bi-iliac atherosclerosis. No aortic aneurysm. The aortic branch vessels are patent. Portal vein is patent. No acute vascular findings. No bulky abdominopelvic adenopathy. Reproductive: Prostate is unremarkable. Other: No free air or free fluid.  No body wall hernia. Musculoskeletal: Degenerative change in the spine. Degenerative change in the right hip. Stable area of sclerosis involving the right iliac bone, stable from 2019, likely benign. IMPRESSION: 1. Nonobstructing left renal stone. No hydronephrosis or obstructive uropathy. 2. Decompressed descending and sigmoid colon, limiting detailed assessment. No definite wall thickening or inflammatory change. 3. Borderline hepatic steatosis. Aortic Atherosclerosis (ICD10-I70.0). Electronically Signed   By: Keith Rake M.D.   On: 06/03/2020 17:29   DG OR UROLOGY CYSTO IMAGE ([facility] ONLY)  Result Date: 06/13/2020 There is no interpretation for this exam.  This order is for images obtained during a surgical procedure.  Please See "Surgeries" Tab for more information regarding the procedure.    SIGNED: Deatra James, MD, FACP, FHM. [facility],  Pager (please use [email or web address] to page/text)  If 7PM-7AM, please contact night-coverage [email or web address] 06/13/2020, 10:47 AM

## 2020-06-13 NOTE — Interval H&P Note (Signed)
History and Physical Interval Note:  06/13/2020 10:10 AM  Isaiah Rangel  has presented today for surgery, with the diagnosis of left nephrolithiasis.  The various methods of treatment have been discussed with the patient and family. After consideration of risks, benefits and other options for treatment, the patient has consented to  Procedure(s): CYSTOSCOPY/URETEROSCOPY/HOLMIUM LASER/STENT PLACEMENT (Left) as a surgical intervention.  The patient's history has been reviewed, patient examined, no change in status, stable for surgery.  I have reviewed the patient's chart and labs.  Questions were answered to the patient's satisfaction.    RRR CTAB  Hollice Espy

## 2020-06-13 NOTE — Op Note (Signed)
Date of procedure: 06/13/20  Preoperative diagnosis:  1. Left flank pain 2. Left renal calculus with focal obstruction  Postoperative diagnosis:  1. Same as above  Procedure: 1. Left retrograde pyelogram 2. Left ureteroscopy (attempted) 3. Left ureteral stent placement, complicated 4. Interpretation of fluoroscopy less than 30 minutes  Surgeon: Hollice Espy, MD  Anesthesia: General  Complications: None  Intraoperative findings: Initial scout imaging revealed what appeared to be stone within the renal pelvis.  Retrograde however revealed a slightly dilated mid upper pole calyx which did not drain probably with retained contrast concerning for additional/persistent stone obstructing this calyceal infundibulum.  Unable to advance ureteroscope this targeted stent placement to the level of this infundibulum.  Small amount contrast extravasation at the end of the procedure.  EBL: minimal  Specimens: none  Drains: 6 x 26 French double-J ureteral stent placement on left  Indication: Isaiah Rangel is a 59 y.o. patient with severe pain secondary to 7 mm infundibular stone with focal left renal obstruction.  After reviewing the management options for treatment, he elected to proceed with the above surgical procedure(s). We have discussed the potential benefits and risks of the procedure, side effects of the proposed treatment, the likelihood of the patient achieving the goals of the procedure, and any potential problems that might occur during the procedure or recuperation. Informed consent has been obtained.  Description of procedure:  The patient was taken to the operating room and general anesthesia was induced.  The patient was placed in the dorsal lithotomy position, prepped and draped in the usual sterile fashion, and preoperative antibiotics were administered. A preoperative time-out was performed.   21 [place] scope was advanced per urethra into the bladder.  Attention was turned  to the left ureteral orifice which was cannulated just within the UO using a 5 [place] open-ended ureteral catheter.  I dense retrograde pyelogram was performed on the side which revealed a decompressed ureter and a fairly unremarkable collecting system.  On scout imaging, there was a what appeared to be stone at the level of the UPJ which was different than previously seen on CT scan.  Did not appreciate a stone within the upper/mid pole infundibulum.  That being said, as the contrast drained, there was retained contrast material and a mildly dilated calyx in the mid upper pole concerning for possible ongoing obstruction of this calyx.  I used a dual-lumen sheath to introduce a second Super Stiff wire but had some difficulty advancing this wire.  Ultimately, elected to advance a 4.5 semirigid just within the distal ureter but was not able to get much beyond the distal third of the ureter.  On direct visualization, I attempted to advance a Super Stiff wire but only was able to get it up to level the proximal ureter.  The ureter itself was very very tight.  I then attempted to use the inner lumen of the access sheath, 10 French in diameter to attempt to dilate the ureter but was very clear that this was not feasible and that ureteroscopy was not going to be possible.  The safety wire this point was coiled into the extreme upper pole which was just above where contrast remained pooling.  I was concerned that there was another stone still obstructing the infundibulum in the upper mid posterior pole as seen on CT.  I attempted to readjust the wire into this calyx but unsuccessfully.  I ended up using a Kumpe catheter over the wire up to the level of  the renal pelvis and use this to direct the wire into the specific calyx.  I was able to then advance the Kumpe into this calyx and injected contrast.  This confirmed that this was the correct calyx.  Upon doing so, there was a small amount of contrast extravasation, unclear  whether this was pyelosinus backflow or possible contrast extravasation, albeit minimal from manipulation of the upper tract.  Finally, I advanced a 6 x 26 French ureteral stent into this mid upper pole posterior calyx over the wire.  The wire was removed and a look over this calyx was achieved.  Upon removing the wire in entirety, full coil was noted within the bladder.  The bladder was then drained.  The patient was then cleaned and dried, repositioned in supine position, reversed myesthesia, and taken to the PACU in stable condition.  Plan: We will plan to return for staged procedure in 7 to 10 days.  Intraoperative findings were discussed with the patient's wife by telephone.  He also communicated recommendations to the hospitalist.  Appropriate for discharge when his pain is under control will return for staged procedure at that time.  Successful today in draining possibly obstructed calyx and renal unit but not able to address the stone.  Hollice Espy, M.D.

## 2020-06-13 NOTE — Discharge Summary (Addendum)
Physician Discharge Summary [facility]    Patient: Isaiah Rangel                   Admit date: 06/11/2020   DOB: November 16, 1960             Discharge date:06/13/2020/12:44 PM ZOX:[id number]                          PCP: Owens Loffler, MD  Disposition: HOME  Recommendations for Outpatient Follow-up:   . Follow up: in 1 week  Discharge Condition: Stable   Code Status:   Code Status: Full Code  Diet recommendation: Regular healthy diet   Discharge Diagnoses:    Principal Problem:   Kidney stone Active Problems:   Mixed hyperlipidemia   Smoking   Cerebrovascular disease   COPD (chronic obstructive pulmonary disease) with chronic bronchitis (HCC)   CAD (coronary artery disease)   Depression   AKI (acute kidney injury) ([place])   Leukocytosis   Nausea and vomiting   Orthostatic hypotension   Nephrolithiasis   History of Present Illness/ Hospital Course Kathleen Argue Summary:   [name] a 59 y.o.malewith medical history significant ofhypertension, hyperlipidemia, COPD, stroke, GERD, depression, tobacco abuse, CAD, coronary artery spasm, BPH, left kidney stone, who presents with left flank pain. Admitted for left renal nephrolithiasis -hematuria  Patient had CT scan of abdomen/pelvis, which showed nonobstructive 7 mm of left kidney stone.  Per patient was seen by urologist as an outpatient, UA negative  Patient is scheduled for ESWL on 06/13/20,however due to severe left flank pain, patient cannot waitforscheduled procedure.  Patient is taking Florinef due to orthostatic hypotension.   ED Course:pt was found to have WBC 17.7, pending COVID-19 PCR,netagiveurinalysis for UTI, AKI with creatinine 1.25, BUN 18, temperature normal, blood pressure 148/85, heart rate 76, RR 18, oxygen saturation 96% on room air. Patient is admitted to [facility] bed as inpatient. Dr. Bernardo Heater of urology is consulted.   Hospital course:   Kidney stone-left  side: -Was n.p.o. overnight >>> tolerated the procedure well, now tolerating p.o.  Patient went through cystoscopy/ureteroscopy/helium laser/stent placement Left ureteral stent was placed successfully --by Dr. Hollice Espy Patient was cleared by urology to be discharged home with close follow-up   Urinalysis negative for UTI.   Urology is consulted..S/p cystoscopy, urogram -06/13/2020 Status pot;Left retrograde pyelogram,   Left ureteroscopy , Left ureteral stent placement,   -Pain management-- cont PO meds  -Status post IV fluid resuscitation     Mixed hyperlipidemia -We will continue statins   Smoking -Nicotine patch  Cerebrovascular disease -Remained stable, continue- aspirin and Lipitor, Zetia  COPD (chronic obstructive pulmonary disease) with chronic bronchitis ([place]):  -Stable no signs of exacerbation -Bronchodilators -Stable CAD (coronary artery disease): Denies any chest pain -Continue aspirin and Lipitor, Zetia -As needed nitroglycerin -Imdur  Depression: -Stable  AKI (acute kidney injury) ([place]):  Mild, likely due to kidney stone -improved -IV fluid: 1 L normal saline, followed by 75 cc/h -Creatinine 1.25, 1.35 >> 1.02  Leukocytosis: WBC 17.6. >>>  12.3  Likely reactive -on steroids afebrile, normotensive Urinalysis negative for UTI.  -We will follow up with blood/urine cultures  Nausea and vomiting: 1 episode of hematemesis on admission -Much improved hemoglobin 13.8  >> 12.0 >> 11.9 -Likely due to persistent vomiting -No further episodes, we will continue IV pantoprazole40 mg bid -Continue as needed Zofran IV for nausea - Avoid NSAIDs and SQ heparin -Continue IV fluid  hydration -Monitoring H&H closely -Labs INR within normal limits 1.0  Orthostatic hypotension -Blood pressure has improved, stabilized -Continue Florinef -tapering down -Norvasc was discontinued (patient has been hypertensive)    Cultures; Blood  Cultures x 2 >> NGT Urine Culture  >>> NGT   Antimicrobials:     Consultants: Urologist Dr.Stoioff /Dr. Hollice Espy      Discharge Instructions:   Discharge Instructions    Activity as tolerated - No restrictions   Complete by: As directed    Call MD for:  difficulty breathing, headache or visual disturbances   Complete by: As directed    Call MD for:  persistant nausea and vomiting   Complete by: As directed    Call MD for:  severe uncontrolled pain   Complete by: As directed    Call MD for:  temperature >100.4   Complete by: As directed    Diet - low sodium heart healthy   Complete by: As directed    Discharge instructions   Complete by: As directed    Follow-up with urologist in next 2-3 days   Increase activity slowly   Complete by: As directed        Medication List    STOP taking these medications   potassium chloride 10 MEQ tablet Commonly known as: KLOR-CON     TAKE these medications   albuterol 108 (90 Base) MCG/ACT inhaler Commonly known as: VENTOLIN HFA Inhale 2 puffs into the lungs every 6 (six) hours as needed for wheezing or shortness of breath.   amLODipine 2.5 MG tablet Commonly known as: NORVASC TAKE 1 TABLET BY MOUTH  DAILY   aspirin EC 81 MG tablet Take 81 mg by mouth daily.   atorvastatin 80 MG tablet Commonly known as: LIPITOR Take 1 tablet (80 mg total) by mouth daily at 6 PM.   budesonide-formoterol 160-4.5 MCG/ACT inhaler Commonly known as: SYMBICORT Inhale 2 puffs into the lungs 2 (two) times daily.   escitalopram 10 MG tablet Commonly known as: LEXAPRO TAKE 1 TABLET BY MOUTH  DAILY   ezetimibe 10 MG tablet Commonly known as: ZETIA TAKE 1 TABLET BY MOUTH  DAILY   Fish Oil 1000 MG Caps Take 1,000 mg by mouth daily.   fludrocortisone 0.1 MG tablet Commonly known as: FLORINEF TAKE 2 TABLETS BY MOUTH  DAILY   isosorbide mononitrate 60 MG 24 hr tablet Commonly known as: IMDUR Take 0.5 tablets (30 mg total)  by mouth at bedtime.   isosorbide mononitrate 30 MG 24 hr tablet Commonly known as: IMDUR TAKE 1 TABLET BY MOUTH  DAILY   multivitamin with minerals Tabs tablet Take 1 tablet by mouth daily.   nicotine 21 mg/24hr patch Commonly known as: NICODERM CQ - dosed in mg/24 hours Place 1 patch (21 mg total) onto the skin daily.   nicotine polacrilex 2 MG gum Commonly known as: NICORETTE Take 1 each (2 mg total) by mouth every 4 (four) hours while awake.   nitroGLYCERIN 0.4 MG SL tablet Commonly known as: NITROSTAT DISSOLVE 1 TABLET UNDER THE TONGUE EVERY 5 MINUTES AS NEEDED FOR CHEST PAIN   ondansetron 4 MG tablet Commonly known as: Zofran Take 1 tablet (4 mg total) by mouth every 8 (eight) hours as needed.   oxyCODONE-acetaminophen 5-325 MG tablet Commonly known as: PERCOCET/ROXICET Take 1 tablet by mouth every 6 (six) hours as needed for severe pain.   tamsulosin 0.4 MG Caps capsule Commonly known as: FLOMAX Take 1 capsule (0.4 mg total) by mouth daily.  Allergies  Allergen Reactions  . Other Nausea And Vomiting and Other (See Comments)    Quail eggs caused severe stomach pain and n/v     Procedures /Studies:   DG Abdomen 1 View  Result Date: 06/11/2020 CLINICAL DATA:  Flank pain EXAM: ABDOMEN - 1 VIEW COMPARISON:  CT abdomen and pelvis June 03, 2020 FINDINGS: There are three immediately adjacent calculi in the mid left kidney, each measuring 3 mm in size. There are apparent phleboliths in the pelvis. No other abnormal calcifications are evident. There is moderate stool in the colon. There is no bowel dilatation or air-fluid level to suggest bowel obstruction. No free air. IMPRESSION: There are 3 immediately adjacent calculi in the mid left kidney which correspond to the apparent larger appearing calculus in the mid left kidney on recent CT. There are apparent phleboliths in pelvis. No other abnormal calcifications evident. Moderate stool in colon.  No bowel  obstruction or free air evident. Electronically Signed   By: Lowella Grip III M.D.   On: 06/11/2020 14:59   CT ABDOMEN PELVIS W CONTRAST  Result Date: 06/03/2020 CLINICAL DATA:  Hematuria and left lower quadrant abdominal pain. Dark stools. EXAM: CT ABDOMEN AND PELVIS WITH CONTRAST TECHNIQUE: Multidetector CT imaging of the abdomen and pelvis was performed using the standard protocol following bolus administration of intravenous contrast. CONTRAST:  34mL OMNIPAQUE IOHEXOL 300 MG/ML  SOLN COMPARISON:  Abdominopelvic CT 11/26/2017 FINDINGS: Lower chest: Dependent opacities in both lower lobes typical of atelectasis. No pleural fluid. The heart is normal in size. Hepatobiliary: Borderline hepatic steatosis with mild diffusely decreased hepatic density. No focal hepatic lesion. Gallbladder physiologically distended, no calcified stone. No biliary dilatation. Pancreas: No ductal dilatation or inflammation. Spleen: Normal in size without focal abnormality. Adrenals/Urinary Tract: Normal adrenal glands. Nonobstructing 7 mm stone in the upper left kidney. No hydronephrosis. There is symmetric renal excretion on delayed phase imaging. Homogeneous renal enhancement. Multiple low-density lesions in the left kidney, largest measuring 2.3 cm consistent with simple cyst. Many of the additional lesions are too small to accurately characterize. No evidence of focal solid lesion. Urinary bladder is minimally distended. No bladder wall thickening or stone. No ureteral stone. Stomach/Bowel: Bowel evaluation is limited in the absence of enteric contrast and paucity of intra-abdominal fat. Stomach is partially distended, unremarkable. Occasional fluid-filled loops of small bowel without wall thickening, perienteric fat stranding, or obstruction. Cecum is slightly high-riding in the right mid abdomen. Appendix is not confidently visualized on the current exam. Moderate stool burden in the ascending and transverse colon.  Descending and sigmoid colon are decompressed, limiting detailed assessment. There is no definite wall thickening. Vascular/Lymphatic: Moderate aorto bi-iliac atherosclerosis. No aortic aneurysm. The aortic branch vessels are patent. Portal vein is patent. No acute vascular findings. No bulky abdominopelvic adenopathy. Reproductive: Prostate is unremarkable. Other: No free air or free fluid.  No body wall hernia. Musculoskeletal: Degenerative change in the spine. Degenerative change in the right hip. Stable area of sclerosis involving the right iliac bone, stable from 2019, likely benign. IMPRESSION: 1. Nonobstructing left renal stone. No hydronephrosis or obstructive uropathy. 2. Decompressed descending and sigmoid colon, limiting detailed assessment. No definite wall thickening or inflammatory change. 3. Borderline hepatic steatosis. Aortic Atherosclerosis (ICD10-I70.0). Electronically Signed   By: Keith Rake M.D.   On: 06/03/2020 17:29   DG OR UROLOGY CYSTO IMAGE ([facility] ONLY)  Result Date: 06/13/2020 There is no interpretation for this exam.  This order is for images obtained during a surgical  procedure.  Please See "Surgeries" Tab for more information regarding the procedure.     Subjective:   Patient was seen and examined 06/13/2020, 12:44 PM Patient stable today. No acute distress.  No issues overnight Stable for discharge.  Discharge Exam:    Vitals:   06/13/20 1143 06/13/20 1158 06/13/20 1213 06/13/20 1228  BP: 114/84 125/83 (!) 161/90 129/84  Pulse:  81 85 73  Resp:  15 12 20   Temp: 97.9 F (36.6 C)   98 F (36.7 C)  TempSrc:      SpO2:  94% 93% 95%  Weight:      Height:        General: Pt lying comfortably in bed & appears in no obvious distress. Cardiovascular: S1 & S2 heard, RRR, S1/S2 +. No murmurs, rubs, gallops or clicks. No JVD or pedal edema. Respiratory: Clear to auscultation without wheezing, rhonchi or crackles. No increased work of breathing. Abdominal:   Non-distended, non-tender & soft. No organomegaly or masses appreciated. Normal bowel sounds heard. CNS: Alert and oriented. No focal deficits. Extremities: no edema, no cyanosis    The results of significant diagnostics from this hospitalization (including imaging, microbiology, ancillary and laboratory) are listed below for reference.      Microbiology:   Recent Results (from the past 240 hour(s))  Microscopic Examination     Status: Abnormal   Collection Time: 06/05/20  3:47 PM   Urine  Result Value Ref Range Status   WBC, UA 0-5 0 - 5 /hpf Final   RBC 11-30 (A) 0 - 2 /hpf Final   Epithelial Cells (non renal) 0-10 0 - 10 /hpf Final   Casts Present (A) None seen /lpf Final   Cast Type Granular casts (A) N/A Final   Crystals Present (A) N/A Final   Crystal Type Amorphous Sediment N/A Final   Bacteria, UA None seen None seen/Few Final  CULTURE, URINE COMPREHENSIVE     Status: None   Collection Time: 06/05/20  4:34 PM   Specimen: Urine   Urine  Result Value Ref Range Status   Urine Culture, Comprehensive Final report  Final   Organism ID, Bacteria Comment  Final    Comment: Mixed urogenital flora 10,000-25,000 colony forming units per mL   Microscopic Examination     Status: Abnormal   Collection Time: 06/10/20  3:47 PM   Urine  Result Value Ref Range Status   WBC, UA 0-5 0 - 5 /hpf Final   RBC >30 (A) 0 - 2 /hpf Final   Epithelial Cells (non renal) 0-10 0 - 10 /hpf Final   Casts Present (A) None seen /lpf Final   Cast Type Granular casts (A) N/A Final   Bacteria, UA Few None seen/Few Final  SARS CORONAVIRUS 2 (TAT 6-24 HRS) Nasopharyngeal Nasopharyngeal Swab     Status: None   Collection Time: 06/11/20 11:17 AM   Specimen: Nasopharyngeal Swab  Result Value Ref Range Status   SARS Coronavirus 2 NEGATIVE NEGATIVE Final    Comment: (NOTE) SARS-CoV-2 target nucleic acids are NOT DETECTED.  The SARS-CoV-2 RNA is generally detectable in upper and lower respiratory  specimens during the acute phase of infection. Negative results do not preclude SARS-CoV-2 infection, do not rule out co-infections with other pathogens, and should not be used as the sole basis for treatment or other patient management decisions. Negative results must be combined with clinical observations, patient history, and epidemiological information. The expected result is Negative.  Fact Sheet for Patients: SugarRoll.be  Fact Sheet for Healthcare Providers: https://www.woods-mathews.com/  This test is not yet approved or cleared by the [place] FDA and  has been authorized for detection and/or diagnosis of SARS-CoV-2 by FDA under an Emergency Use Authorization (EUA). This EUA will remain  in effect (meaning this test can be used) for the duration of the COVID-19 declaration under Se ction 564(b)(1) of the Act, 21 U.S.C. section 360bbb-3(b)(1), unless the authorization is terminated or revoked sooner.  Performed at [facility], [place] [address] [place], [place] [address]   Respiratory Panel by RT PCR (Flu A&B, Covid) - Nasopharyngeal Swab     Status: None   Collection Time: 06/11/20  3:05 PM   Specimen: Nasopharyngeal Swab  Result Value Ref Range Status   SARS Coronavirus 2 by RT PCR NEGATIVE NEGATIVE Final    Comment: (NOTE) SARS-CoV-2 target nucleic acids are NOT DETECTED.  The SARS-CoV-2 RNA is generally detectable in upper respiratoy specimens during the acute phase of infection. The lowest concentration of SARS-CoV-2 viral copies this assay can detect is 131 copies/mL. A negative result does not preclude SARS-Cov-2 infection and should not be used as the sole basis for treatment or other patient management decisions. A negative result may occur with  improper specimen collection/handling, submission of specimen other than nasopharyngeal swab, presence of viral mutation(s) within the areas targeted by this assay,  and inadequate number of viral copies (<131 copies/mL). A negative result must be combined with clinical observations, patient history, and epidemiological information. The expected result is Negative.  Fact Sheet for Patients:  PinkCheek.be  Fact Sheet for Healthcare Providers:  GravelBags.it  This test is no t yet approved or cleared by the [place] FDA and  has been authorized for detection and/or diagnosis of SARS-CoV-2 by FDA under an Emergency Use Authorization (EUA). This EUA will remain  in effect (meaning this test can be used) for the duration of the COVID-19 declaration under Section 564(b)(1) of the Act, 21 U.S.C. section 360bbb-3(b)(1), unless the authorization is terminated or revoked sooner.     Influenza A by PCR NEGATIVE NEGATIVE Final   Influenza B by PCR NEGATIVE NEGATIVE Final    Comment: (NOTE) The Xpert Xpress SARS-CoV-2/FLU/RSV assay is intended as an aid in  the diagnosis of influenza from Nasopharyngeal swab specimens and  should not be used as a sole basis for treatment. Nasal washings and  aspirates are unacceptable for Xpert Xpress SARS-CoV-2/FLU/RSV  testing.  Fact Sheet for Patients: PinkCheek.be  Fact Sheet for Healthcare Providers: GravelBags.it  This test is not yet approved or cleared by the [place] FDA and  has been authorized for detection and/or diagnosis of SARS-CoV-2 by  FDA under an Emergency Use Authorization (EUA). This EUA will remain  in effect (meaning this test can be used) for the duration of the  Covid-19 declaration under Section 564(b)(1) of the Act, 21  U.S.C. section 360bbb-3(b)(1), unless the authorization is  terminated or revoked. Performed at [facility], [address], [place], [place] [address]   Urine Culture     Status: Abnormal   Collection Time: 06/11/20  3:53 PM   Specimen:  Urine, Random  Result Value Ref Range Status   Specimen Description   Final    URINE, RANDOM Performed at [facility], [address] [place], [place] [address]    Special Requests   Final    NONE Performed at [facility], [address] [place], [place] [address]  Culture (A)  Final    <10,000 COLONIES/mL INSIGNIFICANT GROWTH Performed at [facility] [address] [place], [place] [address]    Report Status 06/12/2020 FINAL  Final  Culture, blood (Routine X 2) w Reflex to ID Panel     Status: None (Preliminary result)   Collection Time: 06/11/20  9:23 PM   Specimen: BLOOD  Result Value Ref Range Status   Specimen Description BLOOD LEFT AC  Final   Special Requests   Final    BOTTLES DRAWN AEROBIC AND ANAEROBIC Blood Culture adequate volume   Culture   Final    NO GROWTH 2 DAYS Performed at [facility], [address] [place], [place] [address]    Report Status PENDING  Incomplete  Culture, blood (Routine X 2) w Reflex to ID Panel     Status: None (Preliminary result)   Collection Time: 06/11/20  9:29 PM   Specimen: BLOOD  Result Value Ref Range Status   Specimen Description BLOOD LEFT FA  Final   Special Requests   Final    BOTTLES DRAWN AEROBIC AND ANAEROBIC Blood Culture adequate volume   Culture   Final    NO GROWTH 2 DAYS Performed at [facility], [address] [place], [place] [address]    Report Status PENDING  Incomplete     Labs:   CBC: Recent Labs  Lab 06/11/20 1059 06/11/20 2116 06/12/20 0549 06/12/20 1227 06/12/20 1840 06/13/20 0034 06/13/20 0659  WBC 17.7*   < > 14.5* 14.8* 13.0* 13.8* 12.3*  NEUTROABS 13.6*  --   --   --   --   --   --   HGB 13.8   < > 11.8* 12.0* 11.2* 11.3* 11.9*  HCT 40.4   < > 34.0* 35.0* 33.1* 33.1* 34.1*  MCV 93.1   < > 92.4 93.3 93.5 93.0 91.9  PLT 237   < > 174 180 185 177 173   < > = values in this interval not displayed.   Basic Metabolic Panel: Recent  Labs  Lab 06/11/20 1059 06/12/20 0549 06/13/20 0659  NA 144 140 140  K 3.5 3.1* 2.9*  CL 106 107 105  CO2 27 25 26   GLUCOSE 136* 96 93  BUN 18 18 14   CREATININE 1.25* 1.35* 1.02  CALCIUM 9.5 8.1* 8.2*   Liver Function Tests: No results for input(s): AST, ALT, ALKPHOS, BILITOT, PROT, ALBUMIN in the last 168 hours. BNP (last 3 results) Recent Labs    06/11/20 1059  BNP 56.2   Cardiac Enzymes: No results for input(s): CKTOTAL, CKMB, CKMBINDEX, TROPONINI in the last 168 hours. CBG: Recent Labs  Lab 06/13/20 0823  GLUCAP 88   Hgb A1c No results for input(s): HGBA1C in the last 72 hours. Lipid Profile No results for input(s): CHOL, HDL, LDLCALC, TRIG, CHOLHDL, LDLDIRECT in the last 72 hours. Thyroid function studies No results for input(s): TSH, T4TOTAL, T3FREE, THYROIDAB in the last 72 hours.  Invalid input(s): FREET3 Anemia work up No results for input(s): VITAMINB12, FOLATE, FERRITIN, TIBC, IRON, RETICCTPCT in the last 72 hours. Urinalysis    Component Value Date/Time   COLORURINE YELLOW (A) 06/11/2020 1552   APPEARANCEUR CLOUDY (A) 06/11/2020 1552   APPEARANCEUR Cloudy (A) 06/10/2020 1547   LABSPEC 1.010 06/11/2020 1552   PHURINE 8.0 06/11/2020 1552   GLUCOSEU NEGATIVE 06/11/2020 1552   HGBUR SMALL (A) 06/11/2020 1552   HGBUR moderate 07/09/2010 [place] 06/11/2020 1552  BILIRUBINUR Negative 06/10/2020 1547   KETONESUR 5 (A) 06/11/2020 1552   PROTEINUR NEGATIVE 06/11/2020 1552   UROBILINOGEN 0.2 07/09/2010 1144   NITRITE NEGATIVE 06/11/2020 1552   LEUKOCYTESUR NEGATIVE 06/11/2020 1552         Time coordinating discharge: Over 45 minutes  SIGNED: Deatra James, MD, FACP, FHM. [facility],  Please use [email or web address] to Page If 7PM-7AM, please contact night-coverage [email or web address] 06/13/2020, 12:44 PM

## 2020-06-14 ENCOUNTER — Other Ambulatory Visit: Payer: Self-pay | Admitting: Radiology

## 2020-06-14 ENCOUNTER — Encounter: Payer: Self-pay | Admitting: Urology

## 2020-06-14 DIAGNOSIS — N200 Calculus of kidney: Secondary | ICD-10-CM

## 2020-06-15 ENCOUNTER — Emergency Department: Payer: 59

## 2020-06-15 ENCOUNTER — Other Ambulatory Visit: Payer: Self-pay

## 2020-06-15 ENCOUNTER — Emergency Department
Admission: EM | Admit: 2020-06-15 | Discharge: 2020-06-15 | Disposition: A | Discharge status: Home / Self Care | Payer: 59 | Attending: Emergency Medicine | Admitting: Emergency Medicine

## 2020-06-15 DIAGNOSIS — F17210 Nicotine dependence, cigarettes, uncomplicated: Secondary | ICD-10-CM | POA: Insufficient documentation

## 2020-06-15 DIAGNOSIS — Z7982 Long term (current) use of aspirin: Secondary | ICD-10-CM | POA: Insufficient documentation

## 2020-06-15 DIAGNOSIS — Z7951 Long term (current) use of inhaled steroids: Secondary | ICD-10-CM | POA: Insufficient documentation

## 2020-06-15 DIAGNOSIS — K5903 Drug induced constipation: Secondary | ICD-10-CM | POA: Insufficient documentation

## 2020-06-15 DIAGNOSIS — I2510 Atherosclerotic heart disease of native coronary artery without angina pectoris: Secondary | ICD-10-CM | POA: Insufficient documentation

## 2020-06-15 DIAGNOSIS — R1030 Lower abdominal pain, unspecified: Secondary | ICD-10-CM | POA: Diagnosis present

## 2020-06-15 DIAGNOSIS — J449 Chronic obstructive pulmonary disease, unspecified: Secondary | ICD-10-CM | POA: Insufficient documentation

## 2020-06-15 LAB — COMPREHENSIVE METABOLIC PANEL
ALT: 19 U/L (ref 0–44)
AST: 27 U/L (ref 15–41)
Albumin: 4.5 g/dL (ref 3.5–5.0)
Alkaline Phosphatase: 71 U/L (ref 38–126)
Anion gap: 12 (ref 5–15)
BUN: 14 mg/dL (ref 6–20)
CO2: 29 mmol/L (ref 22–32)
Calcium: 9.1 mg/dL (ref 8.9–10.3)
Chloride: 103 mmol/L (ref 98–111)
Creatinine, Ser: 0.87 mg/dL (ref 0.61–1.24)
GFR, Estimated: >60 mL/min (ref >60)
Glucose, Bld: 134 mg/dL — ABNORMAL HIGH (ref 70–99)
Potassium: 3.1 mmol/L — ABNORMAL LOW (ref 3.5–5.1)
Sodium: 144 mmol/L (ref 135–145)
Total Bilirubin: 0.6 mg/dL (ref 0.3–1.2)
Total Protein: 7.5 g/dL (ref 6.5–8.1)

## 2020-06-15 LAB — URINALYSIS, COMPLETE (UACMP) WITH MICROSCOPIC
Bacteria, UA: NONE SEEN
RBC / HPF: >50 RBC/hpf — ABNORMAL HIGH (ref 0–5)
Specific Gravity, Urine: 1.026 (ref 1.005–1.030)
Squamous Epithelial / HPF: NONE SEEN (ref 0–5)

## 2020-06-15 LAB — LIPASE, BLOOD: Lipase: 81 U/L — ABNORMAL HIGH (ref 11–51)

## 2020-06-15 LAB — CBC
HCT: 38.7 % — ABNORMAL LOW (ref 39.0–52.0)
Hemoglobin: 13.5 g/dL (ref 13.0–17.0)
MCH: 31.8 pg (ref 26.0–34.0)
MCHC: 34.9 g/dL (ref 30.0–36.0)
MCV: 91.1 fL (ref 80.0–100.0)
Platelets: 256 10*3/uL (ref 150–400)
RBC: 4.25 MIL/uL (ref 4.22–5.81)
RDW: 16.9 % — ABNORMAL HIGH (ref 11.5–15.5)
WBC: 13.8 10*3/uL — ABNORMAL HIGH (ref 4.0–10.5)
nRBC: 0 % (ref 0.0–0.2)

## 2020-06-15 MED ORDER — IOHEXOL 300 MG/ML  SOLN
75.0000 mL | Freq: Once | INTRAMUSCULAR | Status: AC | PRN
  Administered 2020-06-15: 75 mL via INTRAVENOUS

## 2020-06-15 NOTE — ED Notes (Signed)
Patient reports he had a large bowel movement and now feels much better.

## 2020-06-15 NOTE — ED Provider Notes (Signed)
[facility] Emergency Department Provider Note   ____________________________________________   First MD Initiated Contact with Patient 06/15/20 2036     (approximate)  I have reviewed the triage vital signs and the nursing notes.   HISTORY  Chief Complaint Abdominal Pain    HPI Isaiah Rangel is a 59 y.o. male with a history of right renal calculi with stent in place that was just placed 1 day prior to arrival who presents for constipation and lower abdominal pain. Patient states that he has not had a bowel movement in the last 5 days and has attempted a saline enema, soapsuds enema, Dulcolax suppository, Dulcolax tablet, bottle of mag citrate and has had no bowel movement despite these. Patient's wife is a Marine scientist and states that she felt hard stool in his rectum. Patient is also endorsing associated nausea and vomiting that began approximately 2 hours prior to arrival. Patient describes sharp, 10/10, intermittent bilateral lower quadrant abdominal pain that has no exacerbating or relieving factors. Patient states that he had a massive bowel movement upon arrival to the emergency department and his abdominal pain/vomiting have resolved         Past Medical History:  Diagnosis Date  . Allergy   . Anxiety   . BPH (benign prostatic hyperplasia)   . COPD (chronic obstructive pulmonary disease) ([place])   . Coronary vasospasm ([place])    a. 03/2019 Cath: RCA 49m but only 40% after intracoronary ntg-->Imdur started.  [place] Kitchen History of echocardiogram    a. 03/2019 Echo: EF 50-55%, nl RV size/fxn. Trace MR/TR.  . Non-obstructive CAD (coronary artery disease)    a. 03/2013 Cath: min irregs. EF >55%; b. 03/2019 Cath: LM nl, LAD nl, LCX nl, OM1 40, RCA 30p, 28m-->40% after IC NTG.  . OA (osteoarthritis)   . Tobacco abuse     Patient Active Problem List   Diagnosis Date Noted  . Nephrolithiasis 06/13/2020  . Kidney stone 06/11/2020  . Nausea and vomiting 06/11/2020  .  Orthostatic hypotension 06/11/2020  . CAD (coronary artery disease)   . Depression   . AKI (acute kidney injury) ([place])   . Leukocytosis   . NSTEMI (non-ST elevated myocardial infarction) ([place]) 03/20/2019  . COPD (chronic obstructive pulmonary disease) with chronic bronchitis ([place]) 09/05/2018  . Acute ischemic enteritis ([place]) 11/26/2017  . Coronary artery disease involving native coronary artery of native heart without angina pectoris 02/21/2016  . Cerebrovascular disease 04/29/2015  . Radicular pain of right lower back 12/05/2013  . S/P cardiac catheterization 04/19/2013  . Smoking 09/09/2011  . Mixed hyperlipidemia 04/09/2010  . GERD 09/21/2008  . ERECTILE DYSFUNCTION 09/20/2008    Past Surgical History:  Procedure Laterality Date  . BACK SURGERY    . CARDIAC CATHETERIZATION  04/06/13   [facility]- minor luminal irregularities, otherwise normal cors; EF > 55%. Medical management and smoking cessation recommended  . CYSTOSCOPY/URETEROSCOPY/HOLMIUM LASER/STENT PLACEMENT Left 06/13/2020   Procedure: CYSTOSCOPY/URETEROSCOPY//STENT PLACEMENT;  Surgeon: Hollice Espy, MD;  Location: [facility] ORS;  Service: Urology;  Laterality: Left;  . KNEE ARTHROSCOPY W/ PARTIAL MEDIAL MENISCECTOMY Right 2012   [place], 2012  . LEFT HEART CATH AND CORONARY ANGIOGRAPHY N/A 03/20/2019   Procedure: LEFT HEART CATH AND CORONARY ANGIOGRAPHY;  Surgeon: Troy Sine, MD;  Location: [place] [facility];  Service: Cardiovascular;  Laterality: N/A;  . NASAL SINUS SURGERY    . neck fusion    . VASECTOMY      Prior to Admission medications   Medication Sig  Start Date End Date Taking? Authorizing Provider  albuterol (VENTOLIN HFA) 108 (90 Base) MCG/ACT inhaler Inhale 2 puffs into the lungs every 6 (six) hours as needed for wheezing or shortness of breath.    [provider]  amLODipine (NORVASC) 2.5 MG tablet TAKE 1 TABLET BY MOUTH  DAILY 03/28/20   Minna Merritts, MD  aspirin EC 81 MG tablet Take 81 mg by  mouth daily.    [provider]  atorvastatin (LIPITOR) 80 MG tablet Take 1 tablet (80 mg total) by mouth daily at 6 PM. 02/07/20   Loel Dubonnet, NP  budesonide-formoterol (SYMBICORT) 160-4.5 MCG/ACT inhaler Inhale 2 puffs into the lungs 2 (two) times daily. 09/05/18   [name], [name], MD  escitalopram (LEXAPRO) 10 MG tablet TAKE 1 TABLET BY MOUTH  DAILY 03/28/20   [name], [name], MD  ezetimibe (ZETIA) 10 MG tablet TAKE 1 TABLET BY MOUTH  DAILY 06/03/20   Loel Dubonnet, NP  fludrocortisone (FLORINEF) 0.1 MG tablet TAKE 2 TABLETS BY MOUTH  DAILY 05/16/20   Loel Dubonnet, NP  isosorbide mononitrate (IMDUR) 30 MG 24 hr tablet TAKE 1 TABLET BY MOUTH  DAILY 03/05/20   Loel Dubonnet, NP  isosorbide mononitrate (IMDUR) 60 MG 24 hr tablet Take 0.5 tablets (30 mg total) by mouth at bedtime. Patient not taking: Reported on 06/11/2020 02/07/20   Loel Dubonnet, NP  Multiple Vitamin (MULTIVITAMIN WITH MINERALS) TABS tablet Take 1 tablet by mouth daily.    [provider]  nicotine (NICODERM CQ - DOSED IN MG/24 HOURS) 21 mg/24hr patch Place 1 patch (21 mg total) onto the skin daily. Patient not taking: Reported on 06/11/2020 01/04/20   Loel Dubonnet, NP  nicotine polacrilex (NICORETTE) 2 MG gum Take 1 each (2 mg total) by mouth every 4 (four) hours while awake. Patient not taking: Reported on 06/11/2020 01/04/20   Loel Dubonnet, NP  nitroGLYCERIN (NITROSTAT) 0.4 MG SL tablet DISSOLVE 1 TABLET UNDER THE TONGUE EVERY 5 MINUTES AS NEEDED FOR CHEST PAIN 06/06/19   Minna Merritts, MD  Omega-3 Fatty Acids (FISH OIL) 1000 MG CAPS Take 1,000 mg by mouth daily.     [provider]  ondansetron (ZOFRAN) 4 MG tablet Take 1 tablet (4 mg total) by mouth every 8 (eight) hours as needed. 06/10/20   Vaillancourt, Aldona Bar, PA-C  oxyCODONE-acetaminophen (PERCOCET/ROXICET) 5-325 MG tablet Take 1 tablet by mouth every 6 (six) hours as needed for severe pain. 06/03/20   Cuthriell,  Charline Bills, PA-C  tamsulosin (FLOMAX) 0.4 MG CAPS capsule Take 1 capsule (0.4 mg total) by mouth daily. 06/13/20 07/13/20  Deatra James, MD    Allergies Other  Family History  Problem Relation Age of Onset  . Arthritis Father   . Coronary artery disease Father   . Hypertension Father   . CVA Father 60  . Colon cancer Mother   . Heart attack Brother   . Heart attack Brother   . Coronary artery disease Brother   . Esophageal cancer Neg Hx   . Stomach cancer Neg Hx   . Rectal cancer Neg Hx     Social History Social History   Tobacco Use  . Smoking status: Current Every Day Smoker    Packs/day: 1.00    Years: 30.00    Pack years: 30.00    Types: Cigarettes  . Smokeless tobacco: Never Used  . Tobacco comment: he is aware he needs to quit   Vaping Use  .  Vaping Use: Never used  Substance Use Topics  . Alcohol use: No    Alcohol/week: 0.0 standard drinks  . Drug use: No    Review of Systems Constitutional: No fever/chills Eyes: No visual changes. ENT: No sore throat. Cardiovascular: Denies chest pain. Respiratory: Denies shortness of breath. Gastrointestinal: Endorses abdominal pain.  No nausea, no vomiting.  No diarrhea. Genitourinary: Negative for dysuria. Musculoskeletal: Negative for acute arthralgias Skin: Negative for rash. Neurological: Negative for headaches, weakness/numbness/paresthesias in any extremity Psychiatric: Negative for suicidal ideation/homicidal ideation   ____________________________________________   PHYSICAL EXAM:  VITAL SIGNS: ED Triage Vitals  Enc Vitals Group     BP 06/15/20 1853 (!) 133/96     Pulse Rate 06/15/20 1853 95     Resp 06/15/20 1853 20     Temp 06/15/20 1853 98.1 F (36.7 C)     Temp Source 06/15/20 1853 Oral     SpO2 06/15/20 1853 96 %     Weight 06/15/20 1851 125 lb (56.7 kg)     Height 06/15/20 1851 5\' 5"  (1.651 m)     Head Circumference --      Peak Flow --      Pain Score 06/15/20 1850 4     Pain  Loc --      Pain Edu? --      Excl. in [place]? --    Constitutional: Alert and oriented. Well appearing and in no acute distress. Eyes: Conjunctivae are normal. PERRL. Head: Atraumatic. Nose: No congestion/rhinnorhea. Mouth/Throat: Mucous membranes are moist. Neck: No stridor Cardiovascular: Grossly normal heart sounds.  Good peripheral circulation. Respiratory: Normal respiratory effort.  No retractions. Gastrointestinal: Soft and nontender. No distention. Musculoskeletal: No obvious deformities Neurologic:  Normal speech and language. No gross focal neurologic deficits are appreciated. Skin:  Skin is warm and dry. No rash noted. Psychiatric: Mood and affect are normal. Speech and behavior are normal.  ____________________________________________   LABS (all labs ordered are listed, but only abnormal results are displayed)  Labs Reviewed  LIPASE, BLOOD - Abnormal; Notable for the following components:      Result Value   Lipase 81 (*)    All other components within normal limits  COMPREHENSIVE METABOLIC PANEL - Abnormal; Notable for the following components:   Potassium 3.1 (*)    Glucose, Bld 134 (*)    All other components within normal limits  CBC - Abnormal; Notable for the following components:   WBC 13.8 (*)    HCT 38.7 (*)    RDW 16.9 (*)    All other components within normal limits  URINALYSIS, COMPLETE (UACMP) WITH MICROSCOPIC - Abnormal; Notable for the following components:   Color, Urine RED (*)    APPearance CLOUDY (*)    Glucose, UA   (*)    Value: TEST NOT REPORTED DUE TO COLOR INTERFERENCE OF URINE PIGMENT   Hgb urine dipstick   (*)    Value: TEST NOT REPORTED DUE TO COLOR INTERFERENCE OF URINE PIGMENT   Bilirubin Urine   (*)    Value: TEST NOT REPORTED DUE TO COLOR INTERFERENCE OF URINE PIGMENT   Ketones, ur   (*)    Value: TEST NOT REPORTED DUE TO COLOR INTERFERENCE OF URINE PIGMENT   Protein, ur   (*)    Value: TEST NOT REPORTED DUE TO COLOR  INTERFERENCE OF URINE PIGMENT   Nitrite   (*)    Value: TEST NOT REPORTED DUE TO COLOR INTERFERENCE OF URINE PIGMENT   Leukocytes,Ua   (*)  Value: TEST NOT REPORTED DUE TO COLOR INTERFERENCE OF URINE PIGMENT   RBC / HPF >50 (*)    All other components within normal limits   _ RADIOLOGY  ED MD interpretation: CT of the abdomen and pelvis with contrast show evidence of colitis and diarrhea in the colon without any evidence of perforation, new kidney stone, or free air  Official radiology report(s): CT ABDOMEN PELVIS W CONTRAST  Result Date: 06/15/2020 CLINICAL DATA:  59 year old male with nausea vomiting and abdominal pain. EXAM: CT ABDOMEN AND PELVIS WITH CONTRAST TECHNIQUE: Multidetector CT imaging of the abdomen and pelvis was performed using the standard protocol following bolus administration of intravenous contrast. CONTRAST:  31mL OMNIPAQUE IOHEXOL 300 MG/ML  SOLN COMPARISON:  CT abdomen pelvis dated 06/03/2020. FINDINGS: Lower chest: The visualized lung bases are clear. No intra-abdominal free air.  Small free fluid in the pelvis. Hepatobiliary: No focal liver abnormality is seen. No gallstones, gallbladder wall thickening, or biliary dilatation. Pancreas: Unremarkable. No pancreatic ductal dilatation or surrounding inflammatory changes. Spleen: Normal in size without focal abnormality. Adrenals/Urinary Tract: There has been interval placement of a left ureteral stent with proximal tip in the central collecting system and distal end within the urinary bladder. The previously seen left renal stone has migrated into the left ureteropelvic junction. There is mild left hydronephrosis similar to prior CT. Several small left renal cysts. The right kidney is unremarkable. Small amount of air within the urinary bladder secondary to recent procedure. Stomach/Bowel: There is thickened appearance of the distal colon and rectosigmoid which may be reactive but concerning for colitis. Loose stool  throughout the colon compatible with diarrheal state. Correlation with clinical exam and stool cultures recommended. No bowel obstruction. The appendix is suboptimally evaluated due to surrounding fluid. Vascular/Lymphatic: Mild aortoiliac atherosclerotic disease. The IVC is unremarkable. No portal venous gas. No adenopathy. Reproductive: The prostate and seminal vesicles are grossly unremarkable. Other: Loss of subcutaneous fat. Musculoskeletal: Degenerative changes of the spine. No acute osseous pathology. IMPRESSION: 1. Interval placement of a left ureteral stent. The previously seen left renal calculus has migrated and now located at the left UPJ. Persistent mild left hydronephrosis. 2. Colitis with diarrheal state. Correlation with clinical exam and stool cultures recommended. No bowel obstruction. 3. Aortic Atherosclerosis (ICD10-I70.0). Electronically Signed   By: Anner Crete M.D.   On: 06/15/2020 20:48    ____________________________________________   PROCEDURES  Procedure(s) performed (including Critical Care):  Procedures   ____________________________________________   INITIAL IMPRESSION / ASSESSMENT AND PLAN / ED COURSE  As part of my medical decision making, I reviewed the following data within the [place] notes reviewed and incorporated, Labs reviewed, EKG interpreted, Old chart reviewed, Radiograph reviewed and Notes from prior ED visits reviewed and incorporated        Patients history and exam most consistent with constipation as an etiology for their pain.  Patients symptoms not typical for other emergent causes of abdominal pain such as, but not limited to, appendicitis, abdominal aortic aneurysm, pancreatitis, SBO, mesenteric ischemia, serious intra-abdominal bacterial illness.  Patient without red flags concerning for cancer as a constipation etiology.  Rx: Miralax  Disposition:  Patient will be discharged with strict return  precautions and follow up with primary MD within 24-48 hours for further evaluation. Patient understands that this still may have an early presentation of an emergent medical condition such as appendicitis that will require a recheck.      ____________________________________________   FINAL CLINICAL IMPRESSION(S) /  ED DIAGNOSES  Final diagnoses:  Lower abdominal pain  Drug-induced constipation     ED Discharge Orders    None       Note:  This document was prepared using Dragon voice recognition software and may include unintentional dictation errors.   Naaman Plummer, MD 06/15/20 [phone]

## 2020-06-15 NOTE — ED Notes (Signed)
Assumed care of pt upon being roomed. Denies abdominal pain, +flactulance, +BM prior to CT scan. Abdomen soft and nontender. Pt up ad lib in room. UA provided. AO x4, talking in full sentences with regular and unlabored breathing. Wife at bedside.

## 2020-06-15 NOTE — ED Triage Notes (Signed)
First RN Note: pt presents to ED via POV with wife with c/o constipation and no bowel movement since Monday, pt c/o pain, has attempted 1 saline enema, 1 soap suds enema, 1 dulcolax suppository, 1 dulcolax tablet, 1 bottle of mag citrate without relief. Per notes from wife no stool felt in rectum, no bowel movement since Monday. Pt noted to be actively vomiting upon arrival to ED.

## 2020-06-16 LAB — CULTURE, BLOOD (ROUTINE X 2)
Culture: NO GROWTH
Culture: NO GROWTH
Special Requests: ADEQUATE
Special Requests: ADEQUATE

## 2020-06-17 ENCOUNTER — Telehealth: Payer: Self-pay | Admitting: Urology

## 2020-06-17 MED ORDER — OXYCODONE-ACETAMINOPHEN 5-325 MG PO TABS: 1.0000 | ORAL_TABLET | ORAL | 0 refills | Status: DC | PRN

## 2020-06-17 NOTE — Telephone Encounter (Signed)
Notified wife and patient of Dr Cherrie Gauze comments below. Advised them that medications need to be updated by the provider that prescribed them. Questions answered. Wife and patient express understanding.

## 2020-06-17 NOTE — Telephone Encounter (Signed)
Wife, Mitzi, would like Dr Erlene Quan to call in a refill of Percocet as well as liquid morphine for break through pain. Advised that he can try alternating ibuprofen and Percocet but she would prefer to have morphine if available.  She would also like to update his medication list because he is no longer taking several medications that are in the chart, in particular his hypertension medications.

## 2020-06-17 NOTE — Telephone Encounter (Signed)
If they have access to MyChart, I believe the able to update these via [place].  If not, please help them do this.  I will refill the Percocet.  I do not give liquid morphine prescriptions, typically this is reserved for hospice type patients.  Percocet should be used for the breakthrough pain and ibuprofen and or tylenol should be used for mild pain.  Hollice Espy, MD

## 2020-06-17 NOTE — Telephone Encounter (Signed)
Patient's wife, Hatim Homann [phone]), called the office and is requesting for you to call her back this afternoon.  Patient was in the hospital for constipation (due to pain meds).  She would like to review his medication list with you prior to his next surgery with Dr. Erlene Quan on 06/24/20.  She is also requesting a refill on his pain medications (Percocet) to get him through until surgery to be send to the [facility] on [address] and [address].

## 2020-06-18 ENCOUNTER — Other Ambulatory Visit: Payer: Self-pay

## 2020-06-18 ENCOUNTER — Other Ambulatory Visit: Payer: 59

## 2020-06-18 DIAGNOSIS — N200 Calculus of kidney: Secondary | ICD-10-CM

## 2020-06-19 ENCOUNTER — Other Ambulatory Visit: Payer: Self-pay

## 2020-06-19 ENCOUNTER — Encounter
Admit: 2020-06-19 | Discharge: 2020-06-19 | Disposition: A | Discharge status: Home / Self Care | Payer: 59 | Source: Ambulatory Visit | Attending: Urology | Admitting: Urology

## 2020-06-19 DIAGNOSIS — Z01812 Encounter for preprocedural laboratory examination: Secondary | ICD-10-CM | POA: Insufficient documentation

## 2020-06-19 HISTORY — DX: Malignant (primary) neoplasm, unspecified: C80.1

## 2020-06-19 LAB — MICROSCOPIC EXAMINATION: RBC, Urine: >30 /hpf — AB (ref 0–2)

## 2020-06-19 LAB — URINALYSIS, COMPLETE
Bilirubin, UA: NEGATIVE
Glucose, UA: NEGATIVE
Nitrite, UA: NEGATIVE
Specific Gravity, UA: 1.02 (ref 1.005–1.030)
Urobilinogen, Ur: 0.2 mg/dL (ref 0.2–1.0)
pH, UA: 6 (ref 5.0–7.5)

## 2020-06-19 NOTE — Patient Instructions (Signed)
Your procedure is scheduled on: 06/24/20 Report to [place]. To find out your arrival time please call [phone] between 1PM - 3PM on 06/21/20.  Remember: Instructions that are not followed completely may result in serious medical risk, up to and including death, or upon the discretion of your surgeon and anesthesiologist your surgery may need to be rescheduled.     _X__ 1. Do not eat food after midnight the night before your procedure.                 No gum chewing or hard candies. You may drink clear liquids up to 2 hours                 before you are scheduled to arrive for your surgery- DO not drink clear                 liquids within 2 hours of the start of your surgery.                 Clear Liquids include:  water, apple juice without pulp, clear carbohydrate                 drink such as Clearfast or Gatorade, Black Coffee or Tea (Do not add                 anything to coffee or tea). Diabetics water only  __X__2.  On the morning of surgery brush your teeth with toothpaste and water, you                 may rinse your mouth with mouthwash if you wish.  Do not swallow any              toothpaste of mouthwash.     _X__ 3.  No Alcohol for 24 hours before or after surgery.   _X__ 4.  Do Not Smoke or use e-cigarettes For 24 Hours Prior to Your Surgery.                 Do not use any chewable tobacco products for at least 6 hours prior to                 surgery.  ____  5.  Bring all medications with you on the day of surgery if instructed.   __X__  6.  Notify your doctor if there is any change in your medical condition      (cold, fever, infections).     Do not wear jewelry, make-up, hairpins, clips or nail polish. Do not wear lotions, powders, or perfumes.  Do not shave 48 hours prior to surgery. Men may shave face and neck. Do not bring valuables to the hospital.    [facility] is not responsible for any belongings  or valuables.  Contacts, dentures/partials or body piercings may not be worn into surgery. Bring a case for your contacts, glasses or hearing aids, a denture cup will be supplied. Leave your suitcase in the car. After surgery it may be brought to your room. For patients admitted to the hospital, discharge time is determined by your treatment team.   Patients discharged the day of surgery will not be allowed to drive home.   Please read over the following fact sheets that you were given:   MRSA Information  __X__ Take these medicines the morning of surgery with A SIP OF WATER:  1. amLODipine (NORVASC) 2.5 MG tablet  2. escitalopram (LEXAPRO) 10 MG tablet  3. oxyCODONE-acetaminophen (PERCOCET/ROXICET) 5-325 MG tablet IF NEEDED  4.  5.  6.  ____ Fleet Enema (as directed)   ____ Use CHG Soap/SAGE wipes as directed  __X__ Use inhalers on the day of surgery  ____ Stop metformin/Janumet/Farxiga 2 days prior to surgery    ____ Take 1/2 of usual insulin dose the night before surgery. No insulin the morning          of surgery.   ____ Stop Blood Thinners Coumadin/Plavix/Xarelto/Pleta/Pradaxa/Eliquis/Effient/Aspirin  on   Or contact your Surgeon, Cardiologist or Medical Doctor regarding  ability to stop your blood thinners  __X__ Stop Anti-inflammatories 7 days before surgery such as Advil, Ibuprofen, Motrin,  BC or Goodies Powder, Naprosyn, Naproxen, Aleve    __X__ Stop all herbal supplements, fish oil or vitamin E until after surgery.    ____ Bring C-Pap to the hospital.

## 2020-06-20 ENCOUNTER — Other Ambulatory Visit
Admit: 2020-06-20 | Discharge: 2020-06-20 | Disposition: A | Discharge status: Home / Self Care | Payer: 59 | Source: Ambulatory Visit | Attending: Urology | Admitting: Urology

## 2020-06-20 DIAGNOSIS — Z20822 Contact with and (suspected) exposure to covid-19: Secondary | ICD-10-CM | POA: Insufficient documentation

## 2020-06-20 DIAGNOSIS — Z01812 Encounter for preprocedural laboratory examination: Secondary | ICD-10-CM | POA: Diagnosis present

## 2020-06-20 LAB — POTASSIUM: Potassium: 3.4 mmol/L — ABNORMAL LOW (ref 3.5–5.1)

## 2020-06-21 LAB — CULTURE, URINE COMPREHENSIVE

## 2020-06-21 LAB — SARS CORONAVIRUS 2 (TAT 6-24 HRS): SARS Coronavirus 2: NEGATIVE

## 2020-06-24 ENCOUNTER — Ambulatory Visit: Payer: 59 | Admitting: Anesthesiology

## 2020-06-24 ENCOUNTER — Encounter: Payer: Self-pay | Admitting: Urology

## 2020-06-24 ENCOUNTER — Encounter
Admission: RE | Disposition: A | Discharge status: Home / Self Care | Payer: Self-pay | Source: Home / Self Care | Attending: Urology

## 2020-06-24 ENCOUNTER — Ambulatory Visit
Admission: RE | Admit: 2020-06-24 | Discharge: 2020-06-24 | Disposition: A | Discharge status: Home / Self Care | Payer: 59 | Attending: Urology | Admitting: Urology

## 2020-06-24 ENCOUNTER — Ambulatory Visit: Payer: 59

## 2020-06-24 ENCOUNTER — Other Ambulatory Visit: Payer: Self-pay

## 2020-06-24 DIAGNOSIS — J449 Chronic obstructive pulmonary disease, unspecified: Secondary | ICD-10-CM | POA: Diagnosis not present

## 2020-06-24 DIAGNOSIS — F32A Depression, unspecified: Secondary | ICD-10-CM | POA: Insufficient documentation

## 2020-06-24 DIAGNOSIS — M1990 Unspecified osteoarthritis, unspecified site: Secondary | ICD-10-CM | POA: Insufficient documentation

## 2020-06-24 DIAGNOSIS — Z955 Presence of coronary angioplasty implant and graft: Secondary | ICD-10-CM | POA: Insufficient documentation

## 2020-06-24 DIAGNOSIS — F17200 Nicotine dependence, unspecified, uncomplicated: Secondary | ICD-10-CM | POA: Diagnosis not present

## 2020-06-24 DIAGNOSIS — Z79899 Other long term (current) drug therapy: Secondary | ICD-10-CM | POA: Diagnosis not present

## 2020-06-24 DIAGNOSIS — Z7951 Long term (current) use of inhaled steroids: Secondary | ICD-10-CM | POA: Insufficient documentation

## 2020-06-24 DIAGNOSIS — F419 Anxiety disorder, unspecified: Secondary | ICD-10-CM | POA: Diagnosis not present

## 2020-06-24 DIAGNOSIS — N400 Enlarged prostate without lower urinary tract symptoms: Secondary | ICD-10-CM | POA: Insufficient documentation

## 2020-06-24 DIAGNOSIS — I2510 Atherosclerotic heart disease of native coronary artery without angina pectoris: Secondary | ICD-10-CM | POA: Diagnosis not present

## 2020-06-24 DIAGNOSIS — I10 Essential (primary) hypertension: Secondary | ICD-10-CM | POA: Diagnosis not present

## 2020-06-24 DIAGNOSIS — N200 Calculus of kidney: Secondary | ICD-10-CM | POA: Diagnosis not present

## 2020-06-24 DIAGNOSIS — I252 Old myocardial infarction: Secondary | ICD-10-CM | POA: Diagnosis not present

## 2020-06-24 DIAGNOSIS — Z7982 Long term (current) use of aspirin: Secondary | ICD-10-CM | POA: Insufficient documentation

## 2020-06-24 HISTORY — PX: CYSTOSCOPY/URETEROSCOPY/HOLMIUM LASER/STENT PLACEMENT: SHX6546

## 2020-06-24 SURGERY — CYSTOSCOPY/URETEROSCOPY/HOLMIUM LASER/STENT PLACEMENT
Anesthesia: General | Laterality: Left

## 2020-06-24 MED ORDER — MIDAZOLAM HCL 2 MG/2ML IJ SOLN
INTRAMUSCULAR | Status: DC | PRN
  Administered 2020-06-24: 2 mg via INTRAVENOUS

## 2020-06-24 MED ORDER — CEFAZOLIN SODIUM-DEXTROSE 2-4 GM/100ML-% IV SOLN
2.0000 g | INTRAVENOUS | Status: AC
  Administered 2020-06-24: 2 g via INTRAVENOUS

## 2020-06-24 MED ORDER — ONDANSETRON HCL 4 MG/2ML IJ SOLN
INTRAMUSCULAR | Status: AC
  Filled 2020-06-24: qty 2

## 2020-06-24 MED ORDER — ONDANSETRON HCL 4 MG/2ML IJ SOLN: 4.0000 mg | Freq: Once | INTRAMUSCULAR | Status: DC | PRN

## 2020-06-24 MED ORDER — DEXAMETHASONE SODIUM PHOSPHATE 10 MG/ML IJ SOLN
INTRAMUSCULAR | Status: AC
  Filled 2020-06-24: qty 1

## 2020-06-24 MED ORDER — CHLORHEXIDINE GLUCONATE 0.12 % MT SOLN: 15.0000 mL | Freq: Once | OROMUCOSAL | Status: AC

## 2020-06-24 MED ORDER — SEVOFLURANE IN SOLN
RESPIRATORY_TRACT | Status: AC
  Filled 2020-06-24: qty 250

## 2020-06-24 MED ORDER — PROPOFOL 10 MG/ML IV BOLUS
INTRAVENOUS | Status: DC | PRN
  Administered 2020-06-24: 90 mg via INTRAVENOUS

## 2020-06-24 MED ORDER — SUGAMMADEX SODIUM 200 MG/2ML IV SOLN
INTRAVENOUS | Status: DC | PRN
  Administered 2020-06-24: 200 mg via INTRAVENOUS

## 2020-06-24 MED ORDER — IOHEXOL 180 MG/ML  SOLN
INTRAMUSCULAR | Status: DC | PRN
  Administered 2020-06-24: 20 mL

## 2020-06-24 MED ORDER — CEFAZOLIN SODIUM-DEXTROSE 2-4 GM/100ML-% IV SOLN
INTRAVENOUS | Status: AC
  Filled 2020-06-24: qty 100

## 2020-06-24 MED ORDER — DEXAMETHASONE SODIUM PHOSPHATE 10 MG/ML IJ SOLN
INTRAMUSCULAR | Status: DC | PRN
  Administered 2020-06-24: 10 mg via INTRAVENOUS

## 2020-06-24 MED ORDER — ONDANSETRON HCL 4 MG/2ML IJ SOLN
INTRAMUSCULAR | Status: DC | PRN
  Administered 2020-06-24: 4 mg via INTRAVENOUS

## 2020-06-24 MED ORDER — PROPOFOL 10 MG/ML IV BOLUS
INTRAVENOUS | Status: AC
  Filled 2020-06-24: qty 20

## 2020-06-24 MED ORDER — LIDOCAINE HCL (CARDIAC) PF 100 MG/5ML IV SOSY
PREFILLED_SYRINGE | INTRAVENOUS | Status: DC | PRN
  Administered 2020-06-24: 60 mg via INTRAVENOUS

## 2020-06-24 MED ORDER — PHENYLEPHRINE HCL (PRESSORS) 10 MG/ML IV SOLN
INTRAVENOUS | Status: AC
  Filled 2020-06-24: qty 1

## 2020-06-24 MED ORDER — LACTATED RINGERS IV SOLN: INTRAVENOUS | Status: DC

## 2020-06-24 MED ORDER — FENTANYL CITRATE (PF) 100 MCG/2ML IJ SOLN: 25.0000 ug | INTRAMUSCULAR | Status: DC | PRN

## 2020-06-24 MED ORDER — FENTANYL CITRATE (PF) 100 MCG/2ML IJ SOLN
INTRAMUSCULAR | Status: AC
  Filled 2020-06-24: qty 2

## 2020-06-24 MED ORDER — FENTANYL CITRATE (PF) 250 MCG/5ML IJ SOLN
INTRAMUSCULAR | Status: DC | PRN
Start: 2020-06-24 — End: 2020-06-24
  Administered 2020-06-24 (×2): 50 ug via INTRAVENOUS

## 2020-06-24 MED ORDER — FAMOTIDINE 20 MG PO TABS: 20.0000 mg | ORAL_TABLET | Freq: Once | ORAL | Status: AC

## 2020-06-24 MED ORDER — ORAL CARE MOUTH RINSE: 15.0000 mL | Freq: Once | OROMUCOSAL | Status: AC

## 2020-06-24 MED ORDER — GLYCOPYRROLATE 0.2 MG/ML IJ SOLN
INTRAMUSCULAR | Status: DC | PRN
  Administered 2020-06-24: .2 mg via INTRAVENOUS

## 2020-06-24 MED ORDER — LIDOCAINE HCL (PF) 2 % IJ SOLN
INTRAMUSCULAR | Status: AC
  Filled 2020-06-24: qty 5

## 2020-06-24 MED ORDER — MIDAZOLAM HCL 2 MG/2ML IJ SOLN
INTRAMUSCULAR | Status: AC
  Filled 2020-06-24: qty 2

## 2020-06-24 MED ORDER — ROCURONIUM BROMIDE 100 MG/10ML IV SOLN
INTRAVENOUS | Status: DC | PRN
  Administered 2020-06-24: 40 mg via INTRAVENOUS

## 2020-06-24 MED ORDER — FAMOTIDINE 20 MG PO TABS
ORAL_TABLET | ORAL | Status: AC
  Administered 2020-06-24: 20 mg via ORAL
  Filled 2020-06-24: qty 1

## 2020-06-24 MED ORDER — PHENYLEPHRINE HCL (PRESSORS) 10 MG/ML IV SOLN
INTRAVENOUS | Status: DC | PRN
  Administered 2020-06-24 (×3): 50 ug via INTRAVENOUS

## 2020-06-24 MED ORDER — CHLORHEXIDINE GLUCONATE 0.12 % MT SOLN
OROMUCOSAL | Status: AC
  Administered 2020-06-24: 15 mL via OROMUCOSAL
  Filled 2020-06-24: qty 15

## 2020-06-24 MED ORDER — ROCURONIUM BROMIDE 10 MG/ML (PF) SYRINGE
PREFILLED_SYRINGE | INTRAVENOUS | Status: AC
  Filled 2020-06-24: qty 10

## 2020-06-24 SURGICAL SUPPLY — 29 items
BAG DRAIN CYSTO-URO LG1000N (MISCELLANEOUS) ×2 IMPLANT
BASKET ZERO TIP 1.9FR (BASKET) IMPLANT
BRUSH SCRUB EZ 1% IODOPHOR (MISCELLANEOUS) ×2 IMPLANT
BSKT STON RTRVL ZERO TP 1.9FR (BASKET)
CATH URETL 5X70 OPEN END (CATHETERS) ×2 IMPLANT
CNTNR SPEC 2.5X3XGRAD LEK (MISCELLANEOUS)
CONT SPEC 4OZ STER OR WHT (MISCELLANEOUS)
CONT SPEC 4OZ STRL OR WHT (MISCELLANEOUS)
CONTAINER SPEC 2.5X3XGRAD LEK (MISCELLANEOUS) IMPLANT
DRAPE UTILITY 15X26 TOWEL STRL (DRAPES) ×2 IMPLANT
GLOVE BIO SURGEON STRL SZ 6.5 (GLOVE) ×3 IMPLANT
GOWN STRL REUS W/ TWL LRG LVL3 (GOWN DISPOSABLE) ×2 IMPLANT
GOWN STRL REUS W/TWL LRG LVL3 (GOWN DISPOSABLE) ×4
GUIDEWIRE GREEN .038 145CM (MISCELLANEOUS) ×1 IMPLANT
GUIDEWIRE STR DUAL SENSOR (WIRE) ×2 IMPLANT
INFUSOR MANOMETER BAG 3000ML (MISCELLANEOUS) ×2 IMPLANT
INTRODUCER DILATOR DOUBLE (INTRODUCER) ×1 IMPLANT
KIT TURNOVER CYSTO (KITS) ×2 IMPLANT
PACK CYSTO AR (MISCELLANEOUS) ×2 IMPLANT
SET CYSTO W/LG BORE CLAMP LF (SET/KITS/TRAYS/PACK) ×2 IMPLANT
SHEATH URETERAL 12FRX35CM (MISCELLANEOUS) ×1 IMPLANT
SOL .9 NS 3000ML IRR  AL (IV SOLUTION) ×2
SOL .9 NS 3000ML IRR AL (IV SOLUTION) ×1
SOL .9 NS 3000ML IRR UROMATIC (IV SOLUTION) ×1 IMPLANT
STENT URET 6FRX24 CONTOUR (STENTS) IMPLANT
STENT URET 6FRX26 CONTOUR (STENTS) IMPLANT
SURGILUBE 2OZ TUBE FLIPTOP (MISCELLANEOUS) ×2 IMPLANT
TRACTIP FLEXIVA PULSE ID 200 (Laser) ×2 IMPLANT
WATER STERILE IRR 1000ML POUR (IV SOLUTION) ×2 IMPLANT

## 2020-06-24 NOTE — Transfer of Care (Signed)
Immediate Anesthesia Transfer of Care Note  Patient: Isaiah Rangel  Procedure(s) Performed: CYSTOSCOPY/URETEROSCOPY/HOLMIUM LASER/STENT PLACEMENT (Left )  Patient Location: PACU  Anesthesia Type:General  Level of Consciousness: drowsy  Airway & Oxygen Therapy: Patient Spontanous Breathing and Patient connected to face mask oxygen  Post-op Assessment: Report given to RN and Post -op Vital signs reviewed and stable  Post vital signs: Reviewed and stable  Last Vitals:  Vitals Value Taken Time  BP 135/83 06/24/20 0834  Temp    Pulse 80 06/24/20 0834  Resp 15 06/24/20 0834  SpO2 100 % 06/24/20 0834  Vitals shown include unvalidated device data.  Last Pain:  Vitals:   06/24/20 0616  TempSrc: Temporal  PainSc: 3          Complications: No complications documented.

## 2020-06-24 NOTE — Anesthesia Procedure Notes (Signed)
Procedure Name: Intubation Date/Time: 06/24/2020 7:51 AM Performed by: Eben Burow, CRNA Pre-anesthesia Checklist: Patient identified, Emergency Drugs available, Suction available and Patient being monitored Patient Re-evaluated:Patient Re-evaluated prior to induction Oxygen Delivery Method: Circle system utilized Preoxygenation: Pre-oxygenation with 100% oxygen Induction Type: IV induction Ventilation: Mask ventilation without difficulty Laryngoscope Size: McGraph and 3 Grade View: Grade I Tube type: Oral Tube size: 7.5 mm Number of attempts: 1 Airway Equipment and Method: Stylet Placement Confirmation: ETT inserted through vocal cords under direct vision,  positive ETCO2 and breath sounds checked- equal and bilateral Secured at: 21 cm Tube secured with: Tape Dental Injury: Teeth and Oropharynx as per pre-operative assessment

## 2020-06-24 NOTE — Discharge Instructions (Addendum)
You have a ureteral stent in place.  This is a tube that extends from your kidney to your bladder.  This may cause urinary bleeding, burning with urination, and urinary frequency.  Please call our office or present to the ED if you develop fevers >101 or pain which is not able to be controlled with oral pain medications.  You may be given either Flomax and/ or ditropan to help with bladder spasms and stent pain in addition to pain medications.    You have a stent.  The string is taped to the head of your penis.  On Friday morning, you may untape this and pulled gently until the entire stent is removed.  If you have any difficulty or concerns, please call our office for assistance.  [facility] [address] [place], [place] [address] [phone]   AMBULATORY SURGERY  DISCHARGE INSTRUCTIONS   1) The drugs that you were given will stay in your system until tomorrow so for the next 24 hours you should not:  A) Drive an automobile B) Make any legal decisions C) Drink any alcoholic beverage   2) You may resume regular meals tomorrow.  Today it is better to start with liquids and gradually work up to solid foods.  You may eat anything you prefer, but it is better to start with liquids, then soup and crackers, and gradually work up to solid foods.   3) Please notify your doctor immediately if you have any unusual bleeding, trouble breathing, redness and pain at the surgery site, drainage, fever, or pain not relieved by medication.    4) Additional Instructions:        Please contact your physician with any problems or Same Day Surgery at [phone], Monday through Friday 6 am to 4 pm, or [place] at [facility] number at [phone].

## 2020-06-24 NOTE — Anesthesia Preprocedure Evaluation (Signed)
Anesthesia Evaluation  Patient identified by MRN, date of birth, ID band Patient awake    Reviewed: Allergy & Precautions, NPO status , Patient's Chart, lab work & pertinent test results  History of Anesthesia Complications Negative for: history of anesthetic complications  Airway Mallampati: II  TM Distance: >3 FB Neck ROM: Full    Dental  (+) Poor Dentition, Missing   Pulmonary COPD, Current Smoker and Patient abstained from smoking.,    breath sounds clear to auscultation- rhonchi (-) wheezing      Cardiovascular hypertension, Pt. on medications + CAD and + Past MI  (-) Cardiac Stents and (-) CABG  Rhythm:Regular Rate:Normal - Systolic murmurs and - Diastolic murmurs Echo 4/62/70: 1. Left ventricular ejection fraction, by visual estimation, is 60 to  65%. The left ventricle has normal function. There is mildly increased  left ventricular hypertrophy.  2. Left ventricular diastolic parameters are consistent with Grade I  diastolic dysfunction (impaired relaxation).  3. The left ventricle has no regional wall motion abnormalities.  4. Global right ventricle has normal systolic function.The right  ventricular size is normal. Mildly increased right ventricular wall  thickness.  5. Left atrial size was normal.  6. Right atrial size was normal.  7. The mitral valve is grossly normal. Trivial mitral valve  regurgitation.  8. The tricuspid valve is grossly normal.  9. The aortic valve is normal in structure. Aortic valve regurgitation is  not visualized. Mild aortic valve sclerosis without stenosis.  10. The pulmonic valve was grossly normal. Pulmonic valve regurgitation is  not visualized.  11. The inferior vena cava is dilated in size with >50% respiratory  variability, suggesting right atrial pressure of 8 mmHg.   L heart cath 03/20/19:  Prox RCA lesion is 30% stenosed.  Mid RCA lesion is 70% stenosed.  1st Mrg  lesion is 40% stenosed.  LV end diastolic pressure is low.   Mild to moderate CAD with a normal LAD, 40% smooth stenosis of the OM1 branch of the left circumflex coronary artery, and a dominant RCA with 30% mid narrowing, and in one view up to a 70% stenosis proximal to the acute margin.  Following IC nitroglycerin administration, the 70% stenosis improved to 40%.  LVEDP 4 mmHg    Neuro/Psych neg Seizures PSYCHIATRIC DISORDERS Anxiety Depression negative neurological ROS     GI/Hepatic Neg liver ROS, GERD  ,  Endo/Other  negative endocrine ROSneg diabetes  Renal/GU negative Renal ROS     Musculoskeletal  (+) Arthritis ,   Abdominal (+) - obese,   Peds  Hematology negative hematology ROS (+)   Anesthesia Other Findings Past Medical History: No date: Allergy No date: Anxiety No date: BPH (benign prostatic hyperplasia) No date: Cancer [facility]     Comment:  basal and squamous No date: COPD (chronic obstructive pulmonary disease) (HCC) No date: Coronary vasospasm ([place])     Comment:  a. 03/2019 Cath: RCA 39m but only 40% after intracoronary              ntg-->Imdur started. No date: History of echocardiogram     Comment:  a. 03/2019 Echo: EF 50-55%, nl RV size/fxn. Trace MR/TR. No date: Myocardial infarction [facility]) No date: Non-obstructive CAD (coronary artery disease)     Comment:  a. 03/2013 Cath: min irregs. EF >55%; b. 03/2019 Cath: LM               nl, LAD nl, LCX nl, OM1 40, RCA 30p, 14m-->40% after IC  NTG. No date: OA (osteoarthritis) No date: Tobacco abuse   Reproductive/Obstetrics                             Anesthesia Physical Anesthesia Plan  ASA: III  Anesthesia Plan: General   Post-op Pain Management:    Induction: Intravenous  PONV Risk Score and Plan: 0 and Ondansetron  Airway Management Planned: Oral ETT  Additional Equipment:   Intra-op Plan:   Post-operative Plan: Extubation in OR  Informed Consent: I  have reviewed the patients History and Physical, chart, labs and discussed the procedure including the risks, benefits and alternatives for the proposed anesthesia with the patient or authorized representative who has indicated his/her understanding and acceptance.     Dental advisory given  Plan Discussed with: CRNA and Anesthesiologist  Anesthesia Plan Comments:         Anesthesia Quick Evaluation

## 2020-06-24 NOTE — Interval H&P Note (Signed)
History and Physical Interval Note:  06/24/2020 7:25 AM  Isaiah Rangel  has presented today for surgery, with the diagnosis of left nephrolithiasis.  The various methods of treatment have been discussed with the patient and family. After consideration of risks, benefits and other options for treatment, the patient has consented to  Procedure(s): CYSTOSCOPY/URETEROSCOPY/HOLMIUM LASER/STENT PLACEMENT (Left) as a surgical intervention.  The patient's history has been reviewed, patient examined, no change in status, stable for surgery.  I have reviewed the patient's chart and labs.  Questions were answered to the patient's satisfaction.    RRR CTAB  Preop UCx with very low colony count of skin flora  Hollice Espy

## 2020-06-24 NOTE — Anesthesia Postprocedure Evaluation (Signed)
Anesthesia Post Note  Patient: Isaiah Rangel  Procedure(s) Performed: CYSTOSCOPY/URETEROSCOPY/HOLMIUM LASER/STENT PLACEMENT (Left )  Patient location during evaluation: PACU Anesthesia Type: General Level of consciousness: awake and alert and oriented Pain management: pain level controlled Vital Signs Assessment: post-procedure vital signs reviewed and stable Respiratory status: spontaneous breathing, nonlabored ventilation and respiratory function stable Cardiovascular status: blood pressure returned to baseline and stable Postop Assessment: no signs of nausea or vomiting Anesthetic complications: no   No complications documented.   Last Vitals:  Vitals:   06/24/20 0904 06/24/20 0912  BP: (!) 144/98 (!) 153/82  Pulse: 83 79  Resp: 12 14  Temp: (!) 36.3 C (!) 36.3 C  SpO2: 93% 96%    Last Pain:  Vitals:   06/24/20 0912  TempSrc: Temporal  PainSc: 0-No pain                 Santez Woodcox

## 2020-06-24 NOTE — Op Note (Signed)
Date of procedure: 06/24/20  Preoperative diagnosis:  1. Left kidney stone  Postoperative diagnosis:  1. Same as above  Procedure: 1. Left ureteroscopy with laser lithotripsy 2. Basket extraction of stone fragment 3. Left ureteral stent exchange 4. Left retrograde pyelogram 5. Interpretation of fluoroscopy less than 30 minutes  Surgeon: Hollice Espy, MD  Anesthesia: General  Complications: None  Intraoperative findings: Previously identified stone on CT now within the renal pelvis, no longer obstructing the infundibulum.  Uncomplicated laser lithotripsy with basket extraction of stone fragment.  Stent exchanged on tether.  EBL: Minimal  Specimens: Stone fragment  Drains: 6 x 26 French double-J ureteral stent on left with tether  Indication: Isaiah Rangel is a 59 y.o. patient with history of left-sided kidney stone with infundibular obstruction.  Prior to undergoing treatment for this, he was admitted for pain control and received Toradol.  We attempted ureteroscopy on that occasion but unable to advance scope the stent was placed.  He returns today for staged procedure.  After reviewing the management options for treatment, he elected to proceed with the above surgical procedure(s). We have discussed the potential benefits and risks of the procedure, side effects of the proposed treatment, the likelihood of the patient achieving the goals of the procedure, and any potential problems that might occur during the procedure or recuperation. Informed consent has been obtained.  Description of procedure:  The patient was taken to the operating room and general anesthesia was induced.  The patient was placed in the dorsal lithotomy position, prepped and draped in the usual sterile fashion, and preoperative antibiotics were administered. A preoperative time-out was performed.   A 21 French scope was advanced per urethra into the bladder.  Attention was turned to the left ureteral  orifice which a ureteral stent was seen emanating.  The coil was grasped and brought to level the urethral meatus.  It was then cannulated using a sensor wire up to the level of the kidney.  The stone was seen within the renal pelvis on scout imaging.  A dual-lumen access sheath was then used to introduce a second Super Stiff wire up to the level of the kidney.  Prior to doing so, injected Conray through the dual-lumen to create a roadmap of the kidney.  There is no contrast extravasation seen and a small filling defect was appreciated within the renal pelvis at the location of the stone.  The Super Stiff wire was then used as a working wire.  A 12/14 French Cook ureteral access sheath was advanced over this Super Stiff wire to the level of the proximal ureter under fluoroscopic guidance which went easily.  The inner cannula was removed.  A digital flexible ureteroscope was then advanced to the level of the renal pelvis through the access sheath where the stone was encountered.  It had a long almost barbell shape.  A 200 m laser fiber was then brought in using dusting settings of 0.2 J and 40 Hz, the stone was dusted into tiny fragments.  A few of the larger pieces were then extracted using 1.9 [place] tipless nitinol basket.  At the end of the procedure, no fragments greater than the tip of the laser fiber were seen.  Each every calyx was directly visualized with satisfactory stone clearance.  The scope was then backed out length of the ureter removing the access sheath along the way.  There was no ureteral injuries, or any other stone fragments identified along the course of the ureter.  Finally, the safety wire was backloaded over rigid cystoscope.  A 6 x 26 French double-J ureteral stent was backloaded over rigid cystoscope.  The wire was removed and a complete coil was created both within the renal pelvis as well as within the bladder.  The stent string was left attached to distal coil which was secured to the  patient's glans using Mastisol and Tegaderm.  He was then cleaned and dried after being reversed from anesthesia and taken to the PACU in stable condition.  Plan: He will remove his own stent on Friday.  He will follow-up with me in 4 weeks with renal ultrasound prior.  Findings were discussed with his wife Mitzie by telephone.  All questions answered.  Hollice Espy, M.D.

## 2020-06-24 NOTE — OR Nursing (Signed)
Per Dr. Erlene Quan, pt may resume aspirin today (advises pt didn't have to stop before procedure) - added resumption to d/c instructions (med section)

## 2020-06-25 ENCOUNTER — Encounter: Payer: Self-pay | Admitting: Urology

## 2020-06-25 ENCOUNTER — Other Ambulatory Visit: Payer: Self-pay | Admitting: Radiology

## 2020-06-25 DIAGNOSIS — N200 Calculus of kidney: Secondary | ICD-10-CM

## 2020-06-27 ENCOUNTER — Ambulatory Visit: Payer: 59 | Admitting: Urology

## 2020-07-01 ENCOUNTER — Telehealth: Payer: Self-pay | Admitting: *Deleted

## 2020-07-01 NOTE — Telephone Encounter (Signed)
Pt's wife calling stating that pt had cath removed on Friday and he did have blood in his urine on Friday, no blood on Saturday or Sunday and then today he has blood tinged urine and having left flank pain where the stone was before. Please advise

## 2020-07-01 NOTE — Telephone Encounter (Signed)
I am assuming you or he means the stent not foley.    Sometimes you can pass little blood clots or fragments.  Recommend supportive care, pain meds as needed, continue Flomax, copious fluids, ibuprofen.  Advised not having fevers or chills and able to get his pain under control, hopefully this will pass what ever it is.  Hold off on imaging for the time being unless pain worsens or fails to resolve.   Hollice Espy, MD

## 2020-07-01 NOTE — Telephone Encounter (Signed)
Patient aware and will continue to monitor.

## 2020-07-18 ENCOUNTER — Ambulatory Visit
Admission: RE | Admit: 2020-07-18 | Discharge: 2020-07-18 | Disposition: A | Discharge status: Home / Self Care | Payer: 59 | Source: Ambulatory Visit | Attending: Urology | Admitting: Urology

## 2020-07-18 ENCOUNTER — Other Ambulatory Visit: Payer: Self-pay

## 2020-07-18 DIAGNOSIS — N200 Calculus of kidney: Secondary | ICD-10-CM

## 2020-07-22 NOTE — Progress Notes (Signed)
07/23/2020 11:58 AM   [name] 19-Sep-1960 [id number]  Referring provider: Owens Loffler, MD [place],  [place] [address]  Chief Complaint  Patient presents with   Nephrolithiasis    HPI: 59 year old male with an obstructing upper pole infundibular stone who returns today for 4-week follow-up.  He initially developed severe left flank pain.  Was found to have an obstructing left upper pole obstructing infundibular stone.  Prior to being able to proceed with ureteroscopy, he was admitted for pain control and ultimately underwent staged ureteroscopy completed 06/24/2020.  Stent was subsequently removed.  Follow-up renal ultrasound is unremarkable.  Stone analysis was not sent.  He is completely asymptomatic today.  His symptoms resolved after stent removal.  No personal history of stones prior to this episode.  He does report that he has well water.  He has multiple occasions where he gets profoundly dehydrated in the summertime and is even required ER visits for IV fluids.  He likes to drink sweet tea.  He drinks well water which he thinks is a contributing factor.  His wife also feeds him splintage frequently.   PMH: Past Medical History:  Diagnosis Date   Allergy    Anxiety    BPH (benign prostatic hyperplasia)    Cancer (HCC)    basal and squamous   COPD (chronic obstructive pulmonary disease) ([place])    Coronary vasospasm ([place])    a. 03/2019 Cath: RCA 75m but only 40% after intracoronary ntg-->Imdur started.   History of echocardiogram    a. 03/2019 Echo: EF 50-55%, nl RV size/fxn. Trace MR/TR.   Myocardial infarction [facility])    Non-obstructive CAD (coronary artery disease)    a. 03/2013 Cath: min irregs. EF >55%; b. 03/2019 Cath: LM nl, LAD nl, LCX nl, OM1 40, RCA 30p, 95m-->40% after IC NTG.   OA (osteoarthritis)    Tobacco abuse     Surgical History: Past Surgical History:  Procedure Laterality Date   BACK SURGERY      CARDIAC CATHETERIZATION  04/06/13   [facility]- minor luminal irregularities, otherwise normal cors; EF > 55%. Medical management and smoking cessation recommended   CYSTOSCOPY/URETEROSCOPY/HOLMIUM LASER/STENT PLACEMENT Left 06/13/2020   Procedure: CYSTOSCOPY/URETEROSCOPY//STENT PLACEMENT;  Surgeon: Hollice Espy, MD;  Location: [facility] ORS;  Service: Urology;  Laterality: Left;   CYSTOSCOPY/URETEROSCOPY/HOLMIUM LASER/STENT PLACEMENT Left 06/24/2020   Procedure: CYSTOSCOPY/URETEROSCOPY/HOLMIUM LASER/STENT PLACEMENT;  Surgeon: Hollice Espy, MD;  Location: [facility] ORS;  Service: Urology;  Laterality: Left;   KNEE ARTHROSCOPY W/ PARTIAL MEDIAL MENISCECTOMY Right 2012   [place], 2012   LEFT HEART CATH AND CORONARY ANGIOGRAPHY N/A 03/20/2019   Procedure: LEFT HEART CATH AND CORONARY ANGIOGRAPHY;  Surgeon: Troy Sine, MD;  Location: [facility];  Service: Cardiovascular;  Laterality: N/A;   NASAL SINUS SURGERY     neck fusion     TONSILLECTOMY     VASECTOMY      Home Medications:  Allergies as of 07/23/2020      Reactions   Other Nausea And Vomiting, Other (See Comments)   Quail eggs caused severe stomach pain and n/v      Medication List       Accurate as of July 23, 2020 11:58 AM. If you have any questions, ask your nurse or doctor.        STOP taking these medications   oxyCODONE-acetaminophen 5-325 MG tablet Commonly known as: Percocet Stopped by: Hollice Espy, MD     TAKE these medications  albuterol 108 (90 Base) MCG/ACT inhaler Commonly known as: VENTOLIN HFA Inhale 2 puffs into the lungs every 6 (six) hours as needed for wheezing or shortness of breath.   amLODipine 2.5 MG tablet Commonly known as: NORVASC TAKE 1 TABLET BY MOUTH  DAILY   aspirin EC 81 MG tablet Take 81 mg by mouth daily.   Dulera 200-5 MCG/ACT Aero Generic drug: mometasone-formoterol Inhale 2 puffs into the lungs 2 (two) times daily.   escitalopram 10 MG tablet Commonly known as:  LEXAPRO TAKE 1 TABLET BY MOUTH  DAILY   ezetimibe 10 MG tablet Commonly known as: ZETIA TAKE 1 TABLET BY MOUTH  DAILY What changed: when to take this   Fish Oil 1000 MG Caps Take 1,000 mg by mouth daily.   fludrocortisone 0.1 MG tablet Commonly known as: FLORINEF TAKE 2 TABLETS BY MOUTH  DAILY What changed: when to take this   isosorbide mononitrate 30 MG 24 hr tablet Commonly known as: IMDUR TAKE 1 TABLET BY MOUTH  DAILY What changed: when to take this   multivitamin with minerals Tabs tablet Take 1 tablet by mouth daily.   nitroGLYCERIN 0.4 MG SL tablet Commonly known as: NITROSTAT DISSOLVE 1 TABLET UNDER THE TONGUE EVERY 5 MINUTES AS NEEDED FOR CHEST PAIN   ondansetron 4 MG tablet Commonly known as: Zofran Take 1 tablet (4 mg total) by mouth every 8 (eight) hours as needed.   potassium chloride 10 MEQ tablet Commonly known as: KLOR-CON Take by mouth.   tamsulosin 0.4 MG Caps capsule Commonly known as: FLOMAX Take by mouth.       Allergies:  Allergies  Allergen Reactions   Other Nausea And Vomiting and Other (See Comments)    Quail eggs caused severe stomach pain and n/v    Family History: Family History  Problem Relation Age of Onset   Arthritis Father    Coronary artery disease Father    Hypertension Father    CVA Father 49   Colon cancer Mother    Heart attack Brother    Heart attack Brother    Coronary artery disease Brother    Esophageal cancer Neg Hx    Stomach cancer Neg Hx    Rectal cancer Neg Hx     Social History:  reports that he has been smoking cigarettes. He has a 30.00 pack-year smoking history. He has never used smokeless tobacco. He reports that he does not drink alcohol and does not use drugs.   Physical Exam: BP 139/89    Pulse 79   Constitutional:  Alert and oriented, No acute distress. HEENT: [place] AT, moist mucus membranes.  Trachea midline, no masses. Cardiovascular: No clubbing, cyanosis, or  edema. Respiratory: Normal respiratory effort, no increased work of breathing. Skin: No rashes, bruises or suspicious lesions. Neurologic: Grossly intact, no focal deficits, moving all 4 extremities. Psychiatric: Normal mood and affect.  Pertinent Imaging:  Ultrasound renal complete  Narrative CLINICAL DATA:  Follow-up left nephrolithiasis, left ureteral stent placement 06/24/2020  EXAM: RENAL / URINARY TRACT ULTRASOUND COMPLETE  COMPARISON:  06/15/2020 CT abdomen/pelvis  FINDINGS: Right Kidney:  Renal measurements: 11.6 x 4.6 x 6.0 cm = volume: 167 mL. Echogenicity within normal limits. No mass or hydronephrosis visualized.  Left Kidney:  Renal measurements: 12.0 x 6.0 x 5.6 cm = volume: 208 mL. Echogenicity within normal limits. No hydronephrosis. Simple 2.1 x 2.1 x 2.0 cm and 1.6 x 1.2 x 1.3 cm lower left renal cysts.  Bladder:  Appears normal for  degree of bladder distention.  Other:  Ureteral jets demonstrated bilaterally in the bladder.  IMPRESSION: 1. No hydronephrosis.  Bilateral ureteral jets seen in the bladder. 2. Small simple lower left renal cysts. 3. Normal bladder.   Electronically Signed By: Ilona Sorrel M.D. On: 07/19/2020 15:58  Renal ultrasound personally reviewed today.  Agree with radiologic interpretation.  Assessment & Plan:    1. Kidney stones Status post successful left staged ureteroscopy for an obstructing left upper pole infundibular stone  Asymptomatic today  No previous history of stones  We discussed general stone prevention techniques including drinking plenty water with goal of producing 2.5 L urine daily, increased citric acid intake, avoidance of high oxalate containing foods, and decreased salt intake.  Information about dietary recommendations given today.  Plan for follow-up in 1 year with KUB or sooner as needed - Abdomen 1 view (KUB); Future  Hollice Espy, MD  [facility] [address], [place], [place] [address] [phone]

## 2020-07-23 ENCOUNTER — Other Ambulatory Visit: Payer: Self-pay

## 2020-07-23 ENCOUNTER — Ambulatory Visit (INDEPENDENT_AMBULATORY_CARE_PROVIDER_SITE_OTHER): Payer: 59 | Admitting: Urology

## 2020-07-23 ENCOUNTER — Encounter: Payer: Self-pay | Admitting: Urology

## 2020-07-23 VITALS — BP 139/89 | HR 79

## 2020-07-23 DIAGNOSIS — N200 Calculus of kidney: Secondary | ICD-10-CM | POA: Diagnosis not present

## 2020-08-07 NOTE — Progress Notes (Signed)
Patient ID: Isaiah Rangel, male   DOB: [birth year], 59 y.o.   MRN: [id number]   Cardiology Office Note  Date:  08/12/2020   ID:  BAKER MORONTA, DOB [birth year], MRN [id number]  PCP:  Owens Loffler, MD   Chief Complaint  Patient presents with  . Follow-up    6 month F/U-No new cardiac concerns. Patient unable to reconcile meds verbally-"wife takes care of my medications"    HPI:  59 year old gentleman who is very active at baseline who works as a Company secretary, smoking Who continues to smoke   GERD,  remote neck injury with fusion surgery in 2002 with chronic neck pain and tightness,  chronic chest pain symptoms, relieved with nitroglycerin previous cardiac catheterization August 2014 showing minimal coronary artery disease,  History of orthostasis, relieved with Florinef CT coronary calcium score reviewed with him, score 135 non-STEMI in the setting of coronary vasospasm  03/2019, nonobstructive disease presenting for follow-up of his CAD  Reports having a difficult several months Kidney stone, required several procedures to get the stone out  Now with eye problem, cataract Scheduled in the near future  Denies any significant chest pain concerning for unstable angina  Still smokes 1 pack/day, trying to cut back  Works in [employer]  EKG personally reviewed by myself on todays visit Shows normal sinus rhythm with rate 72 bpm no significant ST or T-wave changes  Seen in the hospital July 2020   non-STEMI in the setting of coronary vasospasm High-sensitivity troponin from 3 up to 40 up to 157 -Cardiac catheterization showing Mild to moderate CAD with a normal LAD, 40% smooth stenosis of the OM1 branch of the left circumflex coronary artery, and a dominant RCA with 30% mid narrowing, and in one view up to a 70% stenosis proximal to the acute margin. down to 40% after intracoronary nitroglycerin  03/2019 Echo: EF 50-55%, nl RV size/fxn. Trace MR/TR.   Other past  medical history reviewed In the ER 11/2017 for chest pain and diarrhea Hospital records reviewed with the patient in detail diagnosed with enteritis on CT and discharged on antibiotics.  continued profuse and frequent diarrhea.  feeling lightheaded, developed chest pains around 4 AM, took a nitroglycerin tablet but the chest pain remains so he took a second nitroglycerin tablet at that time he had a syncopal episode.  Previous GI workup was essentially negative, had EGD In the past, Ran out of isosorbide, symptoms became worse  Previously tried Levsin  PMH:   has a past medical history of Allergy, Anxiety, BPH (benign prostatic hyperplasia), Cancer ([place]), COPD (chronic obstructive pulmonary disease) ([place]), Coronary vasospasm ([place]), History of echocardiogram, Myocardial infarction ([place]), Non-obstructive CAD (coronary artery disease), OA (osteoarthritis), and Tobacco abuse.  PSH:    Past Surgical History:  Procedure Laterality Date  . BACK SURGERY    . CARDIAC CATHETERIZATION  04/06/13   [facility]- minor luminal irregularities, otherwise normal cors; EF > 55%. Medical management and smoking cessation recommended  . CYSTOSCOPY/URETEROSCOPY/HOLMIUM LASER/STENT PLACEMENT Left 06/13/2020   Procedure: CYSTOSCOPY/URETEROSCOPY//STENT PLACEMENT;  Surgeon: Hollice Espy, MD;  Location: [facility] ORS;  Service: Urology;  Laterality: Left;  . CYSTOSCOPY/URETEROSCOPY/HOLMIUM LASER/STENT PLACEMENT Left 06/24/2020   Procedure: CYSTOSCOPY/URETEROSCOPY/HOLMIUM LASER/STENT PLACEMENT;  Surgeon: Hollice Espy, MD;  Location: [facility];  Service: Urology;  Laterality: Left;  . KNEE ARTHROSCOPY W/ PARTIAL MEDIAL MENISCECTOMY Right 2012   [place], 2012  . LEFT HEART CATH AND CORONARY ANGIOGRAPHY N/A 03/20/2019   Procedure: LEFT HEART CATH AND CORONARY ANGIOGRAPHY;  Surgeon:  Troy Sine, MD;  Location: [place] [facility];  Service: Cardiovascular;  Laterality: N/A;  . NASAL SINUS SURGERY    . neck fusion    .  TONSILLECTOMY    . VASECTOMY      Current Outpatient Medications  Medication Sig Dispense Refill  . albuterol (VENTOLIN HFA) 108 (90 Base) MCG/ACT inhaler Inhale 2 puffs into the lungs every 6 (six) hours as needed for wheezing or shortness of breath.    Marland Kitchen amLODipine (NORVASC) 2.5 MG tablet TAKE 1 TABLET BY MOUTH  DAILY 90 tablet 1  . aspirin EC 81 MG tablet Take 81 mg by mouth daily.    Marland Kitchen escitalopram (LEXAPRO) 10 MG tablet TAKE 1 TABLET BY MOUTH  DAILY 90 tablet 1  . ezetimibe (ZETIA) 10 MG tablet TAKE 1 TABLET BY MOUTH  DAILY 90 tablet 3  . fludrocortisone (FLORINEF) 0.1 MG tablet TAKE 2 TABLETS BY MOUTH  DAILY 180 tablet 3  . isosorbide mononitrate (IMDUR) 30 MG 24 hr tablet TAKE 1 TABLET BY MOUTH  DAILY 90 tablet 3  . mometasone-formoterol (DULERA) 200-5 MCG/ACT AERO Inhale 2 puffs into the lungs 2 (two) times daily.    . Multiple Vitamin (MULTIVITAMIN WITH MINERALS) TABS tablet Take 1 tablet by mouth daily.    . nitroGLYCERIN (NITROSTAT) 0.4 MG SL tablet DISSOLVE 1 TABLET UNDER THE TONGUE EVERY 5 MINUTES AS NEEDED FOR CHEST PAIN 25 tablet 3  . Omega-3 Fatty Acids (FISH OIL) 1000 MG CAPS Take 1,000 mg by mouth daily.     . ondansetron (ZOFRAN) 4 MG tablet Take 1 tablet (4 mg total) by mouth every 8 (eight) hours as needed. 20 tablet 0  . potassium chloride (KLOR-CON) 10 MEQ tablet Take 10 mEq by mouth daily.     No current facility-administered medications for this visit.     Allergies:   Other   Social History:  The patient  reports that he has been smoking cigarettes. He has a 30.00 pack-year smoking history. He has never used smokeless tobacco. He reports that he does not drink alcohol and does not use drugs.   Family History:   family history includes Arthritis in his father; CVA (age of onset: 70) in his father; Colon cancer in his mother; Coronary artery disease in his brother and father; Heart attack in his brother and brother; Hypertension in his father.    Review of  Systems: Review of Systems  Constitutional: Negative.   HENT: Negative.   Respiratory: Negative.   Cardiovascular: Positive for chest pain.  Gastrointestinal: Negative.   Musculoskeletal: Negative.   Neurological: Negative.   Psychiatric/Behavioral: Negative.   All other systems reviewed and are negative.   PHYSICAL EXAM: VS:  BP 120/74 (BP Location: Left Arm, Patient Position: Sitting, Cuff Size: Normal)   Pulse 72   Ht 5\' 5"  (1.651 m)   Wt 135 lb (61.2 kg)   SpO2 96%   BMI 22.47 kg/m  , BMI Body mass index is 22.47 kg/m. Constitutional:  oriented to person, place, and time. No distress.  HENT:  Head: Grossly normal Eyes:  no discharge. No scleral icterus.  Neck: No JVD, no carotid bruits  Cardiovascular: Regular rate and rhythm, no murmurs appreciated Pulmonary/Chest: Clear to auscultation bilaterally, no wheezes or rails Abdominal: Soft.  no distension.  no tenderness.  Musculoskeletal: Normal range of motion Neurological:  normal muscle tone. Coordination normal. No atrophy Skin: Skin warm and dry Psychiatric: normal affect, pleasant   Recent Labs: 01/04/2020: TSH 3.355  06/11/2020: B Natriuretic Peptide 56.2 06/15/2020: ALT 19; BUN 14; Creatinine, Ser 0.87; Hemoglobin 13.5; Platelets 256; Sodium 144 06/20/2020: Potassium 3.4    Lipid Panel Lab Results  Component Value Date   CHOL 143 09/17/2019   HDL 33 (L) 09/17/2019   LDLCALC 100 (H) 09/17/2019   TRIG 52 09/17/2019      Wt Readings from Last 3 Encounters:  08/12/20 135 lb (61.2 kg)  06/24/20 125 lb (56.7 kg)  06/19/20 125 lb (56.7 kg)    ASSESSMENT AND PLAN:  NSTEMI, CAD with chronic stable angina July 2020 catheterization in the hospital, confirmed diagnosed with coronary spasm, nonobstructive Smoking cessation recommended  Tobacco abuse We have encouraged him to continue to work on weaning his cigarettes and smoking cessation. He will continue to work on this and does not want any assistance  with chantix.   Orthostasis Difficulty maintaining his body weight Has been taking Florinef  This is allowing titration of his amlodipine,  Imdur for his spasm No orthostasis symptoms  Hyperlipidemia On prior clinic visit was tolerating Lipitor 80 Not on his list today, new prescription sent in    Total encounter time more than 25 minutes  Greater than 50% was spent in counseling and coordination of care with the patient    No orders of the defined types were placed in this encounter.     Signed, Esmond Plants, M.D., Ph.D. 08/12/2020  [place], [place]

## 2020-08-08 ENCOUNTER — Other Ambulatory Visit: Payer: Self-pay | Admitting: Family

## 2020-08-08 DIAGNOSIS — I951 Orthostatic hypotension: Secondary | ICD-10-CM

## 2020-08-12 ENCOUNTER — Other Ambulatory Visit: Payer: Self-pay

## 2020-08-12 ENCOUNTER — Encounter: Payer: Self-pay | Admitting: Cardiovascular Disease

## 2020-08-12 ENCOUNTER — Ambulatory Visit (INDEPENDENT_AMBULATORY_CARE_PROVIDER_SITE_OTHER): Payer: 59 | Admitting: Cardiovascular Disease

## 2020-08-12 VITALS — BP 120/74 | HR 72 | Ht 65.0 in | Wt 135.0 lb

## 2020-08-12 DIAGNOSIS — J449 Chronic obstructive pulmonary disease, unspecified: Secondary | ICD-10-CM | POA: Diagnosis not present

## 2020-08-12 DIAGNOSIS — R079 Chest pain, unspecified: Secondary | ICD-10-CM

## 2020-08-12 DIAGNOSIS — Z720 Tobacco use: Secondary | ICD-10-CM

## 2020-08-12 DIAGNOSIS — I201 Angina pectoris with documented spasm: Secondary | ICD-10-CM | POA: Diagnosis not present

## 2020-08-12 DIAGNOSIS — I951 Orthostatic hypotension: Secondary | ICD-10-CM

## 2020-08-12 DIAGNOSIS — I25118 Atherosclerotic heart disease of native coronary artery with other forms of angina pectoris: Secondary | ICD-10-CM | POA: Diagnosis not present

## 2020-08-12 DIAGNOSIS — E785 Hyperlipidemia, unspecified: Secondary | ICD-10-CM

## 2020-08-12 MED ORDER — ATORVASTATIN CALCIUM 80 MG PO TABS: 80.0000 mg | ORAL_TABLET | Freq: Every day | ORAL | 3 refills | Status: DC

## 2020-08-12 MED ORDER — DULERA 200-5 MCG/ACT IN AERO: 2.0000 | INHALATION_SPRAY | Freq: Two times a day (BID) | RESPIRATORY_TRACT | 6 refills | Status: DC

## 2020-08-12 NOTE — Patient Instructions (Signed)
Medication Instructions:  No changes  If you need a refill on your cardiac medications before your next appointment, please call your pharmacy.    Lab work: No new labs needed   If you have labs (blood work) drawn today and your tests are completely normal, you will receive your results only by: . MyChart Message (if you have MyChart) OR . A paper copy in the mail If you have any lab test that is abnormal or we need to change your treatment, we will call you to review the results.   Testing/Procedures: No new testing needed   Follow-Up: At [facility], you and your health needs are our priority.  As part of our continuing mission to provide you with exceptional heart care, we have created designated Provider Care Teams.  These Care Teams include your primary Cardiologist (physician) and Advanced Practice Providers (APPs -  Physician Assistants and Nurse Practitioners) who all work together to provide you with the care you need, when you need it.  . You will need a follow up appointment in 12 months  . Providers on your designated Care Team:   . Christopher Berge, NP . Ryan Dunn, PA-C . Jacquelyn Visser, PA-C  Any Other Special Instructions Will Be Listed Below (If Applicable).  COVID-19 Vaccine Information can be found at: [email or web address] For questions related to vaccine distribution or appointments, please email [email or web address] or call [phone].     

## 2020-08-21 ENCOUNTER — Other Ambulatory Visit: Payer: Self-pay

## 2020-08-21 ENCOUNTER — Encounter: Payer: Self-pay | Admitting: Ophthalmology

## 2020-08-26 ENCOUNTER — Other Ambulatory Visit
Admission: RE | Admit: 2020-08-26 | Discharge: 2020-08-26 | Disposition: A | Discharge status: Home / Self Care | Payer: 59 | Source: Ambulatory Visit | Attending: Ophthalmology | Admitting: Ophthalmology

## 2020-08-26 ENCOUNTER — Other Ambulatory Visit: Payer: Self-pay

## 2020-08-26 DIAGNOSIS — Z20822 Contact with and (suspected) exposure to covid-19: Secondary | ICD-10-CM | POA: Diagnosis not present

## 2020-08-26 DIAGNOSIS — Z01812 Encounter for preprocedural laboratory examination: Secondary | ICD-10-CM | POA: Diagnosis not present

## 2020-08-27 LAB — SARS CORONAVIRUS 2 (TAT 6-24 HRS): SARS Coronavirus 2: NEGATIVE

## 2020-08-27 NOTE — Anesthesia Preprocedure Evaluation (Addendum)
Anesthesia Evaluation  Patient identified by MRN, date of birth, ID band Patient awake    Reviewed: Allergy & Precautions, NPO status , Patient's Chart, lab work & pertinent test results  History of Anesthesia Complications Negative for: history of anesthetic complications  Airway Mallampati: II   Neck ROM: Full    Dental   Missing many teeth:   Pulmonary COPD, Current Smoker (1 ppd)Patient did not abstain from smoking.,    Pulmonary exam normal breath sounds clear to auscultation       Cardiovascular hypertension, + CAD (nonobstructive, s/p MI 03/2019 due to coronary vasospasm)  Normal cardiovascular exam Rhythm:Regular Rate:Normal     Neuro/Psych PSYCHIATRIC DISORDERS Anxiety Depression negative neurological ROS     GI/Hepatic GERD  ,  Endo/Other  negative endocrine ROS  Renal/GU Renal disease (nephrolithiasis)     Musculoskeletal  (+) Arthritis ,   Abdominal   Peds  Hematology negative hematology ROS (+)   Anesthesia Other Findings Cardiology note 08/12/20:  ASSESSMENT AND PLAN:  NSTEMI, CAD with chronic stable angina July 2020 catheterization in the hospital, confirmed diagnosed with coronary spasm, nonobstructive Smoking cessation recommended  Tobacco abuse We have encouraged him to continue to work on weaning his cigarettes and smoking cessation. He will continue to work on this and does not want any assistance with chantix.   Orthostasis Difficulty maintaining his body weight Has been taking Florinef  This is allowing titration of his amlodipine,  Imdur for his spasm No orthostasis symptoms  Hyperlipidemia On prior clinic visit was tolerating Lipitor 80 Not on his list today, new prescription sent in  Reproductive/Obstetrics                            Anesthesia Physical Anesthesia Plan  ASA: III  Anesthesia Plan: MAC   Post-op Pain Management:    Induction:  Intravenous  PONV Risk Score and Plan: 0 and TIVA, Midazolam and Treatment may vary due to age or medical condition  Airway Management Planned: Nasal Cannula  Additional Equipment:   Intra-op Plan:   Post-operative Plan:   Informed Consent: I have reviewed the patients History and Physical, chart, labs and discussed the procedure including the risks, benefits and alternatives for the proposed anesthesia with the patient or authorized representative who has indicated his/her understanding and acceptance.       Plan Discussed with: CRNA  Anesthesia Plan Comments:        Anesthesia Quick Evaluation

## 2020-08-27 NOTE — Discharge Instructions (Signed)

## 2020-08-28 ENCOUNTER — Ambulatory Visit: Payer: 59 | Admitting: Anesthesiology

## 2020-08-28 ENCOUNTER — Encounter
Admission: RE | Disposition: A | Discharge status: Home / Self Care | Payer: Self-pay | Source: Home / Self Care | Attending: Ophthalmology

## 2020-08-28 ENCOUNTER — Ambulatory Visit
Admission: RE | Admit: 2020-08-28 | Discharge: 2020-08-28 | Disposition: A | Discharge status: Home / Self Care | Payer: 59 | Attending: Ophthalmology | Admitting: Ophthalmology

## 2020-08-28 ENCOUNTER — Other Ambulatory Visit: Payer: Self-pay

## 2020-08-28 ENCOUNTER — Encounter: Payer: Self-pay | Admitting: Ophthalmology

## 2020-08-28 DIAGNOSIS — Z7982 Long term (current) use of aspirin: Secondary | ICD-10-CM | POA: Insufficient documentation

## 2020-08-28 DIAGNOSIS — Z79899 Other long term (current) drug therapy: Secondary | ICD-10-CM | POA: Insufficient documentation

## 2020-08-28 DIAGNOSIS — H2511 Age-related nuclear cataract, right eye: Secondary | ICD-10-CM | POA: Diagnosis not present

## 2020-08-28 DIAGNOSIS — Z7951 Long term (current) use of inhaled steroids: Secondary | ICD-10-CM | POA: Insufficient documentation

## 2020-08-28 HISTORY — PX: CATARACT EXTRACTION W/PHACO: SHX586

## 2020-08-28 SURGERY — PHACOEMULSIFICATION, CATARACT, WITH IOL INSERTION
Anesthesia: Monitor Anesthesia Care | Site: Eye | Laterality: Right

## 2020-08-28 MED ORDER — CEFUROXIME OPHTHALMIC INJECTION 1 MG/0.1 ML
INJECTION | OPHTHALMIC | Status: DC | PRN
  Administered 2020-08-28: 0.1 mL via INTRACAMERAL

## 2020-08-28 MED ORDER — LACTATED RINGERS IV SOLN: INTRAVENOUS | Status: DC

## 2020-08-28 MED ORDER — LIDOCAINE HCL (PF) 2 % IJ SOLN
INTRAOCULAR | Status: DC | PRN
  Administered 2020-08-28: 1 mL

## 2020-08-28 MED ORDER — EPINEPHRINE PF 1 MG/ML IJ SOLN
INTRAOCULAR | Status: DC | PRN
  Administered 2020-08-28: 41 mL via OPHTHALMIC

## 2020-08-28 MED ORDER — ARMC OPHTHALMIC DILATING DROPS
1.0000 "application " | OPHTHALMIC | Status: DC | PRN
  Administered 2020-08-28 (×3): 1 via OPHTHALMIC

## 2020-08-28 MED ORDER — NA HYALUR & NA CHOND-NA HYALUR 0.4-0.35 ML IO KIT
PACK | INTRAOCULAR | Status: DC | PRN
  Administered 2020-08-28: 1 mL via INTRAOCULAR

## 2020-08-28 MED ORDER — FENTANYL CITRATE (PF) 100 MCG/2ML IJ SOLN
INTRAMUSCULAR | Status: DC | PRN
  Administered 2020-08-28 (×2): 50 ug via INTRAVENOUS

## 2020-08-28 MED ORDER — BRIMONIDINE TARTRATE-TIMOLOL 0.2-0.5 % OP SOLN
OPHTHALMIC | Status: DC | PRN
  Administered 2020-08-28: 1 [drp] via OPHTHALMIC

## 2020-08-28 MED ORDER — MIDAZOLAM HCL 2 MG/2ML IJ SOLN
INTRAMUSCULAR | Status: DC | PRN
  Administered 2020-08-28: 2 mg via INTRAVENOUS

## 2020-08-28 MED ORDER — TETRACAINE HCL 0.5 % OP SOLN
1.0000 [drp] | OPHTHALMIC | Status: DC | PRN
  Administered 2020-08-28 (×3): 1 [drp] via OPHTHALMIC

## 2020-08-28 SURGICAL SUPPLY — 30 items
CANNULA ANT/CHMB 27G (MISCELLANEOUS) ×1 IMPLANT
CANNULA ANT/CHMB 27GA (MISCELLANEOUS) ×2 IMPLANT
GLOVE SURG LX 7.5 STRW (GLOVE) ×2
GLOVE SURG LX STRL 7.5 STRW (GLOVE) ×1 IMPLANT
GLOVE SURG TRIUMPH 8.0 PF LTX (GLOVE) ×2 IMPLANT
GOWN STRL REUS W/ TWL LRG LVL3 (GOWN DISPOSABLE) ×2 IMPLANT
GOWN STRL REUS W/TWL LRG LVL3 (GOWN DISPOSABLE) ×4
LENS IOL EYHANCE TORIC II 18.0 ×2 IMPLANT
LENS IOL EYHANCE TRC 300 18.0 IMPLANT
LENS IOL EYHNC TORIC 300 18.0 ×1 IMPLANT
MARKER SKIN DUAL TIP RULER LAB (MISCELLANEOUS) ×2 IMPLANT
NDL CAPSULORHEX 25GA (NEEDLE) ×1 IMPLANT
NDL FILTER BLUNT 18X1 1/2 (NEEDLE) ×2 IMPLANT
NDL RETROBULBAR .5 NSTRL (NEEDLE) IMPLANT
NEEDLE CAPSULORHEX 25GA (NEEDLE) ×2 IMPLANT
NEEDLE FILTER BLUNT 18X 1/2SAF (NEEDLE) ×2
NEEDLE FILTER BLUNT 18X1 1/2 (NEEDLE) ×2 IMPLANT
PACK CATARACT BRASINGTON (MISCELLANEOUS) ×2 IMPLANT
PACK EYE AFTER SURG (MISCELLANEOUS) ×2 IMPLANT
PACK OPTHALMIC (MISCELLANEOUS) ×2 IMPLANT
RING MALYGIN 7.0 (MISCELLANEOUS) IMPLANT
SOLUTION OPHTHALMIC SALT (MISCELLANEOUS) ×2 IMPLANT
SUT ETHILON 10-0 CS-B-6CS-B-6 (SUTURE)
SUT VICRYL  9 0 (SUTURE)
SUT VICRYL 9 0 (SUTURE) IMPLANT
SUTURE EHLN 10-0 CS-B-6CS-B-6 (SUTURE) IMPLANT
SYR 3ML LL SCALE MARK (SYRINGE) ×4 IMPLANT
SYR TB 1ML LUER SLIP (SYRINGE) ×2 IMPLANT
WATER STERILE IRR 250ML POUR (IV SOLUTION) ×2 IMPLANT
WIPE NON LINTING 3.25X3.25 (MISCELLANEOUS) ×2 IMPLANT

## 2020-08-28 NOTE — Anesthesia Postprocedure Evaluation (Signed)
Anesthesia Post Note  Patient: Isaiah Rangel  Procedure(s) Performed: CATARACT EXTRACTION PHACO AND INTRAOCULAR LENS PLACEMENT ([place]) RIGHT EYHANCE TORIC (Right Eye)     Patient location during evaluation: PACU Anesthesia Type: MAC Level of consciousness: awake and alert, oriented and patient cooperative Pain management: pain level controlled Vital Signs Assessment: post-procedure vital signs reviewed and stable Respiratory status: spontaneous breathing, nonlabored ventilation and respiratory function stable Cardiovascular status: blood pressure returned to baseline and stable Postop Assessment: adequate PO intake Anesthetic complications: no   No complications documented.  Darrin Nipper

## 2020-08-28 NOTE — H&P (Signed)

## 2020-08-28 NOTE — Transfer of Care (Signed)
Immediate Anesthesia Transfer of Care Note  Patient: Isaiah Rangel  Procedure(s) Performed: CATARACT EXTRACTION PHACO AND INTRAOCULAR LENS PLACEMENT ([facility]) RIGHT EYHANCE TORIC (Right Eye)  Patient Location: PACU  Anesthesia Type: MAC  Level of Consciousness: awake, alert  and patient cooperative  Airway and Oxygen Therapy: Patient Spontanous Breathing and Patient connected to supplemental oxygen  Post-op Assessment: Post-op Vital signs reviewed, Patient's Cardiovascular Status Stable, Respiratory Function Stable, Patent Airway and No signs of Nausea or vomiting  Post-op Vital Signs: Reviewed and stable  Complications: No complications documented.

## 2020-08-28 NOTE — Anesthesia Procedure Notes (Signed)
Procedure Name: MAC Date/Time: 08/28/2020 12:27 PM Performed by: Jeannene Patella, CRNA Pre-anesthesia Checklist: Patient identified, Emergency Drugs available, Suction available, Timeout performed and Patient being monitored Patient Re-evaluated:Patient Re-evaluated prior to induction Oxygen Delivery Method: Nasal cannula Placement Confirmation: positive ETCO2

## 2020-08-28 NOTE — Op Note (Signed)
LOCATION:  [facility]   PREOPERATIVE DIAGNOSIS:  Nuclear sclerotic cataract of the right eye.  H25.11   POSTOPERATIVE DIAGNOSIS:  Nuclear sclerotic cataract of the right eye.   PROCEDURE:  Phacoemulsification with Toric posterior chamber intraocular lens placement of the right eye.  Ultrasound time: Procedure(s) with comments: CATARACT EXTRACTION PHACO AND INTRAOCULAR LENS PLACEMENT ([facility]) RIGHT EYHANCE TORIC (Right) - 1.77 0:31.9 5.6%  LENS:   Implant Name Type Inv. Item Serial No. Manufacturer Lot No. LRB No. Used Action  LENS IOL EYHANCE TORIC II 18.0 - [id number]  LENS IOL EYHANCE TORIC II 18.0 [id number]   Right 1 Implanted     [id number] Toric intraocular lens with 3.0 diopters of cylindrical power with axis orientation at 177 degrees.     SURGEON:  Deirdre Evener, MD   ANESTHESIA: Topical with tetracaine drops and 2% Xylocaine jelly, augmented with 1% preservative-free intracameral lidocaine. .   COMPLICATIONS:  None.   DESCRIPTION OF PROCEDURE:  The patient was identified in the holding room and transported to the operating suite and placed in the supine position under the operating microscope.  The right eye was identified as the operative eye, and it was prepped and draped in the usual sterile ophthalmic fashion.    A clear-corneal paracentesis incision was made at the 12:00 position.  0.5 ml of preservative-free 1% lidocaine was injected into the anterior chamber. The anterior chamber was filled with Viscoat.  A 2.4 millimeter near clear corneal incision was then made at the 9:00 position.  A cystotome and capsulorrhexis forceps were then used to make a curvilinear capsulorrhexis.  Hydrodissection and hydrodelineation were then performed using balanced salt solution.   Phacoemulsification was then used in stop and chop fashion to remove the lens, nucleus and epinucleus.  The remaining cortex was aspirated using the irrigation and aspiration handpiece.   Provisc viscoelastic was then placed into the capsular bag to distend it for lens placement.  The Verion digital marker was used to align the implant at the intended axis.   A Toric lens was then injected into the capsular bag.  It was rotated clockwise until the axis marks on the lens were approximately 15 degrees in the counterclockwise direction to the intended alignment.  The viscoelastic was aspirated from the eye using the irrigation aspiration handpiece.  Then, a Koch spatula through the sideport incision was used to rotate the lens in a clockwise direction until the axis markings of the intraocular lens were lined up with the Verion alignment.  Balanced salt solution was then used to hydrate the wounds. Cefuroxime 0.1 ml of a 10mg /ml solution was injected into the anterior chamber for a dose of 1 mg of intracameral antibiotic at the completion of the case.    The eye was noted to have a physiologic pressure and there was no wound leak noted.   Timolol and Brimonidine drops were applied to the eye.  The patient was taken to the recovery room in stable condition having had no complications of anesthesia or surgery.  Adwoa Axe 08/28/2020, 12:45 PM

## 2020-08-29 ENCOUNTER — Encounter: Payer: Self-pay | Admitting: Ophthalmology

## 2020-09-24 ENCOUNTER — Other Ambulatory Visit: Payer: Self-pay | Admitting: Cardiovascular Disease

## 2020-10-21 ENCOUNTER — Other Ambulatory Visit: Payer: Self-pay | Admitting: Family Medicine

## 2020-10-22 NOTE — Telephone Encounter (Signed)
Left voice message to call the office  

## 2020-10-22 NOTE — Telephone Encounter (Signed)
Please schedule CPE with fasting labs prior with Dr. [name].  

## 2020-10-23 ENCOUNTER — Other Ambulatory Visit (INDEPENDENT_AMBULATORY_CARE_PROVIDER_SITE_OTHER): Payer: 59

## 2020-10-23 ENCOUNTER — Other Ambulatory Visit: Payer: Self-pay

## 2020-10-23 DIAGNOSIS — E785 Hyperlipidemia, unspecified: Secondary | ICD-10-CM

## 2020-10-23 DIAGNOSIS — Z79899 Other long term (current) drug therapy: Secondary | ICD-10-CM

## 2020-10-23 DIAGNOSIS — Z131 Encounter for screening for diabetes mellitus: Secondary | ICD-10-CM | POA: Diagnosis not present

## 2020-10-23 DIAGNOSIS — Z125 Encounter for screening for malignant neoplasm of prostate: Secondary | ICD-10-CM

## 2020-10-23 LAB — CBC WITH DIFFERENTIAL/PLATELET
Basophils Absolute: 0.1 10*3/uL (ref 0.0–0.1)
Basophils Relative: 1.2 % (ref 0.0–3.0)
Eosinophils Absolute: 0.3 10*3/uL (ref 0.0–0.7)
Eosinophils Relative: 3.1 % (ref 0.0–5.0)
HCT: 42.3 % (ref 39.0–52.0)
Hemoglobin: 14.4 g/dL (ref 13.0–17.0)
Lymphocytes Relative: 31.2 % (ref 12.0–46.0)
Lymphs Abs: 2.7 10*3/uL (ref 0.7–4.0)
MCHC: 34.1 g/dL (ref 30.0–36.0)
MCV: 92.4 fl (ref 78.0–100.0)
Monocytes Absolute: 0.8 10*3/uL (ref 0.1–1.0)
Monocytes Relative: 8.9 % (ref 3.0–12.0)
Neutro Abs: 4.9 10*3/uL (ref 1.4–7.7)
Neutrophils Relative %: 55.6 % (ref 43.0–77.0)
Platelets: 223 10*3/uL (ref 150.0–400.0)
RBC: 4.58 Mil/uL (ref 4.22–5.81)
RDW: 17.1 % — ABNORMAL HIGH (ref 11.5–15.5)
WBC: 8.8 10*3/uL (ref 4.0–10.5)

## 2020-10-23 LAB — BASIC METABOLIC PANEL
BUN: 13 mg/dL (ref 6–23)
CO2: 33 mEq/L — ABNORMAL HIGH (ref 19–32)
Calcium: 9.1 mg/dL (ref 8.4–10.5)
Chloride: 104 mEq/L (ref 96–112)
Creatinine, Ser: 0.88 mg/dL (ref 0.40–1.50)
GFR: 93.43 mL/min (ref >60.00)
Glucose, Bld: 102 mg/dL — ABNORMAL HIGH (ref 70–99)
Potassium: 3.4 mEq/L — ABNORMAL LOW (ref 3.5–5.1)
Sodium: 142 mEq/L (ref 135–145)

## 2020-10-23 LAB — HEPATIC FUNCTION PANEL
ALT: 12 U/L (ref 0–53)
AST: 15 U/L (ref 0–37)
Albumin: 4.4 g/dL (ref 3.5–5.2)
Alkaline Phosphatase: 75 U/L (ref 39–117)
Bilirubin, Direct: 0.1 mg/dL (ref 0.0–0.3)
Total Bilirubin: 0.5 mg/dL (ref 0.2–1.2)
Total Protein: 6.8 g/dL (ref 6.0–8.3)

## 2020-10-23 LAB — LIPID PANEL
Cholesterol: 155 mg/dL (ref 0–200)
HDL: 45.9 mg/dL (ref >39.00)
LDL Cholesterol: 96 mg/dL (ref 0–99)
NonHDL: 108.81
Total CHOL/HDL Ratio: 3
Triglycerides: 64 mg/dL (ref 0.0–149.0)
VLDL: 12.8 mg/dL (ref 0.0–40.0)

## 2020-10-23 LAB — HEMOGLOBIN A1C: Hgb A1c MFr Bld: 6.3 % (ref 4.6–6.5)

## 2020-10-24 LAB — PSA, TOTAL WITH REFLEX TO PSA, FREE: PSA, Total: 0.6 ng/mL (ref <=4.0)

## 2020-10-30 ENCOUNTER — Ambulatory Visit: Payer: 59 | Admitting: Family Medicine

## 2020-11-05 ENCOUNTER — Telehealth: Payer: Self-pay | Admitting: Cardiovascular Disease

## 2020-11-05 NOTE — Telephone Encounter (Signed)
   [facility] Pre-operative Risk Assessment    HEARTCARE STAFF: - Please ensure there is not already an duplicate clearance open for this procedure. - Under Visit Info/Reason for Call, type in Other and utilize the format Clearance MM/DD/YY or Clearance TBD. Do not use dashes or single digits. - If request is for dental extraction, please clarify the # of teeth to be extracted.  Request for surgical clearance:  1. What type of surgery is being performed? LT knee arthroscopy   2. When is this surgery scheduled? 11/13/20  3. What type of clearance is required (medical clearance vs. Pharmacy clearance to hold med vs. Both)? both  4. Are there any medications that need to be held prior to surgery and how long? Not listed, please advise if needed  5. Practice name and name of physician performing surgery? [facility] - [facility] - Dr Kurtis Bushman  6. What is the office phone number? [phone] ext. [phone]   7.   What is the office fax number? [place]: Theodora Blow  8.   Anesthesia type (None, local, MAC, general) ? General    Ace Gins 11/05/2020, 10:51 AM  _________________________________________________________________   (provider comments below)

## 2020-11-05 NOTE — Telephone Encounter (Signed)
   Primary Cardiologist: Ida Rogue, MD  Chart reviewed as part of pre-operative protocol coverage. Patient was contacted 11/05/2020 in reference to pre-operative risk assessment for pending surgery as outlined below.  Isaiah Rangel was last seen on 08/12/20 by Dr. Rockey Situ.  Since that day, Isaiah Rangel has done well. He can complete more than 4.0 METS without angina. Heart cath in 2020 showed nonobstructive disease. We prefer continuation of ASA throughout the perioperative period. However, if doing so will increase morbidity or mortality, may hold 5-7 days.   Therefore, based on ACC/AHA guidelines, the patient would be at acceptable risk for the planned procedure without further cardiovascular testing.   The patient was advised that if he develops new symptoms prior to surgery to contact our office to arrange for a follow-up visit, and he verbalized understanding.  I will route this recommendation to the requesting party via Epic fax function and remove from pre-op pool. Please call with questions.  Tami Lin Jathen Sudano, PA 11/05/2020, 2:27 PM

## 2020-11-07 ENCOUNTER — Encounter: Payer: Self-pay | Admitting: Orthopedic Surgery

## 2020-11-07 ENCOUNTER — Other Ambulatory Visit: Payer: Self-pay | Admitting: Orthopedic Surgery

## 2020-11-07 NOTE — H&P (Deleted)
  The note originally documented on this encounter has been moved the the encounter in which it belongs.  

## 2020-11-07 NOTE — H&P (Signed)
Isaiah Rangel MRN:  [id number] DOB/SEX:  1960/12/11/male  CHIEF COMPLAINT:  Painful left Knee  HISTORY: Patient is a 60 y.o. male presented with a history of pain in the left knee. Onset of symptoms was abrupt starting several weeks ago with gradually worsening course since that time. Prior procedures on the knee include none. Patient has been treated conservatively with over-the-counter NSAIDs and activity modification. Patient currently rates pain in the knee at 9 out of 10 with activity. There is no pain at night.  PAST MEDICAL HISTORY: Patient Active Problem List   Diagnosis Date Noted  . Nephrolithiasis 06/13/2020  . Kidney stone 06/11/2020  . Nausea and vomiting 06/11/2020  . Orthostatic hypotension 06/11/2020  . CAD (coronary artery disease)   . Depression   . AKI (acute kidney injury) ([place])   . Leukocytosis   . NSTEMI (non-ST elevated myocardial infarction) ([place]) 03/20/2019  . COPD (chronic obstructive pulmonary disease) with chronic bronchitis ([place]) 09/05/2018  . Acute ischemic enteritis ([place]) 11/26/2017  . Coronary artery disease involving native coronary artery of native heart without angina pectoris 02/21/2016  . Cerebrovascular disease 04/29/2015  . Radicular pain of right lower back 12/05/2013  . S/P cardiac catheterization 04/19/2013  . Smoking 09/09/2011  . Mixed hyperlipidemia 04/09/2010  . GERD 09/21/2008  . ERECTILE DYSFUNCTION 09/20/2008   Past Medical History:  Diagnosis Date  . Allergy   . Anxiety   . BPH (benign prostatic hyperplasia)   . Cancer (HCC)    basal and squamous  . COPD (chronic obstructive pulmonary disease) ([place])   . Coronary vasospasm ([place])    a. 03/2019 Cath: RCA 100m but only 40% after intracoronary ntg-->Imdur started.  Marland Kitchen History of echocardiogram    a. 03/2019 Echo: EF 50-55%, nl RV size/fxn. Trace MR/TR.  Marland Kitchen Myocardial infarction ([place]) 03/2019  . Non-obstructive CAD (coronary artery disease)    a. 03/2013 Cath: min irregs. EF >55%; b.  03/2019 Cath: LM nl, LAD nl, LCX nl, OM1 40, RCA 30p, 33m-->40% after IC NTG.  . OA (osteoarthritis)   . Tobacco abuse    Past Surgical History:  Procedure Laterality Date  . BACK SURGERY    . CARDIAC CATHETERIZATION  04/06/13   [facility]- minor luminal irregularities, otherwise normal cors; EF > 55%. Medical management and smoking cessation recommended  . CATARACT EXTRACTION W/PHACO Right 08/28/2020   Procedure: CATARACT EXTRACTION PHACO AND INTRAOCULAR LENS PLACEMENT ([place]) RIGHT Tyler Deis;  Surgeon: Leandrew Koyanagi, MD;  Location: [facility];  Service: Ophthalmology;  Laterality: Right;  1.77 0:31.9 5.6%  . CYSTOSCOPY/URETEROSCOPY/HOLMIUM LASER/STENT PLACEMENT Left 06/13/2020   Procedure: CYSTOSCOPY/URETEROSCOPY//STENT PLACEMENT;  Surgeon: Hollice Espy, MD;  Location: [facility];  Service: Urology;  Laterality: Left;  . CYSTOSCOPY/URETEROSCOPY/HOLMIUM LASER/STENT PLACEMENT Left 06/24/2020   Procedure: CYSTOSCOPY/URETEROSCOPY/HOLMIUM LASER/STENT PLACEMENT;  Surgeon: Hollice Espy, MD;  Location: [facility];  Service: Urology;  Laterality: Left;  . KNEE ARTHROSCOPY W/ PARTIAL MEDIAL MENISCECTOMY Right 2012   [place], 2012  . LEFT HEART CATH AND CORONARY ANGIOGRAPHY N/A 03/20/2019   Procedure: LEFT HEART CATH AND CORONARY ANGIOGRAPHY;  Surgeon: Troy Sine, MD;  Location: [facility];  Service: Cardiovascular;  Laterality: N/A;  . NASAL SINUS SURGERY    . neck fusion    . TONSILLECTOMY    . VASECTOMY       MEDICATIONS:  (Not in a hospital admission)   ALLERGIES:   Allergies  Allergen Reactions  . Other Nausea And Vomiting and Other (See Comments)    Karlton Lemon  eggs caused severe stomach pain and n/v    REVIEW OF SYSTEMS:  Pertinent items are noted in HPI.   FAMILY HISTORY:   Family History  Problem Relation Age of Onset  . Arthritis Father   . Coronary artery disease Father   . Hypertension Father   . CVA Father 83  . Colon cancer Mother   . Heart attack  Brother   . Heart attack Brother   . Coronary artery disease Brother   . Esophageal cancer Neg Hx   . Stomach cancer Neg Hx   . Rectal cancer Neg Hx     SOCIAL HISTORY:   Social History   Tobacco Use  . Smoking status: Current Every Day Smoker    Packs/day: 1.00    Years: 40.00    Pack years: 40.00    Types: Cigarettes  . Smokeless tobacco: Never Used  . Tobacco comment: he is aware he needs to quit   Substance Use Topics  . Alcohol use: No    Alcohol/week: 0.0 standard drinks     EXAMINATION:  Vital signs in last 24 hours: @VSRANGES @  General appearance: alert, cooperative and no distress Neck: no JVD and supple, symmetrical, trachea midline Lungs: clear to auscultation bilaterally Heart: regular rate and rhythm, S1, S2 normal, no murmur, click, rub or gallop Abdomen: soft, non-tender; bowel sounds normal; no masses,  no organomegaly Extremities: extremities normal, atraumatic, no cyanosis or edema and Homans sign is negative, no sign of DVT Pulses: 2+ and symmetric Skin: Skin color, texture, turgor normal. No rashes or lesions Neurologic: Alert and oriented X 3, normal strength and tone. Normal symmetric reflexes. Normal coordination and gait  Musculoskeletal:  ROM 0-120, Ligaments intact,  Imaging Review  MRI left knee shows left knee meniscal tear  Assessment/Plan:  left knee meniscal tear  The patient history, physical examination and imaging studies are consistent with left knee meniscal tear of the left knee. The patient has failed conservative treatment.  The clearance notes were reviewed.  After discussion with the patient it was felt that left knee arthroscopy was indicated. The procedure,  risks, and benefits were presented and reviewed. The patient acknowledged the explanation, agreed to proceed with the plan.  Carlynn Spry 11/07/2020, 9:36 AM

## 2020-11-08 ENCOUNTER — Other Ambulatory Visit: Payer: Self-pay

## 2020-11-08 ENCOUNTER — Other Ambulatory Visit
Admission: RE | Admit: 2020-11-08 | Discharge: 2020-11-08 | Disposition: A | Discharge status: Home / Self Care | Payer: 59 | Source: Ambulatory Visit | Attending: Orthopedic Surgery | Admitting: Orthopedic Surgery

## 2020-11-08 HISTORY — DX: Essential (primary) hypertension: I10

## 2020-11-08 HISTORY — DX: Personal history of urinary calculi: Z87.442

## 2020-11-08 NOTE — Patient Instructions (Addendum)
INSTRUCTIONS FOR SURGERY     Your surgery is scheduled for:   Wednesday, MARCH 16TH     To find out your arrival time for the day of surgery,          please call [phone] between 1 pm and 3 pm on :  Tuesday, MARCH 15TH     When you arrive for surgery, report to the [facility].      ONCE FINISHED THERE, GO TO THE SECOND FLOOR AND SIGN IN AT THE         SURGICAL DESK.    REMEMBER: Instructions that are not followed completely may result in serious medical risk,  up to and including death, or upon the discretion of your surgeon and anesthesiologist,            your surgery may need to be rescheduled.  __X__ 1. Do not eat food after midnight the night before your procedure.                    No gum, candy, lozenger, tic tacs, tums or hard candies.                  ABSOLUTELY NOTHING SOLID IN YOUR MOUTH AFTER MIDNIGHT                    You may drink unlimited clear liquids up to 2 hours before you are scheduled to arrive for surgery.                   Do not drink anything within those 2 hours unless you need to take medicine, then take the                   smallest amount you need.  Clear liquids include:  water, apple juice without pulp,                   any flavor Gatorade, Black coffee, black tea.  Sugar may be added but no dairy/ honey /lemon.                        Broth and jello is not considered a clear liquid.  __x__  2. On the morning of surgery, please brush your teeth with toothpaste and water. You may rinse with                  mouthwash if you wish but DO NOT SWALLOW TOOTHPASTE OR MOUTHWASH  __X___3. NO alcohol for 24 hours before or after surgery.  __x___ 4.  Do NOT smoke or use e-cigarettes for 24 HOURS PRIOR TO SURGERY.                      DO NOT Use any chewable tobacco products for at least 6 hours prior to surgery.  __x___ 5. If you start any new medication after this  appointment and prior to surgery, please                   Bring it with you on the day of  surgery.  ___x__ 6. Notify your doctor if there is any change in your medical condition, such as fever,                  infection, vomitting, diarrhea or any open sores.                   __x___ 7.  USE the CHG SOAP as instructed, the night before surgery and the day of surgery.                   Once you have washed with this soap, do NOT use any of the following: Powders, perfumes                    or lotions. Please do not wear make up, hairpins, clips or nail polish. You may wear deodorant.                   Men may shave their face and neck.                    DO NOT wear ANY jewelry on the day of surgery. If there are rings that are too tight to                    remove easily, please address this prior to the surgery day. Piercings need to be removed.                                                                     NO METAL ON YOUR BODY.                    Do NOT bring any valuables.  If you came to Pre-Admit testing then you will not need license,                     insurance card or credit card.  If you will be staying overnight, please either leave your things in                     the car or have your family be responsible for these items.                     [facility] IS NOT RESPONSIBLE FOR BELONGINGS OR VALUABLES.  ___X__ 8. DO NOT wear contact lenses on surgery day.  You may not have dentures,                     Hearing aides, contacts or glasses in the operating room. These items can be                    Placed in the Recovery Room to receive immediately after surgery.  __x___ 9. IF YOU ARE SCHEDULED TO GO HOME ON THE SAME DAY, YOU MUST                   Have someone to drive you home and to stay with you  for the first 24 hours.  Have an arrangement prior to arriving on surgery day.  ___x__ 10. Take the following medications on the morning of surgery with  a sip of water:                              1. DULERA INHALER                     2. ALBUTEROL INHALER                     3. AMLODIPINE                     4. LEXAPRO                     5.                         __X___  12. CONTINUE ASPIRIN BUT DO NOT TAKE ON THE AM OF SURGERY.                        STOP ALL OTHER ASPIRIN PRODUCTS TODAY.                        THIS INCLUDES BC POWDERS / GOODIES POWDER  __x___ 13. STOP Anti-inflammatories as of TODAY, MARCH 11TH                      This includes IBUPROFEN / MOTRIN / ADVIL / ALEVE/ NAPROXYN                    YOU MAY TAKE TYLENOL ANY TIME PRIOR TO SURGERY.  __X___ 43.  Stop supplements until after surgery.                     This includes: MULTIVITAMINS // FISH OIL // ZINC.  ____X__17.  Continue to take the following medications but do not take on the morning of surgery:                          ASPIRIN  ___X___18.  Wear clean and comfortable clothing to the hospital.      [place]. IF NOT AVAILABLE, PLEASE WEAR A   LOOSE OR STRETCHY PANT. BRING CELL PHONE NUMBER FOR YOUR WIFE. DON'T FORGET YOUR STOOL SOFTENERS ONCE HOME!!

## 2020-11-11 ENCOUNTER — Other Ambulatory Visit: Payer: Self-pay

## 2020-11-11 ENCOUNTER — Other Ambulatory Visit
Admission: RE | Admit: 2020-11-11 | Discharge: 2020-11-11 | Disposition: A | Discharge status: Home / Self Care | Payer: 59 | Source: Ambulatory Visit | Attending: Orthopedic Surgery | Admitting: Orthopedic Surgery

## 2020-11-11 DIAGNOSIS — Z01812 Encounter for preprocedural laboratory examination: Secondary | ICD-10-CM | POA: Insufficient documentation

## 2020-11-11 DIAGNOSIS — Z20822 Contact with and (suspected) exposure to covid-19: Secondary | ICD-10-CM | POA: Diagnosis not present

## 2020-11-11 LAB — SARS CORONAVIRUS 2 (TAT 6-24 HRS): SARS Coronavirus 2: NEGATIVE

## 2020-11-12 MED ORDER — CEFAZOLIN SODIUM-DEXTROSE 2-4 GM/100ML-% IV SOLN
2.0000 g | INTRAVENOUS | Status: AC
  Administered 2020-11-13: 2 g via INTRAVENOUS

## 2020-11-12 MED ORDER — LACTATED RINGERS IV SOLN: INTRAVENOUS | Status: DC

## 2020-11-12 MED ORDER — ORAL CARE MOUTH RINSE: 15.0000 mL | Freq: Once | OROMUCOSAL | Status: AC

## 2020-11-12 MED ORDER — FAMOTIDINE 20 MG PO TABS: 20.0000 mg | ORAL_TABLET | Freq: Once | ORAL | Status: AC

## 2020-11-12 MED ORDER — CHLORHEXIDINE GLUCONATE 0.12 % MT SOLN: 15.0000 mL | Freq: Once | OROMUCOSAL | Status: AC

## 2020-11-13 ENCOUNTER — Ambulatory Visit: Payer: 59 | Admitting: Certified Registered Nurse Anesthetist

## 2020-11-13 ENCOUNTER — Encounter
Admission: RE | Disposition: A | Discharge status: Home / Self Care | Payer: Self-pay | Source: Home / Self Care | Attending: Orthopedic Surgery

## 2020-11-13 ENCOUNTER — Encounter: Payer: Self-pay | Admitting: Orthopedic Surgery

## 2020-11-13 ENCOUNTER — Ambulatory Visit
Admission: RE | Admit: 2020-11-13 | Discharge: 2020-11-13 | Disposition: A | Discharge status: Home / Self Care | Payer: 59 | Attending: Orthopedic Surgery | Admitting: Orthopedic Surgery

## 2020-11-13 ENCOUNTER — Other Ambulatory Visit: Payer: Self-pay

## 2020-11-13 DIAGNOSIS — X58XXXA Exposure to other specified factors, initial encounter: Secondary | ICD-10-CM | POA: Diagnosis not present

## 2020-11-13 DIAGNOSIS — S83242A Other tear of medial meniscus, current injury, left knee, initial encounter: Secondary | ICD-10-CM | POA: Insufficient documentation

## 2020-11-13 DIAGNOSIS — Z8249 Family history of ischemic heart disease and other diseases of the circulatory system: Secondary | ICD-10-CM | POA: Insufficient documentation

## 2020-11-13 DIAGNOSIS — F17210 Nicotine dependence, cigarettes, uncomplicated: Secondary | ICD-10-CM | POA: Diagnosis not present

## 2020-11-13 DIAGNOSIS — Z8261 Family history of arthritis: Secondary | ICD-10-CM | POA: Insufficient documentation

## 2020-11-13 DIAGNOSIS — Z823 Family history of stroke: Secondary | ICD-10-CM | POA: Insufficient documentation

## 2020-11-13 HISTORY — PX: KNEE ARTHROSCOPY WITH MEDIAL MENISECTOMY: SHX5651

## 2020-11-13 LAB — POCT I-STAT, CHEM 8
BUN: 10 mg/dL (ref 6–20)
Calcium, Ion: 1.15 mmol/L (ref 1.15–1.40)
Chloride: 101 mmol/L (ref 98–111)
Creatinine, Ser: 0.9 mg/dL (ref 0.61–1.24)
Glucose, Bld: 99 mg/dL (ref 70–99)
HCT: 42 % (ref 39.0–52.0)
Hemoglobin: 14.3 g/dL (ref 13.0–17.0)
Potassium: 2.7 mmol/L — CL (ref 3.5–5.1)
Sodium: 145 mmol/L (ref 135–145)
TCO2: 31 mmol/L (ref 22–32)

## 2020-11-13 LAB — POTASSIUM: Potassium: 2.9 mmol/L — ABNORMAL LOW (ref 3.5–5.1)

## 2020-11-13 SURGERY — ARTHROSCOPY, KNEE, WITH MEDIAL MENISCECTOMY
Anesthesia: General | Site: Knee | Laterality: Left

## 2020-11-13 MED ORDER — PHENYLEPHRINE HCL (PRESSORS) 10 MG/ML IV SOLN
INTRAVENOUS | Status: DC | PRN
  Administered 2020-11-13: 100 ug via INTRAVENOUS

## 2020-11-13 MED ORDER — ACETAMINOPHEN 10 MG/ML IV SOLN
INTRAVENOUS | Status: DC | PRN
  Administered 2020-11-13: 1000 mg via INTRAVENOUS

## 2020-11-13 MED ORDER — POTASSIUM CHLORIDE CRYS ER 20 MEQ PO TBCR
EXTENDED_RELEASE_TABLET | ORAL | Status: AC
  Filled 2020-11-13: qty 1

## 2020-11-13 MED ORDER — POTASSIUM CHLORIDE CRYS ER 20 MEQ PO TBCR: 20.0000 meq | EXTENDED_RELEASE_TABLET | Freq: Once | ORAL | Status: DC

## 2020-11-13 MED ORDER — MIDAZOLAM HCL 2 MG/2ML IJ SOLN
INTRAMUSCULAR | Status: AC
  Filled 2020-11-13: qty 2

## 2020-11-13 MED ORDER — HYDROMORPHONE HCL 1 MG/ML IJ SOLN
INTRAMUSCULAR | Status: DC | PRN
  Administered 2020-11-13 (×2): .2 mg via INTRAVENOUS
  Administered 2020-11-13: .4 mg via INTRAVENOUS
  Administered 2020-11-13: .2 mg via INTRAVENOUS

## 2020-11-13 MED ORDER — IPRATROPIUM-ALBUTEROL 0.5-2.5 (3) MG/3ML IN SOLN
RESPIRATORY_TRACT | Status: AC
  Filled 2020-11-13: qty 3

## 2020-11-13 MED ORDER — OXYCODONE HCL 5 MG/5ML PO SOLN: 5.0000 mg | Freq: Once | ORAL | Status: DC | PRN

## 2020-11-13 MED ORDER — SEVOFLURANE IN SOLN
RESPIRATORY_TRACT | Status: AC
  Filled 2020-11-13: qty 250

## 2020-11-13 MED ORDER — FENTANYL CITRATE (PF) 100 MCG/2ML IJ SOLN
INTRAMUSCULAR | Status: DC | PRN
  Administered 2020-11-13: 100 ug via INTRAVENOUS

## 2020-11-13 MED ORDER — OXYCODONE HCL 5 MG PO TABS: 5.0000 mg | ORAL_TABLET | Freq: Once | ORAL | Status: DC | PRN

## 2020-11-13 MED ORDER — LIDOCAINE HCL (CARDIAC) PF 100 MG/5ML IV SOSY
PREFILLED_SYRINGE | INTRAVENOUS | Status: DC | PRN
  Administered 2020-11-13: 60 mg via INTRAVENOUS

## 2020-11-13 MED ORDER — ONDANSETRON HCL 4 MG/2ML IJ SOLN
INTRAMUSCULAR | Status: DC | PRN
  Administered 2020-11-13: 4 mg via INTRAVENOUS

## 2020-11-13 MED ORDER — HYDROMORPHONE HCL 1 MG/ML IJ SOLN
INTRAMUSCULAR | Status: AC
  Filled 2020-11-13: qty 1

## 2020-11-13 MED ORDER — DEXAMETHASONE SODIUM PHOSPHATE 10 MG/ML IJ SOLN
INTRAMUSCULAR | Status: DC | PRN
  Administered 2020-11-13: 10 mg via INTRAVENOUS

## 2020-11-13 MED ORDER — METHYLPREDNISOLONE ACETATE 40 MG/ML IJ SUSP
INTRAMUSCULAR | Status: AC
  Filled 2020-11-13: qty 1

## 2020-11-13 MED ORDER — KETOROLAC TROMETHAMINE 30 MG/ML IJ SOLN
INTRAMUSCULAR | Status: DC | PRN
  Administered 2020-11-13: 30 mg via INTRAVENOUS

## 2020-11-13 MED ORDER — IPRATROPIUM-ALBUTEROL 0.5-2.5 (3) MG/3ML IN SOLN
3.0000 mL | RESPIRATORY_TRACT | Status: DC
  Administered 2020-11-13: 3 mL via RESPIRATORY_TRACT

## 2020-11-13 MED ORDER — FENTANYL CITRATE (PF) 100 MCG/2ML IJ SOLN: 25.0000 ug | INTRAMUSCULAR | Status: DC | PRN

## 2020-11-13 MED ORDER — ONDANSETRON HCL 4 MG/2ML IJ SOLN: 4.0000 mg | Freq: Once | INTRAMUSCULAR | Status: DC | PRN

## 2020-11-13 MED ORDER — CEFAZOLIN SODIUM-DEXTROSE 2-4 GM/100ML-% IV SOLN
INTRAVENOUS | Status: AC
  Filled 2020-11-13: qty 100

## 2020-11-13 MED ORDER — BUPIVACAINE-EPINEPHRINE (PF) 0.25% -1:200000 IJ SOLN
INTRAMUSCULAR | Status: DC | PRN
  Administered 2020-11-13: 12 mL via PERINEURAL

## 2020-11-13 MED ORDER — FAMOTIDINE 20 MG PO TABS
ORAL_TABLET | ORAL | Status: AC
  Administered 2020-11-13: 20 mg via ORAL
  Filled 2020-11-13: qty 1

## 2020-11-13 MED ORDER — POTASSIUM CHLORIDE 10 MEQ/100ML IV SOLN
10.0000 meq | INTRAVENOUS | Status: AC
  Administered 2020-11-13: 10 meq via INTRAVENOUS
  Filled 2020-11-13: qty 100

## 2020-11-13 MED ORDER — MIDAZOLAM HCL 2 MG/2ML IJ SOLN
INTRAMUSCULAR | Status: DC | PRN
  Administered 2020-11-13: 2 mg via INTRAVENOUS

## 2020-11-13 MED ORDER — PROPOFOL 10 MG/ML IV BOLUS
INTRAVENOUS | Status: DC | PRN
  Administered 2020-11-13: 150 mg via INTRAVENOUS

## 2020-11-13 MED ORDER — LACTATED RINGERS IV SOLN
INTRAVENOUS | Status: DC | PRN
  Administered 2020-11-13: 6000 mL

## 2020-11-13 MED ORDER — EPHEDRINE SULFATE 50 MG/ML IJ SOLN
INTRAMUSCULAR | Status: DC | PRN
  Administered 2020-11-13: 5 mg via INTRAVENOUS

## 2020-11-13 MED ORDER — EPINEPHRINE PF 1 MG/ML IJ SOLN
INTRAMUSCULAR | Status: AC
  Filled 2020-11-13: qty 4

## 2020-11-13 MED ORDER — FENTANYL CITRATE (PF) 100 MCG/2ML IJ SOLN
INTRAMUSCULAR | Status: AC
  Filled 2020-11-13: qty 2

## 2020-11-13 MED ORDER — CHLORHEXIDINE GLUCONATE 0.12 % MT SOLN
OROMUCOSAL | Status: AC
  Administered 2020-11-13: 15 mL via OROMUCOSAL
  Filled 2020-11-13: qty 15

## 2020-11-13 SURGICAL SUPPLY — 39 items
ADAPTER IRRIG TUBE 2 SPIKE SOL (ADAPTER) ×4 IMPLANT
ADPR TBG 2 SPK PMP STRL ASCP (ADAPTER) ×2
APL PRP STRL LF DISP 70% ISPRP (MISCELLANEOUS) ×1
BLADE FULL RADIUS 3.5 (BLADE) IMPLANT
BLADE INCISOR PLUS 4.5 (BLADE) ×2 IMPLANT
BLADE SHAVER 4.5 DBL SERAT CV (CUTTER) ×1 IMPLANT
BLADE SURG SZ11 CARB STEEL (BLADE) ×2 IMPLANT
BRUSH SCRUB EZ  4% CHG (MISCELLANEOUS) ×4
BRUSH SCRUB EZ 4% CHG (MISCELLANEOUS) ×2 IMPLANT
CHLORAPREP W/TINT 26 (MISCELLANEOUS) ×2 IMPLANT
COOLER POLAR GLACIER W/PUMP (MISCELLANEOUS) ×2 IMPLANT
COVER WAND RF STERILE (DRAPES) ×2 IMPLANT
DRAPE ORTHO SPLIT 77X108 STRL (DRAPES) ×2
DRAPE SURG ORHT 6 SPLT 77X108 (DRAPES) ×1 IMPLANT
GAUZE SPONGE 4X4 12PLY STRL (GAUZE/BANDAGES/DRESSINGS) ×2 IMPLANT
GAUZE XEROFORM 1X8 LF (GAUZE/BANDAGES/DRESSINGS) ×2 IMPLANT
GLOVE INDICATOR 8.0 STRL GRN (GLOVE) ×2 IMPLANT
GLOVE SURG ORTHO LTX SZ8 (GLOVE) ×2 IMPLANT
GOWN STRL REUS W/ TWL LRG LVL3 (GOWN DISPOSABLE) ×1 IMPLANT
GOWN STRL REUS W/ TWL XL LVL3 (GOWN DISPOSABLE) ×1 IMPLANT
GOWN STRL REUS W/TWL LRG LVL3 (GOWN DISPOSABLE) ×2
GOWN STRL REUS W/TWL XL LVL3 (GOWN DISPOSABLE) ×2
IV LACTATED RINGER IRRG 3000ML (IV SOLUTION) ×8
IV LR IRRIG 3000ML ARTHROMATIC (IV SOLUTION) ×4 IMPLANT
KIT TURNOVER KIT A (KITS) ×2 IMPLANT
MANIFOLD NEPTUNE II (INSTRUMENTS) ×4 IMPLANT
MAT ABSORB  FLUID 56X50 GRAY (MISCELLANEOUS) ×2
MAT ABSORB FLUID 56X50 GRAY (MISCELLANEOUS) ×1 IMPLANT
NDL SPNL 20GX3.5 QUINCKE YW (NEEDLE) ×1 IMPLANT
NEEDLE SPNL 20GX3.5 QUINCKE YW (NEEDLE) ×2 IMPLANT
PACK ARTHROSCOPY KNEE (MISCELLANEOUS) ×2 IMPLANT
PAD ABD DERMACEA PRESS 5X9 (GAUZE/BANDAGES/DRESSINGS) ×4 IMPLANT
PAD WRAPON POLAR KNEE (MISCELLANEOUS) ×1 IMPLANT
SUT ETHILON 4-0 (SUTURE) ×2
SUT ETHILON 4-0 FS2 18XMFL BLK (SUTURE) ×1
SUTURE ETHLN 4-0 FS2 18XMF BLK (SUTURE) ×1 IMPLANT
TUBING ARTHRO INFLOW-ONLY STRL (TUBING) ×2 IMPLANT
WAND WEREWOLF FLOW 90D (MISCELLANEOUS) ×2 IMPLANT
WRAPON POLAR PAD KNEE (MISCELLANEOUS) ×2

## 2020-11-13 NOTE — Progress Notes (Signed)
Patient is discharging home in stable condition. Discharge instructions given to patient and wife, all questions answered appropriately. Patient 02 levels remaining at 84% with no complications and no complaints of pain. Excited about going home.

## 2020-11-13 NOTE — Transfer of Care (Signed)
Immediate Anesthesia Transfer of Care Note  Patient: Isaiah Rangel  Procedure(s) Performed: Left knee arthroscopy with partial medial or lateral menisectomy, possible chondroplasty, possible partial synovectomy (Left Knee)  Patient Location: PACU  Anesthesia Type:General  Level of Consciousness: sedated  Airway & Oxygen Therapy: Patient connected to face mask oxygen  Post-op Assessment: Post -op Vital signs reviewed and stable  Post vital signs: stable  Last Vitals:  Vitals Value Taken Time  BP 125/74 11/13/20 1508  Temp    Pulse 89 11/13/20 1512  Resp 14 11/13/20 1512  SpO2 100 % 11/13/20 1512  Vitals shown include unvalidated device data.  Last Pain:  Vitals:   11/13/20 1345  TempSrc: Tympanic  PainSc: 0-No pain      Patients Stated Pain Goal: 0 (88/75/79 [id number])  Complications: No complications documented.

## 2020-11-13 NOTE — Progress Notes (Signed)
Patient SPO2 remaining in mid 80's-89 and getting to 90-91% but desatting down. Patient is asymptomatic and says that he does not wear 02 at home, and occasionally uses breathing treatments. Incentive spirometer given and educated. Up to recliner with assist. Notified dr piscetello, and breathing treatment ordered.

## 2020-11-13 NOTE — Op Note (Signed)
  PATIENT:  Isaiah Rangel  PRE-OPERATIVE DIAGNOSIS:  S83.207A Unsp tear of unsp meniscus, current injury, left knee, init  POST-OPERATIVE DIAGNOSIS:  Same  PROCEDURE:  Left knee arthroscopy with partial medial menisectomy and partial synovectomy  SURGEON:  Kurtis Bushman, MD  ANESTHESIA:   General  PREOPERATIVE INDICATIONS:  Isaiah Rangel  60 y.o. male with a diagnosis of S83.207A Unsp tear of unsp meniscus, current injury, left knee, init who failed conservative management and elected for surgical management.    The risks benefits and alternatives were discussed with the patient preoperatively including the risks of infection, bleeding, nerve injury, knee stiffness, persistent pain, osteoarthritis and the need for further surgery. Medical  risks include DVT and pulmonary embolism, myocardial infarction, stroke, pneumonia, respiratory failure and death. The patient understood these risks and wished to proceed.   OPERATIVE FINDINGS: The suprapatellar pouch, medial and lateral gutters were inspected and found to be free of any loose bodies. The undersurface of the patella and landing zone were inspected and grade 1 chondromalacia was noted. There was no evidence of lateral subluxation. The medial compartment showed a large bucket handle tear from anterior to posterior. Grade 1 chondromalacia was identified in the medial compartment.  The notch was inspected and the ACL and PCL were intact and stable to probing. The lateral compartment was entered and minimal degenerative changes were identified. The lateral meniscus had minimal degenerative tearing along the posterior horn. The anterior horn was intact a stable.  OPERATIVE PROCEDURE: Patient was met in the preoperative area. The operative extremity was signed with my initials according the hospital's correct site of surgery protocol.  The patient was brought to the operating room where they was placed supine on the operative table. General  anesthesia was administered. The patient was prepped and draped in a sterile fashion.  A timeout was performed to verify the patient's name, date of birth, medical record number, correct site of surgery correct procedure to be performed. It was also used to verify the patient received antibiotics that all appropriate instruments, and radiographic studies were available in the room. Once all in attendance were in agreement, the case began.  Proposed arthroscopy incisions were drawn out with a surgical marker. These were pre-injected with 0.5% marcaine with epinephrine. An 11 blade was used to establish an inferior lateral and inferomedial portals. The inferomedial portal was created using a 18-gauge spinal needle under direct visualization.  A full diagnostic examination of the knee was performed, please see findings for a complete list of results.  Patient had the meniscal tear treated with a 4-0 resector shaver blade and straight duckbill basket. The meniscus was debrided until a stable rim was achieved. A partial synovectomy was also performed in all three compartments using a 4-0 resector shaver blade and electrocautery.  The knee was then copiously lavaged. All arthroscopic instruments were removed. The 2 arthroscopy portals were closed with 4-0 nylon. A dry sterile and compressive dressing was applied. The patient was brought to the PACU in stable condition. I was scrubbed and present for the entire case and all sharp and instrument counts were correct at the conclusion the case. I spoke with the patient's family postoperatively to let them know the case was performed without complication and the patient was stable in the recovery room.  Kurtis Bushman, MD

## 2020-11-13 NOTE — Anesthesia Procedure Notes (Signed)
Procedure Name: LMA Insertion Date/Time: 11/13/2020 2:00 PM Performed by: Willette Alma, CRNA Pre-anesthesia Checklist: Patient identified, Patient being monitored, Timeout performed, Emergency Drugs available and Suction available Patient Re-evaluated:Patient Re-evaluated prior to induction Oxygen Delivery Method: Circle system utilized Preoxygenation: Pre-oxygenation with 100% oxygen Induction Type: IV induction Ventilation: Mask ventilation without difficulty LMA: LMA inserted LMA Size: 4.0 Tube type: Oral Number of attempts: 1 Placement Confirmation: positive ETCO2 and breath sounds checked- equal and bilateral Tube secured with: Tape Dental Injury: Teeth and Oropharynx as per pre-operative assessment

## 2020-11-13 NOTE — Discharge Instructions (Signed)
Outpatient Surgery, Adult, Care After This sheet gives you information about how to care for yourself after your procedure. Your health care provider may also give you more specific instructions. If you have problems or questions, contact your health care provider. What can I expect after the procedure? After the procedure, it is common to have:  Tenderness and numbness at the surgical site.  Swelling, bruising, and numbness around the surgical site.  Nausea. Follow these instructions at home: For the time period you were told by your health care provider:  Rest.  Do not participate in activities where you could fall or become injured.  Do not drive or use machinery.  Do not drink alcohol.  Do not take sleeping pills or medicines that cause drowsiness.  Do not make important decisions or sign legal documents.  Do not take care of children on your own. Medicines  Take over-the-counter and prescription medicines only as told by your health care provider.  If you were prescribed an antibiotic medicine, take it as told by your health care provider. Do not stop taking the antibiotic even if you start to feel better.  Ask your health care provider if the medicine prescribed to you: ? Requires you to avoid driving or using machinery. ? Can cause constipation. You may need to take these actions to prevent or treat constipation:  Drink enough fluid to keep your urine pale yellow.  Take over-the-counter or prescription medicines.  Eat foods that are high in fiber, such as beans, whole grains, and fresh fruits and vegetables.  Limit foods that are high in fat and processed sugars, such as fried or sweet foods. Eating and drinking  Follow the diet recommended by your health care provider.  When you are hungry, begin eating light and bland foods, such as toast. Gradually return to your regular diet.  If you vomit: ? Drink clear fluids slowly and in small amounts as you are able.  Clear fluids include water, ice chips, low-calorie sports drinks, and fruit juice that has water added (diluted fruit juice). ? Eat bland, easy-to-digest foods in small amounts as you are able. These foods include bananas, applesauce, rice, lean meats, toast, and crackers. Incision care  Follow instructions from your health care provider about how to take care of an incision, if you have one. Make sure you: ? Wash your hands with soap and water for at least 20 seconds before and after you change your bandage (dressing). If soap and water are not available, use hand sanitizer. ? Change your dressing as told by your health care provider. ? Leave stitches (sutures), skin glue, or adhesive strips in place. These skin closures may need to stay in place for 2 weeks or longer. If adhesive strip edges start to loosen and curl up, you may trim the loose edges. Do not remove adhesive strips completely unless your health care provider tells you to do that.  Check your incision area every day for signs of infection. Check for: ? Redness, swelling, or pain. ? Fluid or blood. ? Warmth. ? Pus or a bad smell.   Activity  Do not play contact sports until your health care provider says it is okay.  Follow instructions from your health care provider about lifting heavy objects. You may be told not to lift things that weigh more than a certain amount.  Return to your normal activities as told by your health care provider. Ask your health care provider what activities are safe for you.  General instructions  If you have sleep apnea, surgery and certain medicines can increase your risk for breathing problems. Follow instructions from your health care provider about wearing your sleep device: ? Anytime you are sleeping, including during daytime naps. ? While taking prescription pain medicines, sleep medicines, or medicines that make you drowsy.  Have a responsible adult stay with you for the time you are told.  It is important to have someone help care for you until you are awake and alert.  Do not use any products that contain nicotine or tobacco, such as cigarettes, e-cigarettes, and chewing tobacco. These can delay healing after surgery. If you need help quitting, ask your health care provider.  Ask your health care provider when you can take baths or showers, swim, or use a hot tub. You may only be allowed to take sponge baths.  Keep all follow-up visits as told by your health care provider. This is important. Contact a health care provider if:  You have any of these signs of infection: ? Redness, swelling, or pain around your incision or IV site. ? Fluid or blood coming from your incision. ? Warmth coming from your incision. ? Pus or a bad smell coming from your incision. ? A fever.  You feel light-headed or you faint.  You develop a rash.  You keep feeling nauseous or keep vomiting.  You have severe pain, even after taking the medicines your health care provider has prescribed or recommended.  You have constipation. Get help right away if:  You cannot urinate.  You have trouble breathing.  You have chest pain.  Your legs become painful or swollen. These symptoms may represent a serious problem that is an emergency. Do not wait to see if the symptoms will go away. Get medical help right away. Call your local emergency services (911 in the [place] Do not drive yourself to the hospital. Summary  Nausea is common after a procedure.  Have a responsible adult stay with you for the time you are told. It is important to have someone help care for you until you are awake and alert.  Follow the diet recommended by your health care provider. If you vomit, drink clear fluids slowly and eat bland, easy-to-digest foods in small amounts.  Ask your health care provider what activities are safe for you. This information is not intended to replace advice given to you by your health care  provider. Make sure you discuss any questions you have with your health care provider. Document Revised: 12/15/2019 Document Reviewed: 06/08/2019 Elsevier Patient Education  2021 [place] Instructions for Knee Arthroscopy  PATIENT ALREADY HAS FOLLOW-UP AND PHYSICAL THERAPY SCHEDULED  1) Do not sit for longer than 1 hour at a time with your leg dangling down.  You should have your legs elevated (higher than your heart) in a recliner chair or couch.  2) You may be up walking around as tolerated but should take periodic breaks to elevate your legs.  Discontinue use of crutches when you feel you are able to walk without pain or a limp.  3) Work on gentle bending and straightening of the knee.  4) You may remove the Ace wrap and dressings two days after surgery.  Place band aids over the incision sites.  5) You may shower after you remove the surgical dressing.  You do not need to cover the incision with plastic wrap.  The incision can get wet, but do not submerge under water.  After your sutures have been removed, you should wait 24 hours before submerging incision under water.  6) Pain medication can cause constipation.  You should increase your fluid intake, increase your intake of high fiber foods and/or take Metamucil as needed for constipation.  7) Continue your physical therapy exercises, as shown at the office, at least twice daily.  You should set up outpatient physical therapy and start within the first week after surgery.  8) Continue to use your Polar Pack continuously for 2-3 days after surgery.  After you remove the surgical dressing, it is a good idea to use your Polar Pack or ice pack for 30 minutes after doing your exercises to reduce swelling.  9) Do not be surprised if you have increased pain at night.  This usually means you have been a little too active during the day and need to reduce your activities.  10) If you develop lower extremity swelling that does  not improve after a night of elevation, please call the office.  This could be an early sign of a blood clot.  Please call with any questions at [phone]

## 2020-11-13 NOTE — H&P (Signed)
The patient has been re-examined, and the chart reviewed, and there have been no interval changes to the documented history and physical.  Plan a left knee scope today.  His potassium is low on arrival and will re-check in pre-op.  Anesthesia is not consulted regarding a peripheral nerve block for post-operative pain.  The risks, benefits, and alternatives have been discussed at length, and the patient is willing to proceed.

## 2020-11-13 NOTE — Anesthesia Preprocedure Evaluation (Addendum)
Anesthesia Evaluation  Patient identified by MRN, date of birth, ID band Patient awake    Reviewed: Allergy & Precautions, H&P , NPO status , Patient's Chart, lab work & pertinent test results  History of Anesthesia Complications Negative for: history of anesthetic complications  Airway Mallampati: II  TM Distance: >3 FB     Dental  (+) Missing   Pulmonary neg sleep apnea, COPD,  COPD inhaler, Current SmokerPatient did not abstain from smoking.,     + decreased breath sounds      Cardiovascular (-) angina+ CAD, + Past MI and + DOE  (-) Cardiac Stents (-) dysrhythmias  Rhythm:regular Rate:Normal  H/o coronary 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, takes Imdur   Neuro/Psych PSYCHIATRIC DISORDERS Anxiety Depression negative neurological ROS     GI/Hepatic Neg liver ROS, GERD  Controlled,  Endo/Other  negative endocrine ROS  Renal/GU      Musculoskeletal   Abdominal   Peds  Hematology negative hematology ROS (+)   Anesthesia Other Findings Chronic hypokalemia  Past Medical History: No date: Allergy No date: Anxiety No date: BPH (benign prostatic hyperplasia) No date: Cancer [facility])     Comment:  basal and squamous No date: COPD (chronic obstructive pulmonary disease) (HCC) No date: Coronary vasospasm ([place])     Comment:  a. 03/2019 Cath: RCA 15m but only 40% after intracoronary              ntg-->Imdur started. No date: GERD (gastroesophageal reflux disease) No date: History of echocardiogram     Comment:  a. 03/2019 Echo: EF 50-55%, nl RV size/fxn. Trace MR/TR. No date: History of kidney stones No date: Hyperlipidemia 03/2019: Myocardial infarction [facility])     Comment:  NO STENTS No date: Non-obstructive CAD (coronary artery disease)     Comment:  a. 03/2013 Cath: min irregs. EF >55%; b. 03/2019 Cath: LM               nl, LAD nl, LCX nl, OM1 40, RCA 30p, 61m-->40% after IC               NTG. 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 Medical management and smoking cessation               recommended 08/28/2020: CATARACT EXTRACTION W/PHACO; Right     Comment:  Procedure: CATARACT EXTRACTION PHACO AND INTRAOCULAR               LENS PLACEMENT ([place]) RIGHT Tyler Deis;  Surgeon:               Leandrew Koyanagi, MD;  Location: [facility];              Service: Ophthalmology;  Laterality: Right;                1.77 0:31.9 5.6% 06/13/2020: CYSTOSCOPY/URETEROSCOPY/HOLMIUM LASER/STENT PLACEMENT;  Left     Comment:  Procedure: CYSTOSCOPY/URETEROSCOPY//STENT PLACEMENT;                Surgeon: Hollice Espy, MD;  Location: [facility] ORS;                Service: Urology;  Laterality: Left; 06/24/2020: CYSTOSCOPY/URETEROSCOPY/HOLMIUM LASER/STENT PLACEMENT;  Left     Comment:  Procedure: CYSTOSCOPY/URETEROSCOPY/HOLMIUM LASER/STENT               PLACEMENT;  Surgeon: Hollice Espy, MD;  Location: [facility]              ORS;  Service: Urology;  Laterality: Left; 2012: KNEE ARTHROSCOPY W/ PARTIAL MEDIAL MENISCECTOMY; Right     Comment:  Noemi Chapel, 2012 03/20/2019: LEFT HEART CATH AND CORONARY ANGIOGRAPHY; N/A     Comment:  Procedure: LEFT HEART CATH AND CORONARY ANGIOGRAPHY;                Surgeon: Troy Sine, MD;  Location: [facility]               LAB;  Service: Cardiovascular;  Laterality: N/A; No date: NASAL SINUS SURGERY 2000: neck fusion No date: TONSILLECTOMY No date: VASECTOMY     Reproductive/Obstetrics negative OB ROS                            Anesthesia Physical Anesthesia Plan  ASA: III  Anesthesia Plan: General LMA   Post-op Pain Management:    Induction:   PONV Risk Score and Plan: Dexamethasone,  Ondansetron, Midazolam and Treatment may vary due to age or medical condition  Airway Management Planned:   Additional Equipment:   Intra-op Plan:   Post-operative Plan:   Informed Consent: I have reviewed the patients History and Physical, chart, labs and discussed the procedure including the risks, benefits and alternatives for the proposed anesthesia with the patient or authorized representative who has indicated his/her understanding and acceptance.     Dental Advisory Given  Plan Discussed with: Anesthesiologist, CRNA and Surgeon  Anesthesia Plan Comments:         Anesthesia Quick Evaluation

## 2020-11-14 ENCOUNTER — Encounter: Payer: Self-pay | Admitting: Orthopedic Surgery

## 2020-11-14 NOTE — Anesthesia Postprocedure Evaluation (Signed)
Anesthesia Post Note  Patient: Isaiah Rangel  Procedure(s) Performed: Left knee arthroscopy with partial medial or lateral menisectomy, possible chondroplasty, possible partial synovectomy (Left Knee)  Patient location during evaluation: PACU Anesthesia Type: General Level of consciousness: awake and alert Pain management: pain level controlled Vital Signs Assessment: post-procedure vital signs reviewed and stable Respiratory status: spontaneous breathing, nonlabored ventilation and respiratory function stable Cardiovascular status: blood pressure returned to baseline and stable Postop Assessment: no apparent nausea or vomiting Anesthetic complications: no   No complications documented.   Last Vitals:  Vitals:   11/13/20 1615 11/13/20 1656  BP:  136/82  Pulse: 76 78  Resp: 14 15  Temp: 36.5 C 36.6 C  SpO2: 95% 96%    Last Pain:  Vitals:   11/14/20 0833  TempSrc:   PainSc: [place]

## 2020-12-30 ENCOUNTER — Other Ambulatory Visit: Payer: Self-pay

## 2020-12-30 ENCOUNTER — Ambulatory Visit
Admission: RE | Admit: 2020-12-30 | Discharge: 2020-12-30 | Disposition: A | Discharge status: Home / Self Care | Payer: 59 | Source: Ambulatory Visit | Attending: Urology | Admitting: Urology

## 2020-12-30 ENCOUNTER — Encounter: Payer: Self-pay | Admitting: Urology

## 2020-12-30 ENCOUNTER — Other Ambulatory Visit
Admission: RE | Admit: 2020-12-30 | Discharge: 2020-12-30 | Disposition: A | Discharge status: Home / Self Care | Payer: 59 | Source: Home / Self Care | Attending: Urology | Admitting: Urology

## 2020-12-30 ENCOUNTER — Ambulatory Visit
Admission: RE | Admit: 2020-12-30 | Discharge: 2020-12-30 | Disposition: A | Discharge status: Home / Self Care | Payer: 59 | Attending: Urology | Admitting: Urology

## 2020-12-30 ENCOUNTER — Other Ambulatory Visit: Payer: Self-pay | Admitting: *Deleted

## 2020-12-30 ENCOUNTER — Ambulatory Visit (INDEPENDENT_AMBULATORY_CARE_PROVIDER_SITE_OTHER): Payer: 59 | Admitting: Urology

## 2020-12-30 VITALS — BP 126/75 | HR 62 | Ht 65.0 in | Wt 131.0 lb

## 2020-12-30 DIAGNOSIS — N200 Calculus of kidney: Secondary | ICD-10-CM

## 2020-12-30 DIAGNOSIS — R109 Unspecified abdominal pain: Secondary | ICD-10-CM

## 2020-12-30 DIAGNOSIS — R3129 Other microscopic hematuria: Secondary | ICD-10-CM

## 2020-12-30 LAB — URINALYSIS, COMPLETE (UACMP) WITH MICROSCOPIC
Glucose, UA: NEGATIVE mg/dL
Leukocytes,Ua: NEGATIVE
Nitrite: NEGATIVE
Protein, ur: 30 mg/dL — AB
RBC / HPF: >50 RBC/hpf (ref 0–5)
Specific Gravity, Urine: 1.02 (ref 1.005–1.030)
pH: 5.5 (ref 5.0–8.0)

## 2020-12-30 MED ORDER — HYDROCODONE-ACETAMINOPHEN 5-325 MG PO TABS: 1.0000 | ORAL_TABLET | Freq: Four times a day (QID) | ORAL | 0 refills | Status: DC | PRN

## 2020-12-30 MED ORDER — SULFAMETHOXAZOLE-TRIMETHOPRIM 800-160 MG PO TABS: 1.0000 | ORAL_TABLET | Freq: Two times a day (BID) | ORAL | 0 refills | Status: DC

## 2020-12-30 MED ORDER — TAMSULOSIN HCL 0.4 MG PO CAPS: 0.4000 mg | ORAL_CAPSULE | Freq: Every day | ORAL | 0 refills | Status: DC

## 2020-12-30 NOTE — Progress Notes (Signed)
12/30/2020 3:52 PM   Isaiah Rangel 04/28/1961 [id number]  Referring provider: Owens Loffler, MD [address] [place],  [place] [address]  Chief Complaint  Patient presents with  . Follow-up    Possible stone?   Urological history: 1. Nephrolithiasis -most recent episode in 05/2020 -underwent staged URS in 05/2020 for left sided stone   HPI: Isaiah Rangel is a 60 y.o. male who presents today for an urgent appointment for symptoms of possible renal colic.   He started to experience lower back pain over the weekend, but this morning he localized to the right flank and was very intense.  He states that is reminiscent of the previous stones he has had in the past.  He did not have any gross hematuria until he gave his urine specimen for [place] today.  He took half a Vicodin this morning that he had on hand from his knee surgery and he states he is currently pain-free.  Patient denies any modifying or aggravating factors.  Patient denies any gross hematuria, dysuria or suprapubic/flank pain.  Patient denies any fevers, chills, nausea or vomiting.   UA 6-10 WBC's, > 50 RBC's and few bacteria.  KUB no stones visible.    CT renal stone study no stones seen, no hydro.   PMH: Past Medical History:  Diagnosis Date  . Allergy   . Anxiety   . BPH (benign prostatic hyperplasia)   . Cancer (HCC)    basal and squamous  . COPD (chronic obstructive pulmonary disease) ([place] [facility])   . Coronary vasospasm ([place])    a. 03/2019 Cath: RCA 53m but only 40% after intracoronary ntg-->Imdur started.  Marland Kitchen GERD (gastroesophageal reflux disease)   . History of echocardiogram    a. 03/2019 Echo: EF 50-55%, nl RV size/fxn. Trace MR/TR.  Marland Kitchen History of kidney stones   . Hyperlipidemia   . Myocardial infarction ([place]) 03/2019   NO STENTS  . Non-obstructive CAD (coronary artery disease)    a. 03/2013 Cath: min irregs. EF >55%; b. 03/2019 Cath: LM nl, LAD nl, LCX nl, OM1 40, RCA 30p, 15m-->40% after IC  NTG.  . OA (osteoarthritis)   . Orthostatic hypertension   . Tobacco abuse     Surgical History: Past Surgical History:  Procedure Laterality Date  . BACK SURGERY  1998   NO METAL  . CARDIAC CATHETERIZATION  04/06/13   [facility]- minor luminal irregularities, otherwise normal cors; EF > 55%. Medical management and smoking cessation recommended  . CATARACT EXTRACTION W/PHACO Right 08/28/2020   Procedure: CATARACT EXTRACTION PHACO AND INTRAOCULAR LENS PLACEMENT ([place]) RIGHT Tyler Deis;  Surgeon: Leandrew Koyanagi, MD;  Location: [facility];  Service: Ophthalmology;  Laterality: Right;  1.77 0:31.9 5.6%  . CYSTOSCOPY/URETEROSCOPY/HOLMIUM LASER/STENT PLACEMENT Left 06/13/2020   Procedure: CYSTOSCOPY/URETEROSCOPY//STENT PLACEMENT;  Surgeon: Hollice Espy, MD;  Location: [facility];  Service: Urology;  Laterality: Left;  . CYSTOSCOPY/URETEROSCOPY/HOLMIUM LASER/STENT PLACEMENT Left 06/24/2020   Procedure: CYSTOSCOPY/URETEROSCOPY/HOLMIUM LASER/STENT PLACEMENT;  Surgeon: Hollice Espy, MD;  Location: [facility];  Service: Urology;  Laterality: Left;  . KNEE ARTHROSCOPY W/ PARTIAL MEDIAL MENISCECTOMY Right 2012   [place], 2012  . KNEE ARTHROSCOPY WITH MEDIAL MENISECTOMY Left 11/13/2020   Procedure: Left knee arthroscopy with partial medial or lateral menisectomy, possible chondroplasty, possible partial synovectomy;  Surgeon: Lovell Sheehan, MD;  Location: [facility];  Service: Orthopedics;  Laterality: Left;  . LEFT HEART CATH AND CORONARY ANGIOGRAPHY N/A 03/20/2019   Procedure: LEFT HEART CATH AND CORONARY ANGIOGRAPHY;  Surgeon: Troy Sine, MD;  Location: [facility];  Service: Cardiovascular;  Laterality: N/A;  . NASAL SINUS SURGERY    . neck fusion  2000  . TONSILLECTOMY    . VASECTOMY      Home Medications:  Allergies as of 12/30/2020      Reactions   Other Nausea And Vomiting, Other (See Comments)   Quail eggs caused severe stomach pain and n/v      Medication List        Accurate as of Dec 30, 2020  3:52 PM. If you have any questions, ask your nurse or doctor.        acetaminophen 500 MG tablet Commonly known as: TYLENOL Take 500-1,000 mg by mouth every 6 (six) hours as needed for moderate pain.   albuterol 108 (90 Base) MCG/ACT inhaler Commonly known as: VENTOLIN HFA Inhale 2 puffs into the lungs every 6 (six) hours as needed for wheezing or shortness of breath.   amLODipine 2.5 MG tablet Commonly known as: NORVASC TAKE 1 TABLET BY MOUTH  DAILY   aspirin EC 81 MG tablet Take 81 mg by mouth daily.   Dulera 200-5 MCG/ACT Aero Generic drug: mometasone-formoterol Inhale 2 puffs into the lungs 2 (two) times daily.   escitalopram 10 MG tablet Commonly known as: LEXAPRO TAKE 1 TABLET BY MOUTH  DAILY What changed:   how much to take  how to take this  when to take this  additional instructions   ezetimibe 10 MG tablet Commonly known as: ZETIA TAKE 1 TABLET BY MOUTH  DAILY   Fish Oil 1200 MG Caps Take 1,200 mg by mouth daily.   fludrocortisone 0.1 MG tablet Commonly known as: FLORINEF TAKE 2 TABLETS BY MOUTH  DAILY   HYDROcodone-acetaminophen 5-325 MG tablet Commonly known as: NORCO/VICODIN Take 1 tablet by mouth every 6 (six) hours as needed for moderate pain.   ibuprofen 200 MG tablet Commonly known as: ADVIL Take 200-800 mg by mouth every 6 (six) hours as needed for moderate pain.   isosorbide mononitrate 30 MG 24 hr tablet Commonly known as: IMDUR TAKE 1 TABLET BY MOUTH  DAILY   methocarbamol 750 MG tablet Commonly known as: ROBAXIN Take 750 mg by mouth every 8 (eight) hours as needed for muscle spasms.   multivitamin with minerals Tabs tablet Take 1 tablet by mouth daily.   nitroGLYCERIN 0.4 MG SL tablet Commonly known as: NITROSTAT DISSOLVE 1 TABLET UNDER THE TONGUE EVERY 5 MINUTES AS NEEDED FOR CHEST PAIN   sulfamethoxazole-trimethoprim 800-160 MG tablet Commonly known as: BACTRIM DS Take 1 tablet by mouth  every 12 (twelve) hours. Started by: Zara Council, PA-C   tamsulosin 0.4 MG Caps capsule Commonly known as: FLOMAX Take 1 capsule (0.4 mg total) by mouth daily. Started by: Zara Council, PA-C   ZINC-VITAMIN C PO Take 1 tablet by mouth daily.       Allergies:  Allergies  Allergen Reactions  . Other Nausea And Vomiting and Other (See Comments)    Quail eggs caused severe stomach pain and n/v    Family History: Family History  Problem Relation Age of Onset  . Arthritis Father   . Coronary artery disease Father   . Hypertension Father   . CVA Father 63  . Colon cancer Mother   . Heart attack Brother   . Heart attack Brother   . Coronary artery disease Brother   . Esophageal cancer Neg Hx   . Stomach cancer Neg Hx   .  Rectal cancer Neg Hx     Social History:  reports that he has been smoking cigarettes. He has a 40.00 pack-year smoking history. He has never used smokeless tobacco. He reports that he does not drink alcohol and does not use drugs.  ROS: Pertinent ROS in HPI  Physical Exam: BP 126/75   Pulse 62   Ht 5\' 5"  (1.651 m)   Wt 131 lb (59.4 kg)   BMI 21.80 kg/m   Constitutional:  Well nourished. Alert and oriented, No acute distress. HEENT: [place] AT, mask in place  Trachea midline Cardiovascular: No clubbing, cyanosis, or edema. Respiratory: Normal respiratory effort, no increased work of breathing. Neurologic: Grossly intact, no focal deficits, moving all 4 extremities. Psychiatric: Normal mood and affect.  Laboratory Data: Lab Results  Component Value Date   WBC 8.8 10/23/2020   HGB 14.3 11/13/2020   HCT 42.0 11/13/2020   MCV 92.4 10/23/2020   PLT 223.0 10/23/2020    Lab Results  Component Value Date   CREATININE 0.90 11/13/2020    Lab Results  Component Value Date   PSA 0.78 08/30/2018   PSA 1.38 06/17/2016   PSA 0.83 02/23/2013     Lab Results  Component Value Date   HGBA1C 6.3 10/23/2020        Component Value Date/Time    CHOL 155 10/23/2020 0934   CHOL 158 03/29/2013 0759   HDL 45.90 10/23/2020 0934   HDL 31 (L) 03/29/2013 0759   CHOLHDL 3 10/23/2020 0934   VLDL 12.8 10/23/2020 0934   VLDL 39 03/29/2013 0759   LDLCALC 96 10/23/2020 0934   LDLCALC 88 03/29/2013 0759    Lab Results  Component Value Date   AST 15 10/23/2020   Lab Results  Component Value Date   ALT 12 10/23/2020    Urinalysis Component     Latest Ref Rng & Units 12/30/2020  Color, Urine     YELLOW AMBER (A)  Appearance     CLEAR CLEAR  Specific Gravity, Urine     1.005 - 1.030 1.020  pH     5.0 - 8.0 5.5  Glucose, UA     NEGATIVE mg/dL NEGATIVE  Hgb urine dipstick     NEGATIVE LARGE (A)  Bilirubin Urine     NEGATIVE SMALL (A)  Ketones, ur     NEGATIVE mg/dL TRACE (A)  Protein     NEGATIVE mg/dL 30 (A)  Nitrite     NEGATIVE NEGATIVE  Leukocytes,Ua     NEGATIVE NEGATIVE  RBC / HPF     0 - 5 RBC/hpf >50  WBC, UA     0 - 5 WBC/hpf 6-10  Bacteria, UA     NONE SEEN FEW (A)  Squamous Epithelial / LPF     0 - 5 0-5  I have reviewed the labs.   Pertinent Imaging: CLINICAL DATA:  Right flank pain  EXAM: ABDOMEN - 1 VIEW  COMPARISON:  06/11/2020  FINDINGS: The bowel gas pattern is normal. No radio-opaque calculi or other significant radiographic abnormality are seen.  IMPRESSION: Negative.   Electronically Signed   By: Constance Holster M.D.   On: 12/30/2020 15:38  CLINICAL DATA:  Right flank pain  EXAM: CT ABDOMEN AND PELVIS WITHOUT CONTRAST  TECHNIQUE: Multidetector CT imaging of the abdomen and pelvis was performed following the standard protocol without IV contrast.  COMPARISON:  06/15/2020  FINDINGS: Lower chest: The lung bases are clear. The heart size is normal.  Hepatobiliary: The liver  is normal. Normal gallbladder.There is no biliary ductal dilation.  Pancreas: Normal contours without ductal dilatation. No peripancreatic fluid collection.  Spleen:  Unremarkable.  Adrenals/Urinary Tract:  --Adrenal glands: Unremarkable.  --Right kidney/ureter: No hydronephrosis or radiopaque kidney stones.  --Left kidney/ureter: There multiple simple appearing left-sided renal cysts.  --Urinary bladder: Unremarkable.  Stomach/Bowel:  --Stomach/Duodenum: No hiatal hernia or other gastric abnormality. Normal duodenal course and caliber.  --Small bowel: Unremarkable.  --Colon: Unremarkable.  --Appendix: Not visualized. No right lower quadrant inflammation or free fluid.  Vascular/Lymphatic: Atherosclerotic calcification is present within the non-aneurysmal abdominal aorta, without hemodynamically significant stenosis.  --No retroperitoneal lymphadenopathy.  --No mesenteric lymphadenopathy.  --No pelvic or inguinal lymphadenopathy.  Reproductive: Unremarkable  Other: No ascites or free air. The abdominal wall is normal.  Musculoskeletal. No acute displaced fractures.  IMPRESSION: No acute abnormality. No evidence for hydronephrosis. No radiopaque kidney stones.   Electronically Signed   By: Constance Holster M.D.   On: 12/30/2020 15:38  I have independently reviewed the films.  See HPI.   Assessment & Plan:    1. Right flank pain -UA with micro heme -KUB no stone visible -CT renal stone no stone seen  -hydrocodone/APAP 325/125, q 4 hours prn for pain, # 10 given - PDMP -tamsulosin 0.4 mg daily   2. Gross hematuria -UA with micro heme -urine culture sent -prescription sent for Septra DS empirically   Return for follow up next week for repeat UA and symptoms recheck .  These notes generated with voice recognition software. I apologize for typographical errors.  Zara Council, PA-C  [facility] [address]  [place], [place] [address] [phone]

## 2020-12-31 LAB — URINE CULTURE: Culture: NO GROWTH

## 2021-01-06 NOTE — Progress Notes (Signed)
01/07/2021 11:36 AM   Isaiah Rangel [birth year] [id number]  Referring provider: Owens Loffler, MD [address] [place],  [place] [address]  Chief Complaint  Patient presents with  . Nephrolithiasis   Urological history: 1. Nephrolithiasis -most recent episode in 05/2020 -underwent staged URS in 05/2020 for left sided stone   HPI: Isaiah Rangel is a 60 y.o. male who presents today for follow up for persistent hematuria.   He presented to the office on 12/30/2020 with symptoms concerning for renal colic, but CT Renal stone study did not demonstrate a stone.  He was found to have gross hematuria and >50 RBC's with a negative culture at that visit.  His PVR is 1 mL.    He states he had a recent episode which was reminisces to passage of the stone.  He states the flank pain was very intense and he can feel it traveling through his body and then it abated quickly.  He does not recall seeing an actual fragment, but he feels that it was passage of the stone since the pain abated as quickly as it had initiated.  Today, he has no complaints.  Patient denies any modifying or aggravating factors.  Patient denies any gross hematuria, dysuria or suprapubic/flank pain.  Patient denies any fevers, chills, nausea or vomiting.    PMH: Past Medical History:  Diagnosis Date  . Allergy   . Anxiety   . BPH (benign prostatic hyperplasia)   . Cancer (HCC)    basal and squamous  . COPD (chronic obstructive pulmonary disease) ([facility])   . Coronary vasospasm ([place])    a. 03/2019 Cath: RCA 37m but only 40% after intracoronary ntg-->Imdur started.  Marland Kitchen GERD (gastroesophageal reflux disease)   . History of echocardiogram    a. 03/2019 Echo: EF 50-55%, nl RV size/fxn. Trace MR/TR.  Marland Kitchen History of kidney stones   . Hyperlipidemia   . Myocardial infarction ([facility]) 03/2019   NO STENTS  . Non-obstructive CAD (coronary artery disease)    a. 03/2013 Cath: min irregs. EF >55%; b. 03/2019 Cath: LM nl,  LAD nl, LCX nl, OM1 40, RCA 30p, 53m-->40% after IC NTG.  . OA (osteoarthritis)   . Orthostatic hypertension   . Tobacco abuse     Surgical History: Past Surgical History:  Procedure Laterality Date  . BACK SURGERY  1998   NO METAL  . CARDIAC CATHETERIZATION  04/06/13   [facility]- minor luminal irregularities, otherwise normal cors; EF > 55%. Medical management and smoking cessation recommended  . CATARACT EXTRACTION W/PHACO Right 08/28/2020   Procedure: CATARACT EXTRACTION PHACO AND INTRAOCULAR LENS PLACEMENT ([place]) RIGHT Tyler Deis;  Surgeon: Leandrew Koyanagi, MD;  Location: [facility];  Service: Ophthalmology;  Laterality: Right;  1.77 0:31.9 5.6%  . CYSTOSCOPY/URETEROSCOPY/HOLMIUM LASER/STENT PLACEMENT Left 06/13/2020   Procedure: CYSTOSCOPY/URETEROSCOPY//STENT PLACEMENT;  Surgeon: Hollice Espy, MD;  Location: [facility];  Service: Urology;  Laterality: Left;  . CYSTOSCOPY/URETEROSCOPY/HOLMIUM LASER/STENT PLACEMENT Left 06/24/2020   Procedure: CYSTOSCOPY/URETEROSCOPY/HOLMIUM LASER/STENT PLACEMENT;  Surgeon: Hollice Espy, MD;  Location: [facility];  Service: Urology;  Laterality: Left;  . KNEE ARTHROSCOPY W/ PARTIAL MEDIAL MENISCECTOMY Right 2012   [place], 2012  . KNEE ARTHROSCOPY WITH MEDIAL MENISECTOMY Left 11/13/2020   Procedure: Left knee arthroscopy with partial medial or lateral menisectomy, possible chondroplasty, possible partial synovectomy;  Surgeon: Lovell Sheehan, MD;  Location: [facility];  Service: Orthopedics;  Laterality: Left;  . LEFT HEART CATH AND CORONARY ANGIOGRAPHY N/A 03/20/2019   Procedure:  LEFT HEART CATH AND CORONARY ANGIOGRAPHY;  Surgeon: Troy Sine, MD;  Location: [facility];  Service: Cardiovascular;  Laterality: N/A;  . NASAL SINUS SURGERY    . neck fusion  2000  . TONSILLECTOMY    . VASECTOMY      Home Medications:  Allergies as of 01/07/2021      Reactions   Other Nausea And Vomiting, Other (See Comments)   Quail eggs caused  severe stomach pain and n/v      Medication List       Accurate as of Jan 07, 2021 11:59 PM. If you have any questions, ask your nurse or doctor.        acetaminophen 500 MG tablet Commonly known as: TYLENOL Take 500-1,000 mg by mouth every 6 (six) hours as needed for moderate pain.   albuterol 108 (90 Base) MCG/ACT inhaler Commonly known as: VENTOLIN HFA Inhale 2 puffs into the lungs every 6 (six) hours as needed for wheezing or shortness of breath.   amLODipine 2.5 MG tablet Commonly known as: NORVASC TAKE 1 TABLET BY MOUTH  DAILY   aspirin EC 81 MG tablet Take 81 mg by mouth daily.   Dulera 200-5 MCG/ACT Aero Generic drug: mometasone-formoterol Inhale 2 puffs into the lungs 2 (two) times daily.   escitalopram 10 MG tablet Commonly known as: LEXAPRO TAKE 1 TABLET BY MOUTH  DAILY What changed:   how much to take  how to take this  when to take this  additional instructions   ezetimibe 10 MG tablet Commonly known as: ZETIA TAKE 1 TABLET BY MOUTH  DAILY   Fish Oil 1200 MG Caps Take 1,200 mg by mouth daily.   fludrocortisone 0.1 MG tablet Commonly known as: FLORINEF TAKE 2 TABLETS BY MOUTH  DAILY   HYDROcodone-acetaminophen 5-325 MG tablet Commonly known as: NORCO/VICODIN Take 1 tablet by mouth every 6 (six) hours as needed for moderate pain.   ibuprofen 200 MG tablet Commonly known as: ADVIL Take 200-800 mg by mouth every 6 (six) hours as needed for moderate pain.   isosorbide mononitrate 30 MG 24 hr tablet Commonly known as: IMDUR TAKE 1 TABLET BY MOUTH  DAILY   methocarbamol 750 MG tablet Commonly known as: ROBAXIN Take 750 mg by mouth every 8 (eight) hours as needed for muscle spasms.   multivitamin with minerals Tabs tablet Take 1 tablet by mouth daily.   nitroGLYCERIN 0.4 MG SL tablet Commonly known as: NITROSTAT DISSOLVE 1 TABLET UNDER THE TONGUE EVERY 5 MINUTES AS NEEDED FOR CHEST PAIN   sulfamethoxazole-trimethoprim 800-160 MG  tablet Commonly known as: BACTRIM DS Take 1 tablet by mouth every 12 (twelve) hours.   tamsulosin 0.4 MG Caps capsule Commonly known as: FLOMAX Take 1 capsule (0.4 mg total) by mouth daily.   ZINC-VITAMIN C PO Take 1 tablet by mouth daily.       Allergies:  Allergies  Allergen Reactions  . Other Nausea And Vomiting and Other (See Comments)    Quail eggs caused severe stomach pain and n/v    Family History: Family History  Problem Relation Age of Onset  . Arthritis Father   . Coronary artery disease Father   . Hypertension Father   . CVA Father 9  . Colon cancer Mother   . Heart attack Brother   . Heart attack Brother   . Coronary artery disease Brother   . Esophageal cancer Neg Hx   . Stomach cancer Neg Hx   . Rectal  cancer Neg Hx     Social History:  reports that he has been smoking cigarettes. He has a 40.00 pack-year smoking history. He has never used smokeless tobacco. He reports that he does not drink alcohol and does not use drugs.  ROS: Pertinent ROS in HPI  Physical Exam: BP 113/74   Pulse 85   Ht 5\' 5"  (1.651 m)   Wt 130 lb (59 kg)   BMI 21.63 kg/m   Constitutional:  Well nourished. Alert and oriented, No acute distress. HEENT: [place] AT, mask in place.  Trachea midline Cardiovascular: No clubbing, cyanosis, or edema. Respiratory: Normal respiratory effort, no increased work of breathing. Neurologic: Grossly intact, no focal deficits, moving all 4 extremities. Psychiatric: Normal mood and affect.   Laboratory Data: No new labs I have reviewed the labs.   Pertinent Imaging: CLINICAL DATA:  Right flank pain  EXAM: CT ABDOMEN AND PELVIS WITHOUT CONTRAST  TECHNIQUE: Multidetector CT imaging of the abdomen and pelvis was performed following the standard protocol without IV contrast.  COMPARISON:  06/15/2020  FINDINGS: Lower chest: The lung bases are clear. The heart size is normal.  Hepatobiliary: The liver is normal. Normal  gallbladder.There is no biliary ductal dilation.  Pancreas: Normal contours without ductal dilatation. No peripancreatic fluid collection.  Spleen: Unremarkable.  Adrenals/Urinary Tract:  --Adrenal glands: Unremarkable.  --Right kidney/ureter: No hydronephrosis or radiopaque kidney stones.  --Left kidney/ureter: There multiple simple appearing left-sided renal cysts.  --Urinary bladder: Unremarkable.  Stomach/Bowel:  --Stomach/Duodenum: No hiatal hernia or other gastric abnormality. Normal duodenal course and caliber.  --Small bowel: Unremarkable.  --Colon: Unremarkable.  --Appendix: Not visualized. No right lower quadrant inflammation or free fluid.  Vascular/Lymphatic: Atherosclerotic calcification is present within the non-aneurysmal abdominal aorta, without hemodynamically significant stenosis.  --No retroperitoneal lymphadenopathy.  --No mesenteric lymphadenopathy.  --No pelvic or inguinal lymphadenopathy.  Reproductive: Unremarkable  Other: No ascites or free air. The abdominal wall is normal.  Musculoskeletal. No acute displaced fractures.  IMPRESSION: No acute abnormality. No evidence for hydronephrosis. No radiopaque kidney stones.   Electronically Signed   By: Constance Holster M.D.   On: 12/30/2020 15:38   I have independently reviewed the films.  See HPI.   Assessment & Plan:    1. Right flank pain -resolved -May have been secondary to passage of the ureteral stone  2. Gross hematuria -We will recheck UA in 1 month to ensure no further hematuria persists -If hematuria persists, we may need to consider hematuria work-up  Return in about 1 month (around 02/07/2021) for UA only and keep follow up wtih Dr. Erlene Quan in 07/2021 .  These notes generated with voice recognition software. I apologize for typographical errors.  Zara Council, PA-C  [facility] [address]  [place], [place] [address] [phone]

## 2021-01-07 ENCOUNTER — Other Ambulatory Visit: Payer: Self-pay

## 2021-01-07 ENCOUNTER — Encounter: Payer: Self-pay | Admitting: Urology

## 2021-01-07 ENCOUNTER — Ambulatory Visit (INDEPENDENT_AMBULATORY_CARE_PROVIDER_SITE_OTHER): Payer: 59 | Admitting: Urology

## 2021-01-07 VITALS — BP 113/74 | HR 85 | Ht 65.0 in | Wt 130.0 lb

## 2021-01-07 DIAGNOSIS — R109 Unspecified abdominal pain: Secondary | ICD-10-CM

## 2021-01-07 DIAGNOSIS — R310 Gross hematuria: Secondary | ICD-10-CM

## 2021-01-07 DIAGNOSIS — R3129 Other microscopic hematuria: Secondary | ICD-10-CM | POA: Diagnosis not present

## 2021-01-07 LAB — BLADDER SCAN AMB NON-IMAGING

## 2021-01-08 ENCOUNTER — Telehealth: Payer: Self-pay | Admitting: Family Medicine

## 2021-01-08 NOTE — Telephone Encounter (Signed)
-----   Message from Tammy M Bell sent at 01/08/2021  8:09 AM EDT -----  ----- Message ----- From: McGowan, Shannon A, PA-C Sent: 12/30/2020   3:46 PM EDT To: Tammy L Moore-Andrews  I spoke with Mr. Begue regarding his CT results.  We will schedule an appointment next week for follow up symptom and urine recheck.    Would you call him and get him in sometime next week with me?   

## 2021-01-08 NOTE — Telephone Encounter (Signed)
Appointment has been made

## 2021-01-08 NOTE — Telephone Encounter (Signed)
-----   Message from Benard Halsted sent at 01/08/2021  8:09 AM EDT -----  ----- Message ----- From: Laneta Simmers Sent: 12/30/2020   3:46 PM EDT To: Damaris Hippo  I spoke with Mr. Molinelli regarding his CT results.  We will schedule an appointment next week for follow up symptom and urine recheck.    Would you call him and get him in sometime next week with me?

## 2021-01-10 ENCOUNTER — Other Ambulatory Visit: Payer: Self-pay | Admitting: *Deleted

## 2021-01-10 ENCOUNTER — Encounter: Payer: Self-pay | Admitting: Urology

## 2021-01-10 ENCOUNTER — Other Ambulatory Visit: Payer: Self-pay

## 2021-01-10 DIAGNOSIS — R3129 Other microscopic hematuria: Secondary | ICD-10-CM

## 2021-01-14 ENCOUNTER — Telehealth: Payer: Self-pay | Admitting: Family Medicine

## 2021-01-15 NOTE — Telephone Encounter (Signed)
Pharmacy requests refill on: Escitalopram 10 mg   LAST REFILL: 10/22/2020 (Q-90, R-0) LAST OV: 10/09/2019 NEXT OV: Not Scheduled  PHARMACY: [facility]

## 2021-01-20 NOTE — Telephone Encounter (Signed)
Spoke with Patient schedule Cpe with fasting labs

## 2021-01-29 ENCOUNTER — Other Ambulatory Visit: Payer: Self-pay

## 2021-01-29 ENCOUNTER — Other Ambulatory Visit (INDEPENDENT_AMBULATORY_CARE_PROVIDER_SITE_OTHER): Payer: 59

## 2021-01-29 DIAGNOSIS — Z131 Encounter for screening for diabetes mellitus: Secondary | ICD-10-CM

## 2021-01-29 DIAGNOSIS — Z79899 Other long term (current) drug therapy: Secondary | ICD-10-CM

## 2021-01-29 DIAGNOSIS — E785 Hyperlipidemia, unspecified: Secondary | ICD-10-CM | POA: Diagnosis not present

## 2021-01-29 DIAGNOSIS — Z125 Encounter for screening for malignant neoplasm of prostate: Secondary | ICD-10-CM

## 2021-01-29 LAB — CBC WITH DIFFERENTIAL/PLATELET
Basophils Absolute: 0.1 10*3/uL (ref 0.0–0.1)
Basophils Relative: 1.1 % (ref 0.0–3.0)
Eosinophils Absolute: 0.3 10*3/uL (ref 0.0–0.7)
Eosinophils Relative: 3.3 % (ref 0.0–5.0)
HCT: 41.3 % (ref 39.0–52.0)
Hemoglobin: 13.8 g/dL (ref 13.0–17.0)
Lymphocytes Relative: 38.6 % (ref 12.0–46.0)
Lymphs Abs: 3.5 10*3/uL (ref 0.7–4.0)
MCHC: 33.5 g/dL (ref 30.0–36.0)
MCV: 94.3 fl (ref 78.0–100.0)
Monocytes Absolute: 0.8 10*3/uL (ref 0.1–1.0)
Monocytes Relative: 8.9 % (ref 3.0–12.0)
Neutro Abs: 4.4 10*3/uL (ref 1.4–7.7)
Neutrophils Relative %: 48.1 % (ref 43.0–77.0)
Platelets: 213 10*3/uL (ref 150.0–400.0)
RBC: 4.38 Mil/uL (ref 4.22–5.81)
RDW: 18 % — ABNORMAL HIGH (ref 11.5–15.5)
WBC: 9.1 10*3/uL (ref 4.0–10.5)

## 2021-01-29 LAB — HEPATIC FUNCTION PANEL
ALT: 24 U/L (ref 0–53)
AST: 19 U/L (ref 0–37)
Albumin: 4.3 g/dL (ref 3.5–5.2)
Alkaline Phosphatase: 88 U/L (ref 39–117)
Bilirubin, Direct: 0.1 mg/dL (ref 0.0–0.3)
Total Bilirubin: 0.5 mg/dL (ref 0.2–1.2)
Total Protein: 6.4 g/dL (ref 6.0–8.3)

## 2021-01-29 LAB — BASIC METABOLIC PANEL
BUN: 13 mg/dL (ref 6–23)
CO2: 32 mEq/L (ref 19–32)
Calcium: 9.3 mg/dL (ref 8.4–10.5)
Chloride: 106 mEq/L (ref 96–112)
Creatinine, Ser: 1.02 mg/dL (ref 0.40–1.50)
GFR: 79.71 mL/min (ref >60.00)
Glucose, Bld: 115 mg/dL — ABNORMAL HIGH (ref 70–99)
Potassium: 4 mEq/L (ref 3.5–5.1)
Sodium: 143 mEq/L (ref 135–145)

## 2021-01-29 LAB — LIPID PANEL
Cholesterol: 95 mg/dL (ref 0–200)
HDL: 41.2 mg/dL (ref >39.00)
LDL Cholesterol: 43 mg/dL (ref 0–99)
NonHDL: 53.47
Total CHOL/HDL Ratio: 2
Triglycerides: 52 mg/dL (ref 0.0–149.0)
VLDL: 10.4 mg/dL (ref 0.0–40.0)

## 2021-01-29 LAB — HEMOGLOBIN A1C: Hgb A1c MFr Bld: 6.6 % — ABNORMAL HIGH (ref 4.6–6.5)

## 2021-01-29 NOTE — Addendum Note (Signed)
Addended by: Cloyd Stagers on: 01/29/2021 08:58 AM   Modules accepted: Orders

## 2021-01-30 ENCOUNTER — Other Ambulatory Visit: Payer: Self-pay | Admitting: Family

## 2021-01-30 LAB — PSA, TOTAL WITH REFLEX TO PSA, FREE: PSA, Total: 1 ng/mL (ref <=4.0)

## 2021-02-04 ENCOUNTER — Encounter: Payer: Self-pay | Admitting: Family Medicine

## 2021-02-04 NOTE — Progress Notes (Signed)
Isaiah Rangel T. Arlisa Leclere, MD, [place] at [facility] [place], [address]  Phone: [phone]  FAX: [place] - 60 y.o. male  MRN [id number]  Date of Birth: November 23, 1960  Date: 02/05/2021  PCP: Owens Loffler, MD  Referral: Owens Loffler, MD  Chief Complaint  Patient presents with   Annual Exam    This visit occurred during the SARS-CoV-2 public health emergency.  Safety protocols were in place, including screening questions prior to the visit, additional usage of staff PPE, and extensive cleaning of exam room while observing appropriate contact time as indicated for disinfecting solutions.   Patient Care Team: Owens Loffler, MD as PCP - General Rockey Situ, Kathlene November, MD as PCP - Cardiology (Cardiology) Minna Merritts, MD as Consulting Physician (Cardiology) Subjective:   Isaiah Rangel is a 60 y.o. pleasant patient who presents with the following:  Preventative Health Maintenance Visit:  Health Maintenance Summary Reviewed and updated, unless pt declines services.  Tobacco History Reviewed.  Still smoking Alcohol: No concerns, no excessive use Exercise Habits: Some activity, rec at least 30 mins 5 times a week.  Extremely active with work working in [employer]. STD concerns: no risk or activity to increase risk Drug Use: None  COVID-vaccine? Can get #3 Shingles vaccine?  He is compliant with taking all of his heart medication and he has had some trouble with the cost of maintenance inhalers.  No CP now with IMDUR.  Lab Results  Component Value Date   HGBA1C 6.6 (H) 01/29/2021     Health Maintenance  Topic Date Due   Pneumococcal Vaccine 71-61 Years old (77 of 4 - PCV13) Never done   FOOT EXAM  Never done   OPHTHALMOLOGY EXAM  Never done   Zoster Vaccines- Shingrix (1 of 2) Never done   COVID-19 Vaccine (3 - Booster for Moderna series) 06/13/2020   INFLUENZA VACCINE  03/31/2021    HEMOGLOBIN A1C  07/31/2021   COLONOSCOPY (Pts 45-24yrs Insurance coverage will need to be confirmed)  12/13/2023   TETANUS/TDAP  06/22/2026   PNEUMOCOCCAL POLYSACCHARIDE VACCINE AGE 68-64 HIGH RISK  Completed   Hepatitis C Screening  Completed   HIV Screening  Completed   HPV VACCINES  Aged Out   Immunization History  Administered Date(s) Administered   Influenza Inj Mdck Quad Pf 10/09/2019   Influenza Whole 07/09/2010   Influenza, Seasonal, Injecte, Preservative Fre 06/18/2015   Influenza,inj,Quad PF,6+ Mos 06/22/2016, 06/13/2020   Moderna Sars-Covid-2 Vaccination 12/15/2019, 01/12/2020   Pneumococcal Polysaccharide-23 10/09/2019   Tdap 06/22/2016   Patient Active Problem List   Diagnosis Date Noted   NSTEMI (non-ST elevated myocardial infarction) ([place]) 03/20/2019    Priority: High   Coronary artery disease involving native coronary artery of native heart without angina pectoris 02/21/2016    Priority: High   Cerebrovascular disease 04/29/2015    Priority: High   Diabetes mellitus type 2, diet-controlled ([place]) 02/05/2021    Priority: Medium   COPD (chronic obstructive pulmonary disease) with chronic bronchitis ([place]) 09/05/2018    Priority: Medium   S/P cardiac catheterization 04/19/2013    Priority: Medium   Smoking 09/09/2011    Priority: Medium   Mixed hyperlipidemia 04/09/2010    Priority: Medium   Nephrolithiasis 06/13/2020   Depression    Acute ischemic enteritis ([place]) 11/26/2017   GERD 09/21/2008   ERECTILE DYSFUNCTION 09/20/2008    Past Medical History:  Diagnosis Date   Anxiety  BPH (benign prostatic hyperplasia)    COPD (chronic obstructive pulmonary disease) (HCC)    Coronary artery disease    a. 03/2013 Cath: min irregs. EF >55%; b. 03/2019 Cath: LM nl, LAD nl, LCX nl, OM1 40, RCA 30p, 89m-->40% after IC NTG.   Diabetes mellitus type 2, diet-controlled ([place]) 02/05/2021   GERD (gastroesophageal reflux disease)    History of echocardiogram    a. 03/2019  Echo: EF 50-55%, nl RV size/fxn. Trace MR/TR.   History of kidney stones    Hyperlipidemia    Myocardial infarction ([place]) 03/2019   NO STENTS   Skin cancer    basal and squamous   Tobacco abuse     Past Surgical History:  Procedure Laterality Date   BACK SURGERY  1998   NO METAL   CARDIAC CATHETERIZATION  04/06/13   [facility]- minor luminal irregularities, otherwise normal cors; EF > 55%. Medical management and smoking cessation recommended   CATARACT EXTRACTION W/PHACO Right 08/28/2020   Procedure: CATARACT EXTRACTION PHACO AND INTRAOCULAR LENS PLACEMENT ([place]) RIGHT Tyler Deis;  Surgeon: Leandrew Koyanagi, MD;  Location: [facility];  Service: Ophthalmology;  Laterality: Right;  1.77 0:31.9 5.6%   CYSTOSCOPY/URETEROSCOPY/HOLMIUM LASER/STENT PLACEMENT Left 06/13/2020   Procedure: CYSTOSCOPY/URETEROSCOPY//STENT PLACEMENT;  Surgeon: Hollice Espy, MD;  Location: [facility];  Service: Urology;  Laterality: Left;   CYSTOSCOPY/URETEROSCOPY/HOLMIUM LASER/STENT PLACEMENT Left 06/24/2020   Procedure: CYSTOSCOPY/URETEROSCOPY/HOLMIUM LASER/STENT PLACEMENT;  Surgeon: Hollice Espy, MD;  Location: [facility];  Service: Urology;  Laterality: Left;   KNEE ARTHROSCOPY W/ PARTIAL MEDIAL MENISCECTOMY Right 2012   [place], 2012   KNEE ARTHROSCOPY WITH MEDIAL MENISECTOMY Left 11/13/2020   Procedure: Left knee arthroscopy with partial medial or lateral menisectomy, possible chondroplasty, possible partial synovectomy;  Surgeon: Lovell Sheehan, MD;  Location: [facility];  Service: Orthopedics;  Laterality: Left;   LEFT HEART CATH AND CORONARY ANGIOGRAPHY N/A 03/20/2019   Procedure: LEFT HEART CATH AND CORONARY ANGIOGRAPHY;  Surgeon: Troy Sine, MD;  Location: [facility];  Service: Cardiovascular;  Laterality: N/A;   NASAL SINUS SURGERY     neck fusion  2000   TONSILLECTOMY     VASECTOMY      Family History  Problem Relation Age of Onset   Arthritis Father    Coronary artery disease  Father    Hypertension Father    CVA Father 40   Colon cancer Mother    Heart attack Brother    Heart attack Brother    Coronary artery disease Brother    Esophageal cancer Neg Hx    Stomach cancer Neg Hx    Rectal cancer Neg Hx     Past Medical History, Surgical History, Social History, Family History, Problem List, Medications, and Allergies have been reviewed and updated if relevant.  Review of Systems: Pertinent positives are listed above.  Otherwise, a full 14 point review of systems has been done in full and it is negative except where it is noted positive.  Objective:   BP 134/86   Pulse 71   Temp 97.7 F (36.5 C) (Temporal)   Ht 5\' 5"  (1.651 m)   Wt 134 lb (60.8 kg)   SpO2 94%   BMI 22.30 kg/m  Ideal Body Weight: Weight in (lb) to have BMI = 25: 149.9  Ideal Body Weight: Weight in (lb) to have BMI = 25: 149.9 No exam data present Depression screen [facility] 2/9 02/05/2021 10/09/2019 09/05/2018 04/07/2017  Decreased Interest 0 0 0 2  Down, Depressed, Hopeless  0 0 0 1  PHQ - 2 Score 0 0 0 3  Altered sleeping 0 - - 1  Tired, decreased energy 1 - - 1  Change in appetite 1 - - 1  Feeling bad or failure about yourself  0 - - 0  Trouble concentrating 0 - - 1  Moving slowly or fidgety/restless 0 - - 0  Suicidal thoughts 0 - - 0  PHQ-9 Score 2 - - 7  Difficult doing work/chores Not difficult at all - - Not difficult at all     GEN: well developed, well nourished, no acute distress Eyes: conjunctiva and lids normal, PERRLA, EOMI ENT: TM clear, nares clear, oral exam WNL Neck: supple, no lymphadenopathy, no thyromegaly, no JVD Pulm: clear to auscultation and percussion, respiratory effort normal CV: regular rate and rhythm, S1-S2, no murmur, rub or gallop, no bruits, peripheral pulses normal and symmetric, no cyanosis, clubbing, edema or varicosities GI: soft, non-tender; no hepatosplenomegaly, masses; active bowel sounds all quadrants GU: deferred Lymph: no cervical, axillary  or inguinal adenopathy MSK: gait normal, muscle tone and strength WNL, no joint swelling, effusions, discoloration, crepitus  SKIN: clear, good turgor, color WNL, no rashes, lesions, or ulcerations Neuro: normal mental status, normal strength, sensation, and motion Psych: alert; oriented to person, place and time, normally interactive and not anxious or depressed in appearance.  All labs reviewed with patient. Results for orders placed or performed in visit on 01/29/21  PSA, Total with Reflex to PSA, Free  Result Value Ref Range   PSA, Total 1.0 < OR = 4.0 ng/mL  Hemoglobin A1c  Result Value Ref Range   Hgb A1c MFr Bld 6.6 (H) 4.6 - 6.5 %  Basic metabolic panel  Result Value Ref Range   Sodium 143 135 - 145 mEq/L   Potassium 4.0 3.5 - 5.1 mEq/L   Chloride 106 96 - 112 mEq/L   CO2 32 19 - 32 mEq/L   Glucose, Bld 115 (H) 70 - 99 mg/dL   BUN 13 6 - 23 mg/dL   Creatinine, Ser 1.02 0.40 - 1.50 mg/dL   GFR 79.71 >60.00 mL/min   Calcium 9.3 8.4 - 10.5 mg/dL  Hepatic function panel  Result Value Ref Range   Total Bilirubin 0.5 0.2 - 1.2 mg/dL   Bilirubin, Direct 0.1 0.0 - 0.3 mg/dL   Alkaline Phosphatase 88 39 - 117 U/L   AST 19 0 - 37 U/L   ALT 24 0 - 53 U/L   Total Protein 6.4 6.0 - 8.3 g/dL   Albumin 4.3 3.5 - 5.2 g/dL  CBC with Differential/Platelet  Result Value Ref Range   WBC 9.1 4.0 - 10.5 K/uL   RBC 4.38 4.22 - 5.81 Mil/uL   Hemoglobin 13.8 13.0 - 17.0 g/dL   HCT 41.3 39.0 - 52.0 %   MCV 94.3 78.0 - 100.0 fl   MCHC 33.5 30.0 - 36.0 g/dL   RDW 18.0 (H) 11.5 - 15.5 %   Platelets 213.0 150.0 - 400.0 K/uL   Neutrophils Relative % 48.1 43.0 - 77.0 %   Lymphocytes Relative 38.6 12.0 - 46.0 %   Monocytes Relative 8.9 3.0 - 12.0 %   Eosinophils Relative 3.3 0.0 - 5.0 %   Basophils Relative 1.1 0.0 - 3.0 %   Neutro Abs 4.4 1.4 - 7.7 K/uL   Lymphs Abs 3.5 0.7 - 4.0 K/uL   Monocytes Absolute 0.8 0.1 - 1.0 K/uL   Eosinophils Absolute 0.3 0.0 - 0.7 K/uL  Basophils Absolute  0.1 0.0 - 0.1 K/uL  Lipid panel  Result Value Ref Range   Cholesterol 95 0 - 200 mg/dL   Triglycerides 52.0 0.0 - 149.0 mg/dL   HDL 41.20 >39.00 mg/dL   VLDL 10.4 0.0 - 40.0 mg/dL   LDL Cholesterol 43 0 - 99 mg/dL   Total CHOL/HDL Ratio 2    NonHDL 53.47     Assessment and Plan:     ICD-10-CM   1. Encounter for health maintenance examination with abnormal findings  Z00.01   2. Diabetes mellitus type 2, diet-controlled ([place])  E11.9   3. Smoker  F17.200 Ambulatory Referral for Lung Cancer Scre   We went over general health maintenance.  He is generally doing pretty well, albeit with some ongoing significant heart problems COPD and continued smoking.  He also has new onset type 2 diabetes.  His hemoglobin A1c is at 6.6.  His diet is not perfect, and he drinks a lot of iced tea that is sweet, and other beverages with sugar in it.  Right now, I am going to have him concentrate on eliminating his sugar with some basic changes in his diet, and then we can follow-up in 3 months.  With his long-term smoking history, will refer him for lung cancer screening.  Start Duoneb.  Hopefully this will help some with cost and compliance.   Health Maintenance Exam: The patient's preventative maintenance and recommended screening tests for an annual wellness exam were reviewed in full today. Brought up to date unless services declined.  Counselled on the importance of diet, exercise, and its role in overall health and mortality. The patient's FH and SH was reviewed, including their home life, tobacco status, and drug and alcohol status.   Meds ordered this encounter  Medications   varenicline (CHANTIX STARTING MONTH PAK) 0.5 MG X 11 & 1 MG X 42 tablet    Sig: Take one 0.5mg  tablet by mouth once daily for 3 days, then increase to one 0.5mg  tablet twice daily for 3 days, then increase to one 1mg  tablet twice daily.    Dispense:  53 tablet    Refill:  0   varenicline (CHANTIX) 1 MG tablet    Sig:  Take 1 tablet (1 mg total) by mouth 2 (two) times daily.    Dispense:  60 tablet    Refill:  3    refills   ipratropium-albuterol (DUONEB) 0.5-2.5 (3) MG/3ML SOLN    Sig: Take 3 mLs by nebulization every 6 (six) hours as needed. Take scheduled twice a day    Dispense:  360 mL    Refill:  3   There are no discontinued medications. Orders Placed This Encounter  Procedures   Ambulatory Referral for Lung Cancer Scre    Signed,  [name] T. Emmanuelle Coxe, MD   Allergies as of 02/05/2021       Reactions   Other Nausea And Vomiting, Other (See Comments)   Quail eggs caused severe stomach pain and n/v        Medication List        Accurate as of February 05, 2021  3:16 PM. If you have any questions, ask your nurse or doctor.          acetaminophen 500 MG tablet Commonly known as: TYLENOL Take 500-1,000 mg by mouth every 6 (six) hours as needed for moderate pain.   albuterol 108 (90 Base) MCG/ACT inhaler Commonly known as: VENTOLIN HFA Inhale 2 puffs into the lungs  every 6 (six) hours as needed for wheezing or shortness of breath.   amLODipine 2.5 MG tablet Commonly known as: NORVASC TAKE 1 TABLET BY MOUTH  DAILY   aspirin EC 81 MG tablet Take 81 mg by mouth daily.   atorvastatin 80 MG tablet Commonly known as: LIPITOR Take 1 tablet by mouth daily.   Dulera 200-5 MCG/ACT Aero Generic drug: mometasone-formoterol Inhale 2 puffs into the lungs 2 (two) times daily.   escitalopram 10 MG tablet Commonly known as: LEXAPRO TAKE 1 TABLET BY MOUTH  DAILY   ezetimibe 10 MG tablet Commonly known as: ZETIA TAKE 1 TABLET BY MOUTH  DAILY   Fish Oil 1200 MG Caps Take 1,200 mg by mouth daily.   fludrocortisone 0.1 MG tablet Commonly known as: FLORINEF TAKE 2 TABLETS BY MOUTH  DAILY   HYDROcodone-acetaminophen 5-325 MG tablet Commonly known as: NORCO/VICODIN Take 1 tablet by mouth every 6 (six) hours as needed for moderate pain.   ibuprofen 200 MG tablet Commonly known as:  ADVIL Take 200-800 mg by mouth every 6 (six) hours as needed for moderate pain.   ipratropium-albuterol 0.5-2.5 (3) MG/3ML Soln Commonly known as: DUONEB Take 3 mLs by nebulization every 6 (six) hours as needed. Take scheduled twice a day Started by: Owens Loffler, MD   isosorbide mononitrate 30 MG 24 hr tablet Commonly known as: IMDUR TAKE 1 TABLET BY MOUTH  DAILY   methocarbamol 750 MG tablet Commonly known as: ROBAXIN Take 750 mg by mouth every 8 (eight) hours as needed for muscle spasms.   multivitamin with minerals Tabs tablet Take 1 tablet by mouth daily.   nitroGLYCERIN 0.4 MG SL tablet Commonly known as: NITROSTAT DISSOLVE 1 TABLET UNDER THE TONGUE EVERY 5 MINUTES AS NEEDED FOR CHEST PAIN   sulfamethoxazole-trimethoprim 800-160 MG tablet Commonly known as: BACTRIM DS Take 1 tablet by mouth every 12 (twelve) hours.   tamsulosin 0.4 MG Caps capsule Commonly known as: FLOMAX Take 1 capsule (0.4 mg total) by mouth daily.   varenicline 0.5 MG X 11 & 1 MG X 42 tablet Commonly known as: Chantix Starting Month Pak Take one 0.5mg  tablet by mouth once daily for 3 days, then increase to one 0.5mg  tablet twice daily for 3 days, then increase to one 1mg  tablet twice daily. Started by: Owens Loffler, MD   varenicline 1 MG tablet Commonly known as: Chantix Take 1 tablet (1 mg total) by mouth 2 (two) times daily. Started by: Owens Loffler, MD   ZINC-VITAMIN C PO Take 1 tablet by mouth daily.

## 2021-02-05 ENCOUNTER — Encounter: Payer: Self-pay | Admitting: Family Medicine

## 2021-02-05 ENCOUNTER — Other Ambulatory Visit: Payer: Self-pay

## 2021-02-05 ENCOUNTER — Ambulatory Visit (INDEPENDENT_AMBULATORY_CARE_PROVIDER_SITE_OTHER): Payer: 59 | Admitting: Family Medicine

## 2021-02-05 VITALS — BP 134/86 | HR 71 | Temp 97.7°F | Ht 65.0 in | Wt 134.0 lb

## 2021-02-05 DIAGNOSIS — F17200 Nicotine dependence, unspecified, uncomplicated: Secondary | ICD-10-CM

## 2021-02-05 DIAGNOSIS — Z0001 Encounter for general adult medical examination with abnormal findings: Secondary | ICD-10-CM

## 2021-02-05 DIAGNOSIS — E119 Type 2 diabetes mellitus without complications: Secondary | ICD-10-CM | POA: Insufficient documentation

## 2021-02-05 DIAGNOSIS — Z23 Encounter for immunization: Secondary | ICD-10-CM

## 2021-02-05 HISTORY — DX: Type 2 diabetes mellitus without complications: E11.9

## 2021-02-05 MED ORDER — VARENICLINE TARTRATE 0.5 MG X 11 & 1 MG X 42 PO MISC: ORAL | 0 refills | Status: DC

## 2021-02-05 MED ORDER — VARENICLINE TARTRATE 1 MG PO TABS: 1.0000 mg | ORAL_TABLET | Freq: Two times a day (BID) | ORAL | 3 refills | Status: DC

## 2021-02-05 MED ORDER — IPRATROPIUM-ALBUTEROL 0.5-2.5 (3) MG/3ML IN SOLN: 3.0000 mL | Freq: Four times a day (QID) | RESPIRATORY_TRACT | 3 refills | Status: DC | PRN

## 2021-02-05 NOTE — Patient Instructions (Signed)
Check with your insurance to see if they will cover the shingles shot.  The newer [facility] shot is much better than the older shot. The [facility] shot requires 2 shots given 6 months apart.  Almost always, this shot is not covered in our office by your insurance. It costs 500 dollars, but essentially all insurances cover it at your pharmacy.  I would call your insurance number on your card to confirm this.

## 2021-02-07 ENCOUNTER — Encounter: Payer: Self-pay | Admitting: Family Medicine

## 2021-02-12 ENCOUNTER — Other Ambulatory Visit: Payer: Self-pay | Admitting: *Deleted

## 2021-02-12 DIAGNOSIS — Z87891 Personal history of nicotine dependence: Secondary | ICD-10-CM

## 2021-02-12 DIAGNOSIS — F17210 Nicotine dependence, cigarettes, uncomplicated: Secondary | ICD-10-CM

## 2021-02-27 ENCOUNTER — Ambulatory Visit (INDEPENDENT_AMBULATORY_CARE_PROVIDER_SITE_OTHER): Payer: 59 | Admitting: Family Medicine

## 2021-02-27 ENCOUNTER — Other Ambulatory Visit: Payer: Self-pay

## 2021-02-27 VITALS — BP 104/70 | HR 90 | Temp 98.2°F | Resp 16 | Ht 65.0 in | Wt 135.0 lb

## 2021-02-27 DIAGNOSIS — R112 Nausea with vomiting, unspecified: Secondary | ICD-10-CM

## 2021-02-27 MED ORDER — OMEPRAZOLE 40 MG PO CPDR: 40.0000 mg | DELAYED_RELEASE_CAPSULE | Freq: Every day | ORAL | 0 refills | Status: DC

## 2021-02-27 NOTE — Progress Notes (Signed)
Patient ID: Isaiah Rangel, male    DOB: July 28, 1961, 60 y.o.   MRN: [id number]  This visit was conducted in person.  BP 104/70   Pulse 90   Temp 98.2 F (36.8 C)   Resp 16   Ht 5\' 5"  (1.651 m)   Wt 135 lb (61.2 kg)   SpO2 98%   BMI 22.47 kg/m    CC:  Chief Complaint  Patient presents with   Nausea   Vomiting    Subjective:   HPI: Isaiah Rangel is a 60 y.o. male patient of Dr. Lillie Fragmin with history of  DM, CAD, acute ischemic enteritis, COPD presenting on 02/27/2021 for Nausea and Vomiting   He reports  intermittent Nausea and  occ Vomiting in last 3 months.  Occurring between midnight and 10 AM.  No diarrhea or constipation.   Largest meal of the day 8-9 PM at night, minimal food rest of day. No abdominal pain. No heartburn,  occ sour taste in throat prior to emesis.   Hx of back surgery... occ pain in left  mid back. Hx kidney stone... pain in low back is more mild.  No dysuria, no blood in urine     Chantix  two times a day... on now in last month.  Was having nausea prior to that.   ENDO 2017 normal.   No ETOH, no NSAIDs, minimal caffeine  Relevant past medical, surgical, family and social history reviewed and updated as indicated. Interim medical history since our last visit reviewed. Allergies and medications reviewed and updated. Outpatient Medications Prior to Visit  Medication Sig Dispense Refill   acetaminophen (TYLENOL) 500 MG tablet Take 500-1,000 mg by mouth every 6 (six) hours as needed for moderate pain.     albuterol (VENTOLIN HFA) 108 (90 Base) MCG/ACT inhaler Inhale 2 puffs into the lungs every 6 (six) hours as needed for wheezing or shortness of breath.     amLODipine (NORVASC) 2.5 MG tablet TAKE 1 TABLET BY MOUTH  DAILY 90 tablet 3   aspirin EC 81 MG tablet Take 81 mg by mouth daily.     atorvastatin (LIPITOR) 80 MG tablet Take 1 tablet by mouth daily.     escitalopram (LEXAPRO) 10 MG tablet TAKE 1 TABLET BY MOUTH  DAILY 90 tablet 3    ezetimibe (ZETIA) 10 MG tablet TAKE 1 TABLET BY MOUTH  DAILY 90 tablet 3   fludrocortisone (FLORINEF) 0.1 MG tablet TAKE 2 TABLETS BY MOUTH  DAILY 180 tablet 3   HYDROcodone-acetaminophen (NORCO/VICODIN) 5-325 MG tablet Take 1 tablet by mouth every 6 (six) hours as needed for moderate pain. 10 tablet 0   ibuprofen (ADVIL) 200 MG tablet Take 200-800 mg by mouth every 6 (six) hours as needed for moderate pain.     ipratropium-albuterol (DUONEB) 0.5-2.5 (3) MG/3ML SOLN Take 3 mLs by nebulization every 6 (six) hours as needed. Take scheduled twice a day 360 mL 3   isosorbide mononitrate (IMDUR) 30 MG 24 hr tablet TAKE 1 TABLET BY MOUTH  DAILY 90 tablet 1   methocarbamol (ROBAXIN) 750 MG tablet Take 750 mg by mouth every 8 (eight) hours as needed for muscle spasms.     mometasone-formoterol (DULERA) 200-5 MCG/ACT AERO Inhale 2 puffs into the lungs 2 (two) times daily. 1 each 6   Multiple Vitamin (MULTIVITAMIN WITH MINERALS) TABS tablet Take 1 tablet by mouth daily.     nitroGLYCERIN (NITROSTAT) 0.4 MG SL tablet DISSOLVE 1 TABLET UNDER THE TONGUE  EVERY 5 MINUTES AS NEEDED FOR CHEST PAIN 25 tablet 3   Omega-3 Fatty Acids (FISH OIL) 1200 MG CAPS Take 1,200 mg by mouth daily.     sulfamethoxazole-trimethoprim (BACTRIM DS) 800-160 MG tablet Take 1 tablet by mouth every 12 (twelve) hours. 14 tablet 0   tamsulosin (FLOMAX) 0.4 MG CAPS capsule Take 1 capsule (0.4 mg total) by mouth daily. 30 capsule 0   varenicline (CHANTIX STARTING MONTH PAK) 0.5 MG X 11 & 1 MG X 42 tablet Take one 0.5mg  tablet by mouth once daily for 3 days, then increase to one 0.5mg  tablet twice daily for 3 days, then increase to one 1mg  tablet twice daily. 53 tablet 0   varenicline (CHANTIX) 1 MG tablet Take 1 tablet (1 mg total) by mouth 2 (two) times daily. 60 tablet 3   ZINC-VITAMIN C PO Take 1 tablet by mouth daily.     No facility-administered medications prior to visit.     Per HPI unless specifically indicated in ROS section  below Review of Systems  Constitutional:  Negative for fatigue and fever.  HENT:  Negative for ear pain.   Eyes:  Negative for pain.  Respiratory:  Negative for cough and shortness of breath.   Cardiovascular:  Negative for chest pain, palpitations and leg swelling.  Gastrointestinal:  Negative for abdominal pain.  Genitourinary:  Negative for dysuria.  Musculoskeletal:  Negative for arthralgias.  Neurological:  Negative for syncope, light-headedness and headaches.  Psychiatric/Behavioral:  Negative for dysphoric mood.   Objective:  BP 104/70   Pulse 90   Temp 98.2 F (36.8 C)   Resp 16   Ht 5\' 5"  (1.651 m)   Wt 135 lb (61.2 kg)   SpO2 98%   BMI 22.47 kg/m   Wt Readings from Last 3 Encounters:  02/27/21 135 lb (61.2 kg)  02/05/21 134 lb (60.8 kg)  01/07/21 130 lb (59 kg)      Physical Exam Constitutional:      Appearance: He is well-developed.  HENT:     Head: Normocephalic.     Right Ear: Hearing normal.     Left Ear: Hearing normal.     Nose: Nose normal.  Neck:     Thyroid: No thyroid mass or thyromegaly.     Vascular: No carotid bruit.     Trachea: Trachea normal.  Cardiovascular:     Rate and Rhythm: Normal rate and regular rhythm.     Pulses: Normal pulses.     Heart sounds: Heart sounds not distant. No murmur heard.   No friction rub. No gallop.     Comments: No peripheral edema Pulmonary:     Effort: Pulmonary effort is normal. No respiratory distress.     Breath sounds: Normal breath sounds.  Skin:    General: Skin is warm and dry.     Findings: No rash.  Psychiatric:        Speech: Speech normal.        Behavior: Behavior normal.        Thought Content: Thought content normal.      Results for orders placed or performed in visit on 01/29/21  PSA, Total with Reflex to PSA, Free  Result Value Ref Range   PSA, Total 1.0 < OR = 4.0 ng/mL  Hemoglobin A1c  Result Value Ref Range   Hgb A1c MFr Bld 6.6 (H) 4.6 - 6.5 %  Basic metabolic panel  Result  Value Ref Range   Sodium 143 135 -  145 mEq/L   Potassium 4.0 3.5 - 5.1 mEq/L   Chloride 106 96 - 112 mEq/L   CO2 32 19 - 32 mEq/L   Glucose, Bld 115 (H) 70 - 99 mg/dL   BUN 13 6 - 23 mg/dL   Creatinine, Ser 1.02 0.40 - 1.50 mg/dL   GFR 79.71 >60.00 mL/min   Calcium 9.3 8.4 - 10.5 mg/dL  Hepatic function panel  Result Value Ref Range   Total Bilirubin 0.5 0.2 - 1.2 mg/dL   Bilirubin, Direct 0.1 0.0 - 0.3 mg/dL   Alkaline Phosphatase 88 39 - 117 U/L   AST 19 0 - 37 U/L   ALT 24 0 - 53 U/L   Total Protein 6.4 6.0 - 8.3 g/dL   Albumin 4.3 3.5 - 5.2 g/dL  CBC with Differential/Platelet  Result Value Ref Range   WBC 9.1 4.0 - 10.5 K/uL   RBC 4.38 4.22 - 5.81 Mil/uL   Hemoglobin 13.8 13.0 - 17.0 g/dL   HCT 41.3 39.0 - 52.0 %   MCV 94.3 78.0 - 100.0 fl   MCHC 33.5 30.0 - 36.0 g/dL   RDW 18.0 (H) 11.5 - 15.5 %   Platelets 213.0 150.0 - 400.0 K/uL   Neutrophils Relative % 48.1 43.0 - 77.0 %   Lymphocytes Relative 38.6 12.0 - 46.0 %   Monocytes Relative 8.9 3.0 - 12.0 %   Eosinophils Relative 3.3 0.0 - 5.0 %   Basophils Relative 1.1 0.0 - 3.0 %   Neutro Abs 4.4 1.4 - 7.7 K/uL   Lymphs Abs 3.5 0.7 - 4.0 K/uL   Monocytes Absolute 0.8 0.1 - 1.0 K/uL   Eosinophils Absolute 0.3 0.0 - 0.7 K/uL   Basophils Absolute 0.1 0.0 - 0.1 K/uL  Lipid panel  Result Value Ref Range   Cholesterol 95 0 - 200 mg/dL   Triglycerides 52.0 0.0 - 149.0 mg/dL   HDL 41.20 >39.00 mg/dL   VLDL 10.4 0.0 - 40.0 mg/dL   LDL Cholesterol 43 0 - 99 mg/dL   Total CHOL/HDL Ratio 2    NonHDL 53.47     This visit occurred during the SARS-CoV-2 public health emergency.  Safety protocols were in place, including screening questions prior to the visit, additional usage of staff PPE, and extensive cleaning of exam room while observing appropriate contact time as indicated for disinfecting solutions.   COVID 19 screen:  No recent travel or known exposure to COVID19 The patient denies respiratory symptoms of COVID 19 at  this time. The importance of social distancing was discussed today.   Assessment and Plan Problem List Items Addressed This Visit     Non-intractable vomiting - Primary     Eval with labs.  Possible GERD, gastritis, med intolerance/side effect ( chantix) vs PUD  Avoid acidic foods.. like tomato, spicy, citrus. Start Prilosec 40 mg daily to decrease acid.  If nausea not improving can consider holding chantix as it can cause nausea. Follow up with PCP in 2 weeks if not improving as expected.       Relevant Orders   Comprehensive metabolic panel (Completed)   Lipase (Completed)   CBC with Differential/Platelet (Completed)   Meds ordered this encounter  Medications   DISCONTD: omeprazole (PRILOSEC) 40 MG capsule    Sig: Take 1 capsule (40 mg total) by mouth daily.    Dispense:  30 capsule    Refill:  0       Eliezer Lofts, MD

## 2021-02-27 NOTE — Patient Instructions (Addendum)
Avoid acidic foods.. like tomato, spicy, citrus. Please stop at the lab to have labs drawn. Start Prilosec 40 mg daily to decrease acid.  If nausea not improving can consider holding chantix as it can cause nausea. Follow up with PCP in 2 weeks if not improving as expected.

## 2021-02-28 ENCOUNTER — Other Ambulatory Visit: Payer: Self-pay | Admitting: Family Medicine

## 2021-02-28 DIAGNOSIS — R112 Nausea with vomiting, unspecified: Secondary | ICD-10-CM

## 2021-02-28 DIAGNOSIS — R748 Abnormal levels of other serum enzymes: Secondary | ICD-10-CM

## 2021-02-28 LAB — CBC WITH DIFFERENTIAL/PLATELET
Basophils Absolute: 0.1 10*3/uL (ref 0.0–0.1)
Basophils Relative: 0.9 % (ref 0.0–3.0)
Eosinophils Absolute: 0.4 10*3/uL (ref 0.0–0.7)
Eosinophils Relative: 3.7 % (ref 0.0–5.0)
HCT: 34.8 % — ABNORMAL LOW (ref 39.0–52.0)
Hemoglobin: 12.1 g/dL — ABNORMAL LOW (ref 13.0–17.0)
Lymphocytes Relative: 34.6 % (ref 12.0–46.0)
Lymphs Abs: 3.6 10*3/uL (ref 0.7–4.0)
MCHC: 34.9 g/dL (ref 30.0–36.0)
MCV: 92.9 fl (ref 78.0–100.0)
Monocytes Absolute: 0.8 10*3/uL (ref 0.1–1.0)
Monocytes Relative: 8 % (ref 3.0–12.0)
Neutro Abs: 5.4 10*3/uL (ref 1.4–7.7)
Neutrophils Relative %: 52.8 % (ref 43.0–77.0)
Platelets: 198 10*3/uL (ref 150.0–400.0)
RBC: 3.75 Mil/uL — ABNORMAL LOW (ref 4.22–5.81)
RDW: 17.4 % — ABNORMAL HIGH (ref 11.5–15.5)
WBC: 10.3 10*3/uL (ref 4.0–10.5)

## 2021-02-28 LAB — COMPREHENSIVE METABOLIC PANEL
ALT: 26 U/L (ref 0–53)
AST: 21 U/L (ref 0–37)
Albumin: 4.2 g/dL (ref 3.5–5.2)
Alkaline Phosphatase: 75 U/L (ref 39–117)
BUN: 14 mg/dL (ref 6–23)
CO2: 33 mEq/L — ABNORMAL HIGH (ref 19–32)
Calcium: 9 mg/dL (ref 8.4–10.5)
Chloride: 106 mEq/L (ref 96–112)
Creatinine, Ser: 1.04 mg/dL (ref 0.40–1.50)
GFR: 77.83 mL/min (ref >60.00)
Glucose, Bld: 85 mg/dL (ref 70–99)
Potassium: 3.2 mEq/L — ABNORMAL LOW (ref 3.5–5.1)
Sodium: 146 mEq/L — ABNORMAL HIGH (ref 135–145)
Total Bilirubin: 0.6 mg/dL (ref 0.2–1.2)
Total Protein: 6.4 g/dL (ref 6.0–8.3)

## 2021-02-28 LAB — LIPASE: Lipase: 161 U/L — ABNORMAL HIGH (ref 11.0–59.0)

## 2021-03-06 ENCOUNTER — Other Ambulatory Visit: Payer: Self-pay | Admitting: Urology

## 2021-03-20 ENCOUNTER — Other Ambulatory Visit: Payer: Self-pay

## 2021-03-20 ENCOUNTER — Ambulatory Visit
Admission: RE | Admit: 2021-03-20 | Discharge: 2021-03-20 | Disposition: A | Discharge status: Home / Self Care | Payer: 59 | Source: Ambulatory Visit | Attending: Family Medicine | Admitting: Family Medicine

## 2021-03-20 DIAGNOSIS — R748 Abnormal levels of other serum enzymes: Secondary | ICD-10-CM | POA: Diagnosis present

## 2021-03-20 DIAGNOSIS — R112 Nausea with vomiting, unspecified: Secondary | ICD-10-CM

## 2021-03-24 ENCOUNTER — Encounter: Payer: Self-pay | Admitting: Acute Care

## 2021-03-24 ENCOUNTER — Other Ambulatory Visit: Payer: Self-pay

## 2021-03-24 ENCOUNTER — Ambulatory Visit (INDEPENDENT_AMBULATORY_CARE_PROVIDER_SITE_OTHER): Payer: 59 | Admitting: Acute Care

## 2021-03-24 ENCOUNTER — Ambulatory Visit
Admission: RE | Admit: 2021-03-24 | Discharge: 2021-03-24 | Disposition: A | Discharge status: Home / Self Care | Payer: 59 | Source: Ambulatory Visit | Attending: Acute Care | Admitting: Acute Care

## 2021-03-24 DIAGNOSIS — Z87891 Personal history of nicotine dependence: Secondary | ICD-10-CM | POA: Diagnosis present

## 2021-03-24 DIAGNOSIS — F17210 Nicotine dependence, cigarettes, uncomplicated: Secondary | ICD-10-CM | POA: Diagnosis not present

## 2021-03-24 NOTE — Progress Notes (Signed)
Virtual Visit via Telephone Note  I connected with Isaiah Rangel on 03/24/21 at  3:00 PM EDT by telephone and verified that I am speaking with the correct person using two identifiers.  Location: Patient: Home Provider: [place], [place], [place], [address]    I discussed the limitations, risks, security and privacy concerns of performing an evaluation and management service by telephone and the availability of in person appointments. I also discussed with the patient that there may be a patient responsible charge related to this service. The patient expressed understanding and agreed to proceed.   Shared Decision Making Visit Lung Cancer Screening Program [phone])   Eligibility: Age 60 y.o. Pack Years Smoking History Calculation 40 pack years (# packs/per year x # years smoked) Recent History of coughing up blood  no Unexplained weight loss? no ( >Than 15 pounds within the last 6 months ) Prior History Lung / other cancer no (Diagnosis within the last 5 years already requiring surveillance chest CT Scans). Smoking Status Current Smoker Former Smokers: Years since quit: NA  Quit Date: NA  Visit Components: Discussion included one or more decision making aids. yes Discussion included risk/benefits of screening. yes Discussion included potential follow up diagnostic testing for abnormal scans. yes Discussion included meaning and risk of over diagnosis. yes Discussion included meaning and risk of False Positives. yes Discussion included meaning of total radiation exposure. yes  Counseling Included: Importance of adherence to annual lung cancer LDCT screening. yes Impact of comorbidities on ability to participate in the program. yes Ability and willingness to under diagnostic treatment. yes  Smoking Cessation Counseling: Current Smokers:  Discussed importance of smoking cessation. yes Information about tobacco cessation classes and interventions provided to  patient. yes Patient provided with "ticket" for LDCT Scan. yes Symptomatic Patient. no  Counseling(Intermediate counseling: > three minutes) [id number] Diagnosis Code: Tobacco Use Z72.0 Asymptomatic Patient yes  Counseling (Intermediate counseling: > three minutes counseling) [id number] Former Smokers:  Discussed the importance of maintaining cigarette abstinence. yes Diagnosis Code: Personal History of Nicotine Dependence. [id number] Information about tobacco cessation classes and interventions provided to patient. Yes Patient provided with "ticket" for LDCT Scan. yes Written Order for Lung Cancer Screening with LDCT placed in Epic. Yes (CT Chest Lung Cancer Screening Low Dose W/O CM) [id number] Z12.2-Screening of respiratory organs Z87.891-Personal history of nicotine dependence  I have spent 25 minutes of face to face time with Isaiah Rangel discussing the risks and benefits of lung cancer screening. We viewed a power point together that explained in detail the above noted topics. We paused at intervals to allow for questions to be asked and answered to ensure understanding.We discussed that the single most powerful action that he can take to decrease his risk of developing lung cancer is to quit smoking. We discussed whether or not he is ready to commit to setting a quit date. We discussed options for tools to aid in quitting smoking including nicotine replacement therapy, non-nicotine medications, support groups, Quit Smart classes, and behavior modification. We discussed that often times setting smaller, more achievable goals, such as eliminating 1 cigarette a day for a week and then 2 cigarettes a day for a week can be helpful in slowly decreasing the number of cigarettes smoked. This allows for a sense of accomplishment as well as providing a clinical benefit. I gave him the " Be Stronger Than Your Excuses" card with contact information for community resources, classes, free nicotine replacement therapy, and  access to mobile  apps, text messaging, and on-line smoking cessation help. I have also given him my card and contact information in the event he needs to contact me. We discussed the time and location of the scan, and that either Doroteo Glassman RN or I will call with the results within 24-48 hours of receiving them. I have offered him  a copy of the power point we viewed  as a resource in the event they need reinforcement of the concepts we discussed today in the office. The patient verbalized understanding of all of  the above and had no further questions upon leaving the office. They have my contact information in the event they have any further questions.  I spent 4-5 minutes counseling on smoking cessation and the health risks of continued tobacco abuse.  I explained to the patient that there has been a high incidence of coronary artery disease noted on these exams. I explained that this is a non-gated exam therefore degree or severity cannot be determined. This patient is on statin therapy. I have asked the patient to follow-up with their PCP regarding any incidental finding of coronary artery disease and management with diet or medication as their PCP  feels is clinically indicated. The patient verbalized understanding of the above and had no further questions upon completion of the visit.      Magdalen Spatz, NP 03/24/2021

## 2021-03-24 NOTE — Patient Instructions (Signed)
Thank you for participating in the [facility]. It was our pleasure to meet you today. We will call you with the results of your scan within the next few days. Your scan will be assigned a Lung RADS category score by the physicians reading the scans.  This Lung RADS score determines follow up scanning.  See below for description of categories, and follow up screening recommendations. We will be in touch to schedule your follow up screening annually or based on recommendations of our providers. We will fax a copy of your scan results to your Primary Care Physician, or the physician who referred you to the program, to ensure they have the results. Please call the office if you have any questions or concerns regarding your scanning experience or results.  Our office number is [phone]. Please speak with Denise Phelps, RN. She is our Lung Cancer Screening RN. If she is unavailable when you call, please have the office staff send her a message. She will return your call at her earliest convenience. Remember, if your scan is normal, we will scan you annually as long as you continue to meet the criteria for the program. (Age 55-77, Current smoker or smoker who has quit within the last 15 years). If you are a smoker, remember, quitting is the single most powerful action that you can take to decrease your risk of lung cancer and other pulmonary, breathing related problems. We know quitting is hard, and we are here to help.  Please let us know if there is anything we can do to help you meet your goal of quitting. If you are a former smoker, congratulations. We are proud of you! Remain smoke free! Remember you can refer friends or family members through the number above.  We will screen them to make sure they meet criteria for the program. Thank you for helping us take better care of you by participating in Lung Screening.  Lung RADS Categories:  Lung RADS 1: no nodules  or definitely non-concerning nodules.  Recommendation is for a repeat annual scan in 12 months.  Lung RADS 2:  nodules that are non-concerning in appearance and behavior with a very low likelihood of becoming an active cancer. Recommendation is for a repeat annual scan in 12 months.  Lung RADS 3: nodules that are probably non-concerning , includes nodules with a low likelihood of becoming an active cancer.  Recommendation is for a 6-month repeat screening scan. Often noted after an upper respiratory illness. We will be in touch to make sure you have no questions, and to schedule your 6-month scan.  Lung RADS 4 A: nodules with concerning findings, recommendation is most often for a follow up scan in 3 months or additional testing based on our provider's assessment of the scan. We will be in touch to make sure you have no questions and to schedule the recommended 3 month follow up scan.  Lung RADS 4 B:  indicates findings that are concerning. We will be in touch with you to schedule additional diagnostic testing based on our provider's  assessment of the scan.   

## 2021-03-31 ENCOUNTER — Encounter: Payer: Self-pay | Admitting: *Deleted

## 2021-03-31 ENCOUNTER — Telehealth: Payer: Self-pay | Admitting: Acute Care

## 2021-03-31 DIAGNOSIS — F17210 Nicotine dependence, cigarettes, uncomplicated: Secondary | ICD-10-CM

## 2021-03-31 DIAGNOSIS — Z87891 Personal history of nicotine dependence: Secondary | ICD-10-CM

## 2021-03-31 NOTE — Telephone Encounter (Signed)
CT result letter sent to pt via Mychart. Copy of CT faxed to PCP. Order placed for 1 yr f/u low dose ct.   

## 2021-04-03 NOTE — Progress Notes (Signed)
Please call patient and let them  know their  low dose Ct was read as a Lung RADS 2: nodules that are benign in appearance and behavior with a very low likelihood of becoming a clinically active cancer due to size or lack of growth. Recommendation per radiology is for a repeat LDCT in 12 months. .Please let them  know we will order and schedule their  annual screening scan for 02/2022. Please let them  know there was notation of CAD on their  scan.  Please remind the patient  that this is a non-gated exam therefore degree or severity of disease  cannot be determined. Please have them  follow up with their PCP regarding potential risk factor modification, dietary therapy or pharmacologic therapy if clinically indicated. Pt.  is  currently on statin therapy. Please place order for annual  screening scan for  02/2022 and fax results to PCP. Thanks so much.  There was notation of Mild coronary artery calcification is evident. Mild atherosclerotic calcification is noted in the wall of the thoracic aorta.Have patient follow up with PCP for follow up as they feel is clinically indicated. Thanks so much

## 2021-04-06 ENCOUNTER — Other Ambulatory Visit: Payer: Self-pay | Admitting: Family Medicine

## 2021-04-15 LAB — HM DIABETES EYE EXAM

## 2021-04-17 ENCOUNTER — Other Ambulatory Visit: Payer: Self-pay | Admitting: Family

## 2021-04-18 NOTE — Telephone Encounter (Signed)
Rx request sent to pharmacy.  

## 2021-04-23 NOTE — Assessment & Plan Note (Signed)
Eval with labs.  Possible GERD, gastritis, med intolerance/side effect ( chantix) vs PUD  Avoid acidic foods.. like tomato, spicy, citrus. Start Prilosec 40 mg daily to decrease acid.  If nausea not improving can consider holding chantix as it can cause nausea. Follow up with PCP in 2 weeks if not improving as expected.

## 2021-06-10 ENCOUNTER — Telehealth: Payer: Self-pay | Admitting: Family Medicine

## 2021-06-10 DIAGNOSIS — E119 Type 2 diabetes mellitus without complications: Secondary | ICD-10-CM

## 2021-06-10 NOTE — Telephone Encounter (Addendum)
Pt wife called in stating that she wants a glucose monitor to check pt daily. Pt wife wanted provider to send order in to get the full kit with strips. Pt spouse said to call her when it is out in at [phone]

## 2021-06-10 NOTE — Telephone Encounter (Signed)
Contacted pt's wife, Mitzi, and advised pt may be due for DM f/u. Pt reported he thought PCP said 6 month f/u, which would make next apt in Jan. Made apt for Jan and advised a msg would be sent to request glucose monitor. Mitzi verbalized understanding and was appreciative.

## 2021-06-10 NOTE — Telephone Encounter (Signed)
Mrs. Isaiah Rangel always does this for me.  Can you help?  I can probably do it, but I would need detailed instructions.

## 2021-06-11 MED ORDER — BLOOD GLUCOSE MONITOR KIT: PACK | 0 refills | Status: AC

## 2021-06-11 NOTE — Telephone Encounter (Signed)
I think that BID should be fine.  Thanks!

## 2021-06-11 NOTE — Telephone Encounter (Signed)
Sent in glucometer and supplies prescriptions.

## 2021-06-13 ENCOUNTER — Other Ambulatory Visit: Payer: Self-pay | Admitting: Cardiovascular Disease

## 2021-06-19 IMAGING — US US RENAL
1 series · 14 of 25 positions shown · non-contrast
Comparison: 06/15/2020 CT abdomen/pelvis

CLINICAL DATA: Follow-up left nephrolithiasis, left ureteral stent
placement 06/24/2020

EXAM:
RENAL / URINARY TRACT ULTRASOUND COMPLETE

[Series 1: us renal · 0.18mm/px · 14 of 81 slices shown]
[im 1/81]
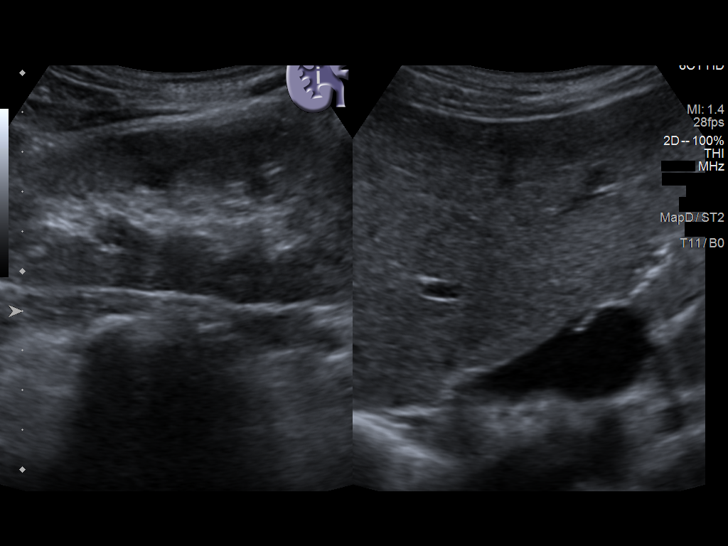
[im 7/81]
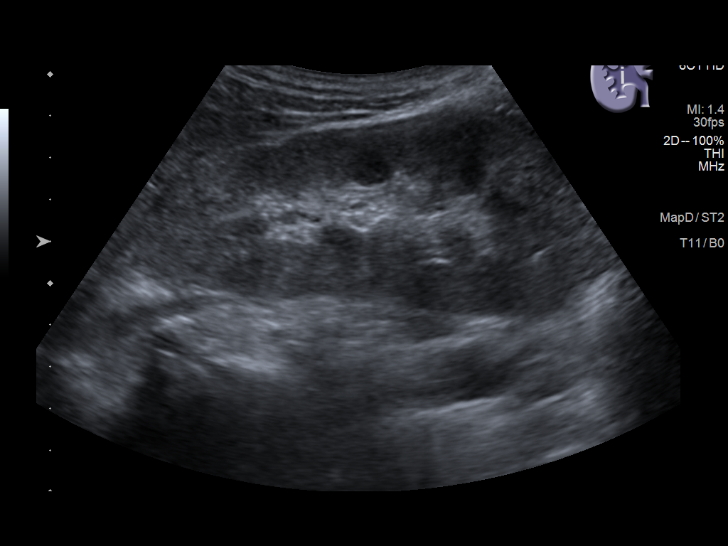
[im 14/81]
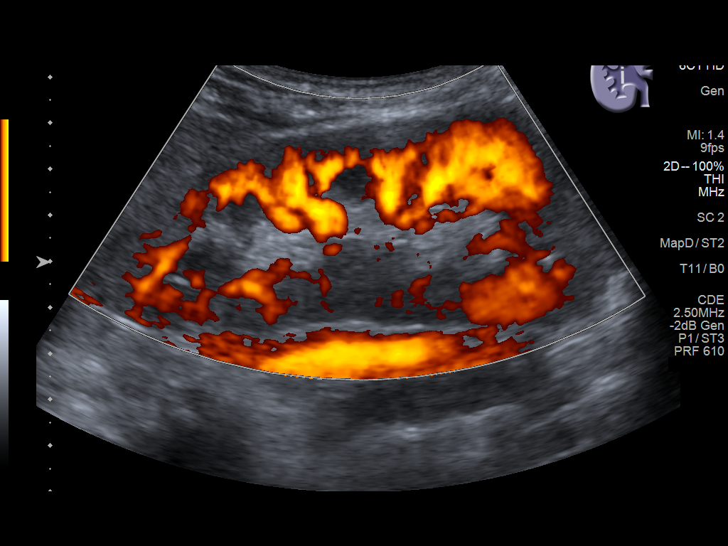
[im 21/81]
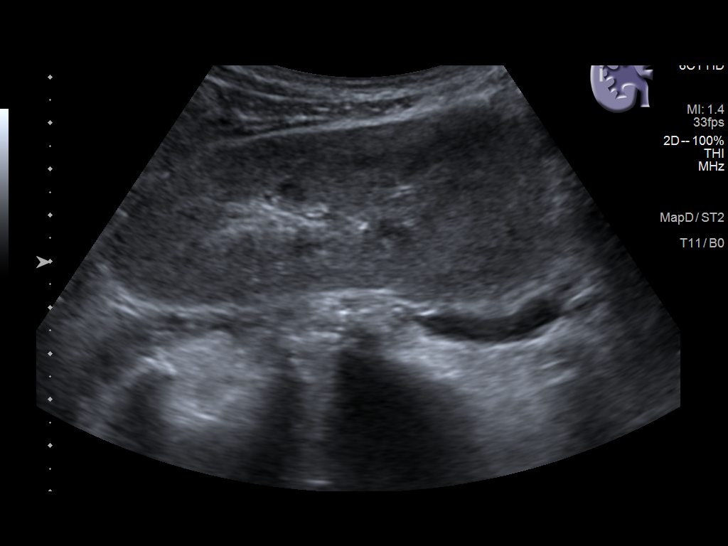
[im 27/81]
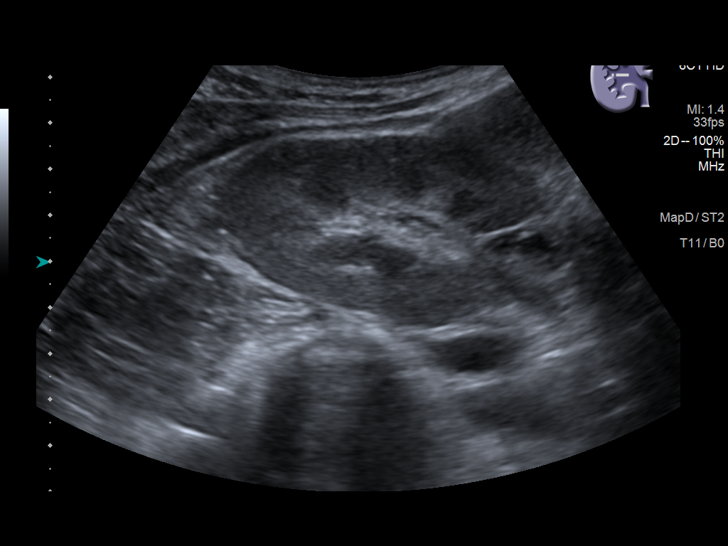
[im 31/81]
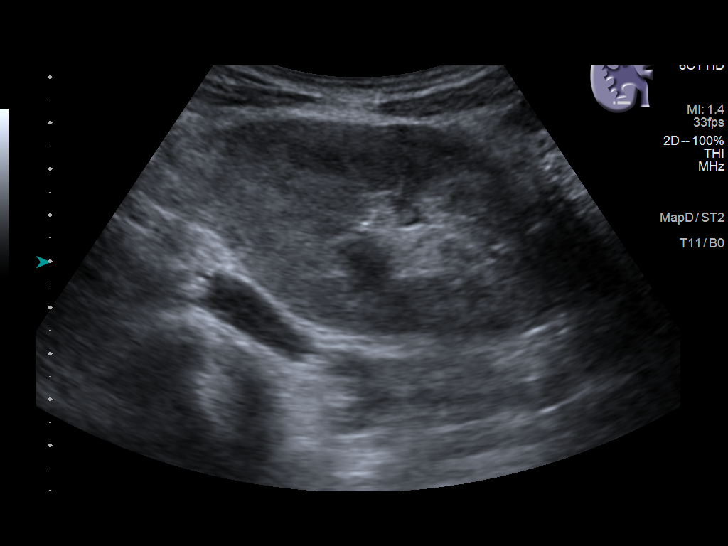
[im 37/81]
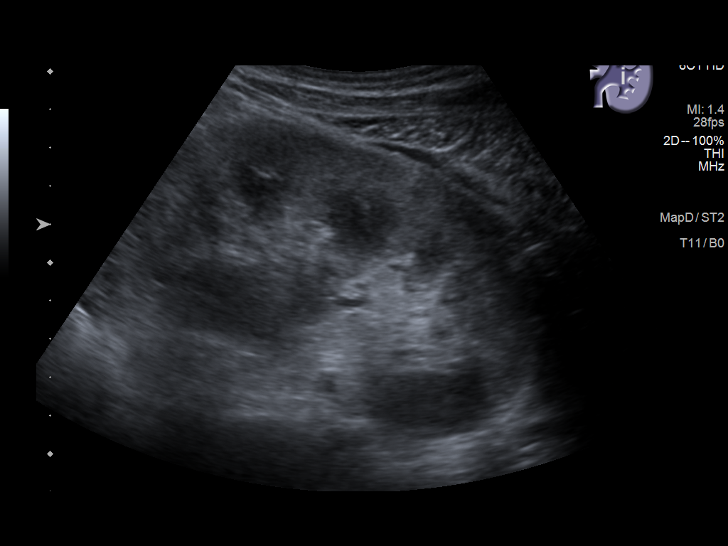
[im 44/81]
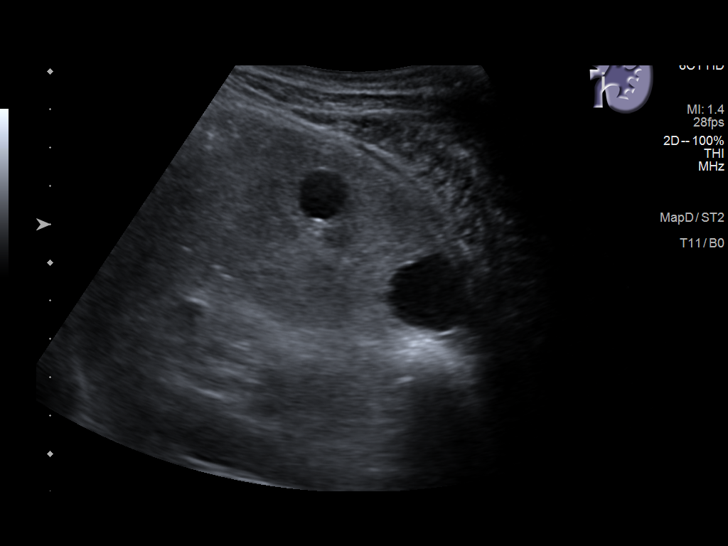
[im 51/81]
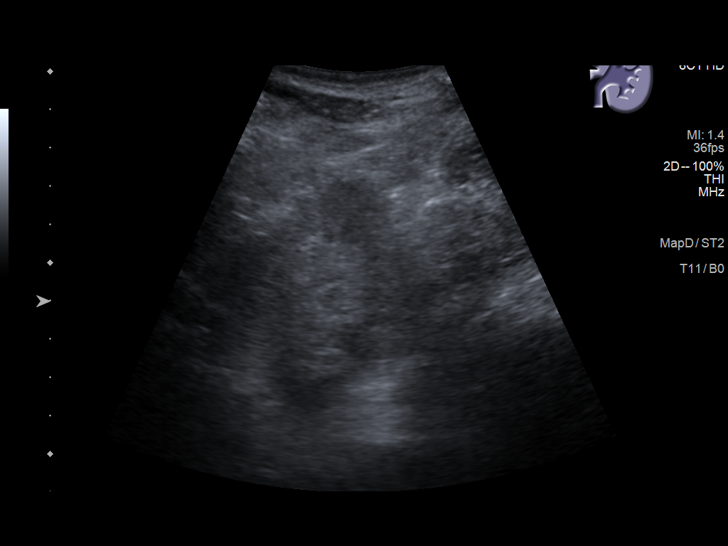
[im 54/81]
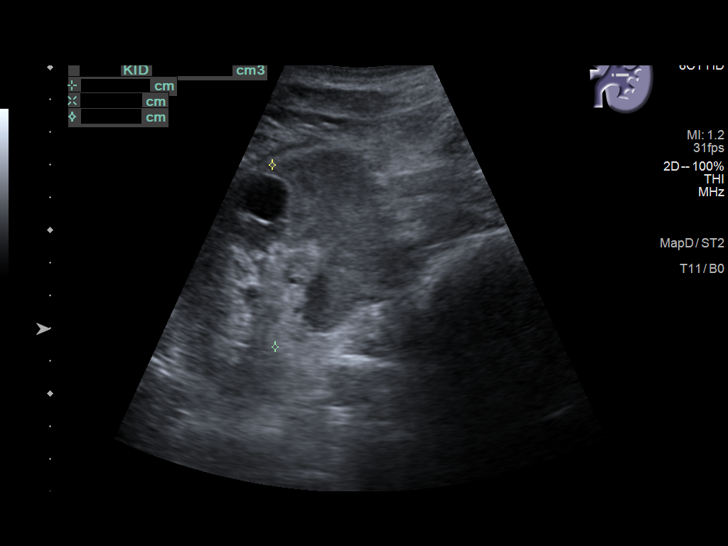
[im 61/81]
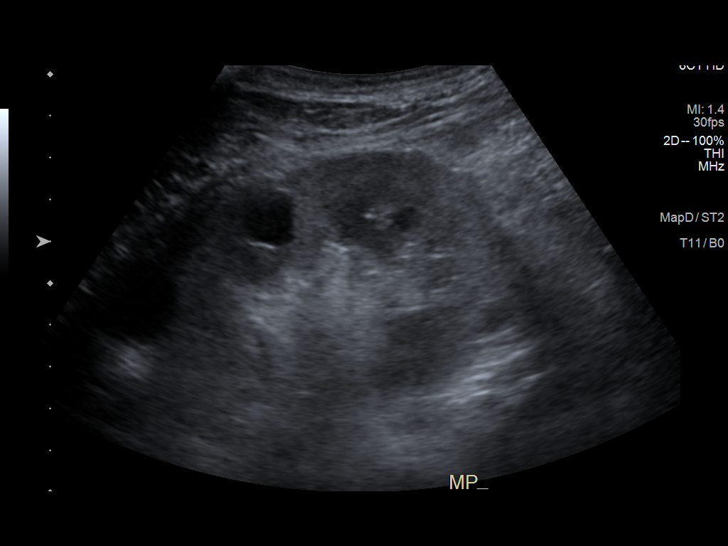
[im 67/81]
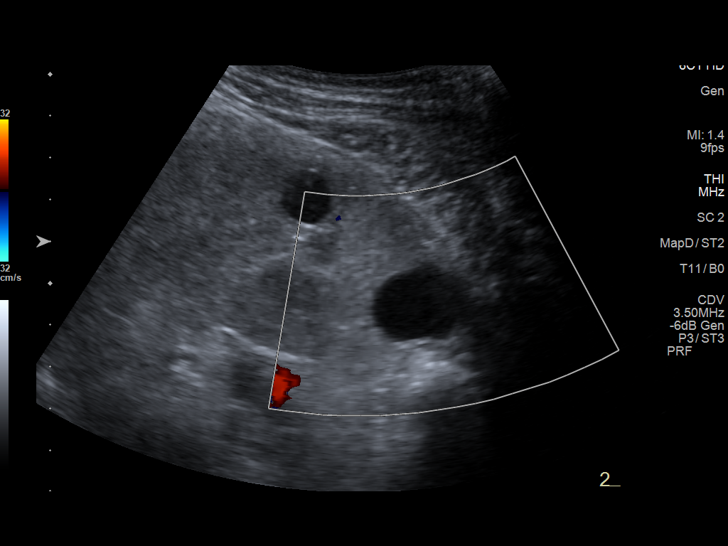
[im 74/81]
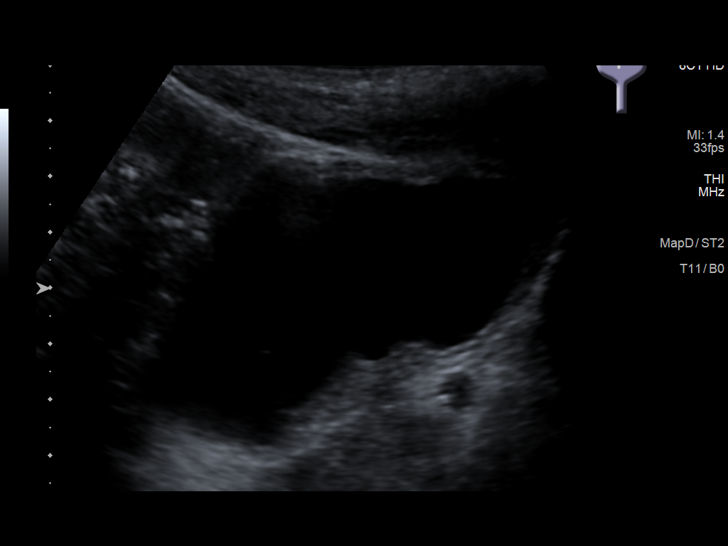
[im 81/81]
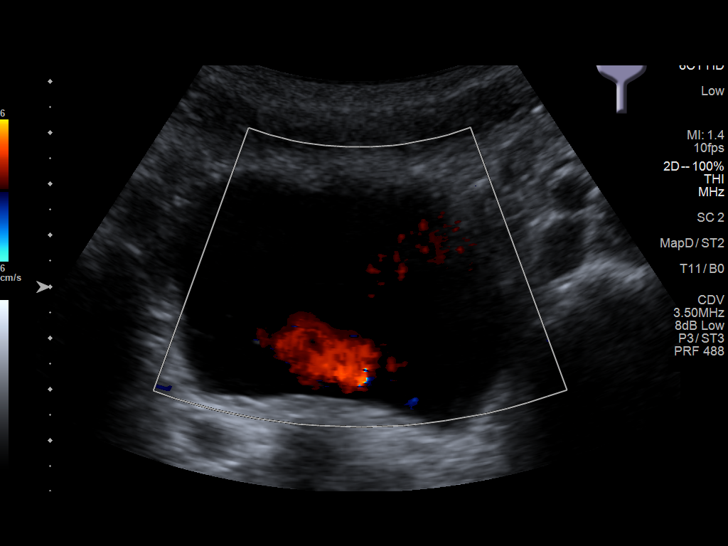

[14 of 25 positions shown; findings below may reference images not displayed]

FINDINGS: Right Kidney:

Renal measurements: 11.6 x 4.6 x 6.0 cm = volume: 167 mL.
Echogenicity within normal limits. No mass or hydronephrosis
visualized.

Left Kidney:

Renal measurements: 12.0 x 6.0 x 5.6 cm = volume: 208 mL.
Echogenicity within normal limits. No hydronephrosis. Simple 2.1 x
2.1 x 2.0 cm and 1.6 x 1.2 x 1.3 cm lower left renal cysts.

Bladder:

Appears normal for degree of bladder distention.

Other:

Ureteral jets demonstrated bilaterally in the bladder.
IMPRESSION: 1. No hydronephrosis.  Bilateral ureteral jets seen in the bladder.
2. Small simple lower left renal cysts.
3. Normal bladder.

## 2021-06-27 ENCOUNTER — Telehealth (INDEPENDENT_AMBULATORY_CARE_PROVIDER_SITE_OTHER): Payer: 59 | Admitting: Family Medicine

## 2021-06-27 ENCOUNTER — Encounter: Payer: Self-pay | Admitting: Family Medicine

## 2021-06-27 ENCOUNTER — Other Ambulatory Visit: Payer: Self-pay

## 2021-06-27 VITALS — Ht 65.0 in | Wt 135.0 lb

## 2021-06-27 DIAGNOSIS — J441 Chronic obstructive pulmonary disease with (acute) exacerbation: Secondary | ICD-10-CM

## 2021-06-27 MED ORDER — AZITHROMYCIN 250 MG PO TABS: ORAL_TABLET | ORAL | 0 refills | Status: DC

## 2021-06-27 MED ORDER — PREDNISONE 20 MG PO TABS: ORAL_TABLET | ORAL | 0 refills | Status: DC

## 2021-06-27 MED ORDER — BENZONATATE 200 MG PO CAPS: 200.0000 mg | ORAL_CAPSULE | Freq: Two times a day (BID) | ORAL | 0 refills | Status: DC | PRN

## 2021-06-27 NOTE — Progress Notes (Signed)
VIRTUAL VISIT Due to national recommendations of social distancing due to [place] 19, a virtual visit is felt to be most appropriate for this patient at this time.   I connected with the patient on 06/27/21 at  9:00 AM EDT by virtual telehealth platform and verified that I am speaking with the correct person using two identifiers.   I discussed the limitations, risks, security and privacy concerns of performing an evaluation and management service by  virtual telehealth platform and the availability of in person appointments. I also discussed with the patient that there may be a patient responsible charge related to this service. The patient expressed understanding and agreed to proceed.  Patient location: Home Provider Location: [facility] Participants: Eliezer Lofts and [name]   Chief Complaint  Patient presents with  . Cough  . Nasal Congestion         History of Present Illness: 60 year old male patient of Dr. Lillie Fragmin  with history of DM, COPD, CAD presents for new onset   Date of onset: 3 weeks ago   Started  with fatigue felt worn out.  Progressed to cough productive yellow mucus.  SOB worse as well. Increase wheeze.  No fever. No ear pain, no face p[ain , no ST.   Does not using Dulera... no SE just forgetting it.  Occ using Duoneb... now requiring 2 times a day.  He did start old prednisone  2 days ago.. started 8 mg daily... has helped some.   O2 sat 91% at rest.  Wife is a Therapist, sports... left upper wheeze, crackle left base on exam.    COVID 19 screen COVID testing: home test negative. 10/27 COVID vaccine:  01/12/2020 , 12/15/2019 COVID exposure: No recent travel or known exposure to [place]  The importance of social distancing was discussed today.    Review of Systems  Constitutional:  Negative for chills and fever.  HENT:  Positive for congestion. Negative for ear pain.   Eyes:  Negative for pain and redness.  Respiratory:  Positive for cough,  shortness of breath and wheezing.   Cardiovascular:  Negative for chest pain, palpitations and leg swelling.  Gastrointestinal:  Negative for abdominal pain, blood in stool, constipation, diarrhea, nausea and vomiting.  Genitourinary:  Negative for dysuria.  Musculoskeletal:  Negative for falls and myalgias.  Skin:  Negative for rash.  Neurological:  Negative for dizziness.  Psychiatric/Behavioral:  Negative for depression. The patient is not nervous/anxious.      Past Medical History:  Diagnosis Date  . BPH (benign prostatic hyperplasia)   . COPD (chronic obstructive pulmonary disease) ([place])   . Coronary artery disease    a. 03/2013 Cath: min irregs. EF >55%; b. 03/2019 Cath: LM nl, LAD nl, LCX nl, OM1 40, RCA 30p, 52m-->40% after IC NTG.  . Diabetes mellitus type 2, diet-controlled ([place]) 02/05/2021  . GAD (generalized anxiety disorder)   . GERD (gastroesophageal reflux disease)   . History of echocardiogram    a. 03/2019 Echo: EF 50-55%, nl RV size/fxn. Trace MR/TR.  Marland Kitchen History of kidney stones   . Hyperlipidemia   . Myocardial infarction ([place]) 03/2019   NO STENTS  . Skin cancer    basal and squamous  . Tobacco abuse     reports that he has been smoking cigarettes. He has a 40.00 pack-year smoking history. He has never used smokeless tobacco. He reports that he does not drink alcohol and does not use drugs.   Current Outpatient  Medications:  .  acetaminophen (TYLENOL) 500 MG tablet, Take 500-1,000 mg by mouth every 6 (six) hours as needed for moderate pain., Disp: , Rfl:  .  albuterol (VENTOLIN HFA) 108 (90 Base) MCG/ACT inhaler, Inhale 2 puffs into the lungs every 6 (six) hours as needed for wheezing or shortness of breath., Disp: , Rfl:  .  amLODipine (NORVASC) 2.5 MG tablet, TAKE 1 TABLET BY MOUTH  DAILY, Disp: 90 tablet, Rfl: 3 .  aspirin EC 81 MG tablet, Take 81 mg by mouth daily., Disp: , Rfl:  .  atorvastatin (LIPITOR) 80 MG tablet, TAKE 1 TABLET BY MOUTH  DAILY, Disp: 90  tablet, Rfl: 0 .  blood glucose meter kit and supplies KIT, Dispense based on patient and insurance preference. Use up to two times daily as directed., Disp: 1 each, Rfl: 0 .  escitalopram (LEXAPRO) 10 MG tablet, TAKE 1 TABLET BY MOUTH  DAILY, Disp: 90 tablet, Rfl: 3 .  ezetimibe (ZETIA) 10 MG tablet, Take 1 tablet (10 mg total) by mouth daily., Disp: 90 tablet, Rfl: 1 .  fludrocortisone (FLORINEF) 0.1 MG tablet, TAKE 2 TABLETS BY MOUTH  DAILY, Disp: 180 tablet, Rfl: 3 .  HYDROcodone-acetaminophen (NORCO/VICODIN) 5-325 MG tablet, Take 1 tablet by mouth every 6 (six) hours as needed for moderate pain., Disp: 10 tablet, Rfl: 0 .  ibuprofen (ADVIL) 200 MG tablet, Take 200-800 mg by mouth every 6 (six) hours as needed for moderate pain., Disp: , Rfl:  .  ipratropium-albuterol (DUONEB) 0.5-2.5 (3) MG/3ML SOLN, Take 3 mLs by nebulization every 6 (six) hours as needed. Take scheduled twice a day, Disp: 360 mL, Rfl: 3 .  isosorbide mononitrate (IMDUR) 30 MG 24 hr tablet, TAKE 1 TABLET BY MOUTH  DAILY, Disp: 90 tablet, Rfl: 1 .  methocarbamol (ROBAXIN) 750 MG tablet, Take 750 mg by mouth every 8 (eight) hours as needed for muscle spasms., Disp: , Rfl:  .  mometasone-formoterol (DULERA) 200-5 MCG/ACT AERO, Inhale 2 puffs into the lungs 2 (two) times daily., Disp: 1 each, Rfl: 6 .  Multiple Vitamin (MULTIVITAMIN WITH MINERALS) TABS tablet, Take 1 tablet by mouth daily., Disp: , Rfl:  .  nitroGLYCERIN (NITROSTAT) 0.4 MG SL tablet, DISSOLVE 1 TABLET UNDER THE TONGUE EVERY 5 MINUTES AS NEEDED FOR CHEST PAIN, Disp: 25 tablet, Rfl: 3 .  Omega-3 Fatty Acids (FISH OIL) 1200 MG CAPS, Take 1,200 mg by mouth daily., Disp: , Rfl:  .  omeprazole (PRILOSEC) 40 MG capsule, TAKE 1 CAPSULE(40 MG) BY MOUTH DAILY, Disp: 30 capsule, Rfl: 5 .  sulfamethoxazole-trimethoprim (BACTRIM DS) 800-160 MG tablet, Take 1 tablet by mouth every 12 (twelve) hours., Disp: 14 tablet, Rfl: 0 .  tamsulosin (FLOMAX) 0.4 MG CAPS capsule, Take 1  capsule (0.4 mg total) by mouth daily., Disp: 30 capsule, Rfl: 0 .  varenicline (CHANTIX STARTING MONTH PAK) 0.5 MG X 11 & 1 MG X 42 tablet, Take one 0.5mg  tablet by mouth once daily for 3 days, then increase to one 0.5mg  tablet twice daily for 3 days, then increase to one 1mg  tablet twice daily., Disp: 53 tablet, Rfl: 0 .  varenicline (CHANTIX) 1 MG tablet, Take 1 tablet (1 mg total) by mouth 2 (two) times daily., Disp: 60 tablet, Rfl: 3 .  ZINC-VITAMIN C PO, Take 1 tablet by mouth daily., Disp: , Rfl:    Observations/Objective: Height 5\' 5"  (1.651 m), weight 135 lb (61.2 kg).  Physical Exam  Physical Exam Constitutional:      General: The  patient is not in acute distress. Pulmonary:     Effort: Pulmonary effort is normal. No respiratory distress.  Neurological:     Mental Status: The patient is alert and oriented to person, place, and time.  Psychiatric:        Mood and Affect: Mood normal.        Behavior: Behavior normal.   Assessment and Plan    Problem List Items Addressed This Visit     COPD exacerbation ([place]) - Primary    Concerning for bacterial infection given sputum change. Treat with antibiotic.  Increase to true prednisone taper 60/40/20.  Continue Duoneb prn. Rest, fluids, benzonatate prn cough.   Encouraged pt to restart controller Dulera to decrease risk of COPD exacerbations and hospitalizations in the long run.  Pt agreeable.   ER and return precautions given. Follow O2Sat and have low threshold for in person exam.      Relevant Medications   azithromycin (ZITHROMAX) 250 MG tablet   benzonatate (TESSALON) 200 MG capsule   predniSONE (DELTASONE) 20 MG tablet    I discussed the assessment and treatment plan with the patient. The patient was provided an opportunity to ask questions and all were answered. The patient agreed with the plan and demonstrated an understanding of the instructions.   The patient was advised to call back or seek an in-person  evaluation if the symptoms worsen or if the condition fails to improve as anticipated.     Eliezer Lofts, MD

## 2021-06-27 NOTE — Assessment & Plan Note (Addendum)
Concerning for bacterial infection given sputum change. Treat with antibiotic.  Increase to true prednisone taper 60/40/20.  Continue Duoneb prn. Rest, fluids, benzonatate prn cough.   Encouraged pt to restart controller Dulera to decrease risk of COPD exacerbations and hospitalizations in the long run.  Pt agreeable.   ER and return precautions given. Follow O2Sat and have low threshold for in person exam.

## 2021-07-07 ENCOUNTER — Telehealth: Payer: Self-pay | Admitting: Family Medicine

## 2021-07-07 NOTE — Telephone Encounter (Signed)
Help  This needs to be triaged, and if he is getting worse, then he needs an in-office face-to-face visit.  I can't order a chest x-ray (or anyone) without an in-person assessment.  I think that they will get that.  I am sure one of my partners would be happy to see him tomorrow.

## 2021-07-07 NOTE — Telephone Encounter (Signed)
Pt wife called stating that pt would like an order for a chest Xray.

## 2021-07-08 ENCOUNTER — Ambulatory Visit: Payer: 59 | Admitting: Family Medicine

## 2021-07-08 ENCOUNTER — Ambulatory Visit (INDEPENDENT_AMBULATORY_CARE_PROVIDER_SITE_OTHER): Payer: 59

## 2021-07-08 ENCOUNTER — Other Ambulatory Visit: Payer: Self-pay

## 2021-07-08 VITALS — BP 120/80 | HR 74 | Temp 97.0°F | Ht 65.0 in | Wt 130.1 lb

## 2021-07-08 DIAGNOSIS — F17200 Nicotine dependence, unspecified, uncomplicated: Secondary | ICD-10-CM | POA: Diagnosis not present

## 2021-07-08 DIAGNOSIS — J441 Chronic obstructive pulmonary disease with (acute) exacerbation: Secondary | ICD-10-CM

## 2021-07-08 MED ORDER — DULERA 200-5 MCG/ACT IN AERO: 2.0000 | INHALATION_SPRAY | Freq: Two times a day (BID) | RESPIRATORY_TRACT | 1 refills | Status: DC

## 2021-07-08 MED ORDER — DOXYCYCLINE HYCLATE 100 MG PO TABS: 100.0000 mg | ORAL_TABLET | Freq: Two times a day (BID) | ORAL | 0 refills | Status: AC

## 2021-07-08 NOTE — Assessment & Plan Note (Signed)
At this time not interested in quitting smoking - though aware this is what he can do to help his breathing. Notes adverse reaction to chantix.

## 2021-07-08 NOTE — Telephone Encounter (Signed)
Noted will see today. Anticipate CXR.

## 2021-07-08 NOTE — Assessment & Plan Note (Addendum)
Pt notes some symptoms improved but others worse since treatment on 10/28. CXR w/o sign of pneumonia on my read (will f/u final) but reasonable to try doxycycline given some azithromycin resistance. No wheezes and generally breathing comfortably so do not feel additional steroids are necessary. Advised nebulizer 2-3 times a day for 2 days and then prn. Cont Dulera. Discussed that if symptoms persisting and not improving he may need to consider additional COPD controller medication or pulm referral - he will f/u with PCP regarding this.

## 2021-07-08 NOTE — Patient Instructions (Addendum)
#  COPD - Start Doxycycline - Do nebulizer 2-3 times daily for the next 2 days - then return to as needed - continue Dulera  Chest X-ray without sign of pneumonia  If symptoms do not improve would recommend discussing pulmonology referral with Dr. Lorelei Pont

## 2021-07-08 NOTE — Telephone Encounter (Addendum)
I spoke with Isaiah Rangel;(DPR signed) cough has worsened; chest hurts when coughs,has prod cough with not sure color of phlegm; no fever. Isaiah Rangel said cough sounds different but hard to describe.pt had neg covid test on 06/26/21. scheduled in office appt with Dr Einar Pheasant on 07/08/21 at 2:20 with Dr Einar Pheasant. UC & ED precautions given and Isaiah Rangel voiced understanding. Sending note to Dr Einar Pheasant, Dr Lorelei Pont as Juluis Rainier and [place] CMA.

## 2021-07-08 NOTE — Progress Notes (Signed)
Subjective:     Isaiah Rangel is a 60 y.o. male presenting for Cough (X 4 weeks. Had a round of ABO )     Cough   #Cough - x 4 weeks - the abx did decrease the cough some - but still present - will having coughing fits for 30-60 seconds - causing chest pain - scratchy throat for the last month - was also SOB during that time  - has been having CP - took a nitro w/o improvement - voice has been weak - has also lost some weight  Has been using the Dulera twice daily since his last appointment Also using the nebulizer - at nighttime and then on weekends will take twice daily - in the past would never need this  Using the nebulizer more over the last month  Mucus is less since the abx Does have runny nose all the time   Review of Systems  Respiratory:  Positive for cough.    06/27/2021: Clinic - COPD exacerbation - azithromycin, tessalon, prednisone  Social History   Tobacco Use  Smoking Status Every Day   Packs/day: 1.00   Years: 40.00   Pack years: 40.00   Types: Cigarettes  Smokeless Tobacco Never  Tobacco Comments   Half pack daily-03/24/21        Objective:    BP Readings from Last 3 Encounters:  07/08/21 120/80  02/27/21 104/70  02/05/21 134/86   Wt Readings from Last 3 Encounters:  07/08/21 130 lb 2 oz (59 kg)  06/27/21 135 lb (61.2 kg)  03/24/21 135 lb (61.2 kg)    BP 120/80   Pulse 74   Temp (!) 97 F (36.1 C) (Temporal)   Ht 5\' 5"  (1.651 m)   Wt 130 lb 2 oz (59 kg)   SpO2 95%   BMI 21.65 kg/m    Physical Exam Constitutional:      General: He is not in acute distress.    Appearance: He is well-developed. He is not ill-appearing.  HENT:     Head: Normocephalic and atraumatic.     Right Ear: Tympanic membrane and ear canal normal.     Left Ear: Tympanic membrane and ear canal normal.     Nose: Mucosal edema and rhinorrhea present.     Right Sinus: No maxillary sinus tenderness or frontal sinus tenderness.     Left Sinus: No  maxillary sinus tenderness or frontal sinus tenderness.     Mouth/Throat:     Pharynx: Uvula midline. No oropharyngeal exudate or posterior oropharyngeal erythema.     Tonsils: 0 on the right. 0 on the left.  Cardiovascular:     Rate and Rhythm: Normal rate and regular rhythm.     Heart sounds: Murmur heard.  Pulmonary:     Effort: Pulmonary effort is normal. No respiratory distress.     Breath sounds: Normal breath sounds. No wheezing, rhonchi or rales.  Musculoskeletal:     Cervical back: Neck supple.  Lymphadenopathy:     Cervical: No cervical adenopathy.  Skin:    General: Skin is warm and dry.     Capillary Refill: Capillary refill takes less than 2 seconds.  Neurological:     Mental Status: He is alert.    CXR (my read): airway thickening, hyperinflation, no consolidation or effusion      Assessment & Plan:   Problem List Items Addressed This Visit       Respiratory   COPD exacerbation ([place]) - Primary  Pt notes some symptoms improved but others worse since treatment on 10/28. CXR w/o sign of pneumonia on my read (will f/u final) but reasonable to try doxycycline given some azithromycin resistance. No wheezes and generally breathing comfortably so do not feel additional steroids are necessary. Advised nebulizer 2-3 times a day for 2 days and then prn. Cont Dulera. Discussed that if symptoms persisting and not improving he may need to consider additional COPD controller medication or pulm referral - he will f/u with PCP regarding this.       Relevant Medications   mometasone-formoterol (DULERA) 200-5 MCG/ACT AERO   Other Relevant Orders   DG Chest 2 View     Other   Smoking (Chronic)    At this time not interested in quitting smoking - though aware this is what he can do to help his breathing. Notes adverse reaction to chantix.         Return if symptoms worsen or fail to improve.  Lesleigh Noe, MD  This visit occurred during the SARS-CoV-2 public health  emergency.  Safety protocols were in place, including screening questions prior to the visit, additional usage of staff PPE, and extensive cleaning of exam room while observing appropriate contact time as indicated for disinfecting solutions.

## 2021-07-11 ENCOUNTER — Other Ambulatory Visit: Payer: Self-pay | Admitting: Family

## 2021-07-11 DIAGNOSIS — I951 Orthostatic hypotension: Secondary | ICD-10-CM

## 2021-07-14 NOTE — Telephone Encounter (Signed)
Rx(s) sent to pharmacy electronically.  

## 2021-07-16 ENCOUNTER — Other Ambulatory Visit: Payer: Self-pay | Admitting: Family

## 2021-07-17 NOTE — Telephone Encounter (Signed)
Rx(s) sent to pharmacy electronically.  

## 2021-07-23 ENCOUNTER — Ambulatory Visit: Payer: Self-pay | Admitting: Urology

## 2021-08-12 ENCOUNTER — Telehealth: Payer: Self-pay | Admitting: Urology

## 2021-08-12 NOTE — Telephone Encounter (Signed)
Please have Mr. Isaiah Rangel reschedule his missed appointment with Dr. Erlene Quan in November for his x-ray to monitor his kidney stones and also to recheck his urine to make sure the microscopic blood that was present and that over the summer has cleared.  It is important that we monitor his urine to make sure the microscopic blood does not persist as he is at a higher risk for kidney and bladder cancer due to his smoking.

## 2021-08-12 NOTE — Telephone Encounter (Signed)
I called the pt to reschedule and left him a voicemail to call the office to get scheduled and I sent him a message thru [place] as well.

## 2021-08-13 NOTE — Telephone Encounter (Signed)
Left pt another vm to call the office to reschedule missed appt.

## 2021-08-21 ENCOUNTER — Ambulatory Visit: Payer: 59 | Admitting: Urology

## 2021-08-29 ENCOUNTER — Other Ambulatory Visit: Payer: Self-pay | Admitting: Cardiovascular Disease

## 2021-08-29 NOTE — Telephone Encounter (Signed)
Please schedule 12 month F/U appointment. Thank you! 

## 2021-08-29 NOTE — Telephone Encounter (Signed)
LVM to schedule appt

## 2021-09-14 ENCOUNTER — Other Ambulatory Visit: Payer: Self-pay | Admitting: Cardiovascular Disease

## 2021-09-14 NOTE — Progress Notes (Signed)
Taffany Heiser T. Dann Ventress, MD, CAQ Sports Medicine [facility] [address] [place], [address]  Phone: [phone]   FAX: [phone]  Isaiah Rangel - 61 y.o. male   MRN [id number]   Date of Birth: 03-29-1961  Date: 09/15/2021   PCP: Hannah Beat, MD   Referral: Hannah Beat, MD  Chief Complaint  Patient presents with   Diabetes   Neck Pain         This visit occurred during the SARS-CoV-2 public health emergency.  Safety protocols were in place, including screening questions prior to the visit, additional usage of staff PPE, and extensive cleaning of exam room while observing appropriate contact time as indicated for disinfecting solutions.   Subjective:   Isaiah Rangel is a 61 y.o. very pleasant male patient with Body mass index is 22.53 kg/m. who presents with the following:  CAD with DM f/u:   Diabetes Mellitus: Tolerating Medications: yes Compliance with diet: fair, Body mass index is 22.53 kg/m. Exercise: minimal / intermittent Avg blood sugars at home: not checking Foot problems: none Hypoglycemia: none No nausea, vomitting, blurred vision, polyuria.  Lab Results  Component Value Date   HGBA1C 6.2 (A) 09/15/2021   HGBA1C 6.6 (H) 01/29/2021   HGBA1C 6.3 10/23/2020   Lab Results  Component Value Date   LDLCALC 43 01/29/2021   CREATININE 1.04 02/27/2021    Wt Readings from Last 3 Encounters:  09/15/21 135 lb 6 oz (61.4 kg)  07/08/21 130 lb 2 oz (59 kg)  06/27/21 135 lb (61.2 kg)    Lipids: Doing well, stable. Tolerating meds fine with no SE. Panel reviewed with patient.  Lipids: Lab Results  Component Value Date   CHOL 95 01/29/2021   Lab Results  Component Value Date   HDL 41.20 01/29/2021   Lab Results  Component Value Date   LDLCALC 43 01/29/2021   Lab Results  Component Value Date   TRIG 52.0 01/29/2021   Lab Results  Component Value Date   CHOLHDL 2 01/29/2021    Lab Results   Component Value Date   ALT 26 02/27/2021   AST 21 02/27/2021   ALKPHOS 75 02/27/2021   BILITOT 0.6 02/27/2021    Health Maintenance  Topic Date Due   FOOT EXAM  Never done   OPHTHALMOLOGY EXAM  Never done   URINE MICROALBUMIN  Never done   COVID-19 Vaccine (3 - Moderna risk series) 02/09/2020   Pneumococcal Vaccine 77-73 Years old (2 - PCV) 10/08/2020   Zoster Vaccines- Shingrix (2 of 2) 04/02/2021   INFLUENZA VACCINE  11/28/2021 (Originally 03/31/2021)   HEMOGLOBIN A1C  03/15/2022   COLONOSCOPY (Pts 45-36yrs Insurance coverage will need to be confirmed)  12/13/2023   TETANUS/TDAP  06/22/2026   Hepatitis C Screening  Completed   HIV Screening  Completed   HPV VACCINES  Aged Out    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  He does have a history of prior neck surgery.  This was a distant fusion roughly 20 years ago.   Eye Foot Covid? Bivalent booster Prevnar-20? Shingrix #2  Immunization History  Administered Date(s) Administered   Influenza Inj Mdck Quad Pf 10/09/2019   Influenza Whole 07/09/2010   Influenza, Seasonal, Injecte, Preservative Fre 06/18/2015   Influenza,inj,Quad PF,6+ Mos 06/22/2016, 06/13/2020   Moderna Sars-Covid-2 Vaccination 12/15/2019, 01/12/2020   Pneumococcal Polysaccharide-23 10/09/2019   Tdap 06/22/2016   Zoster Recombinat (Shingrix) 02/05/2021     Review of Systems is noted in  the HPI, as appropriate  Objective:   BP 122/70    Pulse 69    Temp 98.3 F (36.8 C) (Temporal)    Ht $R'5\' 5"'Se$  (1.651 m)    Wt 135 lb 6 oz (61.4 kg)    SpO2 95%    BMI 22.53 kg/m   GEN: No acute distress; alert,appropriate. PULM: Breathing comfortably in no respiratory distress PSYCH: Normally interactive.  CV: RRR, no m/g/r  Neck: Roughly 20% loss of motion in all directions, worse with lateral movement.  Otherwise neurovascularly intact  Laboratory and Imaging Data: Results for orders placed or performed in visit on 09/15/21  POCT glycosylated hemoglobin (Hb A1C)  Result  Value Ref Range   Hemoglobin A1C 6.2 (A) 4.0 - 5.6 %   HbA1c POC (<> result, manual entry)     HbA1c, POC (prediabetic range)     HbA1c, POC (controlled diabetic range)       Assessment and Plan:     ICD-10-CM   1. Diabetes mellitus type 2, diet-controlled (HCC)  E11.9 POCT glycosylated hemoglobin (Hb A1C)    2. Coronary artery disease involving native coronary artery of native heart without angina pectoris  I25.10     3. Chronic neck pain  M54.2 DG Cervical Spine Complete   G89.29     4. History of neck surgery  Z98.890 DG Cervical Spine Complete     Diabetes is stable. History of cervical fusion distantly with inducible Spurling's as well as exacerbation of neck.  Acute on chronic neck pain with exacerbation.  Check plain film.  Continue with basic motion at the neck.  Shingrix No. 2 today  Social: Neck is limiting him somewhat from maximal function at work in terms of efficiency.  Orders Placed This Encounter  Procedures   DG Cervical Spine Complete   POCT glycosylated hemoglobin (Hb A1C)    Follow-up: Return in about 6 months (around 03/15/2022) for Complete physical exam.  Dragon [employer] One speech-to-text software was used for transcription in this dictation.  Possible transcriptional errors can occur using Editor, commissioning.   Signed,  Maud Deed. Yatzari Jonsson, MD   Outpatient Encounter Medications as of 09/15/2021  Medication Sig   acetaminophen (TYLENOL) 500 MG tablet Take 500-1,000 mg by mouth every 6 (six) hours as needed for moderate pain.   albuterol (VENTOLIN HFA) 108 (90 Base) MCG/ACT inhaler Inhale 2 puffs into the lungs every 6 (six) hours as needed for wheezing or shortness of breath.   amLODipine (NORVASC) 2.5 MG tablet Take 1 tablet (2.5 mg total) by mouth daily. PLEASE SCHEDULE OFFICE VISIT FOR FURTHER REFILLS. THANK YOU!   aspirin EC 81 MG tablet Take 81 mg by mouth daily.   atorvastatin (LIPITOR) 80 MG tablet TAKE 1 TABLET BY MOUTH  DAILY    benzonatate (TESSALON) 200 MG capsule Take 1 capsule (200 mg total) by mouth 2 (two) times daily as needed for cough.   blood glucose meter kit and supplies KIT Dispense based on patient and insurance preference. Use up to two times daily as directed.   escitalopram (LEXAPRO) 10 MG tablet TAKE 1 TABLET BY MOUTH  DAILY   ezetimibe (ZETIA) 10 MG tablet Take 1 tablet (10 mg total) by mouth daily.   fludrocortisone (FLORINEF) 0.1 MG tablet TAKE 2 TABLETS BY MOUTH  DAILY   ibuprofen (ADVIL) 200 MG tablet Take 200-800 mg by mouth every 6 (six) hours as needed for moderate pain.   ipratropium-albuterol (DUONEB) 0.5-2.5 (3) MG/3ML SOLN Take 3 mLs by  nebulization every 6 (six) hours as needed. Take scheduled twice a day   isosorbide mononitrate (IMDUR) 30 MG 24 hr tablet TAKE 1 TABLET BY MOUTH  DAILY   methocarbamol (ROBAXIN) 750 MG tablet Take 750 mg by mouth every 8 (eight) hours as needed for muscle spasms.   mometasone-formoterol (DULERA) 200-5 MCG/ACT AERO Inhale 2 puffs into the lungs 2 (two) times daily.   Multiple Vitamin (MULTIVITAMIN WITH MINERALS) TABS tablet Take 1 tablet by mouth daily.   nitroGLYCERIN (NITROSTAT) 0.4 MG SL tablet DISSOLVE 1 TABLET UNDER THE TONGUE EVERY 5 MINUTES AS NEEDED FOR CHEST PAIN   Omega-3 Fatty Acids (FISH OIL) 1200 MG CAPS Take 1,200 mg by mouth daily.   No facility-administered encounter medications on file as of 09/15/2021.

## 2021-09-15 ENCOUNTER — Ambulatory Visit (INDEPENDENT_AMBULATORY_CARE_PROVIDER_SITE_OTHER): Payer: 59 | Admitting: Family Medicine

## 2021-09-15 ENCOUNTER — Other Ambulatory Visit: Payer: Self-pay

## 2021-09-15 ENCOUNTER — Ambulatory Visit (INDEPENDENT_AMBULATORY_CARE_PROVIDER_SITE_OTHER)
Admission: RE | Admit: 2021-09-15 | Discharge: 2021-09-15 | Disposition: A | Discharge status: Home / Self Care | Payer: 59 | Source: Ambulatory Visit | Attending: Family Medicine | Admitting: Family Medicine

## 2021-09-15 ENCOUNTER — Encounter: Payer: Self-pay | Admitting: Family Medicine

## 2021-09-15 VITALS — BP 122/70 | HR 69 | Temp 98.3°F | Ht 65.0 in | Wt 135.4 lb

## 2021-09-15 DIAGNOSIS — Z9889 Other specified postprocedural states: Secondary | ICD-10-CM | POA: Diagnosis not present

## 2021-09-15 DIAGNOSIS — E119 Type 2 diabetes mellitus without complications: Secondary | ICD-10-CM | POA: Diagnosis not present

## 2021-09-15 DIAGNOSIS — M542 Cervicalgia: Secondary | ICD-10-CM

## 2021-09-15 DIAGNOSIS — G8929 Other chronic pain: Secondary | ICD-10-CM

## 2021-09-15 DIAGNOSIS — Z23 Encounter for immunization: Secondary | ICD-10-CM | POA: Diagnosis not present

## 2021-09-15 DIAGNOSIS — I2510 Atherosclerotic heart disease of native coronary artery without angina pectoris: Secondary | ICD-10-CM | POA: Diagnosis not present

## 2021-09-15 DIAGNOSIS — E782 Mixed hyperlipidemia: Secondary | ICD-10-CM

## 2021-09-15 LAB — POCT GLYCOSYLATED HEMOGLOBIN (HGB A1C): Hemoglobin A1C: 6.2 % — AB (ref 4.0–5.6)

## 2021-09-15 NOTE — Addendum Note (Signed)
Addended by: Carter Kitten on: 09/15/2021 08:42 AM   Modules accepted: Orders

## 2021-09-16 NOTE — Telephone Encounter (Signed)
Scheduled to see Dr. Erlene Quan on 2/8.

## 2021-09-17 ENCOUNTER — Other Ambulatory Visit: Payer: Self-pay

## 2021-09-17 ENCOUNTER — Ambulatory Visit (INDEPENDENT_AMBULATORY_CARE_PROVIDER_SITE_OTHER): Payer: 59 | Admitting: Nurse Practitioner

## 2021-09-17 ENCOUNTER — Encounter: Payer: Self-pay | Admitting: Nurse Practitioner

## 2021-09-17 VITALS — BP 114/60 | HR 74 | Ht 65.0 in | Wt 133.0 lb

## 2021-09-17 DIAGNOSIS — I2510 Atherosclerotic heart disease of native coronary artery without angina pectoris: Secondary | ICD-10-CM | POA: Diagnosis not present

## 2021-09-17 DIAGNOSIS — Z720 Tobacco use: Secondary | ICD-10-CM

## 2021-09-17 DIAGNOSIS — I951 Orthostatic hypotension: Secondary | ICD-10-CM | POA: Diagnosis not present

## 2021-09-17 DIAGNOSIS — I201 Angina pectoris with documented spasm: Secondary | ICD-10-CM

## 2021-09-17 DIAGNOSIS — E785 Hyperlipidemia, unspecified: Secondary | ICD-10-CM

## 2021-09-17 MED ORDER — ATORVASTATIN CALCIUM 80 MG PO TABS: 80.0000 mg | ORAL_TABLET | Freq: Every day | ORAL | 0 refills | Status: DC

## 2021-09-17 MED ORDER — FLUDROCORTISONE ACETATE 0.1 MG PO TABS: 200.0000 ug | ORAL_TABLET | Freq: Every day | ORAL | 0 refills | Status: DC

## 2021-09-17 MED ORDER — AMLODIPINE BESYLATE 2.5 MG PO TABS: 2.5000 mg | ORAL_TABLET | Freq: Every day | ORAL | 0 refills | Status: DC

## 2021-09-17 MED ORDER — ISOSORBIDE MONONITRATE ER 30 MG PO TB24: 30.0000 mg | ORAL_TABLET | Freq: Every day | ORAL | 0 refills | Status: DC

## 2021-09-17 MED ORDER — EZETIMIBE 10 MG PO TABS: 10.0000 mg | ORAL_TABLET | Freq: Every day | ORAL | 0 refills | Status: DC

## 2021-09-17 NOTE — Patient Instructions (Signed)
Medication Instructions:  Your physician recommends that you continue on your current medications as directed. Please refer to the Current Medication list given to you today.  *If you need a refill on your cardiac medications before your next appointment, please call your pharmacy*   Lab Work: None ordered  If you have labs (blood work) drawn today and your tests are completely normal, you will receive your results only by: [place] (if you have MyChart) OR A paper copy in the mail If you have any lab test that is abnormal or we need to change your treatment, we will call you to review the results.   Testing/Procedures: None ordered   Follow-Up: At [facility], you and your health needs are our priority.  As part of our continuing mission to provide you with exceptional heart care, we have created designated Provider Care Teams.  These Care Teams include your primary Cardiologist (physician) and Advanced Practice Providers (APPs -  Physician Assistants and Nurse Practitioners) who all work together to provide you with the care you need, when you need it.  We recommend signing up for the patient portal called "MyChart".  Sign up information is provided on this After Visit Summary.  MyChart is used to connect with patients for Virtual Visits (Telemedicine).  Patients are able to view lab/test results, encounter notes, upcoming appointments, etc.  Non-urgent messages can be sent to your provider as well.   To learn more about what you can do with MyChart, go to [email or web address].    Your next appointment:    Your physician wants you to follow-up in: 1 year.   You will receive a reminder letter in the mail two months in advance. If you don't receive a letter, please call our office to schedule the follow-up appointment.   The format for your next appointment:   In Person  Provider:   You may see Ida Rogue, MD or one of the following Advanced Practice Providers on  your designated Care Team:   Murray Hodgkins, NP Christell Faith, PA-C Cadence Kathlen Mody, PA-C  :1}    Other Instructions N/A

## 2021-09-17 NOTE — Progress Notes (Signed)
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  Past Medical History    Past Medical History:  Diagnosis Date   BPH (benign prostatic hyperplasia)    COPD (chronic obstructive pulmonary disease) (HCC)    Coronary artery disease/Coronary Vasospasm    a. 03/2013 Cath: min irregs. EF >55%; b. 03/2019 Cath: LM nl, LAD nl, LCX nl, OM1 40, RCA 30p, 87m-->40% after IC NTG; c. 12/2019 MV: EF 65%, no ischemia/infarct-->low risk.   Diabetes mellitus type 2, diet-controlled ([place]) 30/12/1100   Diastolic dysfunction    a. 03/2019 Echo: EF 50-55%, nl RV size/fxn. Trace MR/TR; b. 09/2019 Echo: EF 60-65%, mild LVH, GrI DD, no rwma, triv MR, mild AoV sclerosis w/o stenosis.   GAD (generalized anxiety disorder)    GERD (gastroesophageal reflux disease)    History of kidney stones    Hyperlipidemia    Myocardial infarction ([place]) 03/2019   NO STENTS   Skin cancer    basal and squamous   Tobacco abuse    Past Surgical History:  Procedure Laterality Date   BACK SURGERY  1998   NO METAL   CARDIAC CATHETERIZATION  04/06/13   [facility]- minor luminal irregularities, otherwise normal cors; EF > 55%. Medical management and smoking cessation recommended   CATARACT EXTRACTION W/PHACO Right 08/28/2020   Procedure: CATARACT EXTRACTION PHACO AND INTRAOCULAR LENS PLACEMENT ([place]) RIGHT Tyler Deis;  Surgeon: Leandrew Koyanagi, MD;  Location: [place];  Service: Ophthalmology;  Laterality: Right;  1.77 0:31.9 5.6%   CYSTOSCOPY/URETEROSCOPY/HOLMIUM LASER/STENT  PLACEMENT Left 06/13/2020   Procedure: CYSTOSCOPY/URETEROSCOPY//STENT PLACEMENT;  Surgeon: Hollice Espy, MD;  Location: [facility];  Service: Urology;  Laterality: Left;   CYSTOSCOPY/URETEROSCOPY/HOLMIUM LASER/STENT PLACEMENT Left 06/24/2020   Procedure: CYSTOSCOPY/URETEROSCOPY/HOLMIUM LASER/STENT PLACEMENT;  Surgeon: Hollice Espy, MD;  Location: [facility];  Service: Urology;  Laterality: Left;   KNEE ARTHROSCOPY W/ PARTIAL MEDIAL MENISCECTOMY Right 2012   [place], 2012   KNEE ARTHROSCOPY WITH MEDIAL MENISECTOMY Left 11/13/2020   Procedure: Left knee arthroscopy with partial medial or lateral menisectomy, possible chondroplasty, possible partial synovectomy;  Surgeon: Lovell Sheehan, MD;  Location: [facility];  Service: Orthopedics;  Laterality: Left;   LEFT HEART CATH AND CORONARY ANGIOGRAPHY N/A 03/20/2019   Procedure: LEFT HEART CATH AND CORONARY ANGIOGRAPHY;  Surgeon: Troy Sine, MD;  Location: [place] [facility];  Service: Cardiovascular;  Laterality: N/A;   NASAL SINUS SURGERY     neck fusion  2000   TONSILLECTOMY     VASECTOMY      Allergies  Allergies  Allergen Reactions   Other Nausea And Vomiting and Other (See Comments)    Quail eggs caused severe stomach pain and n/v    History of Present Illness    61 year old male with the above past medical history including nonobstructive CAD, coronary vasospasm, chronic intermittent chest pain, hyperlipidemia, orthostatic hypotension requiring Florinef therapy, diastolic dysfunction, bipolar disorder, GERD, COPD, tobacco abuse, and family history of premature CAD.  In August 2014, he underwent cardiac catheterization in the setting of chest pain, despite normal stress testing.  This showed minor irregularities only.  They continue to have intermittent chest discomfort multiple times a week and was medically managed with long-acting nitrates.  In July 2020, he had sudden onset of chest pain and was admitted to [facility] with a non-STEMI  (peak high-sensitivity troponin of 157).  Diagnostic catheterization initially showed a mid RCA stenosis however, after intracoronary nitroglycerin, the vessel dilated and only 40% stenosis was noted.  He has been medically managed since with long-acting nitrate and calcium channel blocker therapy.  In January 2021, he was admitted with chest pain and ruled out.  This was in the setting of noncompliance with nitrate therapy.  He subsequently underwent stress testing which was low risk without evidence of ischemia or infarct.  Most recent echocardiogram in January 2021 showed normal LV function with an EF of 60 to 65%, mild LVH, grade 1 diastolic dysfunction, and no significant valvular disease.  Mr. Danner was last seen in cardiology clinic in December 2021 at which time he was doing reasonably well.  He continued to smoke at that time.  Over the past year, he has done well.  He continues to work full-time in the [employer].  He is reasonably active and has not experienced any chest pain.  He has chronic dyspnea on exertion in the setting of COPD.  He continues to smoke just under a pack a day.  He previously tried Chantix but did not notice any reduction in nicotine craving, and it also caused nausea and vomiting therefore, he discontinued.  He might be interested in Wellbutrin but will discuss with his primary care provider as he is also taking Lexapro.  He does use a nebulizer as needed and feels that this does improve his dyspnea.  Though he has prescription for albuterol, he cannot afford the MDI.  He denies palpitations, PND, orthopnea, dizziness, syncope, edema, or early satiety.  Home Medications    Current Outpatient Medications  Medication Sig Dispense Refill   acetaminophen (TYLENOL) 500 MG tablet Take 500-1,000 mg by mouth every 6 (six) hours as needed for moderate pain.     albuterol (VENTOLIN HFA) 108 (90 Base) MCG/ACT inhaler Inhale 2 puffs into the lungs every 6 (six) hours as  needed for wheezing or shortness of breath.     amLODipine (NORVASC) 2.5 MG tablet Take 1 tablet (2.5 mg total) by mouth daily. PLEASE SCHEDULE OFFICE VISIT FOR FURTHER REFILLS. THANK YOU! 30 tablet 0   aspirin EC 81 MG tablet Take 81 mg by mouth daily.     atorvastatin (LIPITOR) 80 MG tablet TAKE 1 TABLET BY MOUTH  DAILY 90 tablet 0   benzonatate (TESSALON) 200 MG capsule Take 1 capsule (200 mg total) by mouth 2 (two) times daily as needed for cough. 20 capsule 0   blood glucose meter kit and supplies KIT Dispense based on patient and insurance preference. Use up to two times daily as directed. 1 each 0   escitalopram (LEXAPRO) 10 MG tablet TAKE 1 TABLET BY MOUTH  DAILY 90 tablet 3   ezetimibe (ZETIA) 10 MG tablet Take 1 tablet (10 mg total) by mouth daily. 90 tablet 1   fludrocortisone (FLORINEF) 0.1 MG tablet TAKE 2 TABLETS BY MOUTH  DAILY 180 tablet 0   ibuprofen (ADVIL) 200 MG tablet Take 200-800 mg by mouth every 6 (six) hours as needed for moderate pain.     ipratropium-albuterol (DUONEB) 0.5-2.5 (3) MG/3ML SOLN Take 3 mLs by nebulization every 6 (six) hours as needed. Take scheduled twice a day 360  mL 3   isosorbide mononitrate (IMDUR) 30 MG 24 hr tablet TAKE 1 TABLET BY MOUTH  DAILY 90 tablet 0   methocarbamol (ROBAXIN) 750 MG tablet Take 750 mg by mouth every 8 (eight) hours as needed for muscle spasms.     mometasone-formoterol (DULERA) 200-5 MCG/ACT AERO Inhale 2 puffs into the lungs 2 (two) times daily. 1 each 1   Multiple Vitamin (MULTIVITAMIN WITH MINERALS) TABS tablet Take 1 tablet by mouth daily.     nitroGLYCERIN (NITROSTAT) 0.4 MG SL tablet DISSOLVE 1 TABLET UNDER THE TONGUE EVERY 5 MINUTES AS NEEDED FOR CHEST PAIN 25 tablet 3   Omega-3 Fatty Acids (FISH OIL) 1200 MG CAPS Take 1,200 mg by mouth daily.     No current facility-administered medications for this visit.     Review of Systems    Chronic dyspnea on exertion in the setting of COPD and ongoing tobacco abuse.  He  denies chest pain, palpitations, PND, orthopnea, dizziness, syncope, edema, or early satiety.  All other systems reviewed and are otherwise negative except as noted above.    Physical Exam    VS:  BP 114/60 (BP Location: Left Arm, Patient Position: Sitting, Cuff Size: Normal)    Pulse 74    Ht 5\' 5"  (1.651 m)    Wt 133 lb (60.3 kg)    SpO2 97%    BMI 22.13 kg/m  , BMI Body mass index is 22.13 kg/m.     GEN: Thin, in no acute distress. HEENT: normal. Neck: Supple, no JVD, carotid bruits, or masses. Cardiac: RRR, no murmurs, rubs, or gallops. No clubbing, cyanosis, edema.  Radials 2+/PT 1+ and equal bilaterally.  Respiratory:  Respirations regular and unlabored, diminished breath sounds bilaterally. GI: Soft, nontender, nondistended, BS + x 4. MS: no deformity or atrophy. Skin: warm and dry, no rash. Neuro:  Strength and sensation are intact. Psych: Normal affect.  Accessory Clinical Findings    ECG personally reviewed by me today -regular sinus rhythm, 74- no acute changes.  Lab Results  Component Value Date   WBC 10.3 02/27/2021   HGB 12.1 (L) 02/27/2021   HCT 34.8 (L) 02/27/2021   MCV 92.9 02/27/2021   PLT 198.0 02/27/2021   Lab Results  Component Value Date   CREATININE 1.04 02/27/2021   BUN 14 02/27/2021   NA 146 (H) 02/27/2021   K 3.2 (L) 02/27/2021   CL 106 02/27/2021   CO2 33 (H) 02/27/2021   Lab Results  Component Value Date   ALT 26 02/27/2021   AST 21 02/27/2021   ALKPHOS 75 02/27/2021   BILITOT 0.6 02/27/2021   Lab Results  Component Value Date   CHOL 95 01/29/2021   HDL 41.20 01/29/2021   LDLCALC 43 01/29/2021   LDLDIRECT 102.5 (H) 02/07/2020   TRIG 52.0 01/29/2021   CHOLHDL 2 01/29/2021    Lab Results  Component Value Date   HGBA1C 6.2 (A) 09/15/2021    Assessment & Plan    1.  CAD (nonobstructive)/coronary vasospasm: Status post non-STEMI in July 2020 with catheterization revealing coronary vasospasm in the RCA and otherwise nonobstructive  disease.  He has been successfully managed with long-acting nitrate and amlodipine therapy.  Doses of each Amreen low in the setting of chronic orthostasis requiring Florinef.  He has not had any chest pain over the past year.  His chronic dyspnea on exertion in the setting of COPD and ongoing tobacco abuse.  Continue aspirin, statin, Zetia, amlodipine, and long-acting nitrate therapy.  2.  Orthostatic hypotension: He denies any recent orthostasis and remains on Florinef.  3.  Hyperlipidemia: LDL of 43 in June 2022 with normal LFTs.  He remains on atorvastatin and Zetia.  4.  Tobacco abuse/COPD: He has chronic dyspnea exertion for which he uses a nebulizer.  Smoking just under a pack a day.  Previously tried Chantix which resulted in nausea, vomiting, and no significant change in nicotine craving.  He might be interested in Wellbutrin but will discuss with his primary care provider as he is also taking Lexapro.  He does not think nicotine patches will help him.  Complete cessation advised.  5.  Disposition: Follow-up in 1 year or sooner if necessary.   Murray Hodgkins, NP 09/17/2021, 8:32 AM

## 2021-09-18 NOTE — Telephone Encounter (Signed)
Attempted to schedule.  

## 2021-09-23 ENCOUNTER — Telehealth: Payer: Self-pay | Admitting: Family Medicine

## 2021-09-23 MED ORDER — PREDNISONE 20 MG PO TABS: ORAL_TABLET | ORAL | 0 refills | Status: DC

## 2021-09-23 NOTE — Telephone Encounter (Signed)
Isaiah Rangel notified as instructed by telephone.  Patient states understanding.  He will touch base with [place] once he completes the prednisone course if still symptomatic.

## 2021-09-23 NOTE — Telephone Encounter (Signed)
Can you call him:  More challenging to offer any type of permanent solution when he has such extensive degenerative changes  I am sending in a course of some prednisone for him to take to hopefully decrease symptoms.    If still symptomatic, then a round of PT would be a good idea.   If still symptomatic after that, then getting an MRI of his cervical spine may be a good idea to further evaluate all of the soft tissue and nerves.     Meds ordered this encounter  Medications   predniSONE (DELTASONE) 20 MG tablet    Sig: 2 tabs po for 7 days, then 1 tab po for 7 days    Dispense:  21 tablet    Refill:  0

## 2021-09-23 NOTE — Telephone Encounter (Signed)
-----   Message from Carter Kitten, [place] sent at 09/17/2021  9:33 AM EST ----- Mr. Comer notified as instructed by telephone.  Patient is asking what the next step is.  What can he do for this?  Please advise.

## 2021-09-25 ENCOUNTER — Other Ambulatory Visit: Payer: Self-pay | Admitting: Cardiovascular Disease

## 2021-09-25 DIAGNOSIS — I2510 Atherosclerotic heart disease of native coronary artery without angina pectoris: Secondary | ICD-10-CM

## 2021-10-03 ENCOUNTER — Telehealth: Payer: Self-pay | Admitting: Family Medicine

## 2021-10-03 NOTE — Telephone Encounter (Signed)
Isaiah Rangel called in and wanted to relate the message to Dr.[name] that the prednisone is working and he is able to sleep nat night the only downside is having a upset stomach at night.

## 2021-10-07 NOTE — Progress Notes (Signed)
10/12/21 4:36 PM   Isaiah Rangel 10-08-1960 [id number]  Referring provider:  Owens Loffler, MD [place],  [place] [address] Chief Complaint  Patient presents with   Nephrolithiasis     HPI: Isaiah Rangel is a 61 y.o.male with a personal history of nephrolithiasis and gross hematuria, who presents today for a follow-up.   His most recent stone episode was in 05/2020 he underwent staged URS for left sided stone.   CT renal stone study in 12/2020 visualized no acute abnormality, no evidence for hydronephrosis or kidney stones.   KUB was personally reviewed today and did not visualize any stones.   He denies any pain or blood in his urine. He reports that he has stopped his sugar intake.  PMH: Past Medical History:  Diagnosis Date   BPH (benign prostatic hyperplasia)    COPD (chronic obstructive pulmonary disease) ([place])    Coronary artery disease/Coronary Vasospasm    a. 03/2013 Cath: min irregs. EF >55%; b. 03/2019 Cath: LM nl, LAD nl, LCX nl, OM1 40, RCA 30p, 36m-->40% after IC NTG; c. 12/2019 MV: EF 65%, no ischemia/infarct-->low risk.   Diabetes mellitus type 2, diet-controlled ([place]) 53/61/4431   Diastolic dysfunction    a. 03/2019 Echo: EF 50-55%, nl RV size/fxn. Trace MR/TR; b. 09/2019 Echo: EF 60-65%, mild LVH, GrI DD, no rwma, triv MR, mild AoV sclerosis w/o stenosis.   GAD (generalized anxiety disorder)    GERD (gastroesophageal reflux disease)    History of kidney stones    Hyperlipidemia    Myocardial infarction ([place]) 03/2019   NO STENTS   Skin cancer    basal and squamous   Tobacco abuse     Surgical History: Past Surgical History:  Procedure Laterality Date   BACK SURGERY  1998   NO METAL   CARDIAC CATHETERIZATION  04/06/13   [facility]- minor luminal irregularities, otherwise normal cors; EF > 55%. Medical management and smoking cessation recommended   CATARACT EXTRACTION W/PHACO Right 08/28/2020   Procedure: CATARACT EXTRACTION PHACO AND  INTRAOCULAR LENS PLACEMENT ([place]) RIGHT Tyler Deis;  Surgeon: Leandrew Koyanagi, MD;  Location: [place];  Service: Ophthalmology;  Laterality: Right;  1.77 0:31.9 5.6%   CYSTOSCOPY/URETEROSCOPY/HOLMIUM LASER/STENT PLACEMENT Left 06/13/2020   Procedure: CYSTOSCOPY/URETEROSCOPY//STENT PLACEMENT;  Surgeon: Hollice Espy, MD;  Location: [facility];  Service: Urology;  Laterality: Left;   CYSTOSCOPY/URETEROSCOPY/HOLMIUM LASER/STENT PLACEMENT Left 06/24/2020   Procedure: CYSTOSCOPY/URETEROSCOPY/HOLMIUM LASER/STENT PLACEMENT;  Surgeon: Hollice Espy, MD;  Location: [facility];  Service: Urology;  Laterality: Left;   KNEE ARTHROSCOPY W/ PARTIAL MEDIAL MENISCECTOMY Right 2012   [place], 2012   KNEE ARTHROSCOPY WITH MEDIAL MENISECTOMY Left 11/13/2020   Procedure: Left knee arthroscopy with partial medial or lateral menisectomy, possible chondroplasty, possible partial synovectomy;  Surgeon: Lovell Sheehan, MD;  Location: [facility];  Service: Orthopedics;  Laterality: Left;   LEFT HEART CATH AND CORONARY ANGIOGRAPHY N/A 03/20/2019   Procedure: LEFT HEART CATH AND CORONARY ANGIOGRAPHY;  Surgeon: Troy Sine, MD;  Location: [facility] [facility];  Service: Cardiovascular;  Laterality: N/A;   NASAL SINUS SURGERY     neck fusion  2000   TONSILLECTOMY     VASECTOMY      Home Medications:  Allergies as of 10/08/2021       Reactions   Other Nausea And Vomiting, Other (See Comments)   Quail eggs caused severe stomach pain and n/v        Medication List  Accurate as of October 08, 2021 11:59 PM. If you have any questions, ask your nurse or doctor.          acetaminophen 500 MG tablet Commonly known as: TYLENOL Take 500-1,000 mg by mouth every 6 (six) hours as needed for moderate pain.   albuterol 108 (90 Base) MCG/ACT inhaler Commonly known as: VENTOLIN HFA Inhale 2 puffs into the lungs every 6 (six) hours as needed for wheezing or shortness of breath.   amLODipine 2.5  MG tablet Commonly known as: NORVASC Take 1 tablet (2.5 mg total) by mouth daily.   aspirin EC 81 MG tablet Take 81 mg by mouth daily.   atorvastatin 80 MG tablet Commonly known as: LIPITOR Take 1 tablet (80 mg total) by mouth daily.   benzonatate 200 MG capsule Commonly known as: TESSALON Take 1 capsule (200 mg total) by mouth 2 (two) times daily as needed for cough.   blood glucose meter kit and supplies Kit Dispense based on patient and insurance preference. Use up to two times daily as directed.   Dulera 200-5 MCG/ACT Aero Generic drug: mometasone-formoterol Inhale 2 puffs into the lungs 2 (two) times daily.   escitalopram 10 MG tablet Commonly known as: LEXAPRO TAKE 1 TABLET BY MOUTH  DAILY   ezetimibe 10 MG tablet Commonly known as: ZETIA Take 1 tablet (10 mg total) by mouth daily.   Fish Oil 1200 MG Caps Take 1,200 mg by mouth daily.   fludrocortisone 0.1 MG tablet Commonly known as: FLORINEF Take 2 tablets (200 mcg total) by mouth daily.   ibuprofen 200 MG tablet Commonly known as: ADVIL Take 200-800 mg by mouth every 6 (six) hours as needed for moderate pain.   ipratropium-albuterol 0.5-2.5 (3) MG/3ML Soln Commonly known as: DUONEB Take 3 mLs by nebulization every 6 (six) hours as needed. Take scheduled twice a day   isosorbide mononitrate 30 MG 24 hr tablet Commonly known as: IMDUR Take 1 tablet (30 mg total) by mouth daily.   methocarbamol 750 MG tablet Commonly known as: ROBAXIN Take 750 mg by mouth every 8 (eight) hours as needed for muscle spasms.   multivitamin with minerals Tabs tablet Take 1 tablet by mouth daily.   nitroGLYCERIN 0.4 MG SL tablet Commonly known as: NITROSTAT DISSOLVE 1 TABLET UNDER THE TONGUE EVERY 5 MINUTES AS NEEDED FOR CHEST PAIN   predniSONE 20 MG tablet Commonly known as: DELTASONE 2 tabs po for 7 days, then 1 tab po for 7 days        Allergies:  Allergies  Allergen Reactions   Other Nausea And Vomiting and  Other (See Comments)    Quail eggs caused severe stomach pain and n/v    Family History: Family History  Problem Relation Age of Onset   Arthritis Father    Coronary artery disease Father    Hypertension Father    CVA Father 10   Colon cancer Mother    Heart attack Brother    Heart attack Brother    Coronary artery disease Brother    Esophageal cancer Neg Hx    Stomach cancer Neg Hx    Rectal cancer Neg Hx     Social History:  reports that he has been smoking cigarettes. He has a 40.00 pack-year smoking history. He has never used smokeless tobacco. He reports that he does not drink alcohol and does not use drugs.   Physical Exam: Constitutional:  Alert and oriented, No acute distress. HEENT: [place] AT, moist mucus membranes.  Trachea midline, no masses. Cardiovascular: No clubbing, cyanosis, or edema. Respiratory: Normal respiratory effort, no increased work of breathing. Skin: No rashes, bruises or suspicious lesions. Neurologic: Grossly intact, no focal deficits, moving all 4 extremities. Psychiatric: Normal mood and affect.  Laboratory Data:  Lab Results  Component Value Date   CREATININE 1.04 02/27/2021   Lab Results  Component Value Date   PSA 0.78 08/30/2018   PSA 1.38 06/17/2016   PSA 0.83 02/23/2013   Lab Results  Component Value Date   HGBA1C 6.2 (A) 09/15/2021   Urinalysis - Negative for micro/gross hematuria   Pertinent Imaging: KUB imaging personally reviewed today.  No obvious new or residual stone disease  Assessment & Plan:   1. Kidney stones - Status post successful left staged ureteroscopy for an obstructing left upper pole infundibular stone - KUB was reassuring today; will contact his if radiological interpretation visualizes a stone.  - We discussed general stone prevention techniques including drinking plenty water with goal of producing 2.5 L urine daily, increased citric acid intake, avoidance of high oxalate containing foods, and decreased  salt intake.  Information about dietary recommendations given today.   2. History of gross hematuria - Urine negative today  -likely stone related in past - Return is he experiences any further episodes of hematuria   Follow-up as needed.   Conley Rolls as a Education administrator for Hollice Espy, MD.,have documented all relevant documentation on the behalf of Hollice Espy, MD,as directed by  Hollice Espy, MD while in the presence of Hollice Espy, MD.  I have reviewed the above documentation for accuracy and completeness, and I agree with the above.   Hollice Espy, MD   [facility] [address], [place], [place] [address] [phone]

## 2021-10-08 ENCOUNTER — Ambulatory Visit (INDEPENDENT_AMBULATORY_CARE_PROVIDER_SITE_OTHER): Payer: 59 | Admitting: Urology

## 2021-10-08 ENCOUNTER — Other Ambulatory Visit: Payer: Self-pay

## 2021-10-08 ENCOUNTER — Ambulatory Visit
Admission: RE | Admit: 2021-10-08 | Discharge: 2021-10-08 | Disposition: A | Discharge status: Home / Self Care | Payer: 59 | Attending: Urology | Admitting: Urology

## 2021-10-08 ENCOUNTER — Ambulatory Visit
Admission: RE | Admit: 2021-10-08 | Discharge: 2021-10-08 | Disposition: A | Discharge status: Home / Self Care | Payer: 59 | Source: Ambulatory Visit | Attending: Urology | Admitting: Urology

## 2021-10-08 DIAGNOSIS — N200 Calculus of kidney: Secondary | ICD-10-CM

## 2021-10-08 DIAGNOSIS — R310 Gross hematuria: Secondary | ICD-10-CM

## 2021-10-08 LAB — URINALYSIS, COMPLETE
Bilirubin, UA: NEGATIVE
Glucose, UA: NEGATIVE
Ketones, UA: NEGATIVE
Leukocytes,UA: NEGATIVE
Nitrite, UA: NEGATIVE
Specific Gravity, UA: 1.02 (ref 1.005–1.030)
Urobilinogen, Ur: 0.2 mg/dL (ref 0.2–1.0)
pH, UA: 7 (ref 5.0–7.5)

## 2021-10-08 LAB — MICROSCOPIC EXAMINATION
Bacteria, UA: NONE SEEN
Epithelial Cells (non renal): NONE SEEN /hpf (ref 0–10)

## 2021-10-09 ENCOUNTER — Other Ambulatory Visit: Payer: Self-pay | Admitting: Cardiovascular Disease

## 2021-10-09 DIAGNOSIS — I951 Orthostatic hypotension: Secondary | ICD-10-CM

## 2021-10-13 ENCOUNTER — Ambulatory Visit (INDEPENDENT_AMBULATORY_CARE_PROVIDER_SITE_OTHER): Payer: 59 | Admitting: Family Medicine

## 2021-10-13 ENCOUNTER — Other Ambulatory Visit: Payer: Self-pay

## 2021-10-13 ENCOUNTER — Encounter: Payer: Self-pay | Admitting: Family Medicine

## 2021-10-13 VITALS — BP 134/82 | HR 88 | Temp 97.5°F | Ht 65.0 in | Wt 131.1 lb

## 2021-10-13 DIAGNOSIS — R200 Anesthesia of skin: Secondary | ICD-10-CM

## 2021-10-13 DIAGNOSIS — M5412 Radiculopathy, cervical region: Secondary | ICD-10-CM | POA: Diagnosis not present

## 2021-10-13 DIAGNOSIS — R29898 Other symptoms and signs involving the musculoskeletal system: Secondary | ICD-10-CM

## 2021-10-13 DIAGNOSIS — M4802 Spinal stenosis, cervical region: Secondary | ICD-10-CM

## 2021-10-13 DIAGNOSIS — G950 Syringomyelia and syringobulbia: Secondary | ICD-10-CM

## 2021-10-13 DIAGNOSIS — Z981 Arthrodesis status: Secondary | ICD-10-CM | POA: Diagnosis not present

## 2021-10-13 MED ORDER — PREDNISONE 20 MG PO TABS: ORAL_TABLET | ORAL | 0 refills | Status: DC

## 2021-10-13 NOTE — Progress Notes (Addendum)
[name], MD, [place] at [facility] [place], [address]  Phone: [phone]   FAX: Isaiah Rangel - 61 y.o. male   MRN [id number]   Date of Birth: 09/05/1960  Date: 10/13/2021   PCP: Owens Loffler, MD   Referral: Owens Loffler, MD  Chief Complaint  Patient presents with   Insomnia    C/o trouble staying asleep due to R side neck pain.  Also, c/o R side facial tingling/numbness.  Sxs started about 2 mos ago.  Neck pain tx with prednisone previously, helpful.     This visit occurred during the SARS-CoV-2 public health emergency.  Safety protocols were in place, including screening questions prior to the visit, additional usage of staff PPE, and extensive cleaning of exam room while observing appropriate contact time as indicated for disinfecting solutions.   Subjective:   Isaiah Rangel is a 61 y.o. very pleasant male patient with Body mass index is 21.82 kg/m. 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 At baseline, he is an extremely active for age 44 year old.  He works with his hands and arms all of the time and he has historically been very strong.  History of prior C5 C7 cervical fusion.  I did give him a course of some prednisone in the last few weeks.  Felt ok while on his pred, and then started to come back.  Sat did hardly move and felt pretty terrible.  Right now he is having severe pain as well as some radicular symptoms.  Very difficult time working since the onset of his initial recurrence of symptoms. Slept on the couch this morning.   R hand dropping things, dropping screwdrivers.  L arm tingling.   Will come and go L arm pain the day after working.   Dec r hand grip   Wt Readings from Last 3 Encounters:  10/13/21 131 lb 2 oz (59.5 kg)  09/17/21 133 lb (60.3  kg)  09/15/21 135 lb 6 oz (61.4 kg)     CLINICAL DATA:  New radicular symptoms   EXAM: CERVICAL SPINE - COMPLETE 4+ VIEW   COMPARISON:  Cervical spine x-ray 03/20/2010   FINDINGS: Anterior cervical fusion hardware intact from C5 through C7. No acute vertebral body fracture or subluxation identified. Moderate to severe intervertebral disc height loss at C3-C4 and more significantly C4-C5, worsened since previous study. There appears to be severe bilateral neural foraminal narrowing at C3-C4 and moderate right neural foraminal narrowing at C4-C5, also appear worse than on previous study. No prevertebral soft tissue swelling visualized.   IMPRESSION: Worsening degenerative changes of the cervical spine as described.     Electronically Signed   By: Ofilia Neas M.D.   On: 09/15/2021 14:24    Review of Systems is noted in the HPI, as appropriate  Patient Active Problem List   Diagnosis Date Noted   NSTEMI (non-ST elevated myocardial infarction) ([place]) 03/20/2019    Priority: High   Coronary artery disease involving native coronary artery of native heart without angina pectoris 02/21/2016    Priority: High   Cerebrovascular disease 04/29/2015    Priority: High   Diabetes mellitus type 2, diet-controlled ([place]) 02/05/2021    Priority: Medium    COPD (chronic obstructive pulmonary disease) with chronic bronchitis ([place]) 09/05/2018    Priority: Medium  S/P cardiac catheterization 04/19/2013    Priority: Medium    Smoking 09/09/2011    Priority: Medium    Mixed hyperlipidemia 04/09/2010    Priority: Medium    COPD exacerbation ([place]) 06/27/2021   Nephrolithiasis 06/13/2020   Depression    Acute ischemic enteritis ([place]) 11/26/2017   GERD 09/21/2008   ERECTILE DYSFUNCTION 09/20/2008    Past Medical History:  Diagnosis Date   BPH (benign prostatic hyperplasia)    COPD (chronic obstructive pulmonary disease) (HCC)    Coronary artery disease/Coronary Vasospasm    a.  03/2013 Cath: min irregs. EF >55%; b. 03/2019 Cath: LM nl, LAD nl, LCX nl, OM1 40, RCA 30p, 56m->40% after IC NTG; c. 12/2019 MV: EF 65%, no ischemia/infarct-->low risk.   Diabetes mellitus type 2, diet-controlled ([place] 070/26/3785  Diastolic dysfunction    a. 03/2019 Echo: EF 50-55%, nl RV size/fxn. Trace MR/TR; b. 09/2019 Echo: EF 60-65%, mild LVH, GrI DD, no rwma, triv MR, mild AoV sclerosis w/o stenosis.   GAD (generalized anxiety disorder)    GERD (gastroesophageal reflux disease)    History of kidney stones    Hyperlipidemia    Myocardial infarction ([place] 03/2019   NO STENTS   Skin cancer    basal and squamous   Tobacco abuse     Past Surgical History:  Procedure Laterality Date   BACK SURGERY  1998   NO METAL   CARDIAC CATHETERIZATION  04/06/13   [facility]- minor luminal irregularities, otherwise normal cors; EF > 55%. Medical management and smoking cessation recommended   CATARACT EXTRACTION W/PHACO Right 08/28/2020   Procedure: CATARACT EXTRACTION PHACO AND INTRAOCULAR LENS PLACEMENT ([place] RIGHT ETyler Rangel  Surgeon: BLeandrew Koyanagi MD;  [place]  Service: Ophthalmology;  Laterality: Right;  1.77 0:31.9 5.6%   CYSTOSCOPY/URETEROSCOPY/HOLMIUM LASER/STENT PLACEMENT Left 06/13/2020   Procedure: CYSTOSCOPY/URETEROSCOPY//STENT PLACEMENT;  Surgeon: BHollice Espy MD;  Location: [facility] ORS;  Service: Urology;  Laterality: Left;   CYSTOSCOPY/URETEROSCOPY/HOLMIUM LASER/STENT PLACEMENT Left 06/24/2020   Procedure: CYSTOSCOPY/URETEROSCOPY/HOLMIUM LASER/STENT PLACEMENT;  Surgeon: BHollice Espy MD;  Location: [facility] ORS;  Service: Urology;  Laterality: Left;   KNEE ARTHROSCOPY W/ PARTIAL MEDIAL MENISCECTOMY Right 2012   [place], 2012   KNEE ARTHROSCOPY WITH MEDIAL MENISECTOMY Left 11/13/2020   Procedure: Left knee arthroscopy with partial medial or lateral menisectomy, possible chondroplasty, possible partial synovectomy;  Surgeon: BLovell Sheehan MD;  Location: [facility]  ORS;  Service: Orthopedics;  Laterality: Left;   LEFT HEART CATH AND CORONARY ANGIOGRAPHY N/A 03/20/2019   Procedure: LEFT HEART CATH AND CORONARY ANGIOGRAPHY;  Surgeon: KTroy Sine MD;  [facility];  Service: Cardiovascular;  Laterality: N/A;   NASAL SINUS SURGERY     neck fusion  2000   TONSILLECTOMY     VASECTOMY      Family History  Problem Relation Age of Onset   Arthritis Father    Coronary artery disease Father    Hypertension Father    CVA Father 863  Colon cancer Mother    Heart attack Brother    Heart attack Brother    Coronary artery disease Brother    Esophageal cancer Neg Hx    Stomach cancer Neg Hx    Rectal cancer Neg Hx      Objective:   BP 134/82    Pulse 88    Temp (!) 97.5 F (36.4 C) (Temporal)    Ht 5' 5" (1.651 m)    Wt 131 lb 2 oz (59.5 kg)  SpO2 98%    BMI 21.82 kg/m   GEN: No acute distress; alert,appropriate. PULM: Breathing comfortably in no respiratory distress PSYCH: Normally interactive.    GEN: alert,appropriate PSYCH: Normally interactive. Cooperative during the interview.   CERVICAL SPINE EXAM Range of motion: Flexion, extension, lateral bending, and rotation: Approximate 65% loss of motion in all directions Pain with terminal motion: Yes, all directions Spinous Processes: NT SCM: NT Upper paracervical muscles: Diffuse Upper traps: NT C5-T1 intact, sensation His grip strength on the right side is decreased compared to the left side.  Baseline, very strong grip, notably different.  Laboratory and Imaging Data: CLINICAL DATA:  New radicular symptoms   EXAM: CERVICAL SPINE - COMPLETE 4+ VIEW   COMPARISON:  Cervical spine x-ray 03/20/2010   FINDINGS: Anterior cervical fusion hardware intact from C5 through C7. No acute vertebral body fracture or subluxation identified. Moderate to severe intervertebral disc height loss at C3-C4 and more significantly C4-C5, worsened since previous study. There appears  to be severe bilateral neural foraminal narrowing at C3-C4 and moderate right neural foraminal narrowing at C4-C5, also appear worse than on previous study. No prevertebral soft tissue swelling visualized.   IMPRESSION: Worsening degenerative changes of the cervical spine as described.     Electronically Signed   By: Ofilia Neas M.D.   On: 09/15/2021 14:24     Assessment and Plan:     ICD-10-CM   1. Cervical radiculopathy, acute  M54.12 MR CERVICAL SPINE W WO CONTRAST    Ambulatory referral to Neurosurgery    2. Decreased grip strength of right hand  R29.898 MR CERVICAL [place]    Ambulatory referral to Neurosurgery    3. Left arm numbness  R20.0 MR CERVICAL SPINE W WO CONTRAST    Ambulatory referral to Neurosurgery    4. History of fusion of cervical spine  Z98.1 Ambulatory referral to Neurosurgery    5. Spinal stenosis of cervical region  M48.02 Ambulatory referral to Neurosurgery    6. Foraminal stenosis of cervical region  M48.02 Ambulatory referral to Neurosurgery    7. Syrinx ([place])  G95.0 Ambulatory referral to Neurosurgery     Worsening cervical pain with cervical radiculopathy.  Subjective numbness, and notable decreased grip strength on the right hand.  Patient works with his hands all day long, and he has noted that he dropped some screwdrivers, and decreased on exam.  Obtain an MRI of the cervical spine with and without contrast to evaluate the patient's prior cervical fusion as well as assess for spinal cord compromise, spinal stenosis, and foraminal stenosis.  Abnormal neurological exam with decreased grip strength on right hand dominant hand.  Additional plan of care will depend upon advanced imaging.  Addendum: 11/10/21 10:06 AM   At this point, the patient's MRI has returned, and he has some severe spinal stenosis, multiple level severe foraminal stenosis, prior multilevel fusion, and a small syrinx that C6-C7.  Patient has neurological  findings above including some numbness, decreased grip on the right hand.  I have discussed with the patient, and he needs to have neurosurgical consultation.  MR CERVICAL SPINE W WO CONTRAST  Result Date: 11/04/2021 CLINICAL DATA:  Headaches and pain in the neck, bilateral shoulders, right arm, with numbness in the right face for 3 and half months EXAM: MRI CERVICAL SPINE WITHOUT AND WITH CONTRAST TECHNIQUE: Multiplanar and multiecho pulse sequences of the cervical spine, to include the craniocervical junction and cervicothoracic junction, were obtained without and with intravenous  contrast. CONTRAST:  76m MULTIHANCE GADOBENATE DIMEGLUMINE 529 MG/ML IV SOLN COMPARISON:  Cervical spine radiographs 09/15/2021 FINDINGS: Alignment: There is slight reversal of the normal cervical spine lordosis centered at C3, with trace retrolisthesis C3 on C4 and C4 on C5. Alignment is otherwise normal. Vertebrae: Patient is status post C5 through C7 ACDF with evidence of osseous fusion. There is no evidence of complication. Marrow signal at the other levels is within normal limits. There is no suspicious marrow signal abnormality. There is no marrow edema. Cord: There is a syrinx in the cervical cord spanning the C6 and C7 levels with a maximal thickness for proximally 2 mm. The cord is otherwise normal in signal and morphology. Posterior Fossa, vertebral arteries, paraspinal tissues: Imaged posterior fossa is unremarkable. Vertebral artery flow voids are present. The paraspinal soft tissues are unremarkable. Disc levels: There is significant disc desiccation and narrowing at C4-C5, with more mild desiccation and narrowing at the other levels. C2-C3: There is left worse than right uncovertebral ridging and mild facet arthropathy resulting in moderate left and mild right neural foraminal stenosis without significant spinal canal stenosis C3-C4: There is a prominent posterior disc osteophyte complex with a central protrusion,  uncovertebral ridging, and bilateral facet arthropathy resulting in moderate spinal canal stenosis with slight indentation of the ventral cord, and severe bilateral neural foraminal stenosis. C4-C5: There is a posterior disc osteophyte complex eccentric to the left with left worse than right uncovertebral ridging and bilateral facet arthropathy resulting in mild to moderate spinal canal stenosis and severe left and mild right neural foraminal stenosis. C5-C6: Status post ACDF. Facet arthropathy contributes to mild left worse than right neural foraminal stenosis without significant spinal canal stenosis C6-C7: Status post ACDF. There is uncovertebral ridging and bilateral facet arthropathy resulting in severe bilateral neural foraminal stenosis without significant spinal canal stenosis C7-T1: No significant spinal canal or neural foraminal stenosis. Other: There is mucoid debris in the trachea. IMPRESSION: 1. Status post C5 through C7 ACDF without evidence of complication. There is no residual spinal canal stenosis at the surgical levels, but there is severe bilateral neural foraminal stenosis at C6-C7. 2. Degenerative changes at C3-C4 resulting in moderate spinal canal stenosis with indentation of the cord and severe bilateral neural foraminal stenosis. 3. Mild-to-moderate spinal canal stenosis and severe left neural foraminal stenosis at C4-C5. 4. Trace retrolisthesis of C3 on C4 and C4 on C5. 5. Small syrinx in the cervical cord at C6-C7. 6. Mucoid debris in the trachea. Correlate with any signs or symptoms of aspiration. Electronically Signed   By: PValetta MoleM.D.   On: 11/04/2021 13:17     Meds ordered this encounter  Medications   DISCONTD: predniSONE (DELTASONE) 20 MG tablet    Sig: 2 tabs po for 7 days, then 1 tab po for 7 days    Dispense:  21 tablet    Refill:  0   Medications Discontinued During This Encounter  Medication Reason   predniSONE (DELTASONE) 20 MG tablet Completed Course   Orders  Placed This Encounter  Procedures   MR [place]  Ambulatory referral to Neurosurgery    Follow-up: No follow-ups on file.  Dragon [employer] One speech-to-text software was used for transcription in this dictation.  Possible transcriptional errors can occur using DEditor, commissioning   Signed,  SMaud Deed Liliauna Santoni, MD   Outpatient Encounter Medications as of 10/13/2021  Medication Sig   acetaminophen (TYLENOL) 500 MG tablet Take 500-1,000 mg by mouth  every 6 (six) hours as needed for moderate pain.   albuterol (VENTOLIN HFA) 108 (90 Base) MCG/ACT inhaler Inhale 2 puffs into the lungs every 6 (six) hours as needed for wheezing or shortness of breath.   amLODipine (NORVASC) 2.5 MG tablet Take 1 tablet (2.5 mg total) by mouth daily.   aspirin EC 81 MG tablet Take 81 mg by mouth daily.   atorvastatin (LIPITOR) 80 MG tablet Take 1 tablet (80 mg total) by mouth daily.   benzonatate (TESSALON) 200 MG capsule Take 1 capsule (200 mg total) by mouth 2 (two) times daily as needed for cough.   blood glucose meter kit and supplies KIT Dispense based on patient and insurance preference. Use up to two times daily as directed.   escitalopram (LEXAPRO) 10 MG tablet TAKE 1 TABLET BY MOUTH  DAILY   fludrocortisone (FLORINEF) 0.1 MG tablet TAKE 2 TABLETS BY MOUTH DAILY   ibuprofen (ADVIL) 200 MG tablet Take 200-800 mg by mouth every 6 (six) hours as needed for moderate pain.   ipratropium-albuterol (DUONEB) 0.5-2.5 (3) MG/3ML SOLN Take 3 mLs by nebulization every 6 (six) hours as needed. Take scheduled twice a day   methocarbamol (ROBAXIN) 750 MG tablet Take 750 mg by mouth every 8 (eight) hours as needed for muscle spasms.   mometasone-formoterol (DULERA) 200-5 MCG/ACT AERO Inhale 2 puffs into the lungs 2 (two) times daily.   Multiple Vitamin (MULTIVITAMIN WITH MINERALS) TABS tablet Take 1 tablet by mouth daily.   nitroGLYCERIN (NITROSTAT) 0.4 MG SL tablet DISSOLVE 1 TABLET UNDER THE TONGUE  EVERY 5 MINUTES AS NEEDED FOR CHEST PAIN   Omega-3 Fatty Acids (FISH OIL) 1200 MG CAPS Take 1,200 mg by mouth daily.   [DISCONTINUED] ezetimibe (ZETIA) 10 MG tablet Take 1 tablet (10 mg total) by mouth daily.   [DISCONTINUED] isosorbide mononitrate (IMDUR) 30 MG 24 hr tablet Take 1 tablet (30 mg total) by mouth daily.   [DISCONTINUED] predniSONE (DELTASONE) 20 MG tablet 2 tabs po for 7 days, then 1 tab po for 7 days   [DISCONTINUED] predniSONE (DELTASONE) 20 MG tablet 2 tabs po for 7 days, then 1 tab po for 7 days   No facility-administered encounter medications on file as of 10/13/2021.

## 2021-10-14 ENCOUNTER — Encounter: Payer: Self-pay | Admitting: Family Medicine

## 2021-10-17 ENCOUNTER — Other Ambulatory Visit: Payer: Self-pay | Admitting: Cardiovascular Disease

## 2021-10-17 DIAGNOSIS — I2510 Atherosclerotic heart disease of native coronary artery without angina pectoris: Secondary | ICD-10-CM

## 2021-10-17 DIAGNOSIS — E785 Hyperlipidemia, unspecified: Secondary | ICD-10-CM

## 2021-10-28 ENCOUNTER — Encounter: Payer: Self-pay | Admitting: *Deleted

## 2021-10-30 ENCOUNTER — Telehealth: Payer: Self-pay

## 2021-10-30 MED ORDER — PREDNISONE 20 MG PO TABS: ORAL_TABLET | ORAL | 0 refills | Status: DC

## 2021-10-30 NOTE — Telephone Encounter (Signed)
Patient is requesting refill on Prednisone. Patient states it helps with his pain. Please advise. Patient uses [facility] on [address] [place].

## 2021-10-30 NOTE — Telephone Encounter (Signed)
MRI of the cervical spine is pending. ?

## 2021-11-04 ENCOUNTER — Ambulatory Visit
Admission: RE | Admit: 2021-11-04 | Discharge: 2021-11-04 | Disposition: A | Discharge status: Home / Self Care | Payer: 59 | Source: Ambulatory Visit | Attending: Family Medicine | Admitting: Family Medicine

## 2021-11-04 DIAGNOSIS — M5412 Radiculopathy, cervical region: Secondary | ICD-10-CM

## 2021-11-04 DIAGNOSIS — R29898 Other symptoms and signs involving the musculoskeletal system: Secondary | ICD-10-CM

## 2021-11-04 DIAGNOSIS — R200 Anesthesia of skin: Secondary | ICD-10-CM

## 2021-11-04 MED ORDER — GADOBENATE DIMEGLUMINE 529 MG/ML IV SOLN
12.0000 mL | Freq: Once | INTRAVENOUS | Status: AC | PRN
  Administered 2021-11-04: 12 mL via INTRAVENOUS

## 2021-11-10 NOTE — Progress Notes (Signed)
Addendum in consultation made on the original note. ?

## 2021-11-10 NOTE — Addendum Note (Signed)
Addended by: Owens Loffler on: 11/10/2021 10:06 AM   Modules accepted: Orders

## 2021-11-11 ENCOUNTER — Other Ambulatory Visit: Payer: Self-pay | Admitting: Family Medicine

## 2021-11-11 ENCOUNTER — Telehealth: Payer: Self-pay | Admitting: Family Medicine

## 2021-11-11 NOTE — Telephone Encounter (Signed)
?  Encourage patient to contact the pharmacy for refills or they can request refills through [facility] ? ?LAST APPOINTMENT DATE:  Please schedule appointment if longer than 1 year ? ?NEXT APPOINTMENT DATE: ? ?MEDICATION:predniSONE (DELTASONE) 20 MG tablet ? ?Is the patient out of medication?  ? ?PHARMACY:[id number] - [place], [place] ? ?Let patient know to contact pharmacy at the end of the day to make sure medication is ready. ? ?Please notify patient to allow 48-72 hours to process ? ?CLINICAL FILLS OUT ALL BELOW:  ? ?LAST REFILL: ? ?QTY: ? ?REFILL DATE: ? ? ? ?OTHER COMMENTS:  ? ? ?Okay for refill? ? ?Please advise ? ? ?  ?

## 2021-11-11 NOTE — Telephone Encounter (Signed)
Neurosurgical consultation has been made.  They usually review the notes, MRI's before making an appointment. ?

## 2021-11-11 NOTE — Telephone Encounter (Signed)
Last office visit 10/13/21 for cervical radiculopathy.  Last refilled 10/30/21 for #21 with no refills.  No future appointments.  ?

## 2021-11-11 NOTE — Telephone Encounter (Signed)
Pt called asking about an appt for surgeon for his neck. Pt states he has had his MRI and was supposed to see a Psychologist, sport and exercise. Please advise.  ?

## 2021-11-12 MED ORDER — PREDNISONE 20 MG PO TABS: ORAL_TABLET | ORAL | 0 refills | Status: DC

## 2021-11-12 NOTE — Telephone Encounter (Signed)
[facility] appt pending ?

## 2021-11-15 NOTE — Telephone Encounter (Signed)
Good morning,  ? ?I have sent your Neurosurgery referral to [facility] for their office to review. ? ?They will contact you to schedule. If you haven't heard anything from within 1 week, You may also give their office a call in a few days to schedule.  ? ?[place] ?Located in: [facility] ?Address: [address] ?[place], [place] [address] ?Phone: [phone] ? ?If there are any issues with this please let us know. ? ? ?Have a good day! ?Varney Daily ?Referral Coordinator/CMA ? ?

## 2021-11-24 ENCOUNTER — Telehealth: Payer: Self-pay | Admitting: Family Medicine

## 2021-11-24 NOTE — Telephone Encounter (Signed)
Pt wife called asking for a call back to discuss pt referral. Please advise. ?

## 2021-11-25 ENCOUNTER — Encounter: Payer: Self-pay | Admitting: *Deleted

## 2021-11-25 NOTE — Telephone Encounter (Signed)
Mychart message sent to office information ? ?Called Isaiah Rangel (EC) and discussed as well.  ?She states they saw the Neurosurgeon Dr Ashok Pall, MD 11/24/21 ? ?Visit was very unprofessional and a waste of time. ?Isaiah Rangel states that she lost all respect and trust in this Provider when he started cussing several times during visit and was talking in a very unprofessional manner. She states they will NOT be going back to this Doctor or that office.  ? ?She stated that Dr Christella Noa did not want the patient to have any Prednisone, Cortisone Shots or pain meds. They advised that he needs PT instead - Isaiah Rangel states that she does not  feel this is needed at this time and that he needs to be referred to someone else. ? ?Requesting Referral to [facility] or [facility] for Neurosurgery - I will send referral - Do you have a recommendation for MD or location?? ? ?Patient requesting a refill of Prednisone - Dr Christella Noa did not prescribe anything at visit.  ? ?Isaiah Rangel is asking What is next as far as meds and treatment until we can get him in with Neurosurgeon that can help him (ie Prednisone or pains meds) ? ?

## 2021-11-27 ENCOUNTER — Other Ambulatory Visit: Payer: Self-pay | Admitting: Family Medicine

## 2021-11-27 DIAGNOSIS — J441 Chronic obstructive pulmonary disease with (acute) exacerbation: Secondary | ICD-10-CM

## 2021-11-27 MED ORDER — DULERA 200-5 MCG/ACT IN AERO: 2.0000 | INHALATION_SPRAY | Freq: Two times a day (BID) | RESPIRATORY_TRACT | 2 refills | Status: DC

## 2021-11-27 MED ORDER — TIZANIDINE HCL 4 MG PO TABS: 4.0000 mg | ORAL_TABLET | Freq: Two times a day (BID) | ORAL | 2 refills | Status: DC | PRN

## 2021-11-27 MED ORDER — HYDROCODONE-ACETAMINOPHEN 5-325 MG PO TABS: 1.0000 | ORAL_TABLET | Freq: Four times a day (QID) | ORAL | 0 refills | Status: DC | PRN

## 2021-11-27 MED ORDER — DICLOFENAC SODIUM 75 MG PO TBEC: 75.0000 mg | DELAYED_RELEASE_TABLET | Freq: Two times a day (BID) | ORAL | 3 refills | Status: DC

## 2021-11-27 NOTE — Addendum Note (Signed)
Addended by: Owens Loffler on: 11/27/2021 10:24 AM ? ? Modules accepted: Orders ? ?

## 2021-11-27 NOTE — Telephone Encounter (Signed)
Yes patient can be referral to [facility] Neurosurgery. Will send referral 11/28/21 ?

## 2021-11-27 NOTE — Telephone Encounter (Signed)
I talked to Ronnie's wife for a long time.  She has been an Therapist, sports for many years. ? ?Unfortunately it sounds like they had a very difficult conversation with Dr. Christella Noa.  They were fairly offended with some profanity that was used, and the entire interaction was very difficult. ? ?When they were going over plan of care including pain management, steroids, muscle relaxants, NSAIDs, physical therapy, ESI she tells me that he left the room without getting more of a definitive plan.  She was worried about this, and they do not have any confidence to see him in the future. ? ?I do think physical therapy is a good idea, and he is going to keep that appointment. ? ?They would like to see a different spine surgeon.  With these interactions and bedside manner problems, this would be my recommendation, as well. ? ?Right now, will use NSAIDS, Zanaflex, Norco prn.  Can add additional steroids if he does poorly. ? ? ?Ashtyn, can you help with a Neurosurgery appointment to [facility].  I would think they would be happy to evaluate the patient? ? ? ?Meds ordered this encounter  ?Medications  ? diclofenac (VOLTAREN) 75 MG EC tablet  ?  Sig: Take 1 tablet (75 mg total) by mouth 2 (two) times daily.  ?  Dispense:  60 tablet  ?  Refill:  3  ? tiZANidine (ZANAFLEX) 4 MG tablet  ?  Sig: Take 1 tablet (4 mg total) by mouth 2 (two) times daily as needed for muscle spasms. Primarily at night, since daytime use will make drowsy  ?  Dispense:  60 tablet  ?  Refill:  2  ? HYDROcodone-acetaminophen (NORCO/VICODIN) 5-325 MG tablet  ?  Sig: Take 1 tablet by mouth every 6 (six) hours as needed for moderate pain or severe pain.  ?  Dispense:  20 tablet  ?  Refill:  0  ? ?Medications Discontinued During This Encounter  ?Medication Reason  ? methocarbamol (ROBAXIN) 750 MG tablet   ? acetaminophen (TYLENOL) 500 MG tablet   ? ?

## 2021-11-27 NOTE — Telephone Encounter (Signed)
Last office visit 10/13/2021 for insomnia and cervical radiculopathy.  Last refilled 11/12/2021 for #21 with no refills.  No future appointments.  ?

## 2021-11-27 NOTE — Telephone Encounter (Signed)
?  Encourage patient to contact the pharmacy for refills or they can request refills through [facility] ? ?LAST APPOINTMENT DATE:  Please schedule appointment if longer than 1 year ? ?NEXT APPOINTMENT DATE: ? ?MEDICATION:mometasone-formoterol (DULERA) 200-5 MCG/ACT AERO,predniSONE (DELTASONE) 20 MG tablet ? ?Is the patient out of medication?  ? ?PHARMACY:[id number] - [place], [place] ? ?Let patient know to contact pharmacy at the end of the day to make sure medication is ready. ? ?Please notify patient to allow 48-72 hours to process ? ?CLINICAL FILLS OUT ALL BELOW:  ? ?LAST REFILL: ? ?QTY: ? ?REFILL DATE: ? ? ? ?OTHER COMMENTS:  ? ? ?Okay for refill? ? ?Please advise ? ? ?  ?

## 2021-12-01 ENCOUNTER — Telehealth: Payer: Self-pay | Admitting: *Deleted

## 2021-12-01 NOTE — Telephone Encounter (Signed)
Received fax from [facility] requesting PA for [facility].  PA completed on CoverMyMeds and sent to [employer] for review.  Can take up to 72 hours for a decision.  ?

## 2021-12-10 NOTE — Telephone Encounter (Signed)
PA for [facility] was denied.  The requested medication is not a covered benefit and is excluded from coverage in accordance with the terms and conditions of patient's plan benefit ?

## 2021-12-10 NOTE — Telephone Encounter (Signed)
Can you let him or his wife Mitzie know ? ?They will need to contact insurance to find their preferred product. ? ?Combination of inhaled corticosteroid and long-acting beta-agonist. ?Examples: advair, symbicort, or others. ?

## 2021-12-10 NOTE — Telephone Encounter (Signed)
Mitzi notified by telephone that Ruthe Mannan is not covered by insurance.   ? ?Formulary inhalers that do not require PAs:  ? ?Advair 115-21 mg ?Arnuity Ellipta 100 ?Flovent HFA 110 ?Trelegy Ellipta 62.5-25 ?Caprice Renshaw Pressair ?

## 2021-12-12 MED ORDER — TRELEGY ELLIPTA 100-62.5-25 MCG/ACT IN AEPB: 1.0000 | INHALATION_SPRAY | Freq: Every day | RESPIRATORY_TRACT | 3 refills | Status: DC

## 2021-12-12 NOTE — Telephone Encounter (Signed)
Trelegy sent as a substitution. ?

## 2021-12-12 NOTE — Addendum Note (Signed)
Addended by: Owens Loffler on: 12/12/2021 01:07 PM ? ? Modules accepted: Orders ? ?

## 2021-12-21 ENCOUNTER — Other Ambulatory Visit: Payer: Self-pay | Admitting: Family Medicine

## 2021-12-23 ENCOUNTER — Encounter: Payer: Self-pay | Admitting: *Deleted

## 2022-03-16 ENCOUNTER — Other Ambulatory Visit: Payer: Self-pay | Admitting: Cardiovascular Disease

## 2022-03-16 DIAGNOSIS — E785 Hyperlipidemia, unspecified: Secondary | ICD-10-CM

## 2022-03-17 ENCOUNTER — Ambulatory Visit
Admission: RE | Admit: 2022-03-17 | Discharge: 2022-03-17 | Disposition: A | Discharge status: Home / Self Care | Payer: 59 | Source: Ambulatory Visit | Attending: Acute Care | Admitting: Acute Care

## 2022-03-17 DIAGNOSIS — F17210 Nicotine dependence, cigarettes, uncomplicated: Secondary | ICD-10-CM | POA: Insufficient documentation

## 2022-03-17 DIAGNOSIS — Z87891 Personal history of nicotine dependence: Secondary | ICD-10-CM | POA: Insufficient documentation

## 2022-03-19 ENCOUNTER — Other Ambulatory Visit: Payer: Self-pay | Admitting: Acute Care

## 2022-03-19 DIAGNOSIS — Z122 Encounter for screening for malignant neoplasm of respiratory organs: Secondary | ICD-10-CM

## 2022-03-19 DIAGNOSIS — F17210 Nicotine dependence, cigarettes, uncomplicated: Secondary | ICD-10-CM

## 2022-03-24 ENCOUNTER — Ambulatory Visit: Payer: 59

## 2022-03-26 ENCOUNTER — Other Ambulatory Visit: Payer: Self-pay | Admitting: Family Medicine

## 2022-03-26 NOTE — Telephone Encounter (Signed)
Last office visit 10/13/21 for cervical radiculopathy.  Last refilled 11/27/21 for #60 with 3 refills.  No future appointments.

## 2022-04-30 ENCOUNTER — Encounter: Payer: Self-pay | Admitting: Family Medicine

## 2022-04-30 ENCOUNTER — Ambulatory Visit (INDEPENDENT_AMBULATORY_CARE_PROVIDER_SITE_OTHER): Payer: 59 | Admitting: Family Medicine

## 2022-04-30 VITALS — BP 120/70 | HR 73 | Temp 98.3°F | Ht 65.0 in | Wt 131.1 lb

## 2022-04-30 DIAGNOSIS — E119 Type 2 diabetes mellitus without complications: Secondary | ICD-10-CM

## 2022-04-30 DIAGNOSIS — M7989 Other specified soft tissue disorders: Secondary | ICD-10-CM | POA: Diagnosis not present

## 2022-04-30 DIAGNOSIS — L0390 Cellulitis, unspecified: Secondary | ICD-10-CM | POA: Diagnosis not present

## 2022-04-30 MED ORDER — CEPHALEXIN 500 MG PO CAPS: 500.0000 mg | ORAL_CAPSULE | Freq: Three times a day (TID) | ORAL | 0 refills | Status: DC

## 2022-04-30 MED ORDER — TRIAMCINOLONE ACETONIDE 0.5 % EX CREA: 1.0000 | TOPICAL_CREAM | Freq: Two times a day (BID) | CUTANEOUS | 0 refills | Status: DC

## 2022-04-30 MED ORDER — CEFTRIAXONE SODIUM 1 G IJ SOLR
1.0000 g | Freq: Once | INTRAMUSCULAR | Status: AC
  Administered 2022-04-30: 1 g via INTRAMUSCULAR

## 2022-04-30 NOTE — Progress Notes (Signed)
Patient ID: Isaiah Rangel, male    DOB: 1961/01/13, 61 y.o.   MRN: [id number]  This visit was conducted in person.  BP 120/70   Pulse 73   Temp 98.3 F (36.8 C) (Oral)   Ht 5\' 5"  (1.651 m)   Wt 131 lb 2 oz (59.5 kg)   SpO2 94%   BMI 21.82 kg/m    CC:  Chief Complaint  Patient presents with   Arm Swelling    Left X 1 week and very itchy    Subjective:   HPI: Isaiah Rangel is a 61 y.o. male patient of Dr. 68 presenting here with history of coronary artery disease, COPD, diabetes on 04/30/2022 for Arm Swelling (Left X 1 week and very itchy)  1 week ago noted left arm itching.. gradually worsening, Sore to touch,  swelling in left forearm  started 24 hours later now increasing to upper arm.   No fever, no flu like symptoms... itching is keeping him up at night. He did have a scratch on lower forarm earlier last week prior  to issue.  No pain in wrist or elbow.  No decreased ROM.   No injury, no bite.  He has been pciking grapes., outside some.  No numbness or tingling in hands ( he does have history of Raynaud's)  Has tried Shea butter, hydrocortisone cream.     Lab Results  Component Value Date   HGBA1C 6.2 (A) 09/15/2021    Relevant past medical, surgical, family and social history reviewed and updated as indicated. Interim medical history since our last visit reviewed. Allergies and medications reviewed and updated. Outpatient Medications Prior to Visit  Medication Sig Dispense Refill   albuterol (VENTOLIN HFA) 108 (90 Base) MCG/ACT inhaler Inhale 2 puffs into the lungs every 6 (six) hours as needed for wheezing or shortness of breath.     amLODipine (NORVASC) 2.5 MG tablet Take 1 tablet (2.5 mg total) by mouth daily. 30 tablet 0   aspirin EC 81 MG tablet Take 81 mg by mouth daily.     atorvastatin (LIPITOR) 80 MG tablet TAKE 1 TABLET BY MOUTH ONCE DAILY 90 tablet 2   benzonatate (TESSALON) 200 MG capsule Take 1 capsule (200 mg total) by mouth 2 (two)  times daily as needed for cough. 20 capsule 0   blood glucose meter kit and supplies KIT Dispense based on patient and insurance preference. Use up to two times daily as directed. 1 each 0   diclofenac (VOLTAREN) 75 MG EC tablet TAKE 1 TABLET(75 MG) BY MOUTH TWICE DAILY 60 tablet 3   escitalopram (LEXAPRO) 10 MG tablet TAKE 1 TABLET BY MOUTH  DAILY 90 tablet 3   ezetimibe (ZETIA) 10 MG tablet TAKE 1 TABLET BY MOUTH  DAILY 90 tablet 3   fludrocortisone (FLORINEF) 0.1 MG tablet TAKE 2 TABLETS BY MOUTH DAILY 180 tablet 3   Fluticasone-Umeclidin-Vilant (TRELEGY ELLIPTA) 100-62.5-25 MCG/ACT AEPB Inhale 1 puff into the lungs daily. 3 each 3   HYDROcodone-acetaminophen (NORCO/VICODIN) 5-325 MG tablet Take 1 tablet by mouth every 6 (six) hours as needed for moderate pain or severe pain. 20 tablet 0   ibuprofen (ADVIL) 200 MG tablet Take 200-800 mg by mouth every 6 (six) hours as needed for moderate pain.     ipratropium-albuterol (DUONEB) 0.5-2.5 (3) MG/3ML SOLN Take 3 mLs by nebulization every 6 (six) hours as needed. Take scheduled twice a day 360 mL 3   isosorbide mononitrate (IMDUR) 30 MG 24 hr  tablet TAKE 1 TABLET BY MOUTH DAILY 90 tablet 3   Multiple Vitamin (MULTIVITAMIN WITH MINERALS) TABS tablet Take 1 tablet by mouth daily.     nitroGLYCERIN (NITROSTAT) 0.4 MG SL tablet DISSOLVE 1 TABLET UNDER THE TONGUE EVERY 5 MINUTES AS NEEDED FOR CHEST PAIN 25 tablet 3   Omega-3 Fatty Acids (FISH OIL) 1200 MG CAPS Take 1,200 mg by mouth daily.     tiZANidine (ZANAFLEX) 4 MG tablet Take 1 tablet (4 mg total) by mouth 2 (two) times daily as needed for muscle spasms. Primarily at night, since daytime use will make drowsy 60 tablet 2   predniSONE (DELTASONE) 20 MG tablet 2 tabs po for 7 days, then 1 tab po for 7 days 21 tablet 0   No facility-administered medications prior to visit.     Per HPI unless specifically indicated in ROS section below Review of Systems  Constitutional:  Negative for fatigue and  fever.  HENT:  Negative for ear pain.   Eyes:  Negative for pain.  Respiratory:  Negative for cough and shortness of breath.   Cardiovascular:  Negative for chest pain, palpitations and leg swelling.  Gastrointestinal:  Negative for abdominal pain.  Genitourinary:  Negative for dysuria.  Musculoskeletal:  Negative for arthralgias.  Neurological:  Negative for syncope, light-headedness and headaches.  Psychiatric/Behavioral:  Negative for dysphoric mood.    Objective:  BP 120/70   Pulse 73   Temp 98.3 F (36.8 C) (Oral)   Ht $R'5\' 5"'vj$  (1.651 m)   Wt 131 lb 2 oz (59.5 kg)   SpO2 94%   BMI 21.82 kg/m   Wt Readings from Last 3 Encounters:  04/30/22 131 lb 2 oz (59.5 kg)  03/17/22 130 lb (59 kg)  10/13/21 131 lb 2 oz (59.5 kg)      Physical Exam Constitutional:      Appearance: He is well-developed.  HENT:     Head: Normocephalic.     Right Ear: Hearing normal.     Left Ear: Hearing normal.     Nose: Nose normal.  Neck:     Thyroid: No thyroid mass or thyromegaly.     Vascular: No carotid bruit.     Trachea: Trachea normal.  Cardiovascular:     Rate and Rhythm: Normal rate and regular rhythm.     Pulses: Normal pulses.     Heart sounds: Heart sounds not distant. No murmur heard.    No friction rub. No gallop.     Comments: No peripheral edema Pulmonary:     Effort: Pulmonary effort is normal. No respiratory distress.     Breath sounds: Normal breath sounds.  Skin:    General: Skin is warm and dry.     Findings: Rash present.  Psychiatric:        Speech: Speech normal.        Behavior: Behavior normal.        Thought Content: Thought content normal.          Results for orders placed or performed in visit on 10/08/21  Microscopic Examination   Urine  Result Value Ref Range   WBC, UA 0-5 0 - 5 /hpf   RBC, Urine 0-2 0 - 2 /hpf   Epithelial Cells (non renal) None seen 0 - 10 /hpf   Casts Present (A) None seen /lpf   Cast Type Granular casts (A) N/A   Crystals  Present (A) N/A   Crystal Type Amorphous Sediment N/A   Bacteria, UA  None seen None seen/Few  Urinalysis, Complete  Result Value Ref Range   Specific Gravity, UA 1.020 1.005 - 1.030   pH, UA 7.0 5.0 - 7.5   Color, UA Yellow Yellow   Appearance Ur Hazy (A) Clear   Leukocytes,UA Negative Negative   Protein,UA 1+ (A) Negative/Trace   Glucose, UA Negative Negative   Ketones, UA Negative Negative   RBC, UA Trace (A) Negative   Bilirubin, UA Negative Negative   Urobilinogen, Ur 0.2 0.2 - 1.0 mg/dL   Nitrite, UA Negative Negative   Microscopic Examination See below:      COVID 19 screen:  No recent travel or known exposure to COVID19 The patient denies respiratory symptoms of COVID 19 at this time. The importance of social distancing was discussed today.   Assessment and Plan    Problem List Items Addressed This Visit     Diabetes mellitus type 2, diet-controlled ([place]) (Chronic)   Acute cellulitis - Primary    Given diabetes will treat with ceftriaxone 1 g x 1.  Start on Keflex 3 times a day x7 days.  No clear concern for MRSA.  Close follow-up over the holiday weekend but ER and return precautions discussed in detail with patient.      Left arm swelling    Acute, most likely secondary to cellulitis introduced by dog scratch versus allergic reaction.  Will treat as cellulitis but also encouraged him to use topical steroid cream for itching as well as Zyrtec for possible allergic source.      Meds ordered this encounter  Medications   cephALEXin (KEFLEX) 500 MG capsule    Sig: Take 1 capsule (500 mg total) by mouth 3 (three) times daily.    Dispense:  21 capsule    Refill:  0   triamcinolone cream (KENALOG) 0.5 %    Sig: Apply 1 Application topically 2 (two) times daily.    Dispense:  30 g    Refill:  0   cefTRIAXone (ROCEPHIN) injection 1 g    Order Specific Question:   Antibiotic Indication:    Answer:   Cellulitis     Eliezer Lofts, MD

## 2022-04-30 NOTE — Patient Instructions (Addendum)
Start zyrtec at bedtime for allergies.  Start with topical steroid cream twice daily for itching.  Start  tomorrow and complete antibiotic x 7 days.

## 2022-04-30 NOTE — Assessment & Plan Note (Signed)
Given diabetes will treat with ceftriaxone 1 g x 1.  Start on Keflex 3 times a day x7 days.  No clear concern for MRSA.  Close follow-up over the holiday weekend but ER and return precautions discussed in detail with patient.

## 2022-04-30 NOTE — Assessment & Plan Note (Addendum)
Acute, most likely secondary to cellulitis introduced by dog scratch versus allergic reaction.  Will treat as cellulitis but also encouraged him to use topical steroid cream for itching as well as Zyrtec for possible allergic source.

## 2022-05-05 ENCOUNTER — Ambulatory Visit (INDEPENDENT_AMBULATORY_CARE_PROVIDER_SITE_OTHER): Payer: 59 | Admitting: Family Medicine

## 2022-05-05 ENCOUNTER — Encounter: Payer: Self-pay | Admitting: Family Medicine

## 2022-05-05 VITALS — BP 100/70 | HR 72 | Temp 98.3°F | Ht 65.0 in | Wt 128.4 lb

## 2022-05-05 DIAGNOSIS — L0390 Cellulitis, unspecified: Secondary | ICD-10-CM

## 2022-05-05 DIAGNOSIS — M7989 Other specified soft tissue disorders: Secondary | ICD-10-CM | POA: Diagnosis not present

## 2022-05-05 NOTE — Assessment & Plan Note (Signed)
Resolved.  Normal pulse and sensation in distal left extremity

## 2022-05-05 NOTE — Progress Notes (Signed)
Patient ID: Isaiah Rangel, male    DOB: 26-Aug-1961, 61 y.o.   MRN: [id number]  This visit was conducted in person.  BP 100/70   Pulse 72   Temp 98.3 F (36.8 C) (Oral)   Ht $R'5\' 5"'Bs$  (1.651 m)   Wt 128 lb 6 oz (58.2 kg)   SpO2 94%   BMI 21.36 kg/m    CC:  Chief Complaint  Patient presents with   Follow-up    Cellulitis on Left Arm    Subjective:   HPI: Isaiah Rangel is a 61 y.o. male presenting on 05/05/2022 for Follow-up (Cellulitis on Left Arm)  Left arm cellulitis: S/P rocephin injection on 05/01/2022.  Keflex x TID x 7 days.  Topical steroid and zyrtec to cover allergic  etiology.  Today he reports  significant improvement.Marland Kitchen almost complete resolution of pain  and redness.  Felt tired  the afternoon of the rocephin but otherwise tolerated it.  No  SE to keflex.  No fever.  Rash is improving, itching resolved.  No further redness spreading.  Skin at site rough and dry, healing scabs present from excoriation.       Relevant past medical, surgical, family and social history reviewed and updated as indicated. Interim medical history since our last visit reviewed. Allergies and medications reviewed and updated. Outpatient Medications Prior to Visit  Medication Sig Dispense Refill   albuterol (VENTOLIN HFA) 108 (90 Base) MCG/ACT inhaler Inhale 2 puffs into the lungs every 6 (six) hours as needed for wheezing or shortness of breath.     amLODipine (NORVASC) 2.5 MG tablet Take 1 tablet (2.5 mg total) by mouth daily. 30 tablet 0   aspirin EC 81 MG tablet Take 81 mg by mouth daily.     atorvastatin (LIPITOR) 80 MG tablet TAKE 1 TABLET BY MOUTH ONCE DAILY 90 tablet 2   benzonatate (TESSALON) 200 MG capsule Take 1 capsule (200 mg total) by mouth 2 (two) times daily as needed for cough. 20 capsule 0   blood glucose meter kit and supplies KIT Dispense based on patient and insurance preference. Use up to two times daily as directed. 1 each 0   cephALEXin (KEFLEX) 500 MG capsule  Take 1 capsule (500 mg total) by mouth 3 (three) times daily. 21 capsule 0   diclofenac (VOLTAREN) 75 MG EC tablet TAKE 1 TABLET(75 MG) BY MOUTH TWICE DAILY 60 tablet 3   escitalopram (LEXAPRO) 10 MG tablet TAKE 1 TABLET BY MOUTH  DAILY 90 tablet 3   ezetimibe (ZETIA) 10 MG tablet TAKE 1 TABLET BY MOUTH  DAILY 90 tablet 3   fludrocortisone (FLORINEF) 0.1 MG tablet TAKE 2 TABLETS BY MOUTH DAILY 180 tablet 3   Fluticasone-Umeclidin-Vilant (TRELEGY ELLIPTA) 100-62.5-25 MCG/ACT AEPB Inhale 1 puff into the lungs daily. 3 each 3   HYDROcodone-acetaminophen (NORCO/VICODIN) 5-325 MG tablet Take 1 tablet by mouth every 6 (six) hours as needed for moderate pain or severe pain. 20 tablet 0   ibuprofen (ADVIL) 200 MG tablet Take 200-800 mg by mouth every 6 (six) hours as needed for moderate pain.     ipratropium-albuterol (DUONEB) 0.5-2.5 (3) MG/3ML SOLN Take 3 mLs by nebulization every 6 (six) hours as needed. Take scheduled twice a day 360 mL 3   isosorbide mononitrate (IMDUR) 30 MG 24 hr tablet TAKE 1 TABLET BY MOUTH DAILY 90 tablet 3   Multiple Vitamin (MULTIVITAMIN WITH MINERALS) TABS tablet Take 1 tablet by mouth daily.     nitroGLYCERIN (  NITROSTAT) 0.4 MG SL tablet DISSOLVE 1 TABLET UNDER THE TONGUE EVERY 5 MINUTES AS NEEDED FOR CHEST PAIN 25 tablet 3   Omega-3 Fatty Acids (FISH OIL) 1200 MG CAPS Take 1,200 mg by mouth daily.     tiZANidine (ZANAFLEX) 4 MG tablet Take 1 tablet (4 mg total) by mouth 2 (two) times daily as needed for muscle spasms. Primarily at night, since daytime use will make drowsy 60 tablet 2   triamcinolone cream (KENALOG) 0.5 % Apply 1 Application topically 2 (two) times daily. 30 g 0   No facility-administered medications prior to visit.     Per HPI unless specifically indicated in ROS section below Review of Systems  Constitutional:  Negative for fatigue and fever.  HENT:  Negative for ear pain.   Eyes:  Negative for pain.  Respiratory:  Negative for cough and shortness of  breath.   Cardiovascular:  Negative for chest pain, palpitations and leg swelling.  Gastrointestinal:  Negative for abdominal pain.  Genitourinary:  Negative for dysuria.  Musculoskeletal:  Negative for arthralgias.  Neurological:  Negative for syncope, light-headedness and headaches.  Psychiatric/Behavioral:  Negative for dysphoric mood.    Objective:  BP 100/70   Pulse 72   Temp 98.3 F (36.8 C) (Oral)   Ht $R'5\' 5"'Iz$  (1.651 m)   Wt 128 lb 6 oz (58.2 kg)   SpO2 94%   BMI 21.36 kg/m   Wt Readings from Last 3 Encounters:  05/05/22 128 lb 6 oz (58.2 kg)  04/30/22 131 lb 2 oz (59.5 kg)  03/17/22 130 lb (59 kg)      Physical Exam Constitutional:      Appearance: He is well-developed.  HENT:     Head: Normocephalic.     Right Ear: Hearing normal.     Left Ear: Hearing normal.     Nose: Nose normal.  Neck:     Thyroid: No thyroid mass or thyromegaly.     Vascular: No carotid bruit.     Trachea: Trachea normal.  Cardiovascular:     Rate and Rhythm: Normal rate and regular rhythm.     Pulses: Normal pulses.     Heart sounds: Heart sounds not distant. No murmur heard.    No friction rub. No gallop.     Comments: No peripheral edema Pulmonary:     Effort: Pulmonary effort is normal. No respiratory distress.     Breath sounds: Normal breath sounds.  Skin:    General: Skin is warm and dry.     Findings: No rash.  Psychiatric:        Speech: Speech normal.        Behavior: Behavior normal.        Thought Content: Thought content normal.    Left arm    Results for orders placed or performed in visit on 10/08/21  Microscopic Examination   Urine  Result Value Ref Range   WBC, UA 0-5 0 - 5 /hpf   RBC, Urine 0-2 0 - 2 /hpf   Epithelial Cells (non renal) None seen 0 - 10 /hpf   Casts Present (A) None seen /lpf   Cast Type Granular casts (A) N/A   Crystals Present (A) N/A   Crystal Type Amorphous Sediment N/A   Bacteria, UA None seen None seen/Few  Urinalysis, Complete   Result Value Ref Range   Specific Gravity, UA 1.020 1.005 - 1.030   pH, UA 7.0 5.0 - 7.5   Color, UA Yellow Yellow  Appearance Ur Hazy (A) Clear   Leukocytes,UA Negative Negative   Protein,UA 1+ (A) Negative/Trace   Glucose, UA Negative Negative   Ketones, UA Negative Negative   RBC, UA Trace (A) Negative   Bilirubin, UA Negative Negative   Urobilinogen, Ur 0.2 0.2 - 1.0 mg/dL   Nitrite, UA Negative Negative   Microscopic Examination See below:      COVID 19 screen:  No recent travel or known exposure to COVID19 The patient denies respiratory symptoms of COVID 19 at this time. The importance of social distancing was discussed today.   Assessment and Plan    Problem List Items Addressed This Visit     Acute cellulitis - Primary    Acute, significant improvement of cellulitis with almost 100% clearing of redness.  Complete course of Keflex 3 times daily for 7 days.  Can continue to use topical triamcinolone twice daily as needed for itching and dry skin.      Left arm swelling    Resolved.  Normal pulse and sensation in distal left extremity        Eliezer Lofts, MD

## 2022-05-05 NOTE — Assessment & Plan Note (Signed)
Acute, significant improvement of cellulitis with almost 100% clearing of redness.  Complete course of Keflex 3 times daily for 7 days.  Can continue to use topical triamcinolone twice daily as needed for itching and dry skin.

## 2022-07-15 NOTE — Progress Notes (Unsigned)
    Yeraldin Litzenberger T. Jurline Folger, MD, [place] at [facility] [place], [address]  Phone: [phone]  FAX: [place] - 61 y.o. male  MRN [id number]  Date of Birth: [birth year]  Date: 07/16/2022  PCP: Owens Loffler, MD  Referral: Owens Loffler, MD  No chief complaint on file.  Virtual Visit via Video Note:  I connected with  Isaiah Rangel on 07/16/2022 10:20 AM EST by a video enabled telemedicine application and verified that I am speaking with the correct person using two identifiers.   Location patient: home computer, tablet, or smartphone Location provider: work or home office Consent: Verbal consent directly obtained from Isaiah Rangel. Persons participating in the virtual visit: patient, provider  I discussed the limitations of evaluation and management by telemedicine and the availability of in person appointments. The patient expressed understanding and agreed to proceed.  No chief complaint on file.   History of Present Illness:  He presents with acute COVID-19, already diagnosed by self test.    Review of Systems as above: See pertinent positives and pertinent negatives per HPI No acute distress verbally   Observations/Objective/Exam:  An attempt was made to discern vital signs over the phone and per patient if applicable and possible.   General:    Alert, Oriented, appears well and in no acute distress  Pulmonary:     On inspection no signs of respiratory distress.  Psych / Neurological:     Pleasant and cooperative.  Assessment and Plan:  No diagnosis found.   I discussed the assessment and treatment plan with the patient. The patient was provided an opportunity to ask questions and all were answered. The patient agreed with the plan and demonstrated an understanding of the instructions.   The patient was advised to call back or seek an in-person evaluation if the  symptoms worsen or if the condition fails to improve as anticipated.  Follow-up: prn unless noted otherwise below No follow-ups on file.  No orders of the defined types were placed in this encounter.  No orders of the defined types were placed in this encounter.   Signed,  Maud Deed. Dewel Lotter, MD

## 2022-07-16 ENCOUNTER — Encounter: Payer: Self-pay | Admitting: Family Medicine

## 2022-07-16 ENCOUNTER — Telehealth (INDEPENDENT_AMBULATORY_CARE_PROVIDER_SITE_OTHER): Payer: 59 | Admitting: Family Medicine

## 2022-07-16 VITALS — Ht 65.0 in

## 2022-07-16 DIAGNOSIS — J441 Chronic obstructive pulmonary disease with (acute) exacerbation: Secondary | ICD-10-CM | POA: Diagnosis not present

## 2022-07-16 DIAGNOSIS — U071 COVID-19: Secondary | ICD-10-CM | POA: Diagnosis not present

## 2022-07-16 MED ORDER — MOLNUPIRAVIR EUA 200MG CAPSULE: 4.0000 | ORAL_CAPSULE | Freq: Two times a day (BID) | ORAL | 0 refills | Status: AC

## 2022-07-16 MED ORDER — PREDNISONE 20 MG PO TABS: ORAL_TABLET | ORAL | 0 refills | Status: DC

## 2022-08-07 ENCOUNTER — Other Ambulatory Visit: Payer: Self-pay | Admitting: Family Medicine

## 2022-08-13 ENCOUNTER — Other Ambulatory Visit: Payer: Self-pay | Admitting: Family Medicine

## 2022-08-17 NOTE — Telephone Encounter (Signed)
Last office visit 07/16/22 (video) for Covid 19.  Last refilled 03/26/22 for #60 with 3 refills.  No future appointments.

## 2022-08-27 ENCOUNTER — Ambulatory Visit (INDEPENDENT_AMBULATORY_CARE_PROVIDER_SITE_OTHER)
Admission: RE | Admit: 2022-08-27 | Discharge: 2022-08-27 | Disposition: A | Discharge status: Home / Self Care | Payer: 59 | Source: Ambulatory Visit | Attending: Family Medicine | Admitting: Family Medicine

## 2022-08-27 ENCOUNTER — Ambulatory Visit (INDEPENDENT_AMBULATORY_CARE_PROVIDER_SITE_OTHER): Payer: 59 | Admitting: Family Medicine

## 2022-08-27 ENCOUNTER — Encounter: Payer: Self-pay | Admitting: Family Medicine

## 2022-08-27 VITALS — BP 110/70 | HR 78 | Temp 98.6°F | Ht 65.0 in | Wt 127.0 lb

## 2022-08-27 DIAGNOSIS — M5416 Radiculopathy, lumbar region: Secondary | ICD-10-CM

## 2022-08-27 DIAGNOSIS — E119 Type 2 diabetes mellitus without complications: Secondary | ICD-10-CM

## 2022-08-27 DIAGNOSIS — M5412 Radiculopathy, cervical region: Secondary | ICD-10-CM

## 2022-08-27 LAB — POCT GLYCOSYLATED HEMOGLOBIN (HGB A1C): Hemoglobin A1C: 6.2 % — AB (ref 4.0–5.6)

## 2022-08-27 MED ORDER — PREDNISONE 20 MG PO TABS: ORAL_TABLET | ORAL | 0 refills | Status: DC

## 2022-08-27 MED ORDER — TIZANIDINE HCL 4 MG PO TABS: 4.0000 mg | ORAL_TABLET | Freq: Two times a day (BID) | ORAL | 2 refills | Status: AC | PRN

## 2022-08-27 NOTE — Progress Notes (Signed)
Raistlin Gum T. Juston Goheen, MD, [place] at [facility] [place], [address]  Phone: [phone]  FAX: [place] - 61 y.o. male  MRN [id number]  Date of Birth: 1961/07/04  Date: 08/27/2022  PCP: Owens Loffler, MD  Referral: Owens Loffler, MD  Chief Complaint  Patient presents with   Back Pain    Lower Back-Pinched Nerve and goes down both legs   Fall   Subjective:   LAKSH HINNERS is a 61 y.o. very pleasant male patient with Body mass index is 21.13 kg/m. who presents with the following:  He is here to talk about his back.  He has been plagued with neck problems over the last year with radicular symptoms and some weakness in his hand.  He most recently saw neurosurgery, and he also has had some epidural steroid injections done by Dr. Santa Lighter.  He previously saw Dr. Cari Caraway from neurosurgery at [facility] clinic.  He felt like all of his symptoms were secondary to cervical myelopathy.  Now he is having lumbar radiculopathy -   Centrally in the low back, but has been having pain in the low back, and it will go down to his knees.  Has been ongoing for two months.  If he is off his feet for a while, then came back with he goes back to worse.   Stood up and put some weight in his L leg, and his leg gave out.  30 years ago had low back surgery.   He does not have any bowel or bladder incontinence.  No focal weakness.  No saddle anesthesia.  Review of Systems is noted in the HPI, as appropriate  Objective:   BP 110/70   Pulse 78   Temp 98.6 F (37 C) (Oral)   Ht 5' 5" (1.651 m)   Wt 127 lb (57.6 kg)   SpO2 96%   BMI 21.13 kg/m    Range of motion at  the waist: Flexion, extension, lateral bending and rotation: Forward flexion to 80 degrees.  Mild pain with extension.  Lateral bending and rotation maneuvers are preserved.  No echymosis or edema Rises to examination table with mild  difficulty Gait: minimally antalgic  Inspection/Deformity: N Paraspinus Tenderness: Mild tenderness from L3-S1 bilaterally  B Ankle Dorsiflexion (L5,4): 5/5 B Great Toe Dorsiflexion (L5,4): 5/5 Heel Walk (L5): WNL Toe Walk (S1): WNL Rise/Squat (L4): WNL, mild pain  SENSORY B Medial Foot (L4): WNL B Dorsum (L5): WNL B Lateral (S1): WNL Light Touch: WNL Pinprick: WNL  B SLR, seated: neg B SLR, supine: neg B FABER: neg B Reverse FABER: neg B Greater Troch: NT B Log Roll: neg B Sciatic Notch: NT   Laboratory and Imaging Data:  Assessment and Plan:     ICD-10-CM   1. Lumbar back pain with radiculopathy affecting left lower extremity  M54.16 DG Lumbar Spine Complete    2. Cervical radiculopathy, acute  M54.12     3. Diabetes mellitus type 2, diet-controlled (HCC)  E11.9 POCT glycosylated hemoglobin (Hb A1C)     Acute on chronic back degenerative disc disease with exacerbation and new onset lumbar radiculopathy on the left.  Thankfully, the troubles of his neck have improved mostly.  I would anticipate he had had a small degree of HNP.  14 days of prednisone, Zanaflex as needed.  Medication Management during today's office visit: Meds ordered this encounter  Medications   tiZANidine (ZANAFLEX) 4 MG  tablet    Sig: Take 1 tablet (4 mg total) by mouth 2 (two) times daily as needed for muscle spasms. Primarily at night, since daytime use will make drowsy    Dispense:  60 tablet    Refill:  2   predniSONE (DELTASONE) 20 MG tablet    Sig: 2 tabs po for 7 days, then 1 tab po for 7 days    Dispense:  21 tablet    Refill:  0   Medications Discontinued During This Encounter  Medication Reason   Fluticasone-Umeclidin-Vilant (TRELEGY ELLIPTA) 100-62.5-25 MCG/ACT AEPB Cost of medication   HYDROcodone-acetaminophen (NORCO/VICODIN) 5-325 MG tablet No longer needed (for PRN medications)   predniSONE (DELTASONE) 20 MG tablet Completed Course   tiZANidine (ZANAFLEX) 4 MG tablet  Reorder    Orders placed today for conditions managed today: Orders Placed This Encounter  Procedures   DG Lumbar Spine Complete   POCT glycosylated hemoglobin (Hb A1C)    Disposition: No follow-ups on file.  [employer] One speech-to-text software was used for transcription in this dictation.  Possible transcriptional errors can occur using Editor, commissioning.   Signed,  Maud Deed. Dhillon Comunale, MD   Outpatient Encounter Medications as of 08/27/2022  Medication Sig   albuterol (VENTOLIN HFA) 108 (90 Base) MCG/ACT inhaler Inhale 2 puffs into the lungs every 6 (six) hours as needed for wheezing or shortness of breath.   amLODipine (NORVASC) 2.5 MG tablet Take 1 tablet (2.5 mg total) by mouth daily.   aspirin EC 81 MG tablet Take 81 mg by mouth daily.   atorvastatin (LIPITOR) 80 MG tablet TAKE 1 TABLET BY MOUTH ONCE DAILY   blood glucose meter kit and supplies KIT Dispense based on patient and insurance preference. Use up to two times daily as directed.   diclofenac (VOLTAREN) 75 MG EC tablet TAKE 1 TABLET(75 MG) BY MOUTH TWICE DAILY   escitalopram (LEXAPRO) 10 MG tablet TAKE 1 TABLET BY MOUTH  DAILY   ezetimibe (ZETIA) 10 MG tablet TAKE 1 TABLET BY MOUTH  DAILY   fludrocortisone (FLORINEF) 0.1 MG tablet TAKE 2 TABLETS BY MOUTH DAILY   ibuprofen (ADVIL) 200 MG tablet Take 200-800 mg by mouth every 6 (six) hours as needed for moderate pain.   ipratropium-albuterol (DUONEB) 0.5-2.5 (3) MG/3ML SOLN Take 3 mLs by nebulization every 6 (six) hours as needed. Take scheduled twice a day   isosorbide mononitrate (IMDUR) 30 MG 24 hr tablet TAKE 1 TABLET BY MOUTH DAILY   Multiple Vitamin (MULTIVITAMIN WITH MINERALS) TABS tablet Take 1 tablet by mouth daily.   nitroGLYCERIN (NITROSTAT) 0.4 MG SL tablet DISSOLVE 1 TABLET UNDER THE TONGUE EVERY 5 MINUTES AS NEEDED FOR CHEST PAIN   Omega-3 Fatty Acids (FISH OIL) 1200 MG CAPS Take 1,200 mg by mouth daily.   predniSONE (DELTASONE) 20 MG tablet 2 tabs po  for 7 days, then 1 tab po for 7 days   [DISCONTINUED] tiZANidine (ZANAFLEX) 4 MG tablet Take 1 tablet (4 mg total) by mouth 2 (two) times daily as needed for muscle spasms. Primarily at night, since daytime use will make drowsy   tiZANidine (ZANAFLEX) 4 MG tablet Take 1 tablet (4 mg total) by mouth 2 (two) times daily as needed for muscle spasms. Primarily at night, since daytime use will make drowsy   [DISCONTINUED] Fluticasone-Umeclidin-Vilant (TRELEGY ELLIPTA) 100-62.5-25 MCG/ACT AEPB Inhale 1 puff into the lungs daily. (Patient not taking: Reported on 07/16/2022)   [DISCONTINUED] HYDROcodone-acetaminophen (NORCO/VICODIN) 5-325 MG tablet Take 1 tablet by mouth  every 6 (six) hours as needed for moderate pain or severe pain.   [DISCONTINUED] predniSONE (DELTASONE) 20 MG tablet 2 tabs po daily for 5 days, then 1 tab po daily for 5 days   No facility-administered encounter medications on file as of 08/27/2022.    

## 2022-09-10 ENCOUNTER — Other Ambulatory Visit: Payer: Self-pay | Admitting: Family Medicine

## 2022-09-10 DIAGNOSIS — Z125 Encounter for screening for malignant neoplasm of prostate: Secondary | ICD-10-CM

## 2022-09-10 DIAGNOSIS — E119 Type 2 diabetes mellitus without complications: Secondary | ICD-10-CM

## 2022-09-10 DIAGNOSIS — Z79899 Other long term (current) drug therapy: Secondary | ICD-10-CM

## 2022-09-10 DIAGNOSIS — E782 Mixed hyperlipidemia: Secondary | ICD-10-CM

## 2022-09-10 NOTE — Telephone Encounter (Signed)
Please schedule CPE with fasting labs prior with Dr. [name].  

## 2022-09-10 NOTE — Telephone Encounter (Signed)
Spoke to pt, scheduled cpe for 09/21/22

## 2022-09-15 ENCOUNTER — Other Ambulatory Visit (INDEPENDENT_AMBULATORY_CARE_PROVIDER_SITE_OTHER): Payer: Managed Care, Other (non HMO)

## 2022-09-15 DIAGNOSIS — Z79899 Other long term (current) drug therapy: Secondary | ICD-10-CM

## 2022-09-15 DIAGNOSIS — E119 Type 2 diabetes mellitus without complications: Secondary | ICD-10-CM | POA: Diagnosis not present

## 2022-09-15 DIAGNOSIS — Z125 Encounter for screening for malignant neoplasm of prostate: Secondary | ICD-10-CM | POA: Diagnosis not present

## 2022-09-15 DIAGNOSIS — E782 Mixed hyperlipidemia: Secondary | ICD-10-CM

## 2022-09-15 LAB — LIPID PANEL
Cholesterol: 120 mg/dL (ref 0–200)
HDL: 40.7 mg/dL (ref >39.00)
LDL Cholesterol: 63 mg/dL (ref 0–99)
NonHDL: 79.24
Total CHOL/HDL Ratio: 3
Triglycerides: 82 mg/dL (ref 0.0–149.0)
VLDL: 16.4 mg/dL (ref 0.0–40.0)

## 2022-09-15 LAB — BASIC METABOLIC PANEL
BUN: 18 mg/dL (ref 6–23)
CO2: 32 mEq/L (ref 19–32)
Calcium: 9.2 mg/dL (ref 8.4–10.5)
Chloride: 104 mEq/L (ref 96–112)
Creatinine, Ser: 1.04 mg/dL (ref 0.40–1.50)
GFR: 76.99 mL/min (ref >60.00)
Glucose, Bld: 129 mg/dL — ABNORMAL HIGH (ref 70–99)
Potassium: 4.2 mEq/L (ref 3.5–5.1)
Sodium: 141 mEq/L (ref 135–145)

## 2022-09-15 LAB — CBC WITH DIFFERENTIAL/PLATELET
Basophils Absolute: 0.3 10*3/uL — ABNORMAL HIGH (ref 0.0–0.1)
Basophils Relative: 2.9 % (ref 0.0–3.0)
Eosinophils Absolute: 0.3 10*3/uL (ref 0.0–0.7)
Eosinophils Relative: 3.5 % (ref 0.0–5.0)
HCT: 41.1 % (ref 39.0–52.0)
Hemoglobin: 13.9 g/dL (ref 13.0–17.0)
Lymphocytes Relative: 27.6 % (ref 12.0–46.0)
Lymphs Abs: 2.5 10*3/uL (ref 0.7–4.0)
MCHC: 33.9 g/dL (ref 30.0–36.0)
MCV: 95.2 fl (ref 78.0–100.0)
Monocytes Absolute: 0.8 10*3/uL (ref 0.1–1.0)
Monocytes Relative: 8.6 % (ref 3.0–12.0)
Neutro Abs: 5.1 10*3/uL (ref 1.4–7.7)
Neutrophils Relative %: 57.4 % (ref 43.0–77.0)
Platelets: 220 10*3/uL (ref 150.0–400.0)
RBC: 4.32 Mil/uL (ref 4.22–5.81)
RDW: 18.8 % — ABNORMAL HIGH (ref 11.5–15.5)
WBC: 8.9 10*3/uL (ref 4.0–10.5)

## 2022-09-15 LAB — MICROALBUMIN / CREATININE URINE RATIO
Creatinine,U: 106.2 mg/dL
Microalb Creat Ratio: 3.7 mg/g (ref 0.0–30.0)
Microalb, Ur: 4 mg/dL — ABNORMAL HIGH (ref 0.0–1.9)

## 2022-09-15 LAB — HEPATIC FUNCTION PANEL
ALT: 24 U/L (ref 0–53)
AST: 17 U/L (ref 0–37)
Albumin: 4.1 g/dL (ref 3.5–5.2)
Alkaline Phosphatase: 76 U/L (ref 39–117)
Bilirubin, Direct: 0.1 mg/dL (ref 0.0–0.3)
Total Bilirubin: 0.3 mg/dL (ref 0.2–1.2)
Total Protein: 6.1 g/dL (ref 6.0–8.3)

## 2022-09-17 LAB — PSA, TOTAL WITH REFLEX TO PSA, FREE: PSA, Total: 0.9 ng/mL (ref <=4.0)

## 2022-09-20 NOTE — Progress Notes (Signed)
Aneira Cavitt T. Stashia Sia, MD, [place] at [facility] [place], [address]  Phone: [phone]  FAX: [place] - 62 y.o. male  MRN [id number]  Date of Birth: 09/12/1960  Date: 09/21/2022  PCP: Owens Loffler, MD  Referral: Owens Loffler, MD  Chief Complaint  Patient presents with   Annual Exam   Patient Care Team: Owens Loffler, MD as PCP - General Rockey Situ Kathlene November, MD as Consulting Physician (Cardiology) Subjective:   Isaiah Rangel is a 62 y.o. pleasant patient who presents with the following:  Preventative Health Maintenance Visit:  Health Maintenance Summary Reviewed and updated, unless pt declines services.  Tobacco History Reviewed.  Smokes a pack a day.  Alcohol: No concerns, no excessive use Exercise Habits: Some activity, rec at least 30 mins 5 times a week STD concerns: no risk or activity to increase risk Drug Use: None  Well-known patient with a history of cerebrovascular disease, coronary disease, history of STEMI, COPD as well as diabetes.  Does have COPD, he does have shortness of breath at baseline.  When he does have some shortness of breath, he will use his albuterol nebulizer solution.  He has been on a daily inhaler before, but he discontinued it secondary to financial constraint.  While he does have diabetes, his blood sugars have been completely controlled off of medications, and he is not currently taking anything.  Diabetes Mellitus: Tolerating Medications: n/a Compliance with diet: fair, Body mass index is 21.15 kg/m. Exercise: minimal / intermittent Avg blood sugars at home: not checking Foot problems: none Hypoglycemia: none No nausea, vomitting, blurred vision, polyuria.  Lab Results  Component Value Date   HGBA1C 6.2 (A) 08/27/2022   HGBA1C 6.2 (A) 09/15/2021   HGBA1C 6.6 (H) 01/29/2021   Lab Results  Component Value Date   MICROALBUR 4.0 (H)  09/15/2022   LDLCALC 63 09/15/2022   CREATININE 1.04 09/15/2022    Wt Readings from Last 3 Encounters:  09/21/22 127 lb 2 oz (57.7 kg)  08/27/22 127 lb (57.6 kg)  05/05/22 128 lb 6 oz (58.2 kg)    Lipids: Doing well, stable. Tolerating meds fine with no SE. Panel reviewed with patient.  Lipids: Lab Results  Component Value Date   CHOL 120 09/15/2022   Lab Results  Component Value Date   HDL 40.70 09/15/2022   Lab Results  Component Value Date   LDLCALC 63 09/15/2022   Lab Results  Component Value Date   TRIG 82.0 09/15/2022   Lab Results  Component Value Date   CHOLHDL 3 09/15/2022    Lab Results  Component Value Date   ALT 24 09/15/2022   AST 17 09/15/2022   ALKPHOS 76 09/15/2022   BILITOT 0.3 09/15/2022     Health Maintenance  Topic Date Due   FOOT EXAM  Never done   COVID-19 Vaccine (3 - Moderna risk series) 02/09/2020   INFLUENZA VACCINE  11/29/2022 (Originally 03/31/2022)   HEMOGLOBIN A1C  02/26/2023   Lung Cancer Screening  03/18/2023   OPHTHALMOLOGY EXAM  04/18/2023   Diabetic kidney evaluation - eGFR measurement  09/16/2023   Diabetic kidney evaluation - Urine ACR  09/16/2023   COLONOSCOPY (Pts 45-15yr Insurance coverage will need to be confirmed)  12/13/2023   DTaP/Tdap/Td (2 - Td or Tdap) 06/22/2026   Hepatitis C Screening  Completed   HIV Screening  Completed   Zoster Vaccines- Shingrix  Completed   HPV  VACCINES  Aged Out   Immunization History  Administered Date(s) Administered   Influenza Inj Mdck Quad Pf 10/09/2019   Influenza Whole 07/09/2010   Influenza, Seasonal, Injecte, Preservative Fre 06/18/2015   Influenza,inj,Quad PF,6+ Mos 06/22/2016, 06/13/2020   Moderna Sars-Covid-2 Vaccination 12/15/2019, 01/12/2020   Pneumococcal Polysaccharide-23 10/09/2019   Tdap 06/22/2016   Zoster Recombinat (Shingrix) 02/05/2021, 09/15/2021   Patient Active Problem List   Diagnosis Date Noted   NSTEMI (non-ST elevated myocardial infarction) ([place])  03/20/2019    Priority: High   Coronary artery disease involving native coronary artery of native heart without angina pectoris 02/21/2016    Priority: High   Cerebrovascular disease (but no CVA) 04/29/2015    Priority: High   Diabetes mellitus type 2, diet-controlled ([place]) 02/05/2021    Priority: Medium    COPD (chronic obstructive pulmonary disease) with chronic bronchitis 09/05/2018    Priority: Medium    S/P cardiac catheterization 04/19/2013    Priority: Medium    Smoking 09/09/2011    Priority: Medium    Mixed hyperlipidemia 04/09/2010    Priority: Medium    Nephrolithiasis 06/13/2020   Depression    Acute ischemic enteritis ([place]) 11/26/2017   GERD 09/21/2008   ERECTILE DYSFUNCTION 09/20/2008    Past Medical History:  Diagnosis Date   BPH (benign prostatic hyperplasia)    COPD (chronic obstructive pulmonary disease) ([place])    Coronary artery disease/Coronary Vasospasm    a. 03/2013 Cath: min irregs. EF >55%; b. 03/2019 Cath: LM nl, LAD nl, LCX nl, OM1 40, RCA 30p, 28m->40% after IC NTG; c. 12/2019 MV: EF 65%, no ischemia/infarct-->low risk.   Diabetes mellitus type 2, diet-controlled ([place] 076/16/0737  Diastolic dysfunction    a. 03/2019 Echo: EF 50-55%, nl RV size/fxn. Trace MR/TR; b. 09/2019 Echo: EF 60-65%, mild LVH, GrI DD, no rwma, triv MR, mild AoV sclerosis w/o stenosis.   GAD (generalized anxiety disorder)    GERD (gastroesophageal reflux disease)    History of kidney stones    Hyperlipidemia    Myocardial infarction ([place] 03/2019   NO STENTS   Skin cancer    basal and squamous   Tobacco abuse     Past Surgical History:  Procedure Laterality Date   BACK SURGERY  1998   NO METAL   CARDIAC CATHETERIZATION  04/06/13   [facility]- minor luminal irregularities, otherwise normal cors; EF > 55%. Medical management and smoking cessation recommended   CATARACT EXTRACTION W/PHACO Right 08/28/2020   Procedure: CATARACT EXTRACTION PHACO AND INTRAOCULAR LENS PLACEMENT ([place]  RIGHT ETyler Deis  Surgeon: BLeandrew Koyanagi MD;  [place]  Service: Ophthalmology;  Laterality: Right;  1.77 0:31.9 5.6%   CYSTOSCOPY/URETEROSCOPY/HOLMIUM LASER/STENT PLACEMENT Left 06/13/2020   Procedure: CYSTOSCOPY/URETEROSCOPY//STENT PLACEMENT;  Surgeon: BHollice Espy MD;  Location: [facility];  Service: Urology;  Laterality: Left;   CYSTOSCOPY/URETEROSCOPY/HOLMIUM LASER/STENT PLACEMENT Left 06/24/2020   Procedure: CYSTOSCOPY/URETEROSCOPY/HOLMIUM LASER/STENT PLACEMENT;  Surgeon: BHollice Espy MD;  Location: [facility];  Service: Urology;  Laterality: Left;   KNEE ARTHROSCOPY W/ PARTIAL MEDIAL MENISCECTOMY Right 2012   [place], 2012   KNEE ARTHROSCOPY WITH MEDIAL MENISECTOMY Left 11/13/2020   Procedure: Left knee arthroscopy with partial medial or lateral menisectomy, possible chondroplasty, possible partial synovectomy;  Surgeon: BLovell Sheehan MD;  Location: [facility];  Service: Orthopedics;  Laterality: Left;   LEFT HEART CATH AND CORONARY ANGIOGRAPHY N/A 03/20/2019   Procedure: LEFT HEART CATH AND CORONARY ANGIOGRAPHY;  Surgeon: KTroy Sine MD;  [facility];  Service: Cardiovascular;  Laterality: N/A;   NASAL SINUS SURGERY     neck fusion  2000   TONSILLECTOMY     VASECTOMY      Family History  Problem Relation Age of Onset   Arthritis Father    Coronary artery disease Father    Hypertension Father    CVA Father 71   Colon cancer Mother    Heart attack Brother    Heart attack Brother    Coronary artery disease Brother    Esophageal cancer Neg Hx    Stomach cancer Neg Hx    Rectal cancer Neg Hx     Social History   Social History Narrative   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  Review of Systems: Pertinent positives  are listed above.  Otherwise, a full 14 point review of systems has been done in full and it is negative except where it is noted positive.  Objective:   BP 110/68   Pulse 76   Temp 98 F (36.7 C) (Oral)   Ht '5\' 5"'$  (1.651 m)   Wt 127 lb 2 oz (57.7 kg)   SpO2 94%   BMI 21.15 kg/m  Ideal Body Weight: Weight in (lb) to have BMI = 25: 149.9  Ideal Body Weight: Weight in (lb) to have BMI = 25: 149.9 No results found.    09/21/2022    9:07 AM 02/05/2021    2:35 PM 10/09/2019    3:34 PM 09/05/2018    9:50 AM 04/07/2017   12:43 PM  Depression screen PHQ 2/9  Decreased Interest 1 0 0 0 2  Down, Depressed, Hopeless 0 0 0 0 1  PHQ - 2 Score 1 0 0 0 3  Altered sleeping  0   1  Tired, decreased energy  1   1  Change in appetite  1   1  Feeling bad or failure about yourself   0   0  Trouble concentrating  0   1  Moving slowly or fidgety/restless  0   0  Suicidal thoughts  0   0  PHQ-9 Score  2   7  Difficult doing work/chores  Not difficult at all   Not difficult at all     GEN: well developed, well nourished, no acute distress Eyes: conjunctiva and lids normal, PERRLA, EOMI ENT: TM clear, nares clear, oral exam WNL Neck: supple, no lymphadenopathy, no thyromegaly, no JVD Pulm: clear to auscultation and percussion, respiratory effort normal CV: regular rate and rhythm, S1-S2, no murmur, rub or gallop, no bruits, peripheral pulses normal and symmetric, no cyanosis, clubbing, edema or varicosities GI: soft, non-tender; no hepatosplenomegaly, masses; active bowel sounds all quadrants GU: deferred Lymph: no cervical, axillary or inguinal adenopathy MSK: gait normal, muscle tone and strength WNL, no joint swelling, effusions, discoloration, crepitus  SKIN: clear, good turgor, color WNL, no rashes, lesions, or ulcerations Neuro: normal mental status, normal strength, sensation, and motion Psych: alert; oriented to person, place and time, normally interactive and not anxious or depressed in  appearance.  All labs reviewed with patient. Results for orders placed or performed in visit on 09/15/22  PSA, Total with Reflex to PSA, Free  Result Value Ref Range   PSA, Total 0.9 <  OR = 4.0 ng/mL  Lipid panel  Result Value Ref Range   Cholesterol 120 0 - 200 mg/dL   Triglycerides 82.0 0.0 - 149.0 mg/dL   HDL 40.70 >39.00 mg/dL   VLDL 16.4 0.0 - 40.0 mg/dL   LDL Cholesterol 63 0 - 99 mg/dL   Total CHOL/HDL Ratio 3    NonHDL 79.24   Microalbumin / creatinine urine ratio  Result Value Ref Range   Microalb, Ur 4.0 (H) 0.0 - 1.9 mg/dL   Creatinine,U 106.2 mg/dL   Microalb Creat Ratio 3.7 0.0 - 30.0 mg/g  Hepatic function panel  Result Value Ref Range   Total Bilirubin 0.3 0.2 - 1.2 mg/dL   Bilirubin, Direct 0.1 0.0 - 0.3 mg/dL   Alkaline Phosphatase 76 39 - 117 U/L   AST 17 0 - 37 U/L   ALT 24 0 - 53 U/L   Total Protein 6.1 6.0 - 8.3 g/dL   Albumin 4.1 3.5 - 5.2 g/dL  CBC with Differential/Platelet  Result Value Ref Range   WBC 8.9 4.0 - 10.5 K/uL   RBC 4.32 4.22 - 5.81 Mil/uL   Hemoglobin 13.9 13.0 - 17.0 g/dL   HCT 41.1 39.0 - 52.0 %   MCV 95.2 78.0 - 100.0 fl   MCHC 33.9 30.0 - 36.0 g/dL   RDW 18.8 (H) 11.5 - 15.5 %   Platelets 220.0 150.0 - 400.0 K/uL   Neutrophils Relative % 57.4 43.0 - 77.0 %   Lymphocytes Relative 27.6 12.0 - 46.0 %   Monocytes Relative 8.6 3.0 - 12.0 %   Eosinophils Relative 3.5 0.0 - 5.0 %   Basophils Relative 2.9 0.0 - 3.0 %   Neutro Abs 5.1 1.4 - 7.7 K/uL   Lymphs Abs 2.5 0.7 - 4.0 K/uL   Monocytes Absolute 0.8 0.1 - 1.0 K/uL   Eosinophils Absolute 0.3 0.0 - 0.7 K/uL   Basophils Absolute 0.3 (H) 0.0 - 0.1 K/uL  Basic metabolic panel  Result Value Ref Range   Sodium 141 135 - 145 mEq/L   Potassium 4.2 3.5 - 5.1 mEq/L   Chloride 104 96 - 112 mEq/L   CO2 32 19 - 32 mEq/L   Glucose, Bld 129 (H) 70 - 99 mg/dL   BUN 18 6 - 23 mg/dL   Creatinine, Ser 1.04 0.40 - 1.50 mg/dL   GFR 76.99 >60.00 mL/min   Calcium 9.2 8.4 - 10.5 mg/dL     Assessment and Plan:     ICD-10-CM   1. Healthcare maintenance  Z00.00     2. Hyperlipidemia LDL goal <70  E78.5 ezetimibe (ZETIA) 10 MG tablet    3. Orthostatic hypotension  I95.1 fludrocortisone (FLORINEF) 0.1 MG tablet    4. Coronary artery disease involving native coronary artery of native heart without angina pectoris  I25.10     5. COPD (chronic obstructive pulmonary disease) with chronic bronchitis  J44.89     6. Diabetes mellitus type 2, diet-controlled ([place])  E11.9      From a general health maintenance standpoint, he has been doing well.  Does have multiple medical problems including coronary disease and COPD, and he continues to smoke.  He is still in the precontemplative stage of quitting tobacco. -He is very compliant with taking all of his medications.  Over and above health maintenance.  He does have some significant COPD, and he is feeling short of breath at baseline right now.  I do think he would do better with a baseline inhaled  corticosteroid and long-acting bronchodilator.  I am going to give him some generic Advair to use twice a day.  I would be surprised if he does not feel better from a breathing standpoint on a day-to-day basis.  Diabetes is stable with an A1c of 6.2.  He has done a great job changing his eating habits.  No need to start any medications.  Health Maintenance Exam: The patient's preventative maintenance and recommended screening tests for an annual wellness exam were reviewed in full today. Brought up to date unless services declined.  Counselled on the importance of diet, exercise, and its role in overall health and mortality. The patient's FH and SH was reviewed, including their home life, tobacco status, and drug and alcohol status.  Follow-up in 1 year for physical exam or additional follow-up below.  Disposition: No follow-ups on file.  Meds ordered this encounter  Medications   fluticasone-salmeterol (ADVAIR) 250-50 MCG/ACT  AEPB    Sig: Inhale 1 puff into the lungs in the morning and at bedtime.    Dispense:  60 each    Refill:  3   ezetimibe (ZETIA) 10 MG tablet    Sig: Take 1 tablet (10 mg total) by mouth daily.    Dispense:  90 tablet    Refill:  3    Requesting 1 year supply   fludrocortisone (FLORINEF) 0.1 MG tablet    Sig: Take 2 tablets (200 mcg total) by mouth daily.    Dispense:  180 tablet    Refill:  3    Requesting 1 year supply   Medications Discontinued During This Encounter  Medication Reason   predniSONE (DELTASONE) 20 MG tablet    fludrocortisone (FLORINEF) 0.1 MG tablet Reorder   ezetimibe (ZETIA) 10 MG tablet Reorder   No orders of the defined types were placed in this encounter.   Signed,  Maud Deed. Abhimanyu Cruces, MD   Allergies as of 09/21/2022       Reactions   Other Nausea And Vomiting, Other (See Comments)   Quail eggs caused severe stomach pain and n/v        Medication List        Accurate as of September 21, 2022 10:25 AM. If you have any questions, ask your nurse or doctor.          STOP taking these medications    predniSONE 20 MG tablet Commonly known as: DELTASONE Stopped by: Owens Loffler, MD       TAKE these medications    albuterol 108 (90 Base) MCG/ACT inhaler Commonly known as: VENTOLIN HFA Inhale 2 puffs into the lungs every 6 (six) hours as needed for wheezing or shortness of breath.   amLODipine 2.5 MG tablet Commonly known as: NORVASC Take 1 tablet (2.5 mg total) by mouth daily.   aspirin EC 81 MG tablet Take 81 mg by mouth daily.   atorvastatin 80 MG tablet Commonly known as: LIPITOR TAKE 1 TABLET BY MOUTH ONCE DAILY   blood glucose meter kit and supplies Kit Dispense based on patient and insurance preference. Use up to two times daily as directed.   diclofenac 75 MG EC tablet Commonly known as: VOLTAREN TAKE 1 TABLET(75 MG) BY MOUTH TWICE DAILY   escitalopram 10 MG tablet Commonly known as: LEXAPRO TAKE 1 TABLET BY  MOUTH  DAILY   ezetimibe 10 MG tablet Commonly known as: ZETIA Take 1 tablet (10 mg total) by mouth daily.   Fish Oil 1200 MG Caps Take 1,200 mg  by mouth daily.   fludrocortisone 0.1 MG tablet Commonly known as: FLORINEF Take 2 tablets (200 mcg total) by mouth daily.   fluticasone-salmeterol 250-50 MCG/ACT Aepb Commonly known as: ADVAIR Inhale 1 puff into the lungs in the morning and at bedtime. Started by: Owens Loffler, MD   ibuprofen 200 MG tablet Commonly known as: ADVIL Take 200-800 mg by mouth every 6 (six) hours as needed for moderate pain.   ipratropium-albuterol 0.5-2.5 (3) MG/3ML Soln Commonly known as: DUONEB USE 1 VIAL VIA NEBULIZER EVERY 6 HOURS AS NEEDED(SCHEDULED TWICE DAILY)   isosorbide mononitrate 30 MG 24 hr tablet Commonly known as: IMDUR TAKE 1 TABLET BY MOUTH DAILY   multivitamin with minerals Tabs tablet Take 1 tablet by mouth daily.   nitroGLYCERIN 0.4 MG SL tablet Commonly known as: NITROSTAT DISSOLVE 1 TABLET UNDER THE TONGUE EVERY 5 MINUTES AS NEEDED FOR CHEST PAIN   tiZANidine 4 MG tablet Commonly known as: ZANAFLEX Take 1 tablet (4 mg total) by mouth 2 (two) times daily as needed for muscle spasms. Primarily at night, since daytime use will make drowsy

## 2022-09-21 ENCOUNTER — Encounter: Payer: Self-pay | Admitting: Family Medicine

## 2022-09-21 ENCOUNTER — Ambulatory Visit (INDEPENDENT_AMBULATORY_CARE_PROVIDER_SITE_OTHER): Payer: Managed Care, Other (non HMO) | Admitting: Family Medicine

## 2022-09-21 VITALS — BP 110/68 | HR 76 | Temp 98.0°F | Ht 65.0 in | Wt 127.1 lb

## 2022-09-21 DIAGNOSIS — Z0000 Encounter for general adult medical examination without abnormal findings: Secondary | ICD-10-CM | POA: Diagnosis not present

## 2022-09-21 DIAGNOSIS — I2510 Atherosclerotic heart disease of native coronary artery without angina pectoris: Secondary | ICD-10-CM

## 2022-09-21 DIAGNOSIS — I951 Orthostatic hypotension: Secondary | ICD-10-CM

## 2022-09-21 DIAGNOSIS — J4489 Other specified chronic obstructive pulmonary disease: Secondary | ICD-10-CM

## 2022-09-21 DIAGNOSIS — E119 Type 2 diabetes mellitus without complications: Secondary | ICD-10-CM | POA: Diagnosis not present

## 2022-09-21 DIAGNOSIS — E785 Hyperlipidemia, unspecified: Secondary | ICD-10-CM

## 2022-09-21 MED ORDER — FLUDROCORTISONE ACETATE 0.1 MG PO TABS: 200.0000 ug | ORAL_TABLET | Freq: Every day | ORAL | 3 refills | Status: DC

## 2022-09-21 MED ORDER — FLUTICASONE-SALMETEROL 250-50 MCG/ACT IN AEPB: 1.0000 | INHALATION_SPRAY | Freq: Two times a day (BID) | RESPIRATORY_TRACT | 3 refills | Status: DC

## 2022-09-21 MED ORDER — EZETIMIBE 10 MG PO TABS: 10.0000 mg | ORAL_TABLET | Freq: Every day | ORAL | 3 refills | Status: DC

## 2022-09-22 ENCOUNTER — Ambulatory Visit: Payer: Managed Care, Other (non HMO) | Attending: Cardiovascular Disease | Admitting: Cardiovascular Disease

## 2022-09-22 ENCOUNTER — Encounter: Payer: Self-pay | Admitting: Cardiovascular Disease

## 2022-09-22 DIAGNOSIS — I2510 Atherosclerotic heart disease of native coronary artery without angina pectoris: Secondary | ICD-10-CM

## 2022-09-22 DIAGNOSIS — E785 Hyperlipidemia, unspecified: Secondary | ICD-10-CM | POA: Diagnosis not present

## 2022-09-22 MED ORDER — ATORVASTATIN CALCIUM 80 MG PO TABS: 80.0000 mg | ORAL_TABLET | Freq: Every day | ORAL | 2 refills | Status: DC

## 2022-09-22 MED ORDER — AMLODIPINE BESYLATE 2.5 MG PO TABS: 2.5000 mg | ORAL_TABLET | Freq: Every day | ORAL | 3 refills | Status: DC

## 2022-09-22 MED ORDER — ISOSORBIDE MONONITRATE ER 30 MG PO TB24: 30.0000 mg | ORAL_TABLET | Freq: Every day | ORAL | 3 refills | Status: DC

## 2022-09-22 NOTE — Progress Notes (Signed)
Patient ID: CLAUDY ABDALLAH, male   DOB: 01-Aug-1961, 62 y.o.   MRN: [id number]   Cardiology Office Note  Date:  09/22/2022   ID:  CLOYS VERA, DOB 1960-12-24, MRN [id number]  PCP:  Owens Loffler, MD   Chief Complaint  Patient presents with   Follow-up    "Doing well." Medications reviewed by the patient verbally.     HPI:  62 year old gentleman who is very active at baseline who works as a Company secretary, smoking Who continues to smoke   GERD,  remote neck injury with fusion surgery in 2002 with chronic neck pain and tightness,  chronic chest pain symptoms, relieved with nitroglycerin previous cardiac catheterization August 2014 showing minimal coronary artery disease,  History of orthostasis, relieved with Florinef CT coronary calcium score reviewed with him, score 135 non-STEMI in the setting of coronary vasospasm  03/2019, nonobstructive disease presenting for follow-up of his CAD  Seen by myself in clinic December 2021 Seen by one of our providers January 2023  Continues to work full-time in [employer],  going to retire end of March 2024, would like to travel Continues to smoke under 1 pack/day Chantix did not help with smoking cessation Uses a nebulizer as needed  Leg pain, disk issues Better with prednisone over the past 2 weeks  Denies significant chest pain concerning for angina Lost weight after losing his father, significant stress  EKG personally reviewed by myself on todays visit Normal sinus rhythm rate 91 bpm no significant ST-T wave changes  Other past medical history reviewed Seen in the hospital July 2020   non-STEMI in the setting of coronary vasospasm High-sensitivity troponin from 3 up to 40 up to 157 -Cardiac catheterization showing Mild to moderate CAD with a normal LAD, 40% smooth stenosis of the OM1 branch of the left circumflex coronary artery, and a dominant RCA with 30% mid narrowing, and in one view up to a 70% stenosis proximal to the  acute margin. down to 40% after intracoronary nitroglycerin  03/2019 Echo: EF 50-55%, nl RV size/fxn. Trace MR/TR.  Other past medical history reviewed In the ER 11/2017 for chest pain and diarrhea Hospital records reviewed with the patient in detail diagnosed with enteritis on CT and discharged on antibiotics.  continued profuse and frequent diarrhea.  feeling lightheaded, developed chest pains around 4 AM, took a nitroglycerin tablet but the chest pain remains so he took a second nitroglycerin tablet at that time he had a syncopal episode.  Previous GI workup was essentially negative, had EGD In the past, Ran out of isosorbide, symptoms became worse  Previously tried Levsin  PMH:   has a past medical history of BPH (benign prostatic hyperplasia), Cerebrovascular disease (but no CVA) (04/29/2015), COPD (chronic obstructive pulmonary disease) ([place]), Coronary artery disease/Coronary Vasospasm, Diabetes mellitus type 2, diet-controlled ([place]) (42/70/6237), Diastolic dysfunction, GAD (generalized anxiety disorder), GERD (gastroesophageal reflux disease), History of kidney stones, Hyperlipidemia, Myocardial infarction ([place]) (03/2019), Skin cancer, and Tobacco abuse.  PSH:    Past Surgical History:  Procedure Laterality Date   BACK SURGERY  1998   NO METAL   CARDIAC CATHETERIZATION  04/06/13   [facility]- minor luminal irregularities, otherwise normal cors; EF > 55%. Medical management and smoking cessation recommended   CATARACT EXTRACTION W/PHACO Right 08/28/2020   Procedure: CATARACT EXTRACTION PHACO AND INTRAOCULAR LENS PLACEMENT ([place]) RIGHT Tyler Deis;  Surgeon: Leandrew Koyanagi, MD;  Location: [facility];  Service: Ophthalmology;  Laterality: Right;  1.77 0:31.9 5.6%   CYSTOSCOPY/URETEROSCOPY/HOLMIUM  LASER/STENT PLACEMENT Left 06/13/2020   Procedure: CYSTOSCOPY/URETEROSCOPY//STENT PLACEMENT;  Surgeon: Hollice Espy, MD;  Location: [facility];  Service: Urology;  Laterality: Left;    CYSTOSCOPY/URETEROSCOPY/HOLMIUM LASER/STENT PLACEMENT Left 06/24/2020   Procedure: CYSTOSCOPY/URETEROSCOPY/HOLMIUM LASER/STENT PLACEMENT;  Surgeon: Hollice Espy, MD;  Location: [facility];  Service: Urology;  Laterality: Left;   KNEE ARTHROSCOPY W/ PARTIAL MEDIAL MENISCECTOMY Right 2012   [place], 2012   KNEE ARTHROSCOPY WITH MEDIAL MENISECTOMY Left 11/13/2020   Procedure: Left knee arthroscopy with partial medial or lateral menisectomy, possible chondroplasty, possible partial synovectomy;  Surgeon: Lovell Sheehan, MD;  Location: [facility];  Service: Orthopedics;  Laterality: Left;   LEFT HEART CATH AND CORONARY ANGIOGRAPHY N/A 03/20/2019   Procedure: LEFT HEART CATH AND CORONARY ANGIOGRAPHY;  Surgeon: Troy Sine, MD;  Location: [facility];  Service: Cardiovascular;  Laterality: N/A;   NASAL SINUS SURGERY     neck fusion  2000   TONSILLECTOMY     VASECTOMY      Current Outpatient Medications  Medication Sig Dispense Refill   albuterol (VENTOLIN HFA) 108 (90 Base) MCG/ACT inhaler Inhale 2 puffs into the lungs every 6 (six) hours as needed for wheezing or shortness of breath.     amLODipine (NORVASC) 2.5 MG tablet Take 1 tablet (2.5 mg total) by mouth daily. 30 tablet 0   aspirin EC 81 MG tablet Take 81 mg by mouth daily.     atorvastatin (LIPITOR) 80 MG tablet TAKE 1 TABLET BY MOUTH ONCE DAILY 90 tablet 2   blood glucose meter kit and supplies KIT Dispense based on patient and insurance preference. Use up to two times daily as directed. 1 each 0   diclofenac (VOLTAREN) 75 MG EC tablet TAKE 1 TABLET(75 MG) BY MOUTH TWICE DAILY 60 tablet 3   escitalopram (LEXAPRO) 10 MG tablet TAKE 1 TABLET BY MOUTH  DAILY 90 tablet 3   ezetimibe (ZETIA) 10 MG tablet Take 1 tablet (10 mg total) by mouth daily. 90 tablet 3   fludrocortisone (FLORINEF) 0.1 MG tablet Take 2 tablets (200 mcg total) by mouth daily. 180 tablet 3   fluticasone-salmeterol (ADVAIR) 250-50 MCG/ACT AEPB Inhale 1 puff into the  lungs in the morning and at bedtime. 60 each 3   ibuprofen (ADVIL) 200 MG tablet Take 200-800 mg by mouth every 6 (six) hours as needed for moderate pain.     ipratropium-albuterol (DUONEB) 0.5-2.5 (3) MG/3ML SOLN USE 1 VIAL VIA NEBULIZER EVERY 6 HOURS AS NEEDED(SCHEDULED TWICE DAILY) 360 mL 0   isosorbide mononitrate (IMDUR) 30 MG 24 hr tablet TAKE 1 TABLET BY MOUTH DAILY 90 tablet 3   Multiple Vitamin (MULTIVITAMIN WITH MINERALS) TABS tablet Take 1 tablet by mouth daily.     nitroGLYCERIN (NITROSTAT) 0.4 MG SL tablet DISSOLVE 1 TABLET UNDER THE TONGUE EVERY 5 MINUTES AS NEEDED FOR CHEST PAIN 25 tablet 3   Omega-3 Fatty Acids (FISH OIL) 1200 MG CAPS Take 1,200 mg by mouth daily.     tiZANidine (ZANAFLEX) 4 MG tablet Take 1 tablet (4 mg total) by mouth 2 (two) times daily as needed for muscle spasms. Primarily at night, since daytime use will make drowsy 60 tablet 2   No current facility-administered medications for this visit.     Allergies:   Other   Social History:  The patient  reports that he has been smoking cigarettes. He has a 40.00 pack-year smoking history. He has never used smokeless tobacco. He reports that he does not drink alcohol and  does not use drugs.   Family History:   family history includes Arthritis in his father; CVA (age of onset: 24) in his father; Colon cancer in his mother; Coronary artery disease in his brother and father; Heart attack in his brother and brother; Hypertension in his father.    Review of Systems: Review of Systems  Constitutional: Negative.   HENT: Negative.    Respiratory: Negative.    Cardiovascular: Negative.   Gastrointestinal: Negative.   Musculoskeletal: Negative.   Neurological: Negative.   Psychiatric/Behavioral: Negative.    All other systems reviewed and are negative.   PHYSICAL EXAM: VS:  BP 108/64 (BP Location: Left Arm, Patient Position: Sitting, Cuff Size: Normal)   Pulse 91   Ht '5\' 5"'$  (1.651 m)   Wt 130 lb (59 kg)   SpO2  96%   BMI 21.63 kg/m  , BMI Body mass index is 21.63 kg/m. Constitutional:  oriented to person, place, and time. No distress.  HENT:  Head: Grossly normal Eyes:  no discharge. No scleral icterus.  Neck: No JVD, no carotid bruits  Cardiovascular: Regular rate and rhythm, no murmurs appreciated Pulmonary/Chest: Clear to auscultation bilaterally, no wheezes or rails Abdominal: Soft.  no distension.  no tenderness.  Musculoskeletal: Normal range of motion Neurological:  normal muscle tone. Coordination normal. No atrophy Skin: Skin warm and dry Psychiatric: normal affect, pleasant  Recent Labs: 09/15/2022: ALT 24; BUN 18; Creatinine, Ser 1.04; Hemoglobin 13.9; Platelets 220.0; Potassium 4.2; Sodium 141    Lipid Panel Lab Results  Component Value Date   CHOL 120 09/15/2022   HDL 40.70 09/15/2022   LDLCALC 63 09/15/2022   TRIG 82.0 09/15/2022      Wt Readings from Last 3 Encounters:  09/22/22 130 lb (59 kg)  09/21/22 127 lb 2 oz (57.7 kg)  08/27/22 127 lb (57.6 kg)    ASSESSMENT AND PLAN:  NSTEMI, CAD with chronic stable angina July 2020 catheterization in the hospital, confirmed diagnosed with coronary spasm, nonobstructive Smoking cessation recommended No significant chest pain on today's visit, no further workup at this time  Tobacco abuse We have encouraged him to continue to work on weaning his cigarettes and smoking cessation. He will continue to work on this and does not want any assistance with chantix.    Orthostasis Weight has continued to trend downward, denies orthostasis symptoms Off Florinef  On Imdur for spasm  Hyperlipidemia Cholesterol is at goal on the current lipid regimen. No changes to the medications were made.    Total encounter time more than 30 minutes  Greater than 50% was spent in counseling and coordination of care with the patient    No orders of the defined types were placed in this encounter.     Signed, Esmond Plants, M.D.,  Ph.D. 09/22/2022  [place], [place]

## 2022-09-22 NOTE — Patient Instructions (Signed)
Medication Instructions:  No changes  If you need a refill on your cardiac medications before your next appointment, please call your pharmacy.   Lab work: No new labs needed  Testing/Procedures: No new testing needed  Follow-Up: At [facility], you and your health needs are our priority.  As part of our continuing mission to provide you with exceptional heart care, we have created designated Provider Care Teams.  These Care Teams include your primary Cardiologist (physician) and Advanced Practice Providers (APPs -  Physician Assistants and Nurse Practitioners) who all work together to provide you with the care you need, when you need it.  You will need a follow up appointment in 12 months  Providers on your designated Care Team:   Christopher Berge, NP Ryan Dunn, PA-C Cadence Furth, PA-C  COVID-19 Vaccine Information can be found at: [email or web address] For questions related to vaccine distribution or appointments, please email [email or web address] or call [phone].   

## 2022-10-28 ENCOUNTER — Other Ambulatory Visit: Payer: Self-pay | Admitting: *Deleted

## 2022-10-28 MED ORDER — IPRATROPIUM-ALBUTEROL 0.5-2.5 (3) MG/3ML IN SOLN: RESPIRATORY_TRACT | 3 refills | Status: DC

## 2022-10-29 MED ORDER — IPRATROPIUM-ALBUTEROL 0.5-2.5 (3) MG/3ML IN SOLN: 3.0000 mL | Freq: Four times a day (QID) | RESPIRATORY_TRACT | 3 refills | Status: DC | PRN

## 2022-10-29 NOTE — Addendum Note (Signed)
Addended by: Carter Kitten on: 10/29/2022 02:53 PM   Modules accepted: Orders

## 2022-11-04 ENCOUNTER — Ambulatory Visit: Payer: Managed Care, Other (non HMO) | Admitting: Cardiovascular Disease

## 2022-11-09 ENCOUNTER — Emergency Department: Payer: Managed Care, Other (non HMO)

## 2022-11-09 ENCOUNTER — Other Ambulatory Visit: Payer: Self-pay

## 2022-11-09 ENCOUNTER — Emergency Department
Admission: EM | Admit: 2022-11-09 | Discharge: 2022-11-09 | Disposition: A | Discharge status: Home / Self Care | Payer: Managed Care, Other (non HMO) | Attending: Emergency Medicine | Admitting: Emergency Medicine

## 2022-11-09 DIAGNOSIS — D72829 Elevated white blood cell count, unspecified: Secondary | ICD-10-CM | POA: Diagnosis not present

## 2022-11-09 DIAGNOSIS — M5412 Radiculopathy, cervical region: Secondary | ICD-10-CM | POA: Insufficient documentation

## 2022-11-09 DIAGNOSIS — M25511 Pain in right shoulder: Secondary | ICD-10-CM | POA: Diagnosis present

## 2022-11-09 DIAGNOSIS — E119 Type 2 diabetes mellitus without complications: Secondary | ICD-10-CM | POA: Insufficient documentation

## 2022-11-09 DIAGNOSIS — F17200 Nicotine dependence, unspecified, uncomplicated: Secondary | ICD-10-CM | POA: Diagnosis not present

## 2022-11-09 DIAGNOSIS — I2510 Atherosclerotic heart disease of native coronary artery without angina pectoris: Secondary | ICD-10-CM | POA: Diagnosis not present

## 2022-11-09 DIAGNOSIS — J181 Lobar pneumonia, unspecified organism: Secondary | ICD-10-CM | POA: Insufficient documentation

## 2022-11-09 DIAGNOSIS — J189 Pneumonia, unspecified organism: Secondary | ICD-10-CM

## 2022-11-09 DIAGNOSIS — Z1152 Encounter for screening for COVID-19: Secondary | ICD-10-CM | POA: Diagnosis not present

## 2022-11-09 LAB — COMPREHENSIVE METABOLIC PANEL
ALT: 29 U/L (ref 0–44)
AST: 28 U/L (ref 15–41)
Albumin: 4 g/dL (ref 3.5–5.0)
Alkaline Phosphatase: 81 U/L (ref 38–126)
Anion gap: 3 — ABNORMAL LOW (ref 5–15)
BUN: 17 mg/dL (ref 8–23)
CO2: 28 mmol/L (ref 22–32)
Calcium: 8.9 mg/dL (ref 8.9–10.3)
Chloride: 106 mmol/L (ref 98–111)
Creatinine, Ser: 0.81 mg/dL (ref 0.61–1.24)
GFR, Estimated: >60 mL/min (ref >60)
Glucose, Bld: 99 mg/dL (ref 70–99)
Potassium: 4.3 mmol/L (ref 3.5–5.1)
Sodium: 137 mmol/L (ref 135–145)
Total Bilirubin: 0.8 mg/dL (ref 0.3–1.2)
Total Protein: 7 g/dL (ref 6.5–8.1)

## 2022-11-09 LAB — CBC
HCT: 39.4 % (ref 39.0–52.0)
Hemoglobin: 13 g/dL (ref 13.0–17.0)
MCH: 31.6 pg (ref 26.0–34.0)
MCHC: 33 g/dL (ref 30.0–36.0)
MCV: 95.6 fL (ref 80.0–100.0)
Platelets: 248 10*3/uL (ref 150–400)
RBC: 4.12 MIL/uL — ABNORMAL LOW (ref 4.22–5.81)
RDW: 16.7 % — ABNORMAL HIGH (ref 11.5–15.5)
WBC: 15.2 10*3/uL — ABNORMAL HIGH (ref 4.0–10.5)
nRBC: 0 % (ref 0.0–0.2)

## 2022-11-09 LAB — RESP PANEL BY RT-PCR (RSV, FLU A&B, COVID)  RVPGX2
Influenza A by PCR: NEGATIVE
Influenza B by PCR: NEGATIVE
Resp Syncytial Virus by PCR: NEGATIVE
SARS Coronavirus 2 by RT PCR: NEGATIVE

## 2022-11-09 LAB — LIPASE, BLOOD: Lipase: 42 U/L (ref 11–51)

## 2022-11-09 LAB — TROPONIN I (HIGH SENSITIVITY)
Troponin I (High Sensitivity): 3 ng/L (ref <18)
Troponin I (High Sensitivity): 3 ng/L (ref <18)

## 2022-11-09 LAB — D-DIMER, QUANTITATIVE: D-Dimer, Quant: 0.47 ug/mL-FEU (ref 0.00–0.50)

## 2022-11-09 MED ORDER — LIDOCAINE 5 % EX PTCH
1.0000 | MEDICATED_PATCH | Freq: Once | CUTANEOUS | Status: DC
  Administered 2022-11-09: 1 via TRANSDERMAL
  Filled 2022-11-09: qty 1

## 2022-11-09 MED ORDER — KETOROLAC TROMETHAMINE 15 MG/ML IJ SOLN
15.0000 mg | Freq: Once | INTRAMUSCULAR | Status: AC
  Administered 2022-11-09: 15 mg via INTRAVENOUS
  Filled 2022-11-09: qty 1

## 2022-11-09 MED ORDER — OXYCODONE HCL 5 MG PO TABS
5.0000 mg | ORAL_TABLET | Freq: Once | ORAL | Status: AC
  Administered 2022-11-09: 5 mg via ORAL
  Filled 2022-11-09: qty 1

## 2022-11-09 MED ORDER — AMOXICILLIN-POT CLAVULANATE 875-125 MG PO TABS: 1.0000 | ORAL_TABLET | Freq: Two times a day (BID) | ORAL | 0 refills | Status: AC

## 2022-11-09 MED ORDER — ONDANSETRON HCL 4 MG/2ML IJ SOLN
4.0000 mg | Freq: Once | INTRAMUSCULAR | Status: AC
  Administered 2022-11-09: 4 mg via INTRAVENOUS
  Filled 2022-11-09: qty 2

## 2022-11-09 MED ORDER — OXYCODONE HCL 5 MG PO TABS: 5.0000 mg | ORAL_TABLET | Freq: Four times a day (QID) | ORAL | 0 refills | Status: AC | PRN

## 2022-11-09 MED ORDER — AZITHROMYCIN 250 MG PO TABS: ORAL_TABLET | ORAL | 0 refills | Status: AC

## 2022-11-09 MED ORDER — ACETAMINOPHEN 500 MG PO TABS
1000.0000 mg | ORAL_TABLET | Freq: Once | ORAL | Status: AC
  Administered 2022-11-09: 1000 mg via ORAL
  Filled 2022-11-09: qty 2

## 2022-11-09 MED ORDER — PREDNISONE 10 MG (21) PO TBPK: ORAL_TABLET | ORAL | 0 refills | Status: DC

## 2022-11-09 MED ORDER — HYDROMORPHONE HCL 1 MG/ML IJ SOLN
0.5000 mg | Freq: Once | INTRAMUSCULAR | Status: AC
  Administered 2022-11-09: 0.5 mg via INTRAVENOUS
  Filled 2022-11-09: qty 0.5

## 2022-11-09 MED ORDER — LIDOCAINE 5 % EX PTCH: 1.0000 | MEDICATED_PATCH | Freq: Two times a day (BID) | CUTANEOUS | 0 refills | Status: AC

## 2022-11-09 NOTE — ED Provider Notes (Signed)
[facility] Provider Note    Event Date/Time   First MD Initiated Contact with Patient 11/09/22 0102     (approximate)   History   Shoulder Pain and Emesis   HPI  Isaiah Rangel is a 62 y.o. male who comes in from EMS for right shoulder pain since Thursday.  On review of records patient is followed by Dr. Rockey Situ seen on 1/23 for coronary artery disease.  He also has a history of diabetes, cervical radiculopathy patient is followed by neurosurgery Dr Izora Ribas for this.  It does appear that in the past has had some issues with right shoulder blade pain.  He denies any neck pain currently but he states that he is having this pain in his right shoulder blade that is a severe shooting pain down the arm.  Worse with movement- better at rest.  He reports that he is had this pain previously has been on steroids which has helped.  He reports taking some pain medicine his primary care doctor had given him which did not seem to help.  Denies any Tylenol use or anything else recently..  It appears the last time he saw neurosurgery that they had stated that he could get a another epidural steroid injection at C4-C5 if this pain came back. Reports the pain is so bad he gets nausea and he gets some catching of his breath. No chest pain.    Physical Exam   Triage Vital Signs: ED Triage Vitals  Enc Vitals Group     BP 11/09/22 0042 (!) 129/52     Pulse Rate 11/09/22 0042 71     Resp 11/09/22 0042 18     Temp --      Temp Source 11/09/22 0042 Oral     SpO2 11/09/22 0049 90 %     Weight 11/09/22 0038 127 lb (57.6 kg)     Height 11/09/22 0038 '5\' 5"'$  (1.651 m)     Head Circumference --      Peak Flow --      Pain Score 11/09/22 0037 5     Pain Loc --      Pain Edu? --      Excl. in [place]? --     Most recent vital signs: Vitals:   11/09/22 0042 11/09/22 0049  BP: (!) 129/52   Pulse: 71   Resp: 18   SpO2:  90%     General: Awake, appears uncomfortable. CV:  Good  peripheral perfusion.  Resp:  Normal effort.  Abd:  No distention. Soft and non tender Other:  Good distal pulse in the arm.  He is able to range the shoulder up to a certain point which she states is baseline. NO swelling or redness of any of the joints.  No rash noted along the back. Some pain on the R scapula. Radiates down the Arm per patient.  No swelling in legs.  No calf tenderness.   ED Results / Procedures / Treatments   Labs (all labs ordered are listed, but only abnormal results are displayed) Labs Reviewed  CBC - Abnormal; Notable for the following components:      Result Value   WBC 15.2 (*)    RBC 4.12 (*)    RDW 16.7 (*)    All other components within normal limits  COMPREHENSIVE METABOLIC PANEL - Abnormal; Notable for the following components:   Anion gap 3 (*)    All other components within normal limits  RESP PANEL BY RT-PCR (RSV, FLU A&B, COVID)  RVPGX2  LIPASE, BLOOD  D-DIMER, QUANTITATIVE  URINALYSIS, ROUTINE W REFLEX MICROSCOPIC  TROPONIN I (HIGH SENSITIVITY)  TROPONIN I (HIGH SENSITIVITY)     EKG  My interpretation of EKG:  Normal sinus rate of 66 without any ST elevation or T wave inversions, normal intervals  RADIOLOGY I have reviewed the xray personally and interpreted and possible right lower lung pneumonia  PROCEDURES:  Critical Care performed: No  .1-3 Lead EKG Interpretation  Performed by: Vanessa [place], MD Authorized by: Vanessa [place], MD     Interpretation: normal     ECG rate:  80   ECG rate assessment: normal     Rhythm: sinus rhythm     Ectopy: none     Conduction: normal      MEDICATIONS ORDERED IN ED: Medications  lidocaine (LIDODERM) 5 % 1 patch (1 patch Transdermal Patch Applied 11/09/22 0251)  HYDROmorphone (DILAUDID) injection 0.5 mg (0.5 mg Intravenous Given 11/09/22 0143)  ondansetron (ZOFRAN) injection 4 mg (4 mg Intravenous Given 11/09/22 0143)  ketorolac (TORADOL) 15 MG/ML injection 15 mg (15 mg Intravenous Given  11/09/22 0333)  acetaminophen (TYLENOL) tablet 1,000 mg (1,000 mg Oral Given 11/09/22 0333)     IMPRESSION / MDM / ASSESSMENT AND PLAN / ED COURSE  I reviewed the triage vital signs and the nursing notes.   Patient's presentation is most consistent with acute presentation with potential threat to life or bodily function.   Patient comes in with severe right shoulder pain.  Workup was done to evaluate for ACS, pneumonia, pneumothorax, no evident obvious evidence of septic joint.  Denies any risk factors for PE so we will get D-dimer.  Good distal pulse.  No obvious swelling.  I reviewed patient's records where he has had a history of cervical radiculopathy with similar symptoms so could be related to this if workup is otherwise reassuring.  Will give a dose of IV Dilaudid while pending results.  D-dimer negative.  COVID flu negative.  White count elevated at 15.  CMP reassuring.  Lipase normal troponin is negative.  Discussed with patient feels much better after the Dilaudid.  He reports no pain only pain if he moves his neck or his shoulder certain ways which seems more like his cervical radiculopathy that he had at the past. Does habe mechanism of injury with recent shoveling before this started. He does report that this is similar pain that he has had previously before having an injection done in June.  He denies any new numbness of the arm or weakness of the arm to suggest a cervical myelopathy.  He denies any IV drug use or fevers to suggest cervical abscess.  We discussed MRI but he has had 1 within the last year that has shown known degenerative disease and stenosis.  Given his exam is similar to previous and symptoms are previous [place] been like this he would like to hold off on MRI.  He does report significant relief with steroids at home.  We discussed the possible pneumonia on x-ray.  When I was in the room his oxygen levels are 96 to 98%.  He does smoke but no wheezing on examination.  We  discussed that this could be contributing to some of the pain but he denies any obvious new cough or shortness of breath he reports most the pain is just from movement of the arm or the neck.  However he is okay with proceeding  with some antibiotics to just ensure that this is not getting worse.    Considered admission but repeat troponin is negative.  I also discussed with patient that if he was concerned for cough or hypoxia we admit him for the pneumonia but he is really denying any of those symptoms.  He on repeat evaluation feels comfortable with discharge some mild increasing of pain we will give a dose of oxycodone to help bridge given he will be able to pick up his medications till the morning.  He expressed understanding felt comfortable with this plan.  Repeat exam continues to have good distal pulse no numbness good grip strength still felt comfortable with discharge home   The patient is on the cardiac monitor to evaluate for evidence of arrhythmia and/or significant heart rate changes.      FINAL CLINICAL IMPRESSION(S) / ED DIAGNOSES   Final diagnoses:  Cervical radiculopathy  Pneumonia of right lower lobe due to infectious organism     Rx / DC Orders   ED Discharge Orders          Ordered    oxyCODONE (ROXICODONE) 5 MG immediate release tablet  Every 6 hours PRN        11/09/22 0405    predniSONE (STERAPRED UNI-PAK 21 TAB) 10 MG (21) TBPK tablet        11/09/22 0405    lidocaine (LIDODERM) 5 %  Every 12 hours        11/09/22 0405    amoxicillin-clavulanate (AUGMENTIN) 875-125 MG tablet  2 times daily        11/09/22 0405    azithromycin (ZITHROMAX Z-PAK) 250 MG tablet        11/09/22 0405             Note:  This document was prepared using Dragon voice recognition software and may include unintentional dictation errors.   Vanessa [place], MD 11/09/22 [phone]

## 2022-11-09 NOTE — Discharge Instructions (Addendum)
Take antibiotics to help with possible pneumonia that could be forming.  Take Tylenol 1 g every 8 hours with ibuprofen 600 every 8 hours with food to help with pain.  Use the lidocaine patches and the steroids as well.  Use the oxycodone for breakthrough pain do not drive or work while on these.  Please call your neurosurgeon to make follow-up to discuss steroid injections  IMPRESSION: Increased bronchial wall thickening and interstitial coarsening in the right lower lung, which may reflect bronchitis or developing bronchopneumonia.

## 2022-11-09 NOTE — ED Triage Notes (Signed)
Pt arrived via [facility] from home reports R shoulder pain since Thursday, the pain worse tonight, pt states he has been shoveling mulch for several days. Pt c/o multiple episodes of vomiting and nausea.  PT c/o being weak as well.

## 2022-11-10 ENCOUNTER — Telehealth: Payer: Self-pay

## 2022-11-10 NOTE — Transitions of Care (Post Inpatient/ED Visit) (Signed)
   11/10/2022  Name: Isaiah Rangel MRN: [id number] DOB: 31-Jan-1961  Today's TOC FU Call Status: Today's TOC FU Call Status:: Successful TOC FU Call Competed TOC FU Call Complete Date: 11/10/22  Transition Care Management Follow-up Telephone Call Date of Discharge: 11/09/22 Discharge Facility: [facility]) Type of Discharge: Emergency Department Reason for ED Visit: Other: (Cervical radiculopathy) How have you been since you were released from the hospital?: Better Any questions or concerns?: No  Items Reviewed: Did you receive and understand the discharge instructions provided?: Yes Medications obtained and verified?: Yes (Medications Reviewed) Any new allergies since your discharge?: No Dietary orders reviewed?: Yes Do you have support at home?: Yes  Home Care and Equipment/Supplies: [place] Ordered?: No Any new equipment or medical supplies ordered?: No  Functional Questionnaire: Do you need assistance with bathing/showering or dressing?: No Do you need assistance with meal preparation?: No Do you need assistance with eating?: No Do you have difficulty maintaining continence: No Do you need assistance with getting out of bed/getting out of a chair/moving?: No Do you have difficulty managing or taking your medications?: No  Folllow up appointments reviewed: PCP Follow-up appointment confirmed?: No (refused) MD Provider Line Number:[phone] Given: Yes [facility] Follow-up appointment confirmed?: No Do you need transportation to your follow-up appointment?: No Do you understand care options if your condition(s) worsen?: Yes-patient verbalized understanding    Graham LPN [place] Direct Dial [phone]

## 2022-12-08 ENCOUNTER — Encounter: Payer: Self-pay | Admitting: Nurse Practitioner

## 2022-12-08 ENCOUNTER — Ambulatory Visit (INDEPENDENT_AMBULATORY_CARE_PROVIDER_SITE_OTHER)
Admission: RE | Admit: 2022-12-08 | Discharge: 2022-12-08 | Disposition: A | Discharge status: Home / Self Care | Payer: BC Managed Care – PPO | Source: Ambulatory Visit | Attending: Nurse Practitioner | Admitting: Nurse Practitioner

## 2022-12-08 ENCOUNTER — Ambulatory Visit: Payer: BC Managed Care – PPO | Admitting: Nurse Practitioner

## 2022-12-08 VITALS — BP 114/64 | HR 86 | Temp 98.0°F | Resp 16 | Ht 65.0 in | Wt 121.5 lb

## 2022-12-08 DIAGNOSIS — J189 Pneumonia, unspecified organism: Secondary | ICD-10-CM | POA: Diagnosis not present

## 2022-12-08 DIAGNOSIS — Z720 Tobacco use: Secondary | ICD-10-CM | POA: Diagnosis not present

## 2022-12-08 DIAGNOSIS — J441 Chronic obstructive pulmonary disease with (acute) exacerbation: Secondary | ICD-10-CM

## 2022-12-08 DIAGNOSIS — J439 Emphysema, unspecified: Secondary | ICD-10-CM | POA: Diagnosis not present

## 2022-12-08 DIAGNOSIS — Z8701 Personal history of pneumonia (recurrent): Secondary | ICD-10-CM | POA: Insufficient documentation

## 2022-12-08 DIAGNOSIS — R051 Acute cough: Secondary | ICD-10-CM

## 2022-12-08 DIAGNOSIS — R059 Cough, unspecified: Secondary | ICD-10-CM | POA: Diagnosis not present

## 2022-12-08 MED ORDER — AZITHROMYCIN 250 MG PO TABS: ORAL_TABLET | ORAL | 0 refills | Status: AC

## 2022-12-08 MED ORDER — PREDNISONE 20 MG PO TABS: 40.0000 mg | ORAL_TABLET | Freq: Every day | ORAL | 0 refills | Status: AC

## 2022-12-08 NOTE — Progress Notes (Signed)
Acute Office Visit  Subjective:     Patient ID: Isaiah Rangel, male    DOB: June 17, 1961, 62 y.o.   MRN: [id number]  Chief Complaint  Patient presents with   Pneumonia    Possible pneumonia lungs full of phlem. Could not breathe.     Patient is in today for cough  Patient was seen in the ED on 11/09/2022 and dx with pna and was discharged on augmentin. He is here for a return of symptoms   States that he took the augmentin as prescirbed. States that since then states that his cough is getting worse. States that his lungs are full of mucous. States that his wife listened to his lungs  States that he has been using nebulizer that has helped some. He is doing it twice a day.  Review of Systems  Constitutional:  Positive for fever (subjective) and malaise/fatigue. Negative for chills.  HENT:  Negative for ear discharge, ear pain, sinus pain and sore throat.   Respiratory:  Positive for cough, sputum production and shortness of breath.   Neurological:  Negative for headaches.        Objective:    BP 114/64   Pulse 86   Temp 98 F (36.7 C)   Resp 16   Ht 5\' 5"  (1.651 m)   Wt 121 lb 8 oz (55.1 kg)   SpO2 99%   BMI 20.22 kg/m  BP Readings from Last 3 Encounters:  12/08/22 114/64  11/09/22 127/83  09/22/22 108/64   Wt Readings from Last 3 Encounters:  12/08/22 121 lb 8 oz (55.1 kg)  11/09/22 127 lb (57.6 kg)  09/22/22 130 lb (59 kg)      Physical Exam Vitals and nursing note reviewed.  Constitutional:      Appearance: Normal appearance.  Cardiovascular:     Rate and Rhythm: Normal rate and regular rhythm.     Heart sounds: Normal heart sounds.  Pulmonary:     Effort: Pulmonary effort is normal.     Breath sounds: Normal breath sounds.  Neurological:     Mental Status: He is alert.     No results found for any visits on 12/08/22.      Assessment & Plan:   Problem List Items Addressed This Visit       Respiratory   COPD exacerbation    Will treat  patient with prednisone 40 mg daily along with azithromycin 200 mg pack as directed.  Obtain chest x-ray to make sure no interval changes.  Pending result      Relevant Medications   azithromycin (ZITHROMAX) 250 MG tablet   predniSONE (DELTASONE) 20 MG tablet   Other Relevant Orders   DG Chest 2 View     Other   History of pneumonia - Primary    Incidental finding of pneumonia in the emergency department.  He was treated with Augmentin and finished the medication right resolved.  Will obtain a chest x-ray treat with azithromycin and prednisone today.      Relevant Orders   DG Chest 2 View   Acute cough    Pending chest x-ray.  Antibiotic and steroid as directed      Relevant Orders   DG Chest 2 View   Other Visit Diagnoses     Tobacco abuse           Meds ordered this encounter  Medications   azithromycin (ZITHROMAX) 250 MG tablet    Sig: Take 2 tablets on day  1, then 1 tablet daily on days 2 through 5    Dispense:  6 tablet    Refill:  0    Order Specific Question:   Supervising Provider    Answer:   Roxy Manns A [[id number]]   predniSONE (DELTASONE) 20 MG tablet    Sig: Take 2 tablets (40 mg total) by mouth daily with breakfast for 5 days.    Dispense:  10 tablet    Refill:  0    Order Specific Question:   Supervising Provider    Answer:   TOWER, MARNE A [[id number]]    Return if symptoms worsen or fail to improve.  Audria Nine, NP

## 2022-12-08 NOTE — Assessment & Plan Note (Signed)
Pending chest x-ray.  Antibiotic and steroid as directed

## 2022-12-08 NOTE — Assessment & Plan Note (Signed)
Will treat patient with prednisone 40 mg daily along with azithromycin 200 mg pack as directed.  Obtain chest x-ray to make sure no interval changes.  Pending result

## 2022-12-08 NOTE — Patient Instructions (Signed)
Nice to see you today Get some plain mucinex over the counter Drink plenty of water with it I have sent in steroids and an antibiotic to the pharmacy Follow up if you do not improve

## 2022-12-08 NOTE — Assessment & Plan Note (Signed)
Incidental finding of pneumonia in the emergency department.  He was treated with Augmentin and finished the medication right resolved.  Will obtain a chest x-ray treat with azithromycin and prednisone today.

## 2022-12-08 NOTE — Progress Notes (Unsigned)
Referring Physician:  Hannah Beatopland, Spencer, MD [address] [place],  [place] [id number]  Primary Physician:  Hannah Beatopland, Spencer, MD  History of Present Illness: 12/08/2022*** Mr. Michiel CowboyRonald Kanouse has a history of ***  Was last seen by Dr. Myer HaffYarbrough on 02/17/22 and he was doing well after PT/injection.   He has known history of cervical fusion with cervical spondylosis with moderate cervical stenosis at C3-4 and C4-5 with severe neuroforaminal stenosis at C4-5, C3-4, and C6-7. Repeat ESI was recommended if his symptoms returned.   He was seen in ED on 11/09/22 with right shoulder blade pain that radiates into his right arm that started after shoveling.       Was given lidocaine patches, oxycodone, and prednisone from ED. Was also given augmentin and a zpack for pneumonia.   Duration: *** Location: *** Quality: *** Severity: ***  Precipitating: aggravated by *** Modifying factors: made better by *** Weakness: none Timing: *** Bowel/Bladder Dysfunction: none  Conservative measures:  Physical therapy: currently participating since 12/17/21 at [facility]*** Multimodal medical therapy including regular antiinflammatories: tizanidine, norco, prednisone, ibuprofen, diclofenac, methocarbamol, medrol dosepack, percocet  Injections:  Right C4-C5 TF ESK on 02/06/22 Chasnis  Past Surgery[place]; lumbar surgery in 1996  Michiel CowboyRonald Niblett has some symptoms of cervical myelopathy.***  The symptoms are causing a significant impact on the patient's life.   Review of Systems:  A 10 point review of systems is negative, except for the pertinent positives and negatives detailed in the HPI.  Past Medical History: Past Medical History:  Diagnosis Date   BPH (benign prostatic hyperplasia)    Cerebrovascular disease (but no CVA) 04/29/2015   COPD (chronic obstructive pulmonary disease)    Coronary artery disease/Coronary Vasospasm    a. 03/2013 Cath: min  irregs. EF >55%; b. 03/2019 Cath: LM nl, LAD nl, LCX nl, OM1 40, RCA 30p, 5775m-->40% after IC NTG; c. 12/2019 MV: EF 65%, no ischemia/infarct-->low risk.   Diabetes mellitus type 2, diet-controlled 02/05/2021   Diastolic dysfunction    a. 03/2019 Echo: EF 50-55%, nl RV size/fxn. Trace MR/TR; b. 09/2019 Echo: EF 60-65%, mild LVH, GrI DD, no rwma, triv MR, mild AoV sclerosis w/o stenosis.   GAD (generalized anxiety disorder)    GERD (gastroesophageal reflux disease)    History of kidney stones    Hyperlipidemia    Myocardial infarction 03/2019   NO STENTS   Skin cancer    basal and squamous   Tobacco abuse     Past Surgical History: Past Surgical History:  Procedure Laterality Date   BACK SURGERY  1998   NO METAL   CARDIAC CATHETERIZATION  04/06/13   [facility]- minor luminal irregularities, otherwise normal cors; EF > 55%. Medical management and smoking cessation recommended   CATARACT EXTRACTION W/PHACO Right 08/28/2020   Procedure: CATARACT EXTRACTION PHACO AND INTRAOCULAR LENS PLACEMENT (IOC) RIGHT Kizzie BaneEYHANCE TORIC;  Surgeon: Lockie MolaBrasington, Chadwick, MD;  [facility];  Service: Ophthalmology;  Laterality: Right;  1.77 0:31.9 5.6%   CYSTOSCOPY/URETEROSCOPY/HOLMIUM LASER/STENT PLACEMENT Left 06/13/2020   Procedure: CYSTOSCOPY/URETEROSCOPY//STENT PLACEMENT;  Surgeon: Vanna ScotlandBrandon, Ashley, MD;  Location: [facility];  Service: Urology;  Laterality: Left;   CYSTOSCOPY/URETEROSCOPY/HOLMIUM LASER/STENT PLACEMENT Left 06/24/2020   Procedure: CYSTOSCOPY/URETEROSCOPY/HOLMIUM LASER/STENT PLACEMENT;  Surgeon: Vanna ScotlandBrandon, Ashley, MD;  Location: [facility];  Service: Urology;  Laterality: Left;   KNEE ARTHROSCOPY W/ PARTIAL MEDIAL MENISCECTOMY Right 2012   [place], 2012   KNEE ARTHROSCOPY WITH MEDIAL MENISECTOMY Left 11/13/2020   Procedure:  Left knee arthroscopy with partial medial or lateral menisectomy, possible chondroplasty, possible partial synovectomy;  Surgeon: Lyndle Herrlich, MD;  Location: [facility];   Service: Orthopedics;  Laterality: Left;   LEFT HEART CATH AND CORONARY ANGIOGRAPHY N/A 03/20/2019   Procedure: LEFT HEART CATH AND CORONARY ANGIOGRAPHY;  Surgeon: Lennette Bihari, MD;  Location: [facility];  Service: Cardiovascular;  Laterality: N/A;   NASAL SINUS SURGERY     neck fusion  2000   TONSILLECTOMY     VASECTOMY      Allergies: Allergies as of 12/10/2022 - Review Complete 12/08/2022  Allergen Reaction Noted   Other Nausea And Vomiting and Other (See Comments) 03/19/2019    Medications: Outpatient Encounter Medications as of 12/10/2022  Medication Sig   albuterol (VENTOLIN HFA) 108 (90 Base) MCG/ACT inhaler Inhale 2 puffs into the lungs every 6 (six) hours as needed for wheezing or shortness of breath.   amLODipine (NORVASC) 2.5 MG tablet Take 1 tablet (2.5 mg total) by mouth daily.   aspirin EC 81 MG tablet Take 81 mg by mouth daily.   atorvastatin (LIPITOR) 80 MG tablet Take 1 tablet (80 mg total) by mouth daily.   blood glucose meter kit and supplies KIT Dispense based on patient and insurance preference. Use up to two times daily as directed.   diclofenac (VOLTAREN) 75 MG EC tablet TAKE 1 TABLET(75 MG) BY MOUTH TWICE DAILY   escitalopram (LEXAPRO) 10 MG tablet TAKE 1 TABLET BY MOUTH  DAILY   ezetimibe (ZETIA) 10 MG tablet Take 1 tablet (10 mg total) by mouth daily.   fludrocortisone (FLORINEF) 0.1 MG tablet Take 2 tablets (200 mcg total) by mouth daily.   fluticasone-salmeterol (ADVAIR) 250-50 MCG/ACT AEPB Inhale 1 puff into the lungs in the morning and at bedtime.   ibuprofen (ADVIL) 200 MG tablet Take 200-800 mg by mouth every 6 (six) hours as needed for moderate pain.   ipratropium-albuterol (DUONEB) 0.5-2.5 (3) MG/3ML SOLN Take 3 mLs by nebulization every 6 (six) hours as needed.   isosorbide mononitrate (IMDUR) 30 MG 24 hr tablet Take 1 tablet (30 mg total) by mouth daily.   Multiple Vitamin (MULTIVITAMIN WITH MINERALS) TABS tablet Take 1 tablet by mouth  daily.   nitroGLYCERIN (NITROSTAT) 0.4 MG SL tablet DISSOLVE 1 TABLET UNDER THE TONGUE EVERY 5 MINUTES AS NEEDED FOR CHEST PAIN   Omega-3 Fatty Acids (FISH OIL) 1200 MG CAPS Take 1,200 mg by mouth daily.   predniSONE (STERAPRED UNI-PAK 21 TAB) 10 MG (21) TBPK tablet Take as packing directs (Patient not taking: Reported on 12/08/2022)   tiZANidine (ZANAFLEX) 4 MG tablet Take 1 tablet (4 mg total) by mouth 2 (two) times daily as needed for muscle spasms. Primarily at night, since daytime use will make drowsy   No facility-administered encounter medications on file as of 12/10/2022.    Social History: Social History   Tobacco Use   Smoking status: Every Day    Packs/day: 1.00    Years: 40.00    Additional pack years: 0.00    Total pack years: 40.00    Types: Cigarettes   Smokeless tobacco: Never  Vaping Use   Vaping Use: Never used  Substance Use Topics   Alcohol use: No    Alcohol/week: 0.0 standard drinks of alcohol   Drug use: No    Family Medical History: Family History  Problem Relation Age of Onset   Arthritis Father    Coronary artery disease Father    Hypertension  Father    CVA Father 95   Colon cancer Mother    Heart attack Brother    Heart attack Brother    Coronary artery disease Brother    Esophageal cancer Neg Hx    Stomach cancer Neg Hx    Rectal cancer Neg Hx     Physical Examination: There were no vitals filed for this visit.  General: Patient is well developed, well nourished, calm, collected, and in no apparent distress. Attention to examination is appropriate.  Respiratory: Patient is breathing without any difficulty.   NEUROLOGICAL:     Awake, alert, oriented to person, place, and time.  Speech is clear and fluent. Fund of knowledge is appropriate.   Cranial Nerves: Pupils equal round and reactive to light.  Facial tone is symmetric.    *** ROM of cervical spine *** pain *** posterior cervical tenderness. *** tenderness in bilateral trapezial  region.   *** ROM of lumbar spine *** pain *** posterior lumbar tenderness.   No abnormal lesions on exposed skin.   Strength: Side Biceps Triceps Deltoid Interossei Grip Wrist Ext. Wrist Flex.  R 5 5 5 5 5 5 5   L 5 5 5 5 5 5 5    Side Iliopsoas Quads Hamstring PF DF EHL  R 5 5 5 5 5 5   L 5 5 5 5 5 5    Reflexes are ***2+ and symmetric at the biceps, triceps, brachioradialis, patella and achilles.   Hoffman's is absent.  Clonus is not present.   Bilateral upper and lower extremity sensation is intact to light touch.     Gait is normal.   ***No difficulty with tandem gait.    Medical Decision Making  Imaging: Nothing new  Assessment and Plan: Mr. Hafer is a pleasant 62 y.o. male has ***  Treatment options discussed with patient and following plan made:   - Order for physical therapy for *** spine ***. Patient to call to schedule appointment. *** - Continue current medications including ***. Reviewed dosing and side effects.  - Prescription for ***. Reviewed dosing and side effects. Take with food.  - Prescription for *** to take prn muscle spasms. Reviewed dosing and side effects. Discussed this can cause drowsiness.  - MRI of *** to further evaluate *** radiculopathy. No improvement time or medications (***).  - Referral to PMR at [facility] to discuss possible *** injections.  - Will schedule phone visit to review MRI results once I get them back.   I spent a total of *** minutes in face-to-face and non-face-to-face activities related to this patient's care today including review of outside records, review of imaging, review of symptoms, physical exam, discussion of differential diagnosis, discussion of treatment options, and documentation.   Thank you for involving me in the care of this patient.   Drake Leach PA-C Dept. of Neurosurgery

## 2022-12-10 ENCOUNTER — Encounter: Payer: Self-pay | Admitting: Nurse Practitioner

## 2022-12-10 ENCOUNTER — Encounter: Payer: Self-pay | Admitting: Orthopedic Surgery

## 2022-12-10 ENCOUNTER — Telehealth: Payer: Self-pay | Admitting: Family Medicine

## 2022-12-10 ENCOUNTER — Ambulatory Visit: Payer: BC Managed Care – PPO | Admitting: Orthopedic Surgery

## 2022-12-10 VITALS — BP 102/72 | HR 83 | Ht 65.0 in | Wt 124.2 lb

## 2022-12-10 DIAGNOSIS — M5412 Radiculopathy, cervical region: Secondary | ICD-10-CM

## 2022-12-10 DIAGNOSIS — M4722 Other spondylosis with radiculopathy, cervical region: Secondary | ICD-10-CM

## 2022-12-10 DIAGNOSIS — M4802 Spinal stenosis, cervical region: Secondary | ICD-10-CM

## 2022-12-10 DIAGNOSIS — M47812 Spondylosis without myelopathy or radiculopathy, cervical region: Secondary | ICD-10-CM

## 2022-12-10 NOTE — Telephone Encounter (Signed)
Patient's wife contacted the office regarding her husband's visit from 4/9 with [facility]. Says x-ray was done this day and she has some questions regarding his results and would like a call whenever possible, please advise [phone].

## 2022-12-10 NOTE — Patient Instructions (Addendum)
It was so nice to see you today. Thank you so much for coming in.    We know you have some wear and tear in your neck from your MRI done last year.   Since your symptoms are different, I want to get an updated MRI of your neck to look into things further. We will get this approved through your insurance and [facility] will call you to schedule the appointment. The address is [address], [place], [place] [address].   Once I have the MRI results back, we will call you to schedule a phone visit.   Please do not hesitate to call if you have any questions or concerns. You can also message me in MyChart.   If you have not heard back about the MRI in the next week, please call the office so we can help you get it scheduled.   Drake Leach PA-C [phone]

## 2022-12-10 NOTE — Telephone Encounter (Signed)
error 

## 2022-12-11 NOTE — Telephone Encounter (Signed)
I spoke with Isaiah Rangel pts wife (DPR signed); Earlier in week pt was sitting and appeared SOB with grey color and oxygen level was 90%.  Pt saw Audria Nine NP on 12/08/22 and the steroid has helped but pt's oxygen level is still low for him at 92%. Pt has been using nebulizer 2 - 3 times daily and that helps breathing. Pt already has appt with Dr Patsy Lager on 12/14/22 at 3:40 pm but Marin Comment would like to speak with provider about CXR since no pneumonia seen and what to do over weekend until sees Dr Patsy Lager on Mon. Audria Nine NP is not in office and Dr Ermalene Searing is speaking with Isaiah Rangel. Sending note to Dr Ermalene Searing.

## 2022-12-11 NOTE — Telephone Encounter (Signed)
Left message to return call to our office.  

## 2022-12-11 NOTE — Telephone Encounter (Signed)
Spoke with patient's wife in detail.  Patient states that he has had color of mucus change.  Wife states she stopped the antibiotic given she did not think there was an infection ongoing.  He is not acutely short of breath and no chest pain.  I encouraged her to restart the azithromycin and complete the course.  He will complete the course of prednisone and use nebulizers as needed.  If he has continued shortness of breath or if oxygen level drops below 90 he will be seen at an urgent care or if severe symptoms at the emergency rooms. I encouraged her to have him keep the appointment with PCP on Monday for possible discussion of better treatment of COPD as this seems to be what is progressing. She did voice concern for lung cancer but together we reviewed the chest CT for lung cancer screening from July 2023.  Patient's wife voiced appreciation for time spent answering her questions and concerns.

## 2022-12-11 NOTE — Telephone Encounter (Signed)
Pt wife called and stated that his o2 level is running 91 resting and has been sitting of 10 mins. She wants to know if there could be something else going on. Sending to triage.

## 2022-12-14 ENCOUNTER — Encounter: Payer: Self-pay | Admitting: Family Medicine

## 2022-12-14 ENCOUNTER — Ambulatory Visit: Payer: BC Managed Care – PPO | Admitting: Family Medicine

## 2022-12-14 VITALS — BP 90/62 | HR 91 | Temp 98.4°F | Ht 65.0 in | Wt 123.5 lb

## 2022-12-14 DIAGNOSIS — I951 Orthostatic hypotension: Secondary | ICD-10-CM | POA: Diagnosis not present

## 2022-12-14 DIAGNOSIS — J4489 Other specified chronic obstructive pulmonary disease: Secondary | ICD-10-CM | POA: Diagnosis not present

## 2022-12-14 DIAGNOSIS — I2510 Atherosclerotic heart disease of native coronary artery without angina pectoris: Secondary | ICD-10-CM | POA: Diagnosis not present

## 2022-12-14 DIAGNOSIS — E785 Hyperlipidemia, unspecified: Secondary | ICD-10-CM

## 2022-12-14 DIAGNOSIS — E119 Type 2 diabetes mellitus without complications: Secondary | ICD-10-CM

## 2022-12-14 DIAGNOSIS — F411 Generalized anxiety disorder: Secondary | ICD-10-CM

## 2022-12-14 MED ORDER — EZETIMIBE 10 MG PO TABS: 10.0000 mg | ORAL_TABLET | Freq: Every day | ORAL | 3 refills | Status: DC

## 2022-12-14 MED ORDER — FLUDROCORTISONE ACETATE 0.1 MG PO TABS: 200.0000 ug | ORAL_TABLET | Freq: Every day | ORAL | 3 refills | Status: DC

## 2022-12-14 MED ORDER — DICLOFENAC SODIUM 75 MG PO TBEC: DELAYED_RELEASE_TABLET | ORAL | 3 refills | Status: DC

## 2022-12-14 MED ORDER — TIOTROPIUM BROMIDE MONOHYDRATE 18 MCG IN CAPS: 18.0000 ug | ORAL_CAPSULE | Freq: Every day | RESPIRATORY_TRACT | 5 refills | Status: DC

## 2022-12-14 MED ORDER — ESCITALOPRAM OXALATE 20 MG PO TABS: 20.0000 mg | ORAL_TABLET | Freq: Every day | ORAL | 3 refills | Status: DC

## 2022-12-14 NOTE — Telephone Encounter (Signed)
I appreciate Dr. Ermalene Searing for taking the phone call in my absence

## 2022-12-14 NOTE — Progress Notes (Unsigned)
Isaiah Rangel T. Orian Amberg, MD, CAQ Sports Medicine [facility] [address] [place], [address]  Phone: [phone]  FAX: [phone]  Isaiah Rangel - 62 y.o. male  MRN [id number]  Date of Birth: November 12, 1960  Date: 12/14/2022  PCP: Hannah Beat, MD  Referral: Hannah Beat, MD  Chief Complaint  Patient presents with   Medical Management of Chronic Issues   Subjective:   Isaiah Rangel is a 62 y.o. very pleasant male patient with Body mass index is 20.55 kg/m. who presents with the following:  COPD: Currently on Advair 250, albuterol prn, duonebs With smoking. Has no energy, and had two episodes where he could not really breath well.  Duonebs 2 or 3 times a day. Cut back to 3 cigarettes today.   Some chest pain when he gets excited.   With anxiety, too.  His wife thinks that his anxiety is being flared up and when he is having trouble breathing he starts to get an anxiety attack, as well.  Retired, and not able to enjoy the last month, pain and cannot breath.   Currently on Florinef for orthostasis.  Chronic coronary disease.  Review of Systems is noted in the HPI, as appropriate  Objective:   BP 90/62   Pulse 91   Temp 98.4 F (36.9 C) (Temporal)   Ht  (1.651 m)   Wt 123 lb 8 oz (56 kg)   SpO2 95%   BMI 20.55 kg/m   GEN: No acute distress; alert,appropriate. PULM: Breathing comfortably in no respiratory distress PSYCH: Normally interactive.  CV: RRR, no m/g/r  PULM: Normal respiratory rate, no accessory muscle use. No wheezes, crackles or rhonchi   Laboratory and Imaging Data:  Assessment and Plan:     ICD-10-CM   1. COPD (chronic obstructive pulmonary disease) with chronic bronchitis  J44.89     2. Hyperlipidemia LDL goal <70  E78.5 ezetimibe (ZETIA) 10 MG tablet    3. Orthostatic hypotension  I95.1 fludrocortisone (FLORINEF) 0.1 MG tablet    4. Coronary artery disease involving native coronary  artery of native heart without angina pectoris  I25.10     5. Diabetes mellitus type 2, diet-controlled  E11.9      Thanks primary issue right now is incompletely treated COPD with some significant recent hypoxia and shortness of breath.  He did improve after being on antibiotics and some oral steroids.  Feels better today than he did last week.  He is already doing Advair, as well as some DuoNebs.  I am going to add in some scheduled Spiriva.  If he continues to do poorly, then I would want to involve pulmonology.  Trelegy could be an option, but they are hesitant with cost.  Chronic orthostasis, refill Florinef.  Hyperlipidemia, refill Zetia.  Most recent A1c was in the normal range, and his recent CMP at the hospital was also normal blood glucose.  I will see him in 3 months, or sooner if needed.  Medication Management during today's office visit: Meds ordered this encounter  Medications   ezetimibe (ZETIA) 10 MG tablet    Sig: Take 1 tablet (10 mg total) by mouth daily.    Dispense:  90 tablet    Refill:  3    Requesting 1 year supply   diclofenac (VOLTAREN) 75 MG EC tablet    Sig: TAKE 1 TABLET(75 MG) BY MOUTH TWICE DAILY    Dispense:  60 tablet    Refill:  3   fludrocortisone (FLORINEF) 0.1 MG tablet    Sig: Take 2 tablets (200 mcg total) by mouth daily.    Dispense:  180 tablet    Refill:  3    Requesting 1 year supply   tiotropium (SPIRIVA HANDIHALER) 18 MCG inhalation capsule    Sig: Place 1 capsule (18 mcg total) into inhaler and inhale daily.    Dispense:  30 capsule    Refill:  5   escitalopram (LEXAPRO) 20 MG tablet    Sig: Take 1 tablet (20 mg total) by mouth daily.    Dispense:  90 tablet    Refill:  3    Requesting 1 year supply   Medications Discontinued During This Encounter  Medication Reason   diclofenac (VOLTAREN) 75 MG EC tablet Reorder   ezetimibe (ZETIA) 10 MG tablet Reorder   fludrocortisone (FLORINEF) 0.1 MG tablet Reorder   escitalopram  (LEXAPRO) 10 MG tablet Reorder    Orders placed today for conditions managed today: No orders of the defined types were placed in this encounter.   Disposition: Return in about 3 months (around 03/15/2023) for Dr. Patsy Lager diabetes, COPD.  Dragon [employer] One speech-to-text software was used for transcription in this dictation.  Possible transcriptional errors can occur using Animal nutritionist.   Signed,  Elpidio Galea. Annell Canty, MD   Outpatient Encounter Medications as of 12/14/2022  Medication Sig   albuterol (VENTOLIN HFA) 108 (90 Base) MCG/ACT inhaler Inhale 2 puffs into the lungs every 6 (six) hours as needed for wheezing or shortness of breath.   amLODipine (NORVASC) 2.5 MG tablet Take 1 tablet (2.5 mg total) by mouth daily.   aspirin EC 81 MG tablet Take 81 mg by mouth daily.   atorvastatin (LIPITOR) 80 MG tablet Take 1 tablet (80 mg total) by mouth daily.   blood glucose meter kit and supplies KIT Dispense based on patient and insurance preference. Use up to two times daily as directed.   fluticasone-salmeterol (ADVAIR) 250-50 MCG/ACT AEPB Inhale 1 puff into the lungs in the morning and at bedtime.   ibuprofen (ADVIL) 200 MG tablet Take 200-800 mg by mouth every 6 (six) hours as needed for moderate pain.   ipratropium-albuterol (DUONEB) 0.5-2.5 (3) MG/3ML SOLN Take 3 mLs by nebulization every 6 (six) hours as needed.   isosorbide mononitrate (IMDUR) 30 MG 24 hr tablet Take 1 tablet (30 mg total) by mouth daily.   Multiple Vitamin (MULTIVITAMIN WITH MINERALS) TABS tablet Take 1 tablet by mouth daily.   nitroGLYCERIN (NITROSTAT) 0.4 MG SL tablet DISSOLVE 1 TABLET UNDER THE TONGUE EVERY 5 MINUTES AS NEEDED FOR CHEST PAIN   Omega-3 Fatty Acids (FISH OIL) 1200 MG CAPS Take 1,200 mg by mouth daily.   tiotropium (SPIRIVA HANDIHALER) 18 MCG inhalation capsule Place 1 capsule (18 mcg total) into inhaler and inhale daily.   tiZANidine (ZANAFLEX) 4 MG tablet Take 1 tablet (4 mg total) by mouth 2  (two) times daily as needed for muscle spasms. Primarily at night, since daytime use will make drowsy   [DISCONTINUED] diclofenac (VOLTAREN) 75 MG EC tablet TAKE 1 TABLET(75 MG) BY MOUTH TWICE DAILY   [DISCONTINUED] escitalopram (LEXAPRO) 10 MG tablet TAKE 1 TABLET BY MOUTH  DAILY   [DISCONTINUED] ezetimibe (ZETIA) 10 MG tablet Take 1 tablet (10 mg total) by mouth daily.   [DISCONTINUED] fludrocortisone (FLORINEF) 0.1 MG tablet Take 2 tablets (200 mcg total) by mouth daily.   [EXPIRED] azithromycin (ZITHROMAX) 250 MG tablet Take 2 tablets on day  1, then 1 tablet daily on days 2 through 5   diclofenac (VOLTAREN) 75 MG EC tablet TAKE 1 TABLET(75 MG) BY MOUTH TWICE DAILY   escitalopram (LEXAPRO) 20 MG tablet Take 1 tablet (20 mg total) by mouth daily.   ezetimibe (ZETIA) 10 MG tablet Take 1 tablet (10 mg total) by mouth daily.   fludrocortisone (FLORINEF) 0.1 MG tablet Take 2 tablets (200 mcg total) by mouth daily.   [EXPIRED] predniSONE (DELTASONE) 20 MG tablet Take 2 tablets (40 mg total) by mouth daily with breakfast for 5 days.   No facility-administered encounter medications on file as of 12/14/2022.

## 2022-12-15 DIAGNOSIS — F411 Generalized anxiety disorder: Secondary | ICD-10-CM | POA: Insufficient documentation

## 2022-12-18 ENCOUNTER — Ambulatory Visit
Admission: RE | Admit: 2022-12-18 | Discharge: 2022-12-18 | Disposition: A | Discharge status: Home / Self Care | Payer: BC Managed Care – PPO | Source: Ambulatory Visit | Attending: Orthopedic Surgery | Admitting: Orthopedic Surgery

## 2022-12-18 DIAGNOSIS — M4802 Spinal stenosis, cervical region: Secondary | ICD-10-CM

## 2022-12-18 DIAGNOSIS — M47812 Spondylosis without myelopathy or radiculopathy, cervical region: Secondary | ICD-10-CM

## 2022-12-18 DIAGNOSIS — M542 Cervicalgia: Secondary | ICD-10-CM | POA: Diagnosis not present

## 2022-12-18 DIAGNOSIS — M5412 Radiculopathy, cervical region: Secondary | ICD-10-CM

## 2022-12-31 ENCOUNTER — Telehealth: Payer: BC Managed Care – PPO | Admitting: Orthopedic Surgery

## 2022-12-31 NOTE — Progress Notes (Signed)
Telephone Visit- Progress Note: Referring Physician:  Hannah Beat, MD [address] [place],  [place] [address]  Primary Physician:  Hannah Beat, MD  This visit was performed via telephone.  Patient location: home Provider location: office  I spent a total of 10 minutes non-face-to-face activities for this visit on the date of this encounter including review of current clinical condition and response to treatment.    Patient has given verbal consent to this telephone visits and we reviewed the limitations of a telephone visit. Patient wishes to proceed.    Chief Complaint:  f/u MRI   History of Present Illness: Mr. Isaiah Rangel has a history of MI, COPD, GERD, DM, hyperlipidemia, and history of cardiac cath.    Was last seen by Dr. Myer Rangel on 02/17/22 and he was doing well after PT/injection.    He has known history of cervical fusion with cervical spondylosis with moderate cervical stenosis at C3-4 and C4-5 with severe neuroforaminal stenosis at C4-5, C3-4, and C6-7.  Updated cervical MRI ordered at last visit as his pain was severe and he had new dexterity issues.   Phone visit scheduled to review the MRI results.   He is doing much better! He has no neck pain or right arm pain. No numbness, tingling, or weakness in his right arm. He is not having any issues with hand dexterity. He is able to work around the house and has no complaints.   He smokes about a 1/2 ppd.     Conservative measures:  Physical therapy: did last year with no improvement.  Multimodal medical therapy including regular antiinflammatories: tizanidine, norco, prednisone, ibuprofen, diclofenac, methocarbamol, medrol dosepack, percocet  Injections:  Right C4-C5 TF ESI on 02/06/22 Isaiah Rangel  Past Surgery: cervical fusion in 2003 in [place]; lumbar surgery in 1996  [facility] has no symptoms of cervical myelopathy.    Exam: No exam done as this was a telephone encounter.      Imaging: MRI of cervical spine dated 12/18/22:  FINDINGS: Alignment: Straightening of the cervical lordosis. Trace retrolisthesis at C3-4 and C4-5.   Vertebrae: Prior C5-7 ACDF with solid arthrodesis. No acute fracture. No evidence of discitis. No marrow replacing bone lesion.   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 Cord demonstrates otherwise normal signal and morphology.   Posterior Fossa, vertebral arteries, paraspinal tissues: Negative.   Disc levels:   C2-C3: Left greater than right facet and uncovertebral arthropathy resulting in moderate left and mild right foraminal stenosis. No canal stenosis. Unchanged.   C3-C4: Disc osteophyte complex with bilateral facet and uncovertebral arthropathy. Moderate canal stenosis with severe bilateral foraminal stenosis. Unchanged.   C4-C5: Disc osteophyte complex with left greater than right facet and uncovertebral arthropathy contributing to mild-moderate canal stenosis with severe left and mild-moderate right foraminal stenosis. Unchanged.   C5-C6: Prior ACDF. Facet and uncovertebral hypertrophy contributes to moderate right and mild left foraminal stenosis. No canal stenosis. Unchanged.   C6-C7: Prior ACDF. Bilateral uncovertebral spurring contributes to bilateral moderate-severe foraminal stenosis without canal stenosis. Unchanged.   C7-T1: No significant disc protrusion, foraminal stenosis, or canal stenosis.   IMPRESSION: 1. Multilevel cervical spondylosis without significant interval progression compared to the previous MRI. 2. Moderate canal stenosis with severe bilateral foraminal stenosis at C3-4. 3. Additional levels of canal and foraminal narrowing, as above. 4. Unchanged syrinx at the C6 and C7 levels.  Electronically Signed   By: Isaiah Rangel D.O.   On: 12/23/2022 09:37  I have  personally reviewed the images and agree with the above interpretation.  Assessment and Plan: Mr. Isaiah Rangel is a pleasant 62 y.o. male with resolution of his neck and right arm pain. He has no current pain. No numbness, tingling, or weakness. No issues with hand dexterity.   He has known history of cervical fusion with cervical spondylosis with moderate cervical stenosis at C3-4 and C4-5 with severe neuroforaminal stenosis at C4-5, C3-4, and C6-7. Above MRI shows no significant change from previous MRI.    Treatment options discussed with patient and following plan made:   - Hold on further treatment at this time as he has no pain.  - Continue with activity as tolerated. He is now enjoying retirement as he can do things without pain.  - If pain returns, consider repeat cervical injections.  - He will f/u prn.   Isaiah Leach PA-C Neurosurgery

## 2023-01-04 ENCOUNTER — Encounter: Payer: Self-pay | Admitting: Orthopedic Surgery

## 2023-01-04 ENCOUNTER — Ambulatory Visit (INDEPENDENT_AMBULATORY_CARE_PROVIDER_SITE_OTHER): Payer: Self-pay | Admitting: Orthopedic Surgery

## 2023-01-04 DIAGNOSIS — M47812 Spondylosis without myelopathy or radiculopathy, cervical region: Secondary | ICD-10-CM

## 2023-01-04 DIAGNOSIS — Z981 Arthrodesis status: Secondary | ICD-10-CM

## 2023-01-04 DIAGNOSIS — M4802 Spinal stenosis, cervical region: Secondary | ICD-10-CM

## 2023-01-07 ENCOUNTER — Other Ambulatory Visit: Payer: Self-pay | Admitting: Family Medicine

## 2023-02-03 ENCOUNTER — Other Ambulatory Visit: Payer: Self-pay | Admitting: Acute Care

## 2023-02-03 DIAGNOSIS — Z122 Encounter for screening for malignant neoplasm of respiratory organs: Secondary | ICD-10-CM

## 2023-02-03 DIAGNOSIS — F17210 Nicotine dependence, cigarettes, uncomplicated: Secondary | ICD-10-CM

## 2023-03-22 ENCOUNTER — Ambulatory Visit: Payer: Self-pay

## 2023-06-04 ENCOUNTER — Other Ambulatory Visit: Payer: Self-pay | Admitting: Family Medicine

## 2023-07-05 ENCOUNTER — Other Ambulatory Visit: Payer: Self-pay | Admitting: Family Medicine

## 2023-07-06 NOTE — Telephone Encounter (Signed)
Last office visit 12/14/2022 for Medical Management for Chronic Issues.  Last refilled 06/04/23 for #60 with no refills.  Next Appt: No future appointments.

## 2023-08-30 ENCOUNTER — Encounter: Payer: Self-pay | Admitting: *Deleted

## 2023-09-17 ENCOUNTER — Telehealth: Payer: Self-pay | Admitting: Family Medicine

## 2023-09-17 DIAGNOSIS — I951 Orthostatic hypotension: Secondary | ICD-10-CM

## 2023-09-17 DIAGNOSIS — I2510 Atherosclerotic heart disease of native coronary artery without angina pectoris: Secondary | ICD-10-CM

## 2023-09-17 DIAGNOSIS — E785 Hyperlipidemia, unspecified: Secondary | ICD-10-CM

## 2023-09-17 MED ORDER — FLUDROCORTISONE ACETATE 0.1 MG PO TABS: 200.0000 ug | ORAL_TABLET | Freq: Every day | ORAL | 0 refills | Status: DC

## 2023-09-17 MED ORDER — ISOSORBIDE MONONITRATE ER 30 MG PO TB24: 30.0000 mg | ORAL_TABLET | Freq: Every day | ORAL | 0 refills | Status: DC

## 2023-09-17 MED ORDER — AMLODIPINE BESYLATE 2.5 MG PO TABS: 2.5000 mg | ORAL_TABLET | Freq: Every day | ORAL | 0 refills | Status: DC

## 2023-09-17 MED ORDER — EZETIMIBE 10 MG PO TABS: 10.0000 mg | ORAL_TABLET | Freq: Every day | ORAL | 0 refills | Status: DC

## 2023-09-17 MED ORDER — DICLOFENAC SODIUM 75 MG PO TBEC: DELAYED_RELEASE_TABLET | ORAL | 0 refills | Status: DC

## 2023-09-17 MED ORDER — ATORVASTATIN CALCIUM 80 MG PO TABS: 80.0000 mg | ORAL_TABLET | Freq: Every day | ORAL | 0 refills | Status: DC

## 2023-09-17 MED ORDER — ESCITALOPRAM OXALATE 20 MG PO TABS: 20.0000 mg | ORAL_TABLET | Freq: Every day | ORAL | 0 refills | Status: DC

## 2023-09-17 NOTE — Telephone Encounter (Signed)
Refills sent as requested.  Please schedule CPE with fasting labs prior with Dr. Patsy Lager after 09/22/23.

## 2023-09-17 NOTE — Addendum Note (Signed)
Addended by: Damita Lack on: 09/17/2023 02:15 PM   Modules accepted: Orders

## 2023-09-17 NOTE — Telephone Encounter (Signed)
Copied from CRM [id number]. Topic: Clinical - Prescription Issue >> Sep 17, 2023  1:34 PM Theodis Sato wrote: Reason for CRM: Patients wife is requesting Dr. Patsy Lager to send the following medication to patients new preferred pharmacy as required by insurance. Patients wife states he is out of the medications as well  ezetimibe (ZETIA) 10 MG tablet amLODipine (NORVASC) 2.5 MG tablet diclofenac (VOLTAREN) 75 MG EC tablet  fludrocortisone (FLORINEF) 0.1 MG tablet  isosorbide mononitrate (IMDUR) 30 MG 24 hr table atorvastatin (LIPITOR) 80 MG tablet   escitalopram (LEXAPRO) 20 MG tablet (not out of this med but is requesting it be sent as well)   Please send to [id number] [place], [place] - [address] [place] [address] Phone: [phone] Fax: [phone]

## 2023-09-17 NOTE — Telephone Encounter (Signed)
lvm for pt to call office to schedule cpe /labs prior

## 2023-10-03 NOTE — Progress Notes (Signed)
 Isaiah Lamountain T. Zakariye Nee, MD, CAQ Sports Medicine [facility] [address] [place], [address]  Phone: [phone]  FAX: [phone]  Isaiah Rangel - 63 y.o. male  MRN [id number]  Date of Birth: [birth year]  Date: 10/06/2023  PCP: [name], MD  Referral: [name], MD  Chief Complaint  Patient presents with   Annual Exam   Patient Care Team: [name], MD as PCP - General Perla Evalene PARAS, MD as Consulting Physician (Cardiology) Subjective:   Isaiah Rangel is a 63 y.o. pleasant patient who presents with the following:  Preventative Health Maintenance Visit:  Health Maintenance Summary Reviewed and updated, unless pt declines services.  Tobacco History Reviewed.  1/2 PPD Alcohol: No concerns, no excessive use - none Exercise Habits: Some activity, rec at least 30 mins 5 times a week STD concerns: no risk or activity to increase risk Drug Use: None  Isaiah Rangel is a patient who is known very well for many years.  He has a lot of ongoing medical problems including cerebrovascular disease, coronary disease, history of NSTEMI, COPD, type 2 diabetes, hyperlipidemia, and he is a longstanding smoker.  Flu Prevnar-20  Diabetes Mellitus: Tolerating Medications: yes Compliance with diet: fair, Body mass index is 21.21 kg/m. Exercise: minimal / intermittent Avg blood sugars at home: not checking Foot problems: none Hypoglycemia: none No nausea, vomitting, blurred vision, polyuria.  Lab Results  Component Value Date   HGBA1C 6.2 (A) 08/27/2022   HGBA1C 6.2 (A) 09/15/2021   HGBA1C 6.6 (H) 01/29/2021   Lab Results  Component Value Date   MICROALBUR 4.0 (H) 09/15/2022   LDLCALC 63 09/15/2022   CREATININE 0.81 11/09/2022   From a COPD standpoint, he does take Advair 250 twice a day, as needed albuterol , and DuoNebs.  He also does Spiriva  every day.  Wt Readings from Last 3 Encounters:  10/06/23 126 lb 8 oz (57.4 kg)   12/14/22 123 lb 8 oz (56 kg)  12/10/22 124 lb 3.2 oz (56.3 kg)     Health Maintenance  Topic Date Due   COVID-19 Vaccine (3 - Moderna risk series) 02/09/2020   HEMOGLOBIN A1C  02/26/2023   Lung Cancer Screening  03/18/2023   OPHTHALMOLOGY EXAM  04/18/2023   Diabetic kidney evaluation - Urine ACR  09/16/2023   Colonoscopy  12/13/2023   Diabetic kidney evaluation - eGFR measurement  11/09/2023   FOOT EXAM  10/05/2024   DTaP/Tdap/Td (2 - Td or Tdap) 06/22/2026   Pneumococcal Vaccine 36-54 Years old  Completed   INFLUENZA VACCINE  Completed   Hepatitis C Screening  Completed   HIV Screening  Completed   Zoster Vaccines- Shingrix  Completed   HPV VACCINES  Aged Out   Immunization History  Administered Date(s) Administered   Influenza Inj Mdck Quad Pf 10/09/2019   Influenza Whole 07/09/2010   Influenza, Seasonal, Injecte, Preservative Fre 06/18/2015, 10/06/2023   Influenza,inj,Quad PF,6+ Mos 06/22/2016, 06/13/2020   Moderna Sars-Covid-2 Vaccination 12/15/2019, 01/12/2020   PNEUMOCOCCAL CONJUGATE-20 10/06/2023   Pneumococcal Polysaccharide-23 10/09/2019   Tdap 06/22/2016   Zoster Recombinant(Shingrix) 02/05/2021, 09/15/2021   Patient Active Problem List   Diagnosis Date Noted   NSTEMI (non-ST elevated myocardial infarction) (HCC) 03/20/2019    Priority: High   Coronary artery disease involving native coronary artery of native heart without angina pectoris 02/21/2016    Priority: High   Cerebrovascular disease (but no CVA) 04/29/2015    Priority: High   GAD (generalized anxiety disorder) 12/15/2022  Priority: Medium    Diabetes mellitus type 2, diet-controlled (HCC) 02/05/2021    Priority: Medium    COPD (chronic obstructive pulmonary disease) with chronic bronchitis (HCC) 09/05/2018    Priority: Medium    S/P cardiac catheterization 04/19/2013    Priority: Medium    Smoking 09/09/2011    Priority: Medium    Mixed hyperlipidemia 04/09/2010    Priority: Medium     Nephrolithiasis 06/13/2020   Depression    Acute ischemic enteritis (HCC) 11/26/2017   GERD 09/21/2008   ERECTILE DYSFUNCTION 09/20/2008    Past Medical History:  Diagnosis Date   Basal cell carcinoma    BPH (benign prostatic hyperplasia)    Cerebrovascular disease (but no CVA) 04/29/2015   COPD (chronic obstructive pulmonary disease) (HCC)    Coronary artery disease/Coronary Vasospasm    a. 03/2013 Cath: min irregs. EF >55%; b. 03/2019 Cath: LM nl, LAD nl, LCX nl, OM1 40, RCA 30p, 75m-->40% after IC NTG; c. 12/2019 MV: EF 65%, no ischemia/infarct-->low risk.   Diabetes mellitus type 2, diet-controlled (HCC) 02/05/2021   Diastolic dysfunction    a. 03/2019 Echo: EF 50-55%, nl RV size/fxn. Trace MR/TR; b. 09/2019 Echo: EF 60-65%, mild LVH, GrI DD, no rwma, triv MR, mild AoV sclerosis w/o stenosis.   GAD (generalized anxiety disorder)    GERD (gastroesophageal reflux disease)    History of kidney stones    Hyperlipidemia    Myocardial infarction (HCC) 03/2019   NO STENTS   Squamous cell carcinoma of skin    Tobacco abuse     Past Surgical History:  Procedure Laterality Date   BACK SURGERY  1998   NO METAL   CARDIAC CATHETERIZATION  04/06/13   [facility]- minor luminal irregularities, otherwise normal cors; EF > 55%. Medical management and smoking cessation recommended   CATARACT EXTRACTION W/PHACO Right 08/28/2020   Procedure: CATARACT EXTRACTION PHACO AND INTRAOCULAR LENS PLACEMENT (IOC) RIGHT [name];  Surgeon: Mittie Gaskin, MD;  Location: [facility];  Service: Ophthalmology;  Laterality: Right;  1.77 0:31.9 5.6%   CYSTOSCOPY/URETEROSCOPY/HOLMIUM LASER/STENT PLACEMENT Left 06/13/2020   Procedure: CYSTOSCOPY/URETEROSCOPY//STENT PLACEMENT;  Surgeon: Penne Knee, MD;  Location: [facility];  Service: Urology;  Laterality: Left;   CYSTOSCOPY/URETEROSCOPY/HOLMIUM LASER/STENT PLACEMENT Left 06/24/2020   Procedure: CYSTOSCOPY/URETEROSCOPY/HOLMIUM LASER/STENT PLACEMENT;   Surgeon: Penne Knee, MD;  Location: [facility];  Service: Urology;  Laterality: Left;   KNEE ARTHROSCOPY W/ PARTIAL MEDIAL MENISCECTOMY Right 2012   [place], 2012   KNEE ARTHROSCOPY WITH MEDIAL MENISECTOMY Left 11/13/2020   Procedure: Left knee arthroscopy with partial medial or lateral menisectomy, possible chondroplasty, possible partial synovectomy;  Surgeon: Leora Lynwood SAUNDERS, MD;  Location: [facility];  Service: Orthopedics;  Laterality: Left;   LEFT HEART CATH AND CORONARY ANGIOGRAPHY N/A 03/20/2019   Procedure: LEFT HEART CATH AND CORONARY ANGIOGRAPHY;  Surgeon: Burnard Debby LABOR, MD;  Location: [facility];  Service: Cardiovascular;  Laterality: N/A;   NASAL SINUS SURGERY     neck fusion  2000   TONSILLECTOMY     VASECTOMY      Family History  Problem Relation Age of Onset   Arthritis Father    Coronary artery disease Father    Hypertension Father    CVA Father 73   Colon cancer Mother    Heart attack Brother    Heart attack Brother    Coronary artery disease Brother    Esophageal cancer Neg Hx    Stomach cancer Neg Hx    Rectal cancer Neg  Hx     Social History   Social History Narrative   Son of Marinell Mosso([employer])      Likes to work out       Lives with GF    Past Medical History, Surgical History, Social History, Family History, Problem List, Medications, and Allergies have been reviewed and updated if relevant.  Review of Systems: Pertinent positives are listed above.  Otherwise, a full 14 point review of systems has been done in full and it is negative except where it is noted positive.  Objective:   BP 130/70 (BP Location: Left Arm, Patient Position: Sitting, Cuff Size: Normal)   Pulse 61   Temp 98.2 F (36.8 C) (Temporal)   Ht 5' 4.75 (1.645 m)   Wt 126 lb 8 oz (57.4 kg)   SpO2 98%   BMI 21.21 kg/m  Ideal Body Weight: Weight in (lb) to have BMI = 25: 148.8  Ideal Body Weight: Weight in (lb) to have BMI = 25: 148.8 No results found.     12/08/2022    9:51 AM 09/21/2022    9:07 AM 02/05/2021    2:35 PM 10/09/2019    3:34 PM 09/05/2018    9:50 AM  Depression screen PHQ 2/9  Decreased Interest 0 1 0 0 0  Down, Depressed, Hopeless 0 0 0 0 0  PHQ - 2 Score 0 1 0 0 0  Altered sleeping 0  0    Tired, decreased energy 0  1    Change in appetite 0  1    Feeling bad or failure about yourself  0  0    Trouble concentrating 0  0    Moving slowly or fidgety/restless 0  0    Suicidal thoughts 0  0    PHQ-9 Score 0  2    Difficult doing work/chores Not difficult at all  Not difficult at all       GEN: well developed, well nourished, no acute distress Eyes: conjunctiva and lids normal, PERRLA, EOMI ENT: TM clear, nares clear, oral exam WNL Neck: supple, no lymphadenopathy, no thyromegaly, no JVD Pulm: clear to auscultation and percussion, respiratory effort normal CV: regular rate and rhythm, S1-S2, no murmur, rub or gallop, no bruits, peripheral pulses normal and symmetric, no cyanosis, clubbing, edema or varicosities GI: soft, non-tender; no hepatosplenomegaly, masses; active bowel sounds all quadrants GU: deferred Lymph: no cervical, axillary or inguinal adenopathy MSK: gait normal, muscle tone and strength WNL, no joint swelling, effusions, discoloration, crepitus  SKIN: clear, good turgor, color WNL, no rashes, lesions, or ulcerations Neuro: normal mental status, normal strength, sensation, and motion Psych: alert; oriented to person, place and time, normally interactive and not anxious or depressed in appearance.  All labs reviewed with patient. Results for orders placed or performed during the hospital encounter of 11/09/22  CBC   Collection Time: 11/09/22 12:46 AM  Result Value Ref Range   WBC 15.2 (H) 4.0 - 10.5 K/uL   RBC 4.12 (L) 4.22 - 5.81 MIL/uL   Hemoglobin 13.0 13.0 - 17.0 g/dL   HCT 60.5 60.9 - 47.9 %   MCV 95.6 80.0 - 100.0 fL   MCH 31.6 26.0 - 34.0 pg   MCHC 33.0 30.0 - 36.0 g/dL   RDW 83.2 (H) 88.4 - 84.4  %   Platelets 248 150 - 400 K/uL   nRBC 0.0 0.0 - 0.2 %  Comprehensive metabolic panel   Collection Time: 11/09/22 12:46 AM  Result Value  Ref Range   Sodium 137 135 - 145 mmol/L   Potassium 4.3 3.5 - 5.1 mmol/L   Chloride 106 98 - 111 mmol/L   CO2 28 22 - 32 mmol/L   Glucose, Bld 99 70 - 99 mg/dL   BUN 17 8 - 23 mg/dL   Creatinine, Ser 9.18 0.61 - 1.24 mg/dL   Calcium  8.9 8.9 - 10.3 mg/dL   Total Protein 7.0 6.5 - 8.1 g/dL   Albumin 4.0 3.5 - 5.0 g/dL   AST 28 15 - 41 U/L   ALT 29 0 - 44 U/L   Alkaline Phosphatase 81 38 - 126 U/L   Total Bilirubin 0.8 0.3 - 1.2 mg/dL   GFR, Estimated >39 >39 mL/min   Anion gap 3 (L) 5 - 15  Lipase, blood   Collection Time: 11/09/22 12:46 AM  Result Value Ref Range   Lipase 42 11 - 51 U/L  Troponin I (High Sensitivity)   Collection Time: 11/09/22 12:46 AM  Result Value Ref Range   Troponin I (High Sensitivity) 3 <18 ng/L  Resp panel by RT-PCR (RSV, Flu A&B, Covid) Anterior Nasal Swab   Collection Time: 11/09/22  1:35 AM   Specimen: Anterior Nasal Swab  Result Value Ref Range   SARS Coronavirus 2 by RT PCR NEGATIVE NEGATIVE   Influenza A by PCR NEGATIVE NEGATIVE   Influenza B by PCR NEGATIVE NEGATIVE   Resp Syncytial Virus by PCR NEGATIVE NEGATIVE  D-dimer, quantitative   Collection Time: 11/09/22  1:35 AM  Result Value Ref Range   D-Dimer, Quant 0.47 0.00 - 0.50 ug/mL-FEU  Troponin I (High Sensitivity)   Collection Time: 11/09/22  2:46 AM  Result Value Ref Range   Troponin I (High Sensitivity) 3 <18 ng/L    Assessment and Plan:     ICD-10-CM   1. Healthcare maintenance  Z00.00     2. Hyperlipidemia LDL goal <70  E78.5 Lipid panel    Basic metabolic panel    Hepatic Function Panel    3. Diabetes mellitus type 2, diet-controlled (HCC)  E11.9 Hemoglobin A1c    Microalbumin / creatinine urine ratio    4. Screening PSA (prostate specific antigen)  Z12.5 PSA, Total with Reflex to PSA, Free    5. Encounter for long-term (current)  use of medications  Z79.899 CBC with Differential/Platelet    6. Smoker  F17.200 Ambulatory Referral Lung Cancer Screening [facility]    7. Screening for malignant neoplasm of colon  Z12.11 Ambulatory referral to Gastroenterology    8. Need for influenza vaccination  Z23 Flu vaccine trivalent PF, 6mos and older(Flulaval,Afluria,Fluarix,Fluzone)    9. Need for vaccination against Streptococcus pneumoniae  Z23 Pneumococcal conjugate vaccine 20-valent (Prevnar 20)     Basically, he has been doing well.  He is having minimal chest point, and thinks that the extended release nitroglycerin  is making a difference.  We will check all of his labs today.  Diabetes has been under good control.  We will get Prevnar and influenza vaccination today.  Consult GI for repeat colonoscopy.  Family history.  I urged him to continue to work on cutting back and quitting smoking.  Health Maintenance Exam: The patient's preventative maintenance and recommended screening tests for an annual wellness exam were reviewed in full today. Brought up to date unless services declined.  Counselled on the importance of diet, exercise, and its role in overall health and mortality. The patient's FH and SH was reviewed, including their home life, tobacco  status, and drug and alcohol status.  Follow-up in 1 year for physical exam or additional follow-up below.  Disposition: No follow-ups on file.  No orders of the defined types were placed in this encounter.  There are no discontinued medications. Orders Placed This Encounter  Procedures   Flu vaccine trivalent PF, 6mos and older(Flulaval,Afluria,Fluarix,Fluzone)   Pneumococcal conjugate vaccine 20-valent (Prevnar 20)   Hemoglobin A1c   Lipid panel   Microalbumin / creatinine urine ratio   CBC with Differential/Platelet   Basic metabolic panel   Hepatic Function Panel   PSA, Total with Reflex to PSA, Free   Ambulatory Referral Lung Cancer Screening  [facility]   Ambulatory referral to Gastroenterology    Signed,  Jacques T. Krizia Flight, MD   Allergies as of 10/06/2023       Reactions   Other Nausea And Vomiting, Other (See Comments)   Quail eggs caused severe stomach pain and n/v        Medication List        Accurate as of October 06, 2023 11:04 AM. If you have any questions, ask your nurse or doctor.          albuterol  108 (90 Base) MCG/ACT inhaler Commonly known as: VENTOLIN  HFA Inhale 2 puffs into the lungs every 6 (six) hours as needed for wheezing or shortness of breath.   amLODipine  2.5 MG tablet Commonly known as: NORVASC  Take 1 tablet (2.5 mg total) by mouth daily.   aspirin  EC 81 MG tablet Take 81 mg by mouth daily.   atorvastatin  80 MG tablet Commonly known as: LIPITOR Take 1 tablet (80 mg total) by mouth daily.   blood glucose meter kit and supplies Kit Dispense based on patient and insurance preference. Use up to two times daily as directed.   diclofenac  75 MG EC tablet Commonly known as: VOLTAREN  TAKE 1 TABLET(75 MG) BY MOUTH TWICE DAILY   escitalopram  20 MG tablet Commonly known as: LEXAPRO  Take 1 tablet (20 mg total) by mouth daily.   ezetimibe  10 MG tablet Commonly known as: ZETIA  Take 1 tablet (10 mg total) by mouth daily.   Fish Oil 1200 MG Caps Take 1,200 mg by mouth daily.   fludrocortisone  0.1 MG tablet Commonly known as: FLORINEF  Take 2 tablets (200 mcg total) by mouth daily.   fluticasone -salmeterol 250-50 MCG/ACT Aepb Commonly known as: ADVAIR Inhale 1 puff into the lungs in the morning and at bedtime.   ibuprofen 200 MG tablet Commonly known as: ADVIL Take 200-800 mg by mouth every 6 (six) hours as needed for moderate pain.   ipratropium-albuterol  0.5-2.5 (3) MG/3ML Soln Commonly known as: DUONEB Take 3 mLs by nebulization every 6 (six) hours as needed.   isosorbide  mononitrate 30 MG 24 hr tablet Commonly known as: IMDUR  Take 1 tablet (30 mg total) by mouth  daily.   multivitamin with minerals Tabs tablet Take 1 tablet by mouth daily.   nitroGLYCERIN  0.4 MG SL tablet Commonly known as: NITROSTAT  DISSOLVE 1 TABLET UNDER THE TONGUE EVERY 5 MINUTES AS NEEDED FOR CHEST PAIN   tiotropium 18 MCG inhalation capsule Commonly known as: Spiriva  HandiHaler Place 1 capsule (18 mcg total) into inhaler and inhale daily.   tiZANidine  4 MG tablet Commonly known as: ZANAFLEX  Take 1 tablet (4 mg total) by mouth 2 (two) times daily as needed for muscle spasms. Primarily at night, since daytime use will make drowsy

## 2023-10-06 ENCOUNTER — Ambulatory Visit (INDEPENDENT_AMBULATORY_CARE_PROVIDER_SITE_OTHER): Payer: Self-pay | Admitting: Family Medicine

## 2023-10-06 ENCOUNTER — Encounter: Payer: Self-pay | Admitting: Family Medicine

## 2023-10-06 VITALS — BP 130/70 | HR 61 | Temp 98.2°F | Ht 64.75 in | Wt 126.5 lb

## 2023-10-06 DIAGNOSIS — Z0000 Encounter for general adult medical examination without abnormal findings: Secondary | ICD-10-CM | POA: Diagnosis not present

## 2023-10-06 DIAGNOSIS — Z79899 Other long term (current) drug therapy: Secondary | ICD-10-CM | POA: Diagnosis not present

## 2023-10-06 DIAGNOSIS — Z125 Encounter for screening for malignant neoplasm of prostate: Secondary | ICD-10-CM

## 2023-10-06 DIAGNOSIS — Z23 Encounter for immunization: Secondary | ICD-10-CM

## 2023-10-06 DIAGNOSIS — E119 Type 2 diabetes mellitus without complications: Secondary | ICD-10-CM | POA: Diagnosis not present

## 2023-10-06 DIAGNOSIS — F17200 Nicotine dependence, unspecified, uncomplicated: Secondary | ICD-10-CM | POA: Diagnosis not present

## 2023-10-06 DIAGNOSIS — E785 Hyperlipidemia, unspecified: Secondary | ICD-10-CM | POA: Diagnosis not present

## 2023-10-06 DIAGNOSIS — Z1211 Encounter for screening for malignant neoplasm of colon: Secondary | ICD-10-CM

## 2023-10-06 LAB — BASIC METABOLIC PANEL
BUN: 15 mg/dL (ref 6–23)
CO2: 33 meq/L — ABNORMAL HIGH (ref 19–32)
Calcium: 8.8 mg/dL (ref 8.4–10.5)
Chloride: 106 meq/L (ref 96–112)
Creatinine, Ser: 0.84 mg/dL (ref 0.40–1.50)
GFR: 92.81 mL/min (ref >60.00)
Glucose, Bld: 122 mg/dL — ABNORMAL HIGH (ref 70–99)
Potassium: 3.7 meq/L (ref 3.5–5.1)
Sodium: 145 meq/L (ref 135–145)

## 2023-10-06 LAB — CBC WITH DIFFERENTIAL/PLATELET
Basophils Absolute: 0.2 10*3/uL — ABNORMAL HIGH (ref 0.0–0.1)
Basophils Relative: 2.3 % (ref 0.0–3.0)
Eosinophils Absolute: 0.5 10*3/uL (ref 0.0–0.7)
Eosinophils Relative: 6 % — ABNORMAL HIGH (ref 0.0–5.0)
HCT: 38.1 % — ABNORMAL LOW (ref 39.0–52.0)
Hemoglobin: 12.9 g/dL — ABNORMAL LOW (ref 13.0–17.0)
Lymphocytes Relative: 31.4 % (ref 12.0–46.0)
Lymphs Abs: 2.4 10*3/uL (ref 0.7–4.0)
MCHC: 33.9 g/dL (ref 30.0–36.0)
MCV: 95.6 fL (ref 78.0–100.0)
Monocytes Absolute: 0.6 10*3/uL (ref 0.1–1.0)
Monocytes Relative: 8.2 % (ref 3.0–12.0)
Neutro Abs: 4 10*3/uL (ref 1.4–7.7)
Neutrophils Relative %: 52.1 % (ref 43.0–77.0)
Platelets: 223 10*3/uL (ref 150.0–400.0)
RBC: 3.99 Mil/uL — ABNORMAL LOW (ref 4.22–5.81)
RDW: 18 % — ABNORMAL HIGH (ref 11.5–15.5)
WBC: 7.7 10*3/uL (ref 4.0–10.5)

## 2023-10-06 LAB — LIPID PANEL
Cholesterol: 92 mg/dL (ref 0–200)
HDL: 39.3 mg/dL (ref >39.00)
LDL Cholesterol: 42 mg/dL (ref 0–99)
NonHDL: 52.51
Total CHOL/HDL Ratio: 2
Triglycerides: 52 mg/dL (ref 0.0–149.0)
VLDL: 10.4 mg/dL (ref 0.0–40.0)

## 2023-10-06 LAB — MICROALBUMIN / CREATININE URINE RATIO
Creatinine,U: 122 mg/dL
Microalb Creat Ratio: 4.9 mg/g (ref 0.0–30.0)
Microalb, Ur: 6 mg/dL — ABNORMAL HIGH (ref 0.0–1.9)

## 2023-10-06 LAB — HEPATIC FUNCTION PANEL
ALT: 32 U/L (ref 0–53)
AST: 22 U/L (ref 0–37)
Albumin: 4.3 g/dL (ref 3.5–5.2)
Alkaline Phosphatase: 81 U/L (ref 39–117)
Bilirubin, Direct: 0.1 mg/dL (ref 0.0–0.3)
Total Bilirubin: 0.6 mg/dL (ref 0.2–1.2)
Total Protein: 6.2 g/dL (ref 6.0–8.3)

## 2023-10-06 LAB — HEMOGLOBIN A1C: Hgb A1c MFr Bld: 6.5 % (ref 4.6–6.5)

## 2023-10-07 LAB — PSA, TOTAL WITH REFLEX TO PSA, FREE: PSA, Total: 0.7 ng/mL (ref <=4.0)

## 2023-10-08 ENCOUNTER — Encounter: Payer: Self-pay | Admitting: Family Medicine

## 2023-10-15 ENCOUNTER — Other Ambulatory Visit: Payer: Self-pay | Admitting: Family Medicine

## 2023-10-15 NOTE — Telephone Encounter (Signed)
Last office visit 10/03/23 for CPE.  Last refilled 09/17/2023 for #60 with no refills.  Next appt: No future appointments.

## 2023-11-11 ENCOUNTER — Encounter: Payer: Self-pay | Admitting: Family Medicine

## 2023-11-14 ENCOUNTER — Other Ambulatory Visit: Payer: Self-pay | Admitting: Family Medicine

## 2023-11-14 DIAGNOSIS — E785 Hyperlipidemia, unspecified: Secondary | ICD-10-CM

## 2023-11-14 DIAGNOSIS — I2510 Atherosclerotic heart disease of native coronary artery without angina pectoris: Secondary | ICD-10-CM

## 2023-12-05 ENCOUNTER — Other Ambulatory Visit: Payer: Self-pay | Admitting: Family Medicine

## 2023-12-05 DIAGNOSIS — I951 Orthostatic hypotension: Secondary | ICD-10-CM

## 2023-12-06 NOTE — Telephone Encounter (Signed)
 Last office visit 10/06/2023 for CPE.  Last refilled 09/17/2023 for #180 with no refills.  Next Appt: No future appointments.

## 2023-12-27 NOTE — Progress Notes (Unsigned)
 Cardiology Office Note    Date:  12/28/2023   ID:  Isaiah Rangel, DOB 1961/01/24, MRN [id number]  PCP:  Scherrie Curt, MD  Cardiologist:  Belva Boyden, MD  Electrophysiologist:  None   Chief Complaint: Follow-up  History of Present Illness:   Isaiah Rangel is a 63 y.o. 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  He underwent cardiac cath in 03/2013 in the setting of chest pain, despite normal stress testing, that showed minor luminal irregularities.  He continued to have intermittent chest discomfort multiple times per week and was medically managed with long-acting nitrates.  In 03/2019 he had sudden onset of chest pain and was admitted to [facility] with NSTEMI with peak high-sensitivity troponin of 157.  Diagnostic LHC initially showed mid RCA stenosis.  However, after intracoronary nitroglycerin  the vessel dilated and was only 40% stenosed.  He has been medically managed since with long-acting nitrate and calcium  channel blocker.  He was admitted in 09/2019 with chest pain in the setting of nonadherence to nitrate therapy and ruled out.  Echo in 09/2019 showed normal LV systolic function with an EF of 60 to 65%, mild LVH, grade 1 diastolic dysfunction, and no significant valvular abnormalities.  He subsequently underwent stress testing in 12/2019 that was low risk and without evidence of ischemia or infarct.  He was last seen in the office in 08/2022 and was doing well from a cardiac perspective.  He continued to smoke less than 1 pack/day.  Well from a cardiac perspective and is without symptoms of angina or cardiac decompensation.  Chronic dyspnea is stable and attributed to underlying COPD.  He continues to smoke, less than a pack per day.  No significant lower extremity  swelling or progressive orthopnea.  No falls or symptoms concerning for bleeding.  Main limiting factor at this time is back pain.  No dizziness, presyncope, or syncope.  Overall feels well from a cardiac perspective.   Labs independently reviewed: 10/2023 - AST/ALT normal, albumin 4.3, potassium 3.7, BUN 15, serum creatinine 0.84, Hgb 12.9, PLT 223, TC 92, TG 52, HDL 39, LDL 42, A1c 6.5 12/2019 - TSH normal  Past Medical History:  Diagnosis Date   Basal cell carcinoma    BPH (benign prostatic hyperplasia)    Cerebrovascular disease (but no CVA) 04/29/2015   COPD (chronic obstructive pulmonary disease) (HCC)    Coronary artery disease/Coronary Vasospasm    a. 03/2013 Cath: min irregs. EF >55%; b. 03/2019 Cath: LM nl, LAD nl, LCX nl, OM1 40, RCA 30p, 70m-->40% after IC NTG; c. 12/2019 MV: EF 65%, no ischemia/infarct-->low risk.   Diabetes mellitus type 2, diet-controlled (HCC) 02/05/2021   Diastolic dysfunction    a. 03/2019 Echo: EF 50-55%, nl RV size/fxn. Trace MR/TR; b. 09/2019 Echo: EF 60-65%, mild LVH, GrI DD, no rwma, triv MR, mild AoV sclerosis w/o stenosis.   GAD (generalized anxiety disorder)    GERD (gastroesophageal reflux disease)    History of kidney stones    Hyperlipidemia    Myocardial infarction (HCC) 03/2019   NO STENTS   Squamous cell carcinoma of skin    Tobacco abuse     Past Surgical History:  Procedure Laterality Date   BACK SURGERY  1998   NO METAL   CARDIAC CATHETERIZATION  04/06/13   [facility]-  minor luminal irregularities, otherwise normal cors; EF > 55%. Medical management and smoking cessation recommended   CATARACT EXTRACTION W/PHACO Right 08/28/2020   Procedure: CATARACT EXTRACTION PHACO AND INTRAOCULAR LENS PLACEMENT (IOC) RIGHT [employer];  Surgeon: Annell Kidney, MD;  Location: [facility];  Service: Ophthalmology;  Laterality: Right;  1.77 0:31.9 5.6%   CYSTOSCOPY/URETEROSCOPY/HOLMIUM LASER/STENT PLACEMENT Left 06/13/2020   Procedure:  CYSTOSCOPY/URETEROSCOPY//STENT PLACEMENT;  Surgeon: Dustin Gimenez, MD;  Location: [facility];  Service: Urology;  Laterality: Left;   CYSTOSCOPY/URETEROSCOPY/HOLMIUM LASER/STENT PLACEMENT Left 06/24/2020   Procedure: CYSTOSCOPY/URETEROSCOPY/HOLMIUM LASER/STENT PLACEMENT;  Surgeon: Dustin Gimenez, MD;  Location: [facility];  Service: Urology;  Laterality: Left;   KNEE ARTHROSCOPY W/ PARTIAL MEDIAL MENISCECTOMY Right 2012   [place], 2012   KNEE ARTHROSCOPY WITH MEDIAL MENISECTOMY Left 11/13/2020   Procedure: Left knee arthroscopy with partial medial or lateral menisectomy, possible chondroplasty, possible partial synovectomy;  Surgeon: Jerlyn Moons, MD;  Location: [facility];  Service: Orthopedics;  Laterality: Left;   LEFT HEART CATH AND CORONARY ANGIOGRAPHY N/A 03/20/2019   Procedure: LEFT HEART CATH AND CORONARY ANGIOGRAPHY;  Surgeon: Millicent Ally, MD;  Location: [facility];  Service: Cardiovascular;  Laterality: N/A;   NASAL SINUS SURGERY     neck fusion  2000   TONSILLECTOMY     VASECTOMY      Current Medications: Current Meds  Medication Sig   albuterol  (VENTOLIN  HFA) 108 (90 Base) MCG/ACT inhaler Inhale 2 puffs into the lungs every 6 (six) hours as needed for wheezing or shortness of breath.   amLODipine  (NORVASC ) 2.5 MG tablet TAKE 1 TABLET BY MOUTH EVERY DAY   aspirin  EC 81 MG tablet Take 81 mg by mouth daily.   atorvastatin  (LIPITOR) 80 MG tablet TAKE 1 TABLET BY MOUTH EVERY DAY   blood glucose meter kit and supplies KIT Dispense based on patient and insurance preference. Use up to two times daily as directed.   diclofenac  (VOLTAREN ) 75 MG EC tablet TAKE 1 TABLET(75 MG) BY MOUTH TWICE DAILY   escitalopram  (LEXAPRO ) 20 MG tablet TAKE 1 TABLET BY MOUTH EVERY DAY   ezetimibe  (ZETIA ) 10 MG tablet Take 1 tablet (10 mg total) by mouth daily.   fludrocortisone  (FLORINEF ) 0.1 MG tablet TAKE 2 TABLETS (200 MCG TOTAL) BY MOUTH DAILY.   fluticasone -salmeterol (ADVAIR) 250-50 MCG/ACT  AEPB Inhale 1 puff into the lungs in the morning and at bedtime.   ibuprofen (ADVIL) 200 MG tablet Take 200-800 mg by mouth every 6 (six) hours as needed for moderate pain.   ipratropium-albuterol  (DUONEB) 0.5-2.5 (3) MG/3ML SOLN Take 3 mLs by nebulization every 6 (six) hours as needed.   isosorbide  mononitrate (IMDUR ) 30 MG 24 hr tablet TAKE 1 TABLET BY MOUTH EVERY DAY   Multiple Vitamin (MULTIVITAMIN WITH MINERALS) TABS tablet Take 1 tablet by mouth daily.   nitroGLYCERIN  (NITROSTAT ) 0.4 MG SL tablet DISSOLVE 1 TABLET UNDER THE TONGUE EVERY 5 MINUTES AS NEEDED FOR CHEST PAIN   Omega-3 Fatty Acids (FISH OIL) 1200 MG CAPS Take 1,200 mg by mouth daily.   tiotropium (SPIRIVA  HANDIHALER) 18 MCG inhalation capsule Place 1 capsule (18 mcg total) into inhaler and inhale daily.   tiZANidine  (ZANAFLEX ) 4 MG tablet Take 1 tablet (4 mg total) by mouth 2 (two) times daily as needed for muscle spasms. Primarily at night, since daytime use will make drowsy    Allergies:   Other   Social History   Socioeconomic History   Marital status: Married    Spouse  name: MITZI   Number of children: Not on file   Years of education: Not on file   Highest education level: Not on file  Occupational History   Occupation: [employer]  Tobacco Use   Smoking status: Every Day    Current packs/day: 1.00    Average packs/day: 1 pack/day for 40.0 years (40.0 ttl pk-yrs)    Types: Cigarettes   Smokeless tobacco: Never   Tobacco comments:    1/2 ppd  Vaping Use   Vaping status: Never Used  Substance and Sexual Activity   Alcohol use: No    Alcohol/week: 0.0 standard drinks of alcohol   Drug use: No   Sexual activity: Yes    Partners: Female  Other Topics Concern   Not on file  Social History Narrative   Son of Marylou Sobers Santibanez([employer])      Likes to work out       Lives with GF   Social Drivers of Corporate investment banker Strain: Not on file  Food Insecurity: Not on file  Transportation Needs: Not  on file  Physical Activity: Not on file  Stress: Not on file  Social Connections: Not on file     Family History:  The patient's family history includes Arthritis in his father; CVA (age of onset: 48) in his father; Colon cancer in his mother; Coronary artery disease in his brother and father; Heart attack in his brother and brother; Hypertension in his father. There is no history of Esophageal cancer, Stomach cancer, or Rectal cancer.  ROS:   12-point review of systems is negative unless otherwise noted in the HPI.   EKGs/Labs/Other Studies Reviewed:    Studies reviewed were summarized above. The additional studies were reviewed today:  Lexiscan  MPI 01/19/2020: Pharmacological myocardial perfusion imaging study with no significant  ischemia Normal wall motion, EF estimated at 65% No EKG changes concerning for ischemia at peak stress or in recovery. CT attenuation correction images with mild coronary calcification in the LAD, no significant aortic atherosclerosis Low risk scan __________  2D echo 09/17/2019:  1. Left ventricular ejection fraction, by visual estimation, is 60 to  65%. The left ventricle has normal function. There is mildly increased  left ventricular hypertrophy.   2. Left ventricular diastolic parameters are consistent with Grade I  diastolic dysfunction (impaired relaxation).   3. The left ventricle has no regional wall motion abnormalities.   4. Global right ventricle has normal systolic function.The right  ventricular size is normal. Mildly increased right ventricular wall  thickness.   5. Left atrial size was normal.   6. Right atrial size was normal.   7. The mitral valve is grossly normal. Trivial mitral valve  regurgitation.   8. The tricuspid valve is grossly normal.   9. The aortic valve is normal in structure. Aortic valve regurgitation is  not visualized. Mild aortic valve sclerosis without stenosis.  10. The pulmonic valve was grossly normal.  Pulmonic valve regurgitation is  not visualized.  11. The inferior vena cava is dilated in size with >50% respiratory  variability, suggesting right atrial pressure of 8 mmHg.  __________  LHC 03/20/2019: Prox RCA lesion is 30% stenosed. Mid RCA lesion is 70% stenosed. 1st Mrg lesion is 40% stenosed. LV end diastolic pressure is low.   Mild to moderate CAD with a normal LAD, 40% smooth stenosis of the OM1 branch of the left circumflex coronary artery, and a dominant RCA with 30% mid narrowing, and in one  view up to a 70% stenosis proximal to the acute margin.  Following IC nitroglycerin  administration, the 70% stenosis improved to 40%.   LVEDP 4 mmHg   RECOMMENDATION: I suspect the patient's chest pain may be contributed by coronary vasospasm particularly exacerbated by his up to 2 pack/day cigarette smoking habit.  Smoking cessation is essential.  Since his stenosis was improved with IC nitroglycerin , recommend an initial medical therapy approach.  We will increase isosorbide  to 60 mg daily.  Consider addition of amlodipine .  Recommend high potency statin therapy in attempt to induce plaque regression.   __________  2D echo 03/20/2019: 1. The left ventricle has low normal systolic function, with an ejection  fraction of 50-55%. The cavity size was normal. Left ventricular diastolic  parameters were normal.   2. The right ventricle has normal systolic function. The cavity was  normal.   3. The mitral valve is grossly normal.   4. The tricuspid valve is grossly normal.   5. The aortic valve is tricuspid. No stenosis of the aortic valve.   6. The aortic root is normal in size and structure.   7. Low normal LV systolic function; trace MR and TR.  __________  Calcium  score 01/15/2016: IMPRESSION: Coronary calcium  score of 135. This was 72nd percentile for age and sex matched control. __________  48-hour Holter 11/2015: NSR with periods of sinus tachycardia Rare APCs and  PVCs __________  See CV studies in Epic for more remote imaging    EKG:  EKG is ordered today.  The EKG ordered today demonstrates NSR, 65 bpm, no acute st/t changes  Recent Labs: 10/06/2023: ALT 32; BUN 15; Creatinine, Ser 0.84; Hemoglobin 12.9; Platelets 223.0; Potassium 3.7; Sodium 145  Recent Lipid Panel    Component Value Date/Time   CHOL 92 10/06/2023 0937   CHOL 158 03/29/2013 0759   TRIG 52.0 10/06/2023 0937   TRIG 193 03/29/2013 0759   HDL 39.30 10/06/2023 0937   HDL 31 (L) 03/29/2013 0759   CHOLHDL 2 10/06/2023 0937   VLDL 10.4 10/06/2023 0937   VLDL 39 03/29/2013 0759   LDLCALC 42 10/06/2023 0937   LDLCALC 88 03/29/2013 0759   LDLDIRECT 102.5 (H) 02/07/2020 1634    PHYSICAL EXAM:    VS:  BP 112/74   Pulse 65   Ht 5' 4.5" (1.638 m)   Wt 124 lb (56.2 kg)   SpO2 95%   BMI 20.96 kg/m   BMI: Body mass index is 20.96 kg/m.  Physical Exam Vitals reviewed.  Constitutional:      Appearance: He is well-developed.  HENT:     Head: Normocephalic and atraumatic.  Eyes:     General:        Right eye: No discharge.        Left eye: No discharge.  Neck:     Vascular: No JVD.  Cardiovascular:     Rate and Rhythm: Normal rate and regular rhythm.     Pulses:          Posterior tibial pulses are 2+ on the right side and 2+ on the left side.     Heart sounds: Normal heart sounds, S1 normal and S2 normal. Heart sounds not distant. No midsystolic click and no opening snap. No murmur heard.    No friction rub.  Pulmonary:     Effort: Pulmonary effort is normal. No respiratory distress.     Breath sounds: Normal breath sounds. No decreased breath sounds, wheezing, rhonchi or rales.  Chest:     Chest wall: No tenderness.  Musculoskeletal:     Cervical back: Normal range of motion.     Right lower leg: No edema.     Left lower leg: No edema.  Skin:    General: Skin is warm and dry.     Nails: There is no clubbing.  Neurological:     Mental Status: He is alert and  oriented to person, place, and time.  Psychiatric:        Speech: Speech normal.        Behavior: Behavior normal.        Thought Content: Thought content normal.        Judgment: Judgment normal.     Wt Readings from Last 3 Encounters:  12/28/23 124 lb (56.2 kg)  10/06/23 126 lb 8 oz (57.4 kg)  12/14/22 123 lb 8 oz (56 kg)     ASSESSMENT & PLAN:   CAD involving the native coronary arteries with coronary vasospasm: He is doing very well and without symptoms concerning for recurrence of vasospasm, angina, or cardiac decompensation.  Continue aggressive risk factor modification including complete smoking cessation as well as some aspirin  81 mg, amlodipine  2.5 mg, Imdur  30 mg, and as needed SL NTG.  No indication for further ischemic testing at this time.  Orthostatic hypotension: Stable on fludrocortisone .  Maintain adequate hydration.  Slow positional changes.  HLD: LDL 42 in 10/2023 with normal AST/ALT at that time.  He remains on atorvastatin  80 mg.  COPD with tobacco use: No acute exacerbation.  He has tapered down tobacco use with complete cessation recommended.    Disposition: F/u with Dr. Gollan or an APP in 12 months, sooner if needed.   Medication Adjustments/Labs and Tests Ordered: Current medicines are reviewed at length with the patient today.  Concerns regarding medicines are outlined above. Medication changes, Labs and Tests ordered today are summarized above and listed in the Patient Instructions accessible in Encounters.   Signed, Varney Gentleman, PA-C 12/28/2023 4:49 PM     [facility] [place] [address] [place], [place] [address] [phone]

## 2023-12-28 ENCOUNTER — Encounter: Payer: Self-pay | Admitting: Physician Assistant

## 2023-12-28 ENCOUNTER — Ambulatory Visit: Attending: Physician Assistant | Admitting: Physician Assistant

## 2023-12-28 VITALS — BP 112/74 | HR 65 | Ht 64.5 in | Wt 124.0 lb

## 2023-12-28 DIAGNOSIS — I2510 Atherosclerotic heart disease of native coronary artery without angina pectoris: Secondary | ICD-10-CM | POA: Diagnosis not present

## 2023-12-28 DIAGNOSIS — E785 Hyperlipidemia, unspecified: Secondary | ICD-10-CM

## 2023-12-28 DIAGNOSIS — J449 Chronic obstructive pulmonary disease, unspecified: Secondary | ICD-10-CM

## 2023-12-28 DIAGNOSIS — Z720 Tobacco use: Secondary | ICD-10-CM

## 2023-12-28 DIAGNOSIS — I951 Orthostatic hypotension: Secondary | ICD-10-CM | POA: Diagnosis not present

## 2023-12-28 NOTE — Patient Instructions (Signed)
 Medication Instructions:  Your Physician recommend you continue on your current medication as directed.    *If you need a refill on your cardiac medications before your next appointment, please call your pharmacy*  Lab Work: No labs ordered today  If you have labs (blood work) drawn today and your tests are completely normal, you will receive your results only by: MyChart Message (if you have MyChart) OR A paper copy in the mail If you have any lab test that is abnormal or we need to change your treatment, we will call you to review the results.  Testing/Procedures: No test ordered today   Follow-Up: At [facility], you and your health needs are our priority.  As part of our continuing mission to provide you with exceptional heart care, our providers are all part of one team.  This team includes your primary Cardiologist (physician) and Advanced Practice Providers or APPs (Physician Assistants and Nurse Practitioners) who all work together to provide you with the care you need, when you need it.  Your next appointment:   12 month(s)  Provider:   Timothy Gollan, MD or Varney Gentleman, PA-C

## 2023-12-30 ENCOUNTER — Encounter: Payer: Self-pay | Admitting: Acute Care

## 2024-01-02 ENCOUNTER — Other Ambulatory Visit: Payer: Self-pay | Admitting: Family Medicine

## 2024-01-02 DIAGNOSIS — E785 Hyperlipidemia, unspecified: Secondary | ICD-10-CM

## 2024-01-22 DIAGNOSIS — F411 Generalized anxiety disorder: Secondary | ICD-10-CM | POA: Diagnosis not present

## 2024-01-22 DIAGNOSIS — E785 Hyperlipidemia, unspecified: Secondary | ICD-10-CM | POA: Diagnosis not present

## 2024-01-22 DIAGNOSIS — Z809 Family history of malignant neoplasm, unspecified: Secondary | ICD-10-CM | POA: Diagnosis not present

## 2024-01-22 DIAGNOSIS — Z7982 Long term (current) use of aspirin: Secondary | ICD-10-CM | POA: Diagnosis not present

## 2024-01-22 DIAGNOSIS — Z823 Family history of stroke: Secondary | ICD-10-CM | POA: Diagnosis not present

## 2024-01-22 DIAGNOSIS — Z791 Long term (current) use of non-steroidal anti-inflammatories (NSAID): Secondary | ICD-10-CM | POA: Diagnosis not present

## 2024-01-22 DIAGNOSIS — Z8249 Family history of ischemic heart disease and other diseases of the circulatory system: Secondary | ICD-10-CM | POA: Diagnosis not present

## 2024-01-22 DIAGNOSIS — I252 Old myocardial infarction: Secondary | ICD-10-CM | POA: Diagnosis not present

## 2024-01-22 DIAGNOSIS — I25119 Atherosclerotic heart disease of native coronary artery with unspecified angina pectoris: Secondary | ICD-10-CM | POA: Diagnosis not present

## 2024-01-22 DIAGNOSIS — F325 Major depressive disorder, single episode, in full remission: Secondary | ICD-10-CM | POA: Diagnosis not present

## 2024-01-22 DIAGNOSIS — I10 Essential (primary) hypertension: Secondary | ICD-10-CM | POA: Diagnosis not present

## 2024-01-22 DIAGNOSIS — I951 Orthostatic hypotension: Secondary | ICD-10-CM | POA: Diagnosis not present

## 2024-02-29 ENCOUNTER — Other Ambulatory Visit: Payer: Self-pay | Admitting: Family Medicine

## 2024-02-29 NOTE — Telephone Encounter (Signed)
 Last office visit 10/06/2023 for CPE.  Last refilled 10/15/2023 for #60 with 3 refills.  Next Appt: No future appointments.

## 2024-04-15 ENCOUNTER — Other Ambulatory Visit: Payer: Self-pay

## 2024-04-15 ENCOUNTER — Observation Stay
Admission: EM | Admit: 2024-04-15 | Discharge: 2024-04-16 | Disposition: A | Discharge status: Home / Self Care | Attending: Obstetrics and Gynecology | Admitting: Obstetrics and Gynecology

## 2024-04-15 ENCOUNTER — Emergency Department

## 2024-04-15 ENCOUNTER — Other Ambulatory Visit: Payer: Self-pay | Admitting: Family Medicine

## 2024-04-15 DIAGNOSIS — I10 Essential (primary) hypertension: Secondary | ICD-10-CM | POA: Insufficient documentation

## 2024-04-15 DIAGNOSIS — Z79899 Other long term (current) drug therapy: Secondary | ICD-10-CM | POA: Diagnosis not present

## 2024-04-15 DIAGNOSIS — J9601 Acute respiratory failure with hypoxia: Secondary | ICD-10-CM | POA: Diagnosis not present

## 2024-04-15 DIAGNOSIS — J9801 Acute bronchospasm: Secondary | ICD-10-CM | POA: Insufficient documentation

## 2024-04-15 DIAGNOSIS — Z720 Tobacco use: Secondary | ICD-10-CM | POA: Diagnosis present

## 2024-04-15 DIAGNOSIS — F319 Bipolar disorder, unspecified: Secondary | ICD-10-CM | POA: Insufficient documentation

## 2024-04-15 DIAGNOSIS — I951 Orthostatic hypotension: Secondary | ICD-10-CM | POA: Diagnosis not present

## 2024-04-15 DIAGNOSIS — J439 Emphysema, unspecified: Secondary | ICD-10-CM | POA: Diagnosis not present

## 2024-04-15 DIAGNOSIS — E119 Type 2 diabetes mellitus without complications: Secondary | ICD-10-CM | POA: Diagnosis not present

## 2024-04-15 DIAGNOSIS — I5032 Chronic diastolic (congestive) heart failure: Secondary | ICD-10-CM | POA: Insufficient documentation

## 2024-04-15 DIAGNOSIS — Z7901 Long term (current) use of anticoagulants: Secondary | ICD-10-CM | POA: Diagnosis not present

## 2024-04-15 DIAGNOSIS — I110 Hypertensive heart disease with heart failure: Secondary | ICD-10-CM | POA: Insufficient documentation

## 2024-04-15 DIAGNOSIS — Z1152 Encounter for screening for COVID-19: Secondary | ICD-10-CM | POA: Diagnosis not present

## 2024-04-15 DIAGNOSIS — R0789 Other chest pain: Secondary | ICD-10-CM | POA: Diagnosis not present

## 2024-04-15 DIAGNOSIS — I679 Cerebrovascular disease, unspecified: Secondary | ICD-10-CM | POA: Diagnosis present

## 2024-04-15 DIAGNOSIS — I2510 Atherosclerotic heart disease of native coronary artery without angina pectoris: Secondary | ICD-10-CM | POA: Diagnosis not present

## 2024-04-15 DIAGNOSIS — J168 Pneumonia due to other specified infectious organisms: Secondary | ICD-10-CM | POA: Diagnosis not present

## 2024-04-15 DIAGNOSIS — J189 Pneumonia, unspecified organism: Secondary | ICD-10-CM | POA: Diagnosis not present

## 2024-04-15 DIAGNOSIS — R918 Other nonspecific abnormal finding of lung field: Secondary | ICD-10-CM | POA: Diagnosis not present

## 2024-04-15 DIAGNOSIS — R079 Chest pain, unspecified: Secondary | ICD-10-CM | POA: Diagnosis present

## 2024-04-15 DIAGNOSIS — F17210 Nicotine dependence, cigarettes, uncomplicated: Secondary | ICD-10-CM | POA: Diagnosis not present

## 2024-04-15 DIAGNOSIS — R071 Chest pain on breathing: Secondary | ICD-10-CM | POA: Diagnosis not present

## 2024-04-15 DIAGNOSIS — J441 Chronic obstructive pulmonary disease with (acute) exacerbation: Secondary | ICD-10-CM | POA: Diagnosis not present

## 2024-04-15 LAB — PROCALCITONIN: Procalcitonin: <0.1 ng/mL

## 2024-04-15 LAB — CBC
HCT: 36.7 % — ABNORMAL LOW (ref 39.0–52.0)
Hemoglobin: 12.7 g/dL — ABNORMAL LOW (ref 13.0–17.0)
MCH: 32.3 pg (ref 26.0–34.0)
MCHC: 34.6 g/dL (ref 30.0–36.0)
MCV: 93.4 fL (ref 80.0–100.0)
Platelets: 236 K/uL (ref 150–400)
RBC: 3.93 MIL/uL — ABNORMAL LOW (ref 4.22–5.81)
RDW: 17.7 % — ABNORMAL HIGH (ref 11.5–15.5)
WBC: 17.3 K/uL — ABNORMAL HIGH (ref 4.0–10.5)
nRBC: 0 % (ref 0.0–0.2)

## 2024-04-15 LAB — BASIC METABOLIC PANEL WITH GFR
Anion gap: 10 (ref 5–15)
BUN: 26 mg/dL — ABNORMAL HIGH (ref 8–23)
CO2: 30 mmol/L (ref 22–32)
Calcium: 9.5 mg/dL (ref 8.9–10.3)
Chloride: 100 mmol/L (ref 98–111)
Creatinine, Ser: 0.96 mg/dL (ref 0.61–1.24)
GFR, Estimated: >60 mL/min (ref >60)
Glucose, Bld: 220 mg/dL — ABNORMAL HIGH (ref 70–99)
Potassium: 4 mmol/L (ref 3.5–5.1)
Sodium: 140 mmol/L (ref 135–145)

## 2024-04-15 LAB — RESP PANEL BY RT-PCR (RSV, FLU A&B, COVID)  RVPGX2
Influenza A by PCR: NEGATIVE
Influenza B by PCR: NEGATIVE
Resp Syncytial Virus by PCR: NEGATIVE
SARS Coronavirus 2 by RT PCR: NEGATIVE

## 2024-04-15 LAB — TROPONIN I (HIGH SENSITIVITY)
Troponin I (High Sensitivity): 6 ng/L (ref <18)
Troponin I (High Sensitivity): 4 ng/L (ref <18)

## 2024-04-15 LAB — BRAIN NATRIURETIC PEPTIDE: B Natriuretic Peptide: 74.7 pg/mL (ref 0.0–100.0)

## 2024-04-15 MED ORDER — ATORVASTATIN CALCIUM 80 MG PO TABS: 80.0000 mg | ORAL_TABLET | Freq: Every evening | ORAL | Status: DC

## 2024-04-15 MED ORDER — ASPIRIN 81 MG PO TBEC
81.0000 mg | DELAYED_RELEASE_TABLET | Freq: Every day | ORAL | Status: DC
  Administered 2024-04-16: 81 mg via ORAL
  Filled 2024-04-15: qty 1

## 2024-04-15 MED ORDER — INSULIN ASPART 100 UNIT/ML IJ SOLN
0.0000 [IU] | Freq: Three times a day (TID) | INTRAMUSCULAR | Status: DC
  Administered 2024-04-16: 2 [IU] via SUBCUTANEOUS
  Filled 2024-04-15: qty 1

## 2024-04-15 MED ORDER — ENOXAPARIN SODIUM 40 MG/0.4ML IJ SOSY: 40.0000 mg | PREFILLED_SYRINGE | INTRAMUSCULAR | Status: DC

## 2024-04-15 MED ORDER — SODIUM CHLORIDE 0.9 % IV SOLN
1.0000 g | Freq: Once | INTRAVENOUS | Status: AC
  Administered 2024-04-16: 1 g via INTRAVENOUS
  Filled 2024-04-15: qty 10

## 2024-04-15 MED ORDER — SODIUM CHLORIDE 0.9 % IV SOLN: 2.0000 g | INTRAVENOUS | Status: DC

## 2024-04-15 MED ORDER — AMLODIPINE BESYLATE 5 MG PO TABS
2.5000 mg | ORAL_TABLET | Freq: Every day | ORAL | Status: DC
  Filled 2024-04-15: qty 1

## 2024-04-15 MED ORDER — INSULIN ASPART 100 UNIT/ML IJ SOLN: 0.0000 [IU] | Freq: Every day | INTRAMUSCULAR | Status: DC

## 2024-04-15 MED ORDER — SODIUM CHLORIDE 0.9 % IV SOLN
500.0000 mg | INTRAVENOUS | Status: DC
  Administered 2024-04-16: 500 mg via INTRAVENOUS
  Filled 2024-04-15: qty 5

## 2024-04-15 MED ORDER — ESCITALOPRAM OXALATE 10 MG PO TABS: 20.0000 mg | ORAL_TABLET | Freq: Every evening | ORAL | Status: DC

## 2024-04-15 MED ORDER — IPRATROPIUM-ALBUTEROL 0.5-2.5 (3) MG/3ML IN SOLN
3.0000 mL | Freq: Four times a day (QID) | RESPIRATORY_TRACT | Status: DC
  Administered 2024-04-16 (×2): 3 mL via RESPIRATORY_TRACT
  Filled 2024-04-15 (×2): qty 3

## 2024-04-15 MED ORDER — ALBUTEROL SULFATE (2.5 MG/3ML) 0.083% IN NEBU: 2.5000 mg | INHALATION_SOLUTION | RESPIRATORY_TRACT | Status: DC | PRN

## 2024-04-15 MED ORDER — FLUDROCORTISONE ACETATE 0.1 MG PO TABS
200.0000 ug | ORAL_TABLET | Freq: Every evening | ORAL | Status: DC
  Filled 2024-04-15: qty 2

## 2024-04-15 MED ORDER — SODIUM CHLORIDE 0.9 % IV SOLN
1.0000 g | Freq: Once | INTRAVENOUS | Status: AC
  Administered 2024-04-15: 1 g via INTRAVENOUS
  Filled 2024-04-15: qty 10

## 2024-04-15 MED ORDER — PREDNISONE 20 MG PO TABS
60.0000 mg | ORAL_TABLET | Freq: Once | ORAL | Status: AC
  Administered 2024-04-15: 60 mg via ORAL
  Filled 2024-04-15: qty 3

## 2024-04-15 MED ORDER — EZETIMIBE 10 MG PO TABS
10.0000 mg | ORAL_TABLET | Freq: Every evening | ORAL | Status: DC
  Filled 2024-04-15: qty 1

## 2024-04-15 MED ORDER — IPRATROPIUM-ALBUTEROL 0.5-2.5 (3) MG/3ML IN SOLN
3.0000 mL | Freq: Once | RESPIRATORY_TRACT | Status: AC
  Administered 2024-04-15: 3 mL via RESPIRATORY_TRACT
  Filled 2024-04-15: qty 3

## 2024-04-15 MED ORDER — UMECLIDINIUM-VILANTEROL 62.5-25 MCG/ACT IN AEPB
1.0000 | INHALATION_SPRAY | Freq: Every day | RESPIRATORY_TRACT | Status: DC
  Administered 2024-04-16: 1 via RESPIRATORY_TRACT
  Filled 2024-04-15: qty 14

## 2024-04-15 MED ORDER — ISOSORBIDE MONONITRATE ER 30 MG PO TB24
30.0000 mg | ORAL_TABLET | Freq: Every day | ORAL | Status: DC
  Administered 2024-04-16: 30 mg via ORAL
  Filled 2024-04-15: qty 1

## 2024-04-15 MED ORDER — NICOTINE 7 MG/24HR TD PT24
7.0000 mg | MEDICATED_PATCH | Freq: Every day | TRANSDERMAL | Status: DC
  Filled 2024-04-15: qty 1

## 2024-04-15 MED ORDER — DOXYCYCLINE HYCLATE 100 MG PO TABS
100.0000 mg | ORAL_TABLET | Freq: Once | ORAL | Status: AC
  Administered 2024-04-15: 100 mg via ORAL
  Filled 2024-04-15: qty 1

## 2024-04-15 MED ORDER — PREDNISONE 20 MG PO TABS
40.0000 mg | ORAL_TABLET | Freq: Every day | ORAL | Status: DC
  Administered 2024-04-16: 40 mg via ORAL
  Filled 2024-04-15: qty 2

## 2024-04-15 NOTE — ED Notes (Signed)
 Lab called to add on BNP and procalcitonin.

## 2024-04-15 NOTE — ED Notes (Signed)
 EKG was completed and given to Dr. Nicholaus. EKG export was not successful. Copies printed

## 2024-04-15 NOTE — ED Triage Notes (Addendum)
 Mid sternal chest pain that started this AM. Pain radiates to left jaw. reports SOB, hx of COPD, does not wear oxygen at home. Report she has had a cough and sob for almost a week that is worsening, hx of heart attack

## 2024-04-15 NOTE — ED Provider Notes (Signed)
 [facility] Provider Note    Event Date/Time   First MD Initiated Contact with Patient 04/15/24 2032     (approximate)   History   Chest Pain   HPI  Isaiah Rangel is a 63 y.o. male  with pmh CAD with NSTEMI in the setting of coronary vasospasm in 03/2019, chronic chest pain, diastolic dysfunction, orthostatic hypotension, HLD, bipolar disorder, COPD, ongoing tobacco use, and GERD who presents with 3 days of progressively worsening shortness of breath cough and intermittent fevers and 1 day of chest pain.  Patient states that he was on the beach recently and developed cough and shortness of breath.  He has been taking an old prescription of 5 mg of prednisone  for the past several days.  This morning while walking he developed shortness of breath and cough and subsequently chest pain that radiated to his right jaw.  He last gave himself a nebulizing treatment this morning.  He does continue to smoke.  His daughter is at bedside and contributes to the history      Physical Exam   Triage Vital Signs: ED Triage Vitals  Encounter Vitals Group     BP 04/15/24 2018 106/66     Girls Systolic BP Percentile --      Girls Diastolic BP Percentile --      Boys Systolic BP Percentile --      Boys Diastolic BP Percentile --      Pulse Rate 04/15/24 2018 96     Resp 04/15/24 2018 (!) 22     Temp 04/15/24 2018 98.4 F (36.9 C)     Temp Source 04/15/24 2018 Oral     SpO2 04/15/24 2018 93 %     Weight 04/15/24 2019 123 lb (55.8 kg)     Height 04/15/24 2019 5' 4.5 (1.638 m)     Head Circumference --      Peak Flow --      Pain Score 04/15/24 2019 8     Pain Loc --      Pain Education --      Exclude from Growth Chart --     Most recent vital signs: Vitals:   04/15/24 2230 04/15/24 2300  BP: 112/83 123/86  Pulse: 94 (!) 101  Resp: (!) 23 20  Temp:    SpO2: 96% 94%    Nursing Triage Note reviewed. Vital signs reviewed and patients oxygen saturation is  normoxic  General: Patient is well nourished, well developed, awake and alert, resting comfortably in no acute distress Head: Normocephalic and atraumatic Eyes: Normal inspection, extraocular muscles intact, no conjunctival pallor Ear, nose, throat: Normal external exam Neck: Normal range of motion Respiratory: Patient is in no respiratory distress, lungs with rhonchi and wheezes Cardiovascular: Patient is borderline tachycardic, RR without murmur appreciated GI: Abd SNT with no guarding or rebound  Back: Normal inspection of the back with good strength and range of motion throughout all ext Extremities: pulses intact with good cap refills, no LE pitting edema or calf tenderness Neuro: The patient is alert and oriented to person, place, and time, appropriately conversive, with 5/5 bilat UE/LE strength, no gross motor or sensory defects noted. Coordination appears to be adequate. Skin: Warm, dry, and intact Psych: normal mood and affect, no SI or HI  ED Results / Procedures / Treatments   Labs (all labs ordered are listed, but only abnormal results are displayed) Labs Reviewed  BASIC METABOLIC PANEL WITH GFR - Abnormal; Notable for  the following components:      Result Value   Glucose, Bld 220 (*)    BUN 26 (*)    All other components within normal limits  CBC - Abnormal; Notable for the following components:   WBC 17.3 (*)    RBC 3.93 (*)    Hemoglobin 12.7 (*)    HCT 36.7 (*)    RDW 17.7 (*)    All other components within normal limits  RESP PANEL BY RT-PCR (RSV, FLU A&B, COVID)  RVPGX2  EXPECTORATED SPUTUM ASSESSMENT W GRAM STAIN, RFLX TO RESP C  BRAIN NATRIURETIC PEPTIDE  PROCALCITONIN  HIV ANTIBODY (ROUTINE TESTING W REFLEX)  CBC  CREATININE, SERUM  D-DIMER, QUANTITATIVE  LEGIONELLA PNEUMOPHILA SEROGP 1 UR AG  MYCOPLASMA PNEUMONIAE ANTIBODY, IGM  HEMOGLOBIN A1C  TROPONIN I (HIGH SENSITIVITY)  TROPONIN I (HIGH SENSITIVITY)     EKG EKG and rhythm strip are  interpreted by myself:   EKG: [tachycardic sinus rhythm] at heart rate of 103, normal QRS duration, QTc 455, nonspecific ST segments and T waves no ectopy EKG not consistent with Acute STEMI Rhythm strip: NSR in lead II   RADIOLOGY Xray chest: Opacities consistent with pneumonia and radiologist agrees    PROCEDURES:  Critical Care performed: No  Procedures   MEDICATIONS ORDERED IN ED: Medications  enoxaparin  (LOVENOX ) injection 40 mg (has no administration in time range)  predniSONE  (DELTASONE ) tablet 40 mg (has no administration in time range)  albuterol  (PROVENTIL ) (2.5 MG/3ML) 0.083% nebulizer solution 2.5 mg (has no administration in time range)  umeclidinium-vilanterol (ANORO ELLIPTA ) 62.5-25 MCG/ACT 1 puff (has no administration in time range)  insulin  aspart (novoLOG ) injection 0-9 Units (has no administration in time range)  insulin  aspart (novoLOG ) injection 0-5 Units (has no administration in time range)  nicotine  (NICODERM CQ  - dosed in mg/24 hr) patch 7 mg (has no administration in time range)  cefTRIAXone  (ROCEPHIN ) 2 g in sodium chloride  0.9 % 100 mL IVPB (has no administration in time range)  azithromycin  (ZITHROMAX ) 500 mg in sodium chloride  0.9 % 250 mL IVPB (has no administration in time range)  aspirin  EC tablet 81 mg (has no administration in time range)  amLODipine  (NORVASC ) tablet 2.5 mg (has no administration in time range)  atorvastatin  (LIPITOR) tablet 80 mg (has no administration in time range)  ezetimibe  (ZETIA ) tablet 10 mg (has no administration in time range)  isosorbide  mononitrate (IMDUR ) 24 hr tablet 30 mg (has no administration in time range)  escitalopram  (LEXAPRO ) tablet 20 mg (has no administration in time range)  fludrocortisone  (FLORINEF ) tablet 200 mcg (has no administration in time range)  ipratropium-albuterol  (DUONEB) 0.5-2.5 (3) MG/3ML nebulizer solution 3 mL (has no administration in time range)  cefTRIAXone  (ROCEPHIN ) 1 g in sodium  chloride 0.9 % 100 mL IVPB (has no administration in time range)  ipratropium-albuterol  (DUONEB) 0.5-2.5 (3) MG/3ML nebulizer solution 3 mL (3 mLs Nebulization Given 04/15/24 2051)  doxycycline  (VIBRA -TABS) tablet 100 mg (100 mg Oral Given 04/15/24 2144)  cefTRIAXone  (ROCEPHIN ) 1 g in sodium chloride  0.9 % 100 mL IVPB (0 g Intravenous Stopped 04/15/24 2223)  predniSONE  (DELTASONE ) tablet 60 mg (60 mg Oral Given 04/15/24 2231)  ipratropium-albuterol  (DUONEB) 0.5-2.5 (3) MG/3ML nebulizer solution 3 mL (3 mLs Nebulization Given 04/15/24 2231)     IMPRESSION / MDM / ASSESSMENT AND PLAN / ED COURSE  Differential diagnosis includes, but is not limited to, COPD/bronchospasm, CHF, ACS, pneumonia, bronchitis, upper respiratory infection   ED course: Patient arrives and pulmonary exam is consistent with bronchospasm.  He was given a DuoNeb and had relief of his symptoms and impaired improved.  Chest x-ray consistent with pneumonia and will treat with doxycycline  and ceftriaxone  for community-acquired pneumonia.  EKG without evidence of acute ischemia and first troponin not elevated.  Repeat troponin is pending at this time   Clinical Course as of 04/15/24 2359  Sat Apr 15, 2024  2043 WBC(!): 17.3 Has been taking steroids so unclear whether this is secondary to a pneumonia [HD]  2112 DG Chest 2 View Suspicious for pneumonia will start antibiotics [HD]  2144 Troponin I (High Sensitivity): 6 Troponin is not elevated [HD]  2218 Patient unfortunately dropped his oxygen level.  Will plan for admission.  I have ordered him another DuoNeb and he is placed on oxygen [HD]    Clinical Course User Index [HD] Nicholaus Rolland BRAVO, MD     FINAL CLINICAL IMPRESSION(S) / ED DIAGNOSES   Final diagnoses:  Pneumonia due to infectious organism, unspecified laterality, unspecified part of lung  Bronchospasm     Rx / DC Orders   ED Discharge Orders     None        Note:   This document was prepared using Dragon voice recognition software and may include unintentional dictation errors.   Nicholaus Rolland BRAVO, MD 04/15/24 [phone]

## 2024-04-16 DIAGNOSIS — I10 Essential (primary) hypertension: Secondary | ICD-10-CM | POA: Insufficient documentation

## 2024-04-16 DIAGNOSIS — J189 Pneumonia, unspecified organism: Secondary | ICD-10-CM

## 2024-04-16 DIAGNOSIS — J9601 Acute respiratory failure with hypoxia: Secondary | ICD-10-CM

## 2024-04-16 DIAGNOSIS — F319 Bipolar disorder, unspecified: Secondary | ICD-10-CM | POA: Insufficient documentation

## 2024-04-16 DIAGNOSIS — I5032 Chronic diastolic (congestive) heart failure: Secondary | ICD-10-CM | POA: Insufficient documentation

## 2024-04-16 LAB — CBC
HCT: 35.1 % — ABNORMAL LOW (ref 39.0–52.0)
HCT: 34.6 % — ABNORMAL LOW (ref 39.0–52.0)
Hemoglobin: 12.1 g/dL — ABNORMAL LOW (ref 13.0–17.0)
Hemoglobin: 11.6 g/dL — ABNORMAL LOW (ref 13.0–17.0)
MCH: 32.2 pg (ref 26.0–34.0)
MCH: 31.7 pg (ref 26.0–34.0)
MCHC: 34.5 g/dL (ref 30.0–36.0)
MCHC: 33.5 g/dL (ref 30.0–36.0)
MCV: 93.4 fL (ref 80.0–100.0)
MCV: 94.5 fL (ref 80.0–100.0)
Platelets: 223 K/uL (ref 150–400)
Platelets: 229 K/uL (ref 150–400)
RBC: 3.76 MIL/uL — ABNORMAL LOW (ref 4.22–5.81)
RBC: 3.66 MIL/uL — ABNORMAL LOW (ref 4.22–5.81)
RDW: 17.7 % — ABNORMAL HIGH (ref 11.5–15.5)
RDW: 17.7 % — ABNORMAL HIGH (ref 11.5–15.5)
WBC: 16.1 K/uL — ABNORMAL HIGH (ref 4.0–10.5)
WBC: 15.7 K/uL — ABNORMAL HIGH (ref 4.0–10.5)
nRBC: 0 % (ref 0.0–0.2)
nRBC: 0 % (ref 0.0–0.2)

## 2024-04-16 LAB — COMPREHENSIVE METABOLIC PANEL WITH GFR
ALT: 27 U/L (ref 0–44)
AST: 22 U/L (ref 15–41)
Albumin: 3.1 g/dL — ABNORMAL LOW (ref 3.5–5.0)
Alkaline Phosphatase: 73 U/L (ref 38–126)
Anion gap: 8 (ref 5–15)
BUN: 22 mg/dL (ref 8–23)
CO2: 28 mmol/L (ref 22–32)
Calcium: 8.4 mg/dL — ABNORMAL LOW (ref 8.9–10.3)
Chloride: 105 mmol/L (ref 98–111)
Creatinine, Ser: 0.78 mg/dL (ref 0.61–1.24)
GFR, Estimated: >60 mL/min (ref >60)
Glucose, Bld: 199 mg/dL — ABNORMAL HIGH (ref 70–99)
Potassium: 3.6 mmol/L (ref 3.5–5.1)
Sodium: 141 mmol/L (ref 135–145)
Total Bilirubin: 0.7 mg/dL (ref 0.0–1.2)
Total Protein: 6.3 g/dL — ABNORMAL LOW (ref 6.5–8.1)

## 2024-04-16 LAB — D-DIMER, QUANTITATIVE: D-Dimer, Quant: 0.55 ug{FEU}/mL — ABNORMAL HIGH (ref 0.00–0.50)

## 2024-04-16 LAB — GLUCOSE, CAPILLARY: Glucose-Capillary: 164 mg/dL — ABNORMAL HIGH (ref 70–99)

## 2024-04-16 LAB — CREATININE, SERUM
Creatinine, Ser: 0.97 mg/dL (ref 0.61–1.24)
GFR, Estimated: >60 mL/min (ref >60)

## 2024-04-16 LAB — HIV ANTIBODY (ROUTINE TESTING W REFLEX): HIV Screen 4th Generation wRfx: NONREACTIVE

## 2024-04-16 MED ORDER — POLYETHYLENE GLYCOL 3350 17 G PO PACK: 17.0000 g | PACK | Freq: Every day | ORAL | Status: DC | PRN

## 2024-04-16 MED ORDER — AMOXICILLIN-POT CLAVULANATE 875-125 MG PO TABS: 1.0000 | ORAL_TABLET | Freq: Two times a day (BID) | ORAL | 0 refills | Status: AC

## 2024-04-16 MED ORDER — AZITHROMYCIN 250 MG PO TABS: 500.0000 mg | ORAL_TABLET | Freq: Every day | ORAL | 0 refills | Status: AC

## 2024-04-16 MED ORDER — OXYCODONE HCL 5 MG PO TABS: 5.0000 mg | ORAL_TABLET | ORAL | Status: DC | PRN

## 2024-04-16 MED ORDER — PREDNISONE 10 MG PO TABS: ORAL_TABLET | ORAL | 0 refills | Status: DC

## 2024-04-16 MED ORDER — ACETAMINOPHEN 650 MG RE SUPP: 650.0000 mg | Freq: Four times a day (QID) | RECTAL | Status: DC | PRN

## 2024-04-16 MED ORDER — ACETAMINOPHEN 325 MG PO TABS
650.0000 mg | ORAL_TABLET | Freq: Four times a day (QID) | ORAL | Status: DC | PRN
Start: 2024-04-16 — End: 2024-04-16
  Filled 2024-04-16: qty 2

## 2024-04-16 NOTE — Assessment & Plan Note (Signed)
  Tobacco usage Nicotine  patch

## 2024-04-16 NOTE — Plan of Care (Signed)

## 2024-04-16 NOTE — Assessment & Plan Note (Signed)
 Chronic diastolic heart failure Last echo with grade 1 diastolic dysfunction Do not believe contributing to the patient's current presentation brain atretic peptide within normal limits

## 2024-04-16 NOTE — Assessment & Plan Note (Signed)
  Orthostatic hypotension Continue the patient's home fludrocortisone  200 mcg

## 2024-04-16 NOTE — Assessment & Plan Note (Signed)
 Pneumonia COVID and flu negative Patient has right middle lobe opacity on x-ray presentation consistent with community-acquired pneumonia we will treat with ceftriaxone  and azithromycin  interestingly procalcitonin was negative patient has a leukocytosis of 17.3 (possible steroid induced demargination contributing) we will check mycoplasma Legionella and sputum culture if he can provide a sample

## 2024-04-16 NOTE — Assessment & Plan Note (Signed)
  Hypertension Continue the patient's amlodipine  2.5 mg

## 2024-04-16 NOTE — Assessment & Plan Note (Signed)
 Acute hypoxic respiratory failure Patient dyspneic saturating 85% on room air, suspect this is primarily a COPD exacerbation as well as a pneumonia D-dimer resulted at 0.55 which is lower than the patient's age-adjusted D-dimer cutoff of 0.64 making PE unlikely Plan: Continuous pulse oximetry, titrate supplemental oxygen to keep SpO2 greater than 89%

## 2024-04-16 NOTE — Evaluation (Signed)
 Physical Therapy Evaluation Patient Details Name: Isaiah Rangel MRN: [id number] DOB: Apr 03, 1961 Today's Date: 04/16/2024  History of Present Illness  63 y/o male patient with hospitalization d/t worsening SOB over the past 3days. MD dx includes: acute respiratory failure, COPD exacerbation, community acquired pneumonia, CHF, CAD, and orthostatic hypotension.  Clinical Impression  Pt is a pleasant 63 year old male who was admitted for c/c of worsening SOB 2/2 COPD exacerbation. Pt performs bed mobility, transfers, and ambulation with independence. Pt demonstrates deficits with decreased endurance 2/2 COPD exacerbation. PT assessment of SpO2 on RA while ambulating and SpO2 stayed WNL throughout PT session. Pt left on RA. Pt demonstrates all bed mobility/transfers/ambulation at baseline level. Pt does not require any further PT needs at this time. Pt will be dc in house and does not require follow up. RN aware. Will dc current orders.      If plan is discharge home, recommend the following: A little help with walking and/or transfers;Assistance with cooking/housework   Can travel by private vehicle        Equipment Recommendations None recommended by PT  Recommendations for Other Services       Functional Status Assessment Patient has not had a recent decline in their functional status     Precautions / Restrictions Precautions Precautions: Fall Recall of Precautions/Restrictions: Intact Restrictions Weight Bearing Restrictions Per Provider Order: No      Mobility  Bed Mobility Overal bed mobility: Independent             General bed mobility comments: Pt able to move to EOB with independence and did not demonstrate any difficulty with the task.    Transfers Overall transfer level: Independent Equipment used: None               General transfer comment: Pt demonstrates ease with STS from EOB. Used BUE to push up.    Ambulation/Gait Ambulation/Gait assistance:  Independent Gait Distance (Feet): 400 Feet Assistive device: IV Pole, None Gait Pattern/deviations: WFL(Within Functional Limits), Step-through pattern       General Gait Details: Pt began walking with IV pole, however pt discontinued use of IV pole and ambulated throughout the rest of PT session without AD.  Stairs            Wheelchair Mobility     Tilt Bed    Modified Rankin (Stroke Patients Only)       Balance Overall balance assessment: No apparent balance deficits (not formally assessed)                                           Pertinent Vitals/Pain Pain Assessment Pain Assessment: No/denies pain    Home Living Family/patient expects to be discharged to:: Private residence Living Arrangements: Spouse/significant other Available Help at Discharge: Family Type of Home: House Home Access: Stairs to enter Entrance Stairs-Rails: Left;Right;Can reach both Secretary/administrator of Steps: 5   Home Layout: Able to live on main level with bedroom/bathroom Home Equipment: Rollator (4 wheels);Crutches;Shower seat Additional Comments: Pt reports also having a knee scooter. Wife available to help, if needed.    Prior Function Prior Level of Function : Independent/Modified Independent             Mobility Comments: Pt did not require AD for ambulation at baseline function. Pt also did not require suppolemental oxygen. ADLs Comments: Independent for all ADLs.  Extremity/Trunk Assessment   Upper Extremity Assessment Upper Extremity Assessment: Overall WFL for tasks assessed    Lower Extremity Assessment Lower Extremity Assessment: Overall WFL for tasks assessed    Cervical / Trunk Assessment Cervical / Trunk Assessment: Normal  Communication   Communication Communication: No apparent difficulties    Cognition Arousal: Alert Behavior During Therapy: WFL for tasks assessed/performed   PT - Cognitive impairments: No apparent  impairments                       PT - Cognition Comments: Pt is aler, pleasant, and agreeable to PT. Following commands: Intact       Cueing Cueing Techniques: Verbal cues     General Comments      Exercises Other Exercises Other Exercises: O2 saturation test. SpO2 stayed WNL throughout PT session with pt on RA. Other Exercises: Education about if feeling SOB, calling nurse to place O2 back on.   Assessment/Plan    PT Assessment Patient does not need any further PT services  PT Problem List         PT Treatment Interventions      PT Goals (Current goals can be found in the Care Plan section)  Acute Rehab PT Goals Patient Stated Goal: To go home PT Goal Formulation: With patient Time For Goal Achievement: 04/30/24 Potential to Achieve Goals: Good    Frequency       Co-evaluation               AM-PAC PT 6 Clicks Mobility  Outcome Measure Help needed turning from your back to your side while in a flat bed without using bedrails?: None Help needed moving from lying on your back to sitting on the side of a flat bed without using bedrails?: None Help needed moving to and from a bed to a chair (including a wheelchair)?: None Help needed standing up from a chair using your arms (e.g., wheelchair or bedside chair)?: None Help needed to walk in hospital room?: None Help needed climbing 3-5 steps with a railing? : None 6 Click Score: 24    End of Session Equipment Utilized During Treatment: Gait belt Activity Tolerance: Patient tolerated treatment well Patient left: in chair;with call bell/phone within reach;with nursing/sitter in room Nurse Communication: Mobility status PT Visit Diagnosis: Other (comment) (decreased endurance secondary to COPD exacerbation)    Time: 9243-9179 PT Time Calculation (min) (ACUTE ONLY): 24 min   Charges:                 Thaddius Manes, SPT   Annalucia Laino 04/16/2024, 8:39 AM

## 2024-04-16 NOTE — Discharge Summary (Signed)
 Isaiah Rangel FMW:[id number] DOB: 1960-09-20 DOA: 04/15/2024  PCP: [name], MD  Admit date: 04/15/2024 Discharge date: 04/16/2024  Time spent: 35 minutes  Recommendations for Outpatient Follow-up:  Pcp f/u next week     Discharge Diagnoses:  Active Problems:   Acute hypoxic respiratory failure (HCC)   COPD exacerbation (HCC)   CAP (community acquired pneumonia)   Cerebrovascular disease (but no CVA)   Tobacco use   CAD (coronary artery disease)   Orthostatic hypotension   Diabetes mellitus type 2, diet-controlled (HCC)   Chronic diastolic CHF (congestive heart failure) (HCC)   HTN (hypertension)   Bipolar disorder (HCC)   Discharge Condition: much improved  Diet recommendation: heart healthy  Filed Weights   04/15/24 2019  Weight: 55.8 kg    History of present illness:  From admission h and p Isaiah Rangel is a 63 year old gentleman with past medical history of 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  He does report malaise and subjective but not objective fever increasing sputum production.  The patient reports he was recently at the beach and reports taking some prednisone  he had leftover from previous COPD exacerbation.   Case was discussed with the emergency department physician and patient subsequently admitted. Past medical records reviewed and summarized: Patient's office visit from 12/28/2023 with the patient's cardiologist: Chronic dyspnea attributed to COPD, coronary artery disease involving the native coronary arteries with coronary vasospasm no indications for further ischemic testing at that time  Hospital Course:  Patient presents with several days cough and dyspnea. Found to have RML opacity on CXR.  Admitted as hypoxic to mid-80s in the ER. Treated for CAP/COPD with steroids and antibiotics. By hospital day 1 patient reports he feels 100% better. Has ambulated without difficulty/dyspnea. Cough essentially resolved. Lungs clear. Hypoxia resolved. Will discharge to complete a course of augmentin /azithromycin  and prednisone . Advise close pcp f/u.   Procedures: none   Consultations: none  Discharge Exam: Vitals:   04/16/24 0726 04/16/24 0750  BP:  110/65  Pulse:  67  Resp:  19  Temp:  (!) 97.5 F (36.4 C)  SpO2: 98% 98%    General: NAD Cardiovascular: RRR Respiratory: CTAB  Discharge Instructions   Discharge Instructions     Diet - low sodium heart healthy   Complete by: As directed    Increase activity slowly   Complete by: As directed       Allergies as of 04/16/2024       Reactions   Other Nausea And Vomiting, Other (See Comments)   Quail eggs caused severe stomach pain and n/v        Medication List     STOP taking these medications    methylPREDNISolone  4 MG tablet Commonly known as: MEDROL        TAKE these medications    albuterol  108 (90 Base) MCG/ACT inhaler Commonly known as: VENTOLIN  HFA Inhale 2 puffs into the lungs every 6 (six) hours as needed for wheezing or shortness of breath.   amLODipine  2.5 MG tablet Commonly known as: NORVASC  TAKE 1 TABLET BY MOUTH EVERY DAY   amoxicillin -clavulanate 875-125 MG tablet Commonly known as: AUGMENTIN  Take 1 tablet by mouth 2 (two) times daily for 4 days.   aspirin  EC 81 MG tablet Take 81 mg by mouth daily.   atorvastatin  80  MG tablet Commonly known as: LIPITOR TAKE 1 TABLET BY MOUTH EVERY DAY   azithromycin  250 MG tablet Commonly known as: ZITHROMAX  Take 2 tablets (500 mg total) by mouth daily for 2 days.   blood glucose meter kit and supplies Kit Dispense based on patient and insurance preference. Use up to two times daily as directed.   diclofenac  75 MG EC tablet Commonly known as:  VOLTAREN  TAKE 1 TABLET(75 MG) BY MOUTH TWICE DAILY   escitalopram  20 MG tablet Commonly known as: LEXAPRO  TAKE 1 TABLET BY MOUTH EVERY DAY   ezetimibe  10 MG tablet Commonly known as: ZETIA  TAKE 1 TABLET BY MOUTH EVERY DAY   Fish Oil 1200 MG Caps Take 2,400 mg by mouth daily.   fludrocortisone  0.1 MG tablet Commonly known as: FLORINEF  TAKE 2 TABLETS (200 MCG TOTAL) BY MOUTH DAILY.   ibuprofen 200 MG tablet Commonly known as: ADVIL Take 200-800 mg by mouth every 6 (six) hours as needed for moderate pain.   ipratropium-albuterol  0.5-2.5 (3) MG/3ML Soln Commonly known as: DUONEB Take 3 mLs by nebulization every 6 (six) hours as needed.   isosorbide  mononitrate 30 MG 24 hr tablet Commonly known as: IMDUR  TAKE 1 TABLET BY MOUTH EVERY DAY   Mucinex D Max Strength [phone] MG Tb12 Generic drug: Pseudoephedrine-Guaifenesin Take 1 tablet by mouth every evening.   multivitamin with minerals Tabs tablet Take 1 tablet by mouth daily.   nitroGLYCERIN  0.4 MG SL tablet Commonly known as: NITROSTAT  DISSOLVE 1 TABLET UNDER THE TONGUE EVERY 5 MINUTES AS NEEDED FOR CHEST PAIN   predniSONE  10 MG tablet Commonly known as: DELTASONE  4 tabs daily for 3 days then 3 tabs daily for 2 days then 2 tabs daily for 2 days then 1 tab daily for 2 days   tiotropium 18 MCG inhalation capsule Commonly known as: Spiriva  HandiHaler Place 1 capsule (18 mcg total) into inhaler and inhale daily.   tiZANidine  4 MG tablet Commonly known as: ZANAFLEX  Take 1 tablet (4 mg total) by mouth 2 (two) times daily as needed for muscle spasms. Primarily at night, since daytime use will make drowsy       Allergies  Allergen Reactions   Other Nausea And Vomiting and Other (See Comments)    Quail eggs caused severe stomach pain and n/v    Follow-up Information     [name], Jacques, MD Follow up.   Specialties: Family Medicine, Sports Medicine Why: next week Contact information: [address]  [place] [address] [phone]                  The results of significant diagnostics from this hospitalization (including imaging, microbiology, ancillary and laboratory) are listed below for reference.    Significant Diagnostic Studies: DG Chest 2 View Result Date: 04/15/2024 CLINICAL DATA:  Chest pain short of breath EXAM: CHEST - 2 VIEW COMPARISON:  12/08/2022, CT 03/17/2022, 11/09/2022 FINDINGS: Hyperinflated lungs with emphysema. Probable scarring at left base. Increased right middle lobe opacity suspicious for atelectasis or pneumonia. Normal cardiac size. No pneumothorax IMPRESSION: 1. Increased right middle lobe opacity suspicious for atelectasis or pneumonia. 2. Emphysema. Electronically Signed   By: Luke Bun M.D.   On: 04/15/2024 20:55    Microbiology: Recent Results (from the past 240 hours)  Resp panel by RT-PCR (RSV, Flu A&B, Covid) Anterior Nasal Swab     Status: None   Collection Time: 04/15/24  8:50 PM   Specimen: Anterior Nasal Swab  Result Value Ref Range  Status   SARS Coronavirus 2 by RT PCR NEGATIVE NEGATIVE Final    Comment: (NOTE) SARS-CoV-2 target nucleic acids are NOT DETECTED.  The SARS-CoV-2 RNA is generally detectable in upper respiratory specimens during the acute phase of infection. The lowest concentration of SARS-CoV-2 viral copies this assay can detect is 138 copies/mL. A negative result does not preclude SARS-Cov-2 infection and should not be used as the sole basis for treatment or other patient management decisions. A negative result may occur with  improper specimen collection/handling, submission of specimen other than nasopharyngeal swab, presence of viral mutation(s) within the areas targeted by this assay, and inadequate number of viral copies(<138 copies/mL). A negative result must be combined with clinical observations, patient history, and epidemiological information. The expected result is Negative.  Fact Sheet for  Patients:  BloggerCourse.com  Fact Sheet for Healthcare Providers:  SeriousBroker.it  This test is no t yet approved or cleared by the [place]  FDA and  has been authorized for detection and/or diagnosis of SARS-CoV-2 by FDA under an Emergency Use Authorization (EUA). This EUA will remain  in effect (meaning this test can be used) for the duration of the COVID-19 declaration under Section 564(b)(1) of the Act, 21 U.S.C.section 360bbb-3(b)(1), unless the authorization is terminated  or revoked sooner.       Influenza A by PCR NEGATIVE NEGATIVE Final   Influenza B by PCR NEGATIVE NEGATIVE Final    Comment: (NOTE) The Xpert Xpress SARS-CoV-2/FLU/RSV plus assay is intended as an aid in the diagnosis of influenza from Nasopharyngeal swab specimens and should not be used as a sole basis for treatment. Nasal washings and aspirates are unacceptable for Xpert Xpress SARS-CoV-2/FLU/RSV testing.  Fact Sheet for Patients: BloggerCourse.com  Fact Sheet for Healthcare Providers: SeriousBroker.it  This test is not yet approved or cleared by the [place]  FDA and has been authorized for detection and/or diagnosis of SARS-CoV-2 by FDA under an Emergency Use Authorization (EUA). This EUA will remain in effect (meaning this test can be used) for the duration of the COVID-19 declaration under Section 564(b)(1) of the Act, 21 U.S.C. section 360bbb-3(b)(1), unless the authorization is terminated or revoked.     Resp Syncytial Virus by PCR NEGATIVE NEGATIVE Final    Comment: (NOTE) Fact Sheet for Patients: BloggerCourse.com  Fact Sheet for Healthcare Providers: SeriousBroker.it  This test is not yet approved or cleared by the [place]  FDA and has been authorized for detection and/or diagnosis of SARS-CoV-2 by FDA under an Emergency  Use Authorization (EUA). This EUA will remain in effect (meaning this test can be used) for the duration of the COVID-19 declaration under Section 564(b)(1) of the Act, 21 U.S.C. section 360bbb-3(b)(1), unless the authorization is terminated or revoked.  Performed at [facility], [address] [place], [place] [address]      Labs: Basic Metabolic Panel: Recent Labs  Lab 04/15/24 2023 04/16/24 0044 04/16/24 0424  NA 140  --  141  K 4.0  --  3.6  CL 100  --  105  CO2 30  --  28  GLUCOSE 220*  --  199*  BUN 26*  --  22  CREATININE 0.96 0.97 0.78  CALCIUM  9.5  --  8.4*   Liver Function Tests: Recent Labs  Lab 04/16/24 0424  AST 22  ALT 27  ALKPHOS 73  BILITOT 0.7  PROT 6.3*  ALBUMIN 3.1*   No results for input(s): LIPASE, AMYLASE in the last 168 hours. No results for  input(s): AMMONIA in the last 168 hours. CBC: Recent Labs  Lab 04/15/24 2023 04/16/24 0044 04/16/24 0424  WBC 17.3* 16.1* 15.7*  HGB 12.7* 12.1* 11.6*  HCT 36.7* 35.1* 34.6*  MCV 93.4 93.4 94.5  PLT 236 223 229   Cardiac Enzymes: No results for input(s): CKTOTAL, CKMB, CKMBINDEX, TROPONINI in the last 168 hours. BNP: BNP (last 3 results) Recent Labs    04/15/24 2023  BNP 74.7    ProBNP (last 3 results) No results for input(s): PROBNP in the last 8760 hours.  CBG: Recent Labs  Lab 04/16/24 0749  GLUCAP 164*       Signed:  Devaughn KATHEE Ban MD.  [facility] 04/16/2024, 9:45 AM

## 2024-04-16 NOTE — Progress Notes (Signed)
 OT Cancellation Note  Patient Details Name: Isaiah Rangel MRN: [id number] DOB: 05-Sep-1960   Cancelled Treatment:    Reason Eval/Treat Not Completed: OT screened, no needs identified, will sign off. Per PT, pt is at his baseline with no skilled OT services warranted. Will sign off in house.  Isaiah Rangel E Isaiah Rangel 04/16/2024, 9:27 AM

## 2024-04-16 NOTE — Assessment & Plan Note (Signed)
  Bipolar disorder Patient currently euthymic Continue Lexapro  20 mg daily

## 2024-04-16 NOTE — Assessment & Plan Note (Signed)
   Coronary artery disease by left heart cath Current CP Free Patient has past episodes of chest pain thought to be secondary to vasospasm, patient denies any current chest pain had reported some chest pain when he was coughing troponins are negative continue his aspirin  atorvastatin  80 mg Zetia  10 mg Imdur  30 Do not suspect a primary cardiac cause of the pain although do not have a visible ECG ordering ECG

## 2024-04-16 NOTE — H&P (Addendum)
 History and Physical    Patient: Isaiah Rangel FMW:[id number] DOB: 1961/01/17 DOA: 04/15/2024 DOS: the patient was seen and examined on 04/15/2024 PCP: [name], MD  Patient coming from: Home  Chief Complaint:  Chief Complaint  Patient presents with   Chest Pain   HPI:   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  He does report malaise and subjective but not objective fever increasing sputum production.  The patient reports he was recently at the beach and reports taking some prednisone  he had leftover from previous COPD exacerbation.  Case was discussed with the emergency department physician and patient subsequently admitted. Past medical records reviewed and summarized: Patient's office visit from 12/28/2023 with the patient's cardiologist: Chronic dyspnea attributed to COPD, coronary artery disease involving the native coronary arteries with coronary vasospasm no indications for further ischemic testing at that time  Review of Systems:  Constitutional: Denies Weight Loss, Fever, Chills or Night Sweats Eyes: Denies Blurry Vision, Eye Pain or Decreased Vision ENT: Denies Sore Throat , Tinnitus, Hearing Loss Respiratory: Reports  Shortness of Breath, Cough No  Hemoptysis, Wheezing, Pleurisy Cardiovascular:  Chest Pain but no Paroxsymal Nocturnal Dyspnea, Palpitations, Edema All other systems were reviewed and are negative   Past Medical History:  Diagnosis Date   Basal cell carcinoma    BPH (benign prostatic hyperplasia)    Cerebrovascular disease (but no CVA) 04/29/2015   COPD (chronic obstructive pulmonary disease) (HCC)    Coronary artery  disease/Coronary Vasospasm    a. 03/2013 Cath: min irregs. EF >55%; b. 03/2019 Cath: LM nl, LAD nl, LCX nl, OM1 40, RCA 30p, 56m-->40% after IC NTG; c. 12/2019 MV: EF 65%, no ischemia/infarct-->low risk.   Diabetes mellitus type 2, diet-controlled (HCC) 02/05/2021   Diastolic dysfunction    a. 03/2019 Echo: EF 50-55%, nl RV size/fxn. Trace MR/TR; b. 09/2019 Echo: EF 60-65%, mild LVH, GrI DD, no rwma, triv MR, mild AoV sclerosis w/o stenosis.   GAD (generalized anxiety disorder)    GERD (gastroesophageal reflux disease)    History of kidney stones    Hyperlipidemia    Myocardial infarction (HCC) 03/2019   NO STENTS   Squamous cell carcinoma of skin    Tobacco abuse    Past Surgical History:  Procedure Laterality Date   BACK SURGERY  1998   NO METAL   CARDIAC CATHETERIZATION  04/06/13   [facility]- minor luminal irregularities, otherwise normal cors; EF > 55%. Medical management and smoking cessation recommended   CATARACT EXTRACTION W/PHACO Right 08/28/2020   Procedure: CATARACT EXTRACTION PHACO AND INTRAOCULAR LENS PLACEMENT (IOC) RIGHT [name];  Surgeon: Mittie Gaskin, MD;  Location: [facility];  Service: Ophthalmology;  Laterality: Right;  1.77 0:31.9 5.6%   CYSTOSCOPY/URETEROSCOPY/HOLMIUM LASER/STENT PLACEMENT Left 06/13/2020   Procedure: CYSTOSCOPY/URETEROSCOPY//STENT PLACEMENT;  Surgeon: Penne Knee, MD;  Location: [facility];  Service: Urology;  Laterality: Left;   CYSTOSCOPY/URETEROSCOPY/HOLMIUM LASER/STENT PLACEMENT Left 06/24/2020   Procedure: CYSTOSCOPY/URETEROSCOPY/HOLMIUM LASER/STENT PLACEMENT;  Surgeon: Penne Knee, MD;  Location: [facility];  Service: Urology;  Laterality: Left;   KNEE ARTHROSCOPY W/ PARTIAL MEDIAL MENISCECTOMY Right 2012   [place], 2012   KNEE ARTHROSCOPY WITH MEDIAL MENISECTOMY Left 11/13/2020   Procedure: Left knee arthroscopy with partial medial or lateral menisectomy, possible chondroplasty,  possible partial synovectomy;  Surgeon: Leora Lynwood SAUNDERS, MD;  Location: [facility];  Service: Orthopedics;  Laterality: Left;   LEFT HEART CATH AND CORONARY ANGIOGRAPHY N/A 03/20/2019   Procedure: LEFT HEART CATH AND CORONARY ANGIOGRAPHY;  Surgeon: Burnard Debby LABOR, MD;  Location: [facility];  Service: Cardiovascular;  Laterality: N/A;   NASAL SINUS SURGERY     neck fusion  2000   TONSILLECTOMY     VASECTOMY     Social History:  reports that he has been smoking cigarettes. He has a 40 pack-year smoking history. He has never used smokeless tobacco. He reports that he does not drink alcohol and does not use drugs.  Allergies  Allergen Reactions   Other Nausea And Vomiting and Other (See Comments)    Quail eggs caused severe stomach pain and n/v    Family History  Problem Relation Age of Onset   Arthritis Father    Coronary artery disease Father    Hypertension Father    CVA Father 25   Colon cancer Mother    Heart attack Brother    Heart attack Brother    Coronary artery disease Brother    Esophageal cancer Neg Hx    Stomach cancer Neg Hx    Rectal cancer Neg Hx     Prior to Admission medications   Medication Sig Start Date End Date Taking? Authorizing Provider  albuterol  (VENTOLIN  HFA) 108 (90 Base) MCG/ACT inhaler Inhale 2 puffs into the lungs every 6 (six) hours as needed for wheezing or shortness of breath.   Yes [provider]  amLODipine  (NORVASC ) 2.5 MG tablet TAKE 1 TABLET BY MOUTH EVERY DAY 11/15/23  Yes [name], Spencer, MD  aspirin  EC 81 MG tablet Take 81 mg by mouth daily.   Yes [provider]  atorvastatin  (LIPITOR) 80 MG tablet TAKE 1 TABLET BY MOUTH EVERY DAY 11/15/23  Yes [name], Jacques, MD  diclofenac  (VOLTAREN ) 75 MG EC tablet TAKE 1 TABLET(75 MG) BY MOUTH TWICE DAILY 02/29/24  Yes [name], Jacques, MD  escitalopram  (LEXAPRO ) 20 MG tablet TAKE 1 TABLET BY MOUTH EVERY DAY 11/15/23  Yes [name], Jacques, MD  ezetimibe  (ZETIA ) 10 MG tablet TAKE 1 TABLET BY MOUTH EVERY DAY 01/03/24  Yes  [name], Jacques, MD  fludrocortisone  (FLORINEF ) 0.1 MG tablet TAKE 2 TABLETS (200 MCG TOTAL) BY MOUTH DAILY. 12/06/23  Yes [name], Jacques, MD  ibuprofen (ADVIL) 200 MG tablet Take 200-800 mg by mouth every 6 (six) hours as needed for moderate pain.   Yes [provider]  ipratropium-albuterol  (DUONEB) 0.5-2.5 (3) MG/3ML SOLN Take 3 mLs by nebulization every 6 (six) hours as needed. 10/29/22  Yes [name], Jacques, MD  isosorbide  mononitrate (IMDUR ) 30 MG 24 hr tablet TAKE 1 TABLET BY MOUTH EVERY DAY 11/15/23  Yes [name], Jacques, MD  methylPREDNISolone  (MEDROL ) 4 MG tablet Take 12 mg by mouth once.   Yes [provider]  Multiple Vitamin (MULTIVITAMIN WITH MINERALS) TABS tablet Take 1 tablet by mouth daily.   Yes [provider]  nitroGLYCERIN  (NITROSTAT ) 0.4 MG SL tablet DISSOLVE 1 TABLET UNDER THE TONGUE EVERY 5 MINUTES AS NEEDED FOR CHEST PAIN 06/06/19  Yes Gollan, Timothy J, MD  Omega-3 Fatty Acids (FISH OIL) 1200 MG CAPS Take 2,400 mg by mouth daily.   Yes [provider]  Pseudoephedrine-Guaifenesin (MUCINEX D MAX STRENGTH) [phone] MG TB12 Take 1 tablet by mouth every evening.   Yes [provider]  blood glucose meter kit and supplies KIT  Dispense based on patient and insurance preference. Use up to two times daily as directed. 06/11/21   [name], Jacques, MD  tiotropium (SPIRIVA  HANDIHALER) 18 MCG inhalation capsule Place 1 capsule (18 mcg total) into inhaler and inhale daily. Patient not taking: Reported on 04/15/2024 12/14/22   [name], Jacques, MD  tiZANidine  (ZANAFLEX ) 4 MG tablet Take 1 tablet (4 mg total) by mouth 2 (two) times daily as needed for muscle spasms. Primarily at night, since daytime use will make drowsy Patient not taking: Reported on 04/15/2024 08/27/22   Watt Jacques, MD    Physical Exam: Vitals:   04/15/24 2230 04/15/24 2300 04/16/24 0010 04/16/24 0130  BP: 112/83 123/86 134/82   Pulse: 94 (!) 101 98   Resp: (!) 23 20     Temp:   99.8 F (37.7 C)   TempSrc:      SpO2: 96% 94% 95% 95%  Weight:      Height:      Patient seen and examined at approximately 11 PM with the medication reconciliation tech Inocente and the patient's daughter at the bedside Constitutional:  Vital Signs as per [facility] than three noted] No Acute Distress Eyes:  Pink Conjunctiva and no Ptosis Neck:     Trachea Midline, Neck Symmetric             Thyroid  without tenderness, palpable masses or nodules Respiratory:   Respiratory Effort Normal: No Use of Respiratory Muscles,No  Intercostal Retractions on supplemental O2             Bilateral wheeze with crackles right greater than left Cardiovascular:   Heart Auscultated: Regular Regular without any added sounds or murmurs              No Lower Extremity Edema Gastrointestinal:  Abdomen soft and nontender without palpable masses, guarding or rebound  No Palpable Splenomegaly or Hepatomegaly Psychiatric:  Patient Orientated to Time, Place and Person Patient with appropriate mood and affect Recent and Remote Memory Intact   Data Reviewed: Labs, Radiology, ECG as detailed in HPI and A/P   Assessment and Plan: Acute hypoxic respiratory failure (HCC) Acute hypoxic respiratory failure Patient dyspneic saturating 85% on room air, suspect this is primarily a COPD exacerbation as well as a pneumonia D-dimer resulted at 0.55 which is lower than the patient's age-adjusted D-dimer cutoff of 0.64 making PE unlikely Plan: Continuous pulse oximetry, titrate supplemental oxygen to keep SpO2 greater than 89%   COPD exacerbation (HCC) Chronic obstructive pulmonary disorder exacerbation Patient has vasospasm increasing cardinal symptoms of COPD exacerbation with increasing cough and sputum and change in sputum consistency we will treat with prednisone  40 mg by mouth, scheduled LABA LAMA with scheduled DuoNebs for today followed by as needed albuterol  with a LABA and LAMA  CAP (community  acquired pneumonia) Pneumonia COVID and flu negative Patient has right middle lobe opacity on x-ray presentation consistent with community-acquired pneumonia we will treat with ceftriaxone  and azithromycin  interestingly procalcitonin was negative patient has a leukocytosis of 17.3 (possible steroid induced demargination contributing) ? Atypical we will check mycoplasma Legionella and sputum culture if he can provide a sample  Bipolar disorder (HCC)  Bipolar disorder Patient currently euthymic Continue Lexapro  20 mg daily  HTN (hypertension)  Hypertension Continue the patient's amlodipine  2.5 mg   Chronic diastolic CHF (congestive heart failure) (HCC) Chronic diastolic heart failure Last echo with grade 1 diastolic dysfunction Do not believe contributing to the patient's current presentation brain atretic peptide within normal limits  Orthostatic hypotension  Orthostatic hypotension Continue the patient's home fludrocortisone  200 mcg   CAD (coronary artery disease)   Coronary artery disease by left heart cath Current CP Free Patient has past episodes of chest pain thought to be secondary to vasospasm, patient denies any current chest pain had reported some chest pain when he was coughing troponins are negative continue his aspirin  atorvastatin  80 mg Zetia  10 mg Imdur  30 Do not suspect a primary cardiac cause of the pain although do not have a visible ECG ordering ECG    Tobacco use  Tobacco usage Nicotine  patch      Advance Care Planning:   Code Status: Full Code   Consults: N/A  Family Communication: Daughter at Bedside  Severity of Illness: The appropriate patient status for this patient is INPATIENT. Inpatient status is judged to be reasonable and necessary in order to provide the required intensity of service to ensure the patient's safety. The patient's presenting symptoms, physical exam findings, and initial radiographic and laboratory data in the context of  their chronic comorbidities is felt to place them at high risk for further clinical deterioration. Furthermore, it is not anticipated that the patient will be medically stable for discharge from the hospital within 2 midnights of admission.   * I certify that at the point of admission it is my clinical judgment that the patient will require inpatient hospital care spanning beyond 2 midnights from the point of admission due to high intensity of service, high risk for further deterioration and high frequency of surveillance required.*  Author: Prentice JAYSON Lowenstein, MD 04/16/2024 2:15 AM  For on call review [email or web address].

## 2024-04-16 NOTE — Assessment & Plan Note (Signed)
 Chronic obstructive pulmonary disorder exacerbation Patient has vasospasm increasing cardinal symptoms of COPD exacerbation with increasing cough and sputum and change in sputum consistency we will treat with prednisone  40 mg by mouth, scheduled LABA LAMA with scheduled DuoNebs for today followed by as needed albuterol  with a LABA and LAMA

## 2024-04-17 ENCOUNTER — Other Ambulatory Visit: Payer: Self-pay | Admitting: Family Medicine

## 2024-04-17 ENCOUNTER — Telehealth: Payer: Self-pay

## 2024-04-17 LAB — HEMOGLOBIN A1C
Hgb A1c MFr Bld: 6 % — ABNORMAL HIGH (ref 4.8–5.6)
Mean Plasma Glucose: 126 mg/dL

## 2024-04-17 MED ORDER — IPRATROPIUM-ALBUTEROL 0.5-2.5 (3) MG/3ML IN SOLN: 3.0000 mL | Freq: Four times a day (QID) | RESPIRATORY_TRACT | 1 refills | Status: AC | PRN

## 2024-04-17 MED ORDER — ALBUTEROL SULFATE HFA 108 (90 BASE) MCG/ACT IN AERS: 2.0000 | INHALATION_SPRAY | Freq: Four times a day (QID) | RESPIRATORY_TRACT | 5 refills | Status: AC | PRN

## 2024-04-17 NOTE — Transitions of Care (Post Inpatient/ED Visit) (Signed)
 04/17/2024  Name: Isaiah Rangel MRN: [id number] DOB: March 15, 1961  Today's TOC FU Call Status: Today's TOC FU Call Status:: Successful TOC FU Call Completed TOC FU Call Complete Date: 04/17/24 Patient's Name and Date of Birth confirmed.  Transition Care Management Follow-up Telephone Call Date of Discharge: 04/16/24 Discharge Facility: [facility]) Type of Discharge: Inpatient Admission Primary Inpatient Discharge Diagnosis:: Acute hypoxia respiratory failure/ COPD exacerbation/ CAP How have you been since you were released from the hospital?: Better (Patient states he feelsbetter. He reports having a cough. He states the cough keeps him up at times. Patient states the nebulizer seems to be helping.) Any questions or concerns?: Yes Patient Questions/Concerns:: Patient states his albuterol  inhaler was denied by his insurance. He states he will contact his insurance company to follow up on this. Patient Questions/Concerns Addressed: Other: (Discussed patients concern.  Advised patient to left his primary provider know if he is not able to obtain his albuterol  inhaler.)  Items Reviewed: Did you receive and understand the discharge instructions provided?: Yes Medications obtained,verified, and reconciled?: Yes (Medications Reviewed) Any new allergies since your discharge?: No Dietary orders reviewed?: Yes Type of Diet Ordered:: low sodium, heart healthy Do you have support at home?: Yes People in Home [RPT]: spouse Name of Support/Comfort Primary Source: [employer]  Medications Reviewed Today: Medications Reviewed Today     Reviewed by Kruz Chiu E, RN (Registered Nurse) on 04/17/24 at 1346  Med List Status: <None>   Medication Order Taking? Sig Documenting Provider Last Dose Status Informant  albuterol  (VENTOLIN  HFA) 108 (90 Base) MCG/ACT inhaler [id number]  Inhale 2 puffs into the lungs every 6 (six) hours as needed for wheezing or shortness of breath.   Patient not taking: Reported on 04/17/2024   [provider]  Active Self  amLODipine  (NORVASC ) 2.5 MG tablet [id number] Yes TAKE 1 TABLET BY MOUTH EVERY DAY [name], Spencer, MD  Active Self  amoxicillin -clavulanate (AUGMENTIN ) 875-125 MG tablet [id number] Yes Take 1 tablet by mouth 2 (two) times daily for 4 days. Kandis Devaughn Sayres, MD  Active   aspirin  EC 81 MG tablet [id number] Yes Take 81 mg by mouth daily. [provider]  Active Self  atorvastatin  (LIPITOR) 80 MG tablet [id number] Yes TAKE 1 TABLET BY MOUTH EVERY DAY [name], Spencer, MD  Active Self  azithromycin  (ZITHROMAX ) 250 MG tablet [id number] Yes Take 2 tablets (500 mg total) by mouth daily for 2 days. Wouk, Devaughn Sayres, MD  Active   blood glucose meter kit and supplies KIT [id number] Yes Dispense based on patient and insurance preference. Use up to two times daily as directed. [name], Jacques, MD  Active Self  diclofenac  (VOLTAREN ) 75 MG EC tablet [id number] Yes TAKE 1 TABLET(75 MG) BY MOUTH TWICE DAILY [name], Spencer, MD  Active Self  escitalopram  (LEXAPRO ) 20 MG tablet [id number] Yes TAKE 1 TABLET BY MOUTH EVERY DAY [name], Spencer, MD  Active Self  ezetimibe  (ZETIA ) 10 MG tablet [id number] Yes TAKE 1 TABLET BY MOUTH EVERY DAY [name], Spencer, MD  Active Self  fludrocortisone  (FLORINEF ) 0.1 MG tablet [id number] Yes TAKE 2 TABLETS (200 MCG TOTAL) BY MOUTH DAILY. Watt Jacques, MD  Active Self  ibuprofen (ADVIL) 200 MG tablet [id number]  Take 200-800 mg by mouth every 6 (six) hours as needed for moderate pain.  Patient not taking: Reported on 04/17/2024   [provider]  Active Self  ipratropium-albuterol  (DUONEB) 0.5-2.5 (3) MG/3ML SOLN [id number] Yes Take 3 mLs by nebulization every 6 (six)  hours as needed. [name], Jacques, MD  Active Self  isosorbide  mononitrate (IMDUR ) 30 MG 24 hr tablet [id number] Yes TAKE 1 TABLET BY MOUTH EVERY DAY [name], Spencer, MD  Active Self  Multiple Vitamin (MULTIVITAMIN WITH  MINERALS) TABS tablet [id number] Yes Take 1 tablet by mouth daily. [provider]  Active Self           Med Note BEVERLEE JUDGE LITTIE Pablo Dec 30, 2020  1:36 PM)    nitroGLYCERIN  (NITROSTAT ) 0.4 MG SL tablet [id number] Yes DISSOLVE 1 TABLET UNDER THE TONGUE EVERY 5 MINUTES AS NEEDED FOR CHEST PAIN Gollan, Timothy J, MD  Active Self  Omega-3 Fatty Acids (FISH OIL) 1200 MG CAPS [id number] Yes Take 2,400 mg by mouth daily. [provider]  Active Self  predniSONE  (DELTASONE ) 10 MG tablet [id number] Yes 4 tabs daily for 3 days then 3 tabs daily for 2 days then 2 tabs daily for 2 days then 1 tab daily for 2 days Wouk, Devaughn Sayres, MD  Active   Pseudoephedrine-Guaifenesin [facility] D MAX STRENGTH) [phone] MG TB12 [id number] Yes Take 1 tablet by mouth every evening. [provider]  Active Self  tiotropium (SPIRIVA  HANDIHALER) 18 MCG inhalation capsule [id number]  Place 1 capsule (18 mcg total) into inhaler and inhale daily.  Patient not taking: Reported on 04/15/2024   Watt Jacques, MD  Active Pharmacy Records, Self  tiZANidine  (ZANAFLEX ) 4 MG tablet [id number] Yes Take 1 tablet (4 mg total) by mouth 2 (two) times daily as needed for muscle spasms. Primarily at night, since daytime use will make drowsy [name], Spencer, MD  Active Pharmacy Records, Self            Home Care and Equipment/Supplies: Were Home Health Services Ordered?: No Any new equipment or medical supplies ordered?: No  Functional Questionnaire: Do you need assistance with bathing/showering or dressing?: No Do you need assistance with meal preparation?: No Do you need assistance with eating?: No Do you have difficulty maintaining continence: No Do you need assistance with getting out of bed/getting out of a chair/moving?: No Do you have difficulty managing or taking your medications?: No  Follow up appointments reviewed: PCP Follow-up appointment confirmed?: Yes (Assisted patient with arranging  hospital follow up visit with primary care provider.  Patient is aware that follow up appointment is scheduled for 04/24/24 at 11 am.) Date of PCP follow-up appointment?: 04/24/24 Follow-up Provider: Dr. Jacques Perfect Specialist [facility] Follow-up appointment confirmed?: NA Do you need transportation to your follow-up appointment?: No Do you understand care options if your condition(s) worsen?: Yes-patient verbalized understanding  SDOH Interventions Today    Flowsheet Row Most Recent Value  SDOH Interventions   Food Insecurity Interventions Intervention Not Indicated  Housing Interventions Intervention Not Indicated  Transportation Interventions Intervention Not Indicated  Utilities Interventions Intervention Not Indicated   Discussed and offered 30 day TOC program.  Patient  declined.  The patient has been provided with contact information for the care management team and has been advised to call with any health -related questions or concerns.  The patient verbalized understanding with current plan of care.  The patient is directed to their insurance card regarding availability of benefits coverage.    Arvin Seip RN, BSN, CCM [employer], Population Health Case Manager Phone: [phone]

## 2024-04-17 NOTE — Telephone Encounter (Signed)
 Copied from CRM #[id number]. Topic: Clinical - Medication Refill >> Apr 17, 2024  4:15 PM Delon DASEN wrote: Not seeing original provider Medication: albuterol  (VENTOLIN  HFA) 108 (90 Base) MCG/ACT inhaler ipratropium-albuterol  (DUONEB) 0.5-2.5 (3) MG/3ML SOLN  Has the patient contacted their pharmacy? Yes (Agent: If no, request that the patient contact the pharmacy for the refill. If patient does not wish to contact the pharmacy document the reason why and proceed with request.) (Agent: If yes, when and what did the pharmacy advise?)  This is the patient's preferred pharmacy:  [id number] [place], [place] - [address] [place] [address] Phone: [phone] Fax: [phone]   Is this the correct pharmacy for this prescription? Yes If no, delete pharmacy and type the correct one.   Has the prescription been filled recently? Yes  Is the patient out of the medication? Yes  Has the patient been seen for an appointment in the last year OR does the patient have an upcoming appointment? Yes  Can we respond through MyChart? Yes  Agent: Please be advised that Rx refills may take up to 3 business days. We ask that you follow-up with your pharmacy.

## 2024-04-17 NOTE — Patient Instructions (Signed)
 Visit Information  Thank you for taking time to visit with me today. Please don't hesitate to contact me if I can be of assistance to you   Patient instructions  Follow up with provider as recommended Take medications as prescribed.  Notify provider if your symptoms worsen or call 911 for severe breathing issues Get plenty of rest. Pace yourself Stay hydrated Cover your mouth when coughing/ sneezing Wash hands often with soap/ water   Patient verbalizes understanding of instructions and care plan provided today and agrees to view in MyChart. Active MyChart status and patient understanding of how to access instructions and care plan via MyChart confirmed with patient.     The patient has been provided with contact information for the care management team and has been advised to call with any health related questions or concerns.   Please call the care guide team at [phone] if you need to cancel or reschedule your appointment.   Please call the Suicide and Crisis Lifeline: 988 call 1-800-273-TALK (toll free, 24 hour hotline) if you are experiencing a Mental Health or Behavioral Health Crisis or need someone to talk to.  Arvin Seip RN, BSN, CCM [employer], Population Health Case Manager Phone: [phone]

## 2024-04-18 LAB — MYCOPLASMA PNEUMONIAE ANTIBODY, IGM: Mycoplasma pneumo IgM: <770 U/mL (ref 0–769)

## 2024-04-23 NOTE — Progress Notes (Signed)
 "    [name], MD, CAQ Sports Medicine [facility] at [facility] [address] [place], [address]  Phone: [phone]  FAX: [phone]  Isaiah Rangel - 63 y.o. male  MRN [id number]  Date of Birth: Mar 11, 1961  Date: 04/24/2024  PCP: [name], MD  Referral: [name], MD  Chief Complaint  Patient presents with   Hospitalization Follow-up   Subjective:   Isaiah Rangel is a 63 y.o. very pleasant male patient with Body mass index is 21.29 kg/m. who presents with the following:  Discussed the use of AI scribe software for clinical note transcription with the patient, who gave verbal consent to proceed.  Lyndy is a very nice patient who I have known for many years.  He presents today for hospital follow-up  Admit date: 04/15/2024 Discharge date: 04/16/2024  He was admitted with acute hypoxic respiratory failure and COPD exacerbation.  He presented initially to the emergency room with increasing shortness of breath over about 3 days.  He was found to have a right middle lobe pneumonia on chest x-ray and was hypoxic in the 80s in the ER.  He was treated with antibiotics and steroids for community-acquired pneumonia and COPD.  He was discharged on Augmentin , azithromycin , and prednisone .  Advair 250 History of Present Illness Isaiah Rangel is a 63 year old male with respiratory failure and pneumonia who presents with ongoing respiratory issues and smoking cessation challenges.  He has ongoing respiratory issues following a recent hospitalization for respiratory failure and pneumonia. The condition developed rapidly after returning from a trip to the beach, where he began feeling unwell. By Friday night, he was unable to catch his breath and was coughing severely. His partner noted low oxygen levels, prompting a hospital visit. He is currently using a Duoneb nebulizer three times a day, which helps when he is short of breath,  especially during physical exertion.  He has a history of smoking and is currently trying to cut back. He uses hard candy to reduce smoking and mentions that his throat feels scratchy. He has attempted to quit smoking multiple times but finds it challenging. He previously tried Chantix , which he discontinued due to adverse effects, and is open to trying other methods to quit smoking.  He has diabetes but does not take medication for it. During his recent hospital stay, he received insulin  shots, likely due to elevated blood sugar levels from steroid use. He is currently taking oral prednisone , which makes him feel jittery. He has completed a course of antibiotics, including a Z-Pak and Augmentin , following his hospital discharge.  He has a history of heart issues and experiences occasional chest pain. He has nitroglycerin  available but has not needed it recently.  His partner, Mitzi, has been dealing with her own health issues, including a broken foot and subsequent complications, which have impacted his daily life. He has been taking on additional responsibilities at home due to her limited mobility.    Review of Systems is noted in the HPI, as appropriate  Objective:   BP 126/68   Pulse 63   Temp 98.9 F (37.2 C) (Temporal)   Ht 5' 4.5 (1.638 m)   Wt 126 lb (57.2 kg)   SpO2 93%   BMI 21.29 kg/m   GEN: No acute distress; alert,appropriate. CV: RRR, no m/g/r  PULM: Normal respiratory rate, no accessory muscle use. No wheezes, crackles or rhonchi  PSYCH: Normally interactive.    Laboratory and  Imaging Data:  Assessment and Plan:     ICD-10-CM   1. Pneumonia of right middle lobe due to infectious organism  J18.9     2. Acute hypoxic respiratory failure (HCC)  J96.01     3. Coronary artery disease involving native coronary artery of native heart without angina pectoris  I25.10     4. COPD (chronic obstructive pulmonary disease) with chronic bronchitis (HCC)  J44.89       Assessment & Plan Chronic obstructive pulmonary disease (COPD) Recent respiratory failure with pneumonia, likely COPD exacerbation. Lungs clear, pneumonia resolved. Smoking increases lung disease risk. - Prescribe Advair inhaler. - Advise monitoring for respiratory distress and seek medical attention if symptoms worsen.  Tobacco use disorder Continues smoking despite cessation attempts. Chantix  not tolerated. Smoking cessation critical due to lung and heart disease. Wellbutrin  considered. - Prescribe Wellbutrin  for smoking cessation. - Discuss importance of quitting smoking to prevent lung function deterioration and heart disease progression.  Atherosclerotic heart disease of native coronary artery Diastolic dysfunction, recent chest pain. Smoking and COPD increase risk. - Encourage smoking cessation to reduce cardiovascular risk. - Monitor for chest pain, use nitroglycerin  as needed.  Type 2 diabetes mellitus Diet-controlled, no medication. Recent insulin  use due to steroid-induced hyperglycemia. - Continue monitoring blood glucose, especially on steroids.    Disposition: Return in about 6 months (around 10/25/2024) for Complete physical.  [employer] One speech-to-text software was used for transcription in this dictation.  Possible transcriptional errors can occur using Animal nutritionist.   Signed,  Jacques DASEN. Genevive Printup, MD   Outpatient Encounter Medications as of 04/24/2024  Medication Sig   albuterol  (VENTOLIN  HFA) 108 (90 Base) MCG/ACT inhaler Inhale 2 puffs into the lungs every 6 (six) hours as needed for wheezing or shortness of breath.   amLODipine  (NORVASC ) 2.5 MG tablet TAKE 1 TABLET BY MOUTH EVERY DAY   aspirin  EC 81 MG tablet Take 81 mg by mouth daily.   atorvastatin  (LIPITOR) 80 MG tablet TAKE 1 TABLET BY MOUTH EVERY DAY   blood glucose meter kit and supplies KIT Dispense based on patient and insurance preference. Use up to two times daily as directed.   buPROPion   (WELLBUTRIN  XL) 150 MG 24 hr tablet Take 1 tablet (150 mg total) by mouth daily.   diclofenac  (VOLTAREN ) 75 MG EC tablet TAKE 1 TABLET(75 MG) BY MOUTH TWICE DAILY   escitalopram  (LEXAPRO ) 20 MG tablet TAKE 1 TABLET BY MOUTH EVERY DAY   ezetimibe  (ZETIA ) 10 MG tablet TAKE 1 TABLET BY MOUTH EVERY DAY   fludrocortisone  (FLORINEF ) 0.1 MG tablet TAKE 2 TABLETS (200 MCG TOTAL) BY MOUTH DAILY.   fluticasone -salmeterol (ADVAIR) 250-50 MCG/ACT AEPB Inhale 1 puff into the lungs in the morning and at bedtime.   ibuprofen (ADVIL) 200 MG tablet Take 200-800 mg by mouth every 6 (six) hours as needed for moderate pain.   ipratropium-albuterol  (DUONEB) 0.5-2.5 (3) MG/3ML SOLN Take 3 mLs by nebulization every 6 (six) hours as needed.   isosorbide  mononitrate (IMDUR ) 30 MG 24 hr tablet TAKE 1 TABLET BY MOUTH EVERY DAY   Multiple Vitamin (MULTIVITAMIN WITH MINERALS) TABS tablet Take 1 tablet by mouth daily.   nitroGLYCERIN  (NITROSTAT ) 0.4 MG SL tablet DISSOLVE 1 TABLET UNDER THE TONGUE EVERY 5 MINUTES AS NEEDED FOR CHEST PAIN   Omega-3 Fatty Acids (FISH OIL) 1200 MG CAPS Take 2,400 mg by mouth daily.   predniSONE  (DELTASONE ) 10 MG tablet 4 tabs daily for 3 days then 3 tabs daily for 2 days  then 2 tabs daily for 2 days then 1 tab daily for 2 days   Pseudoephedrine-Guaifenesin (MUCINEX D MAX STRENGTH) [phone] MG TB12 Take 1 tablet by mouth every evening.   tiotropium (SPIRIVA  HANDIHALER) 18 MCG inhalation capsule Place 1 capsule (18 mcg total) into inhaler and inhale daily.   tiZANidine  (ZANAFLEX ) 4 MG tablet Take 1 tablet (4 mg total) by mouth 2 (two) times daily as needed for muscle spasms. Primarily at night, since daytime use will make drowsy   No facility-administered encounter medications on file as of 04/24/2024.   "

## 2024-04-24 ENCOUNTER — Encounter: Payer: Self-pay | Admitting: Family Medicine

## 2024-04-24 ENCOUNTER — Ambulatory Visit: Admitting: Family Medicine

## 2024-04-24 VITALS — BP 126/68 | HR 63 | Temp 98.9°F | Ht 64.5 in | Wt 126.0 lb

## 2024-04-24 DIAGNOSIS — Z720 Tobacco use: Secondary | ICD-10-CM | POA: Diagnosis not present

## 2024-04-24 DIAGNOSIS — E119 Type 2 diabetes mellitus without complications: Secondary | ICD-10-CM

## 2024-04-24 DIAGNOSIS — Z8701 Personal history of pneumonia (recurrent): Secondary | ICD-10-CM | POA: Diagnosis not present

## 2024-04-24 DIAGNOSIS — I2510 Atherosclerotic heart disease of native coronary artery without angina pectoris: Secondary | ICD-10-CM | POA: Diagnosis not present

## 2024-04-24 DIAGNOSIS — J9601 Acute respiratory failure with hypoxia: Secondary | ICD-10-CM | POA: Diagnosis not present

## 2024-04-24 DIAGNOSIS — J4489 Other specified chronic obstructive pulmonary disease: Secondary | ICD-10-CM | POA: Diagnosis not present

## 2024-04-24 DIAGNOSIS — J189 Pneumonia, unspecified organism: Secondary | ICD-10-CM

## 2024-04-24 MED ORDER — BUPROPION HCL ER (XL) 150 MG PO TB24: 150.0000 mg | ORAL_TABLET | Freq: Every day | ORAL | 3 refills | Status: DC

## 2024-04-24 MED ORDER — FLUTICASONE-SALMETEROL 250-50 MCG/ACT IN AEPB: 1.0000 | INHALATION_SPRAY | Freq: Two times a day (BID) | RESPIRATORY_TRACT | 3 refills | Status: DC

## 2024-05-02 ENCOUNTER — Ambulatory Visit: Payer: Self-pay

## 2024-05-02 NOTE — Telephone Encounter (Signed)
 FYI Only or Action Required?: Action required by provider: Requesting call back.  Patient was last seen in primary care on 04/24/2024 by [name], MD.  Called Nurse Triage reporting Shortness of Breath.  Symptoms began several weeks ago.  Interventions attempted: Prescription medications: Azithromycin , Prednisone .  Symptoms are: gradually worsening.  Triage Disposition: Go to ED Now (Notify PCP)  Patient/caregiver understands and will follow disposition?: Yes  Copied from CRM #[id number]. Topic: Clinical - Red Word Triage >> May 02, 2024  3:42 PM [place] A wrote: [facility] that prompted transfer to Nurse Triage: Patient had Respiratory Failure and was in the hospital. Patient is having shortness of breath. Reason for Disposition  Oxygen level (e.g., pulse oximetry) 90% or lower  Answer Assessment - Initial Assessment Questions Patient's wife Mitzy calling on behalf of patient. Mucous gone from clear back to yellow starting Saturday. Finished antibiotics from Pneumonia, Compliant with nebulizer treatments and inhalers. Patient's wife feels like he's getting worse. Patient's wife hears audible wheezing and can see patient is using accessory muscles to breath O2 at rest is 88%. Advised patient to ED and patient's wife states he is absolutely refusing and won't do it. Requesting extension of antibiotics be sent in, or requesting call back or appointment with PCP.    1. RESPIRATORY STATUS: Describe your breathing? (e.g., wheezing, shortness of breath, unable to speak, severe coughing)      SOB, mucous from clear to yellow.  2. ONSET: When did this breathing problem begin?      On going, diagnosed with pneumonia on 8/16, was on antibiotics.  3. PATTERN Does the difficult breathing come and go, or has it been constant since it started?      When exerting himself is experiences SOB,    9. OTHER SYMPTOMS: Do you have any other symptoms? (e.g., chest pain, cough, dizziness,  fever, runny nose)     Dizziness yesterday  10. O2 SATURATION MONITOR:  Do you use an oxygen saturation monitor (pulse oximeter) at home? If Yes, ask: What is your reading (oxygen level) today? What is your usual oxygen saturation reading? (e.g., 95%)       88% at rest last night.  Protocols used: Breathing Difficulty-A-AH

## 2024-05-03 ENCOUNTER — Other Ambulatory Visit: Payer: Self-pay | Admitting: Family Medicine

## 2024-05-03 MED ORDER — PREDNISONE 20 MG PO TABS: ORAL_TABLET | ORAL | 0 refills | Status: DC

## 2024-05-03 MED ORDER — AZITHROMYCIN 250 MG PO TABS: ORAL_TABLET | ORAL | 0 refills | Status: AC

## 2024-05-03 NOTE — Addendum Note (Signed)
 Addended by: [name] on: 05/03/2024 10:49 AM   Modules accepted: Orders

## 2024-05-03 NOTE — Telephone Encounter (Signed)
 Spoke with Mr. Iovino.  He states he is not sure if it's the anti-smoking pill he is taking but he has no energy.  He still has a productive cough with greenish phlegm as well as nasal congestion with yellowish/green mucus.  He has not check his oxygen level today.  He will look around to see if he can find the pulse ox and call us  back with his level.

## 2024-05-03 NOTE — Telephone Encounter (Signed)
 Isaiah Rangel notified as instructed by telephone.  States understanding.

## 2024-05-03 NOTE — Telephone Encounter (Signed)
 Can you check on Isaiah Rangel?  He has significant COPD and recent pneumonia.  How is he feeling?  Have they checked his oxygen level this morning?

## 2024-05-03 NOTE — Telephone Encounter (Unsigned)
 Copied from CRM #[id number]. Topic: General - Other >> May 03, 2024 10:17 AM Mesmerise C wrote: Reason for CRM: Patient's wife Mitzi calling back to provide O2 levels stated after breathing treatment is 92-94 spoke to [place] from [facility] advised was trying to get in contact with CMA but with patient right now and to send crm patient can be reached at [phone]

## 2024-05-03 NOTE — Telephone Encounter (Signed)
 I sent him in a refill on his antibiotics and prednisone , but I want him to be very careful.  If his shortness of breath gets worse or oxygenation worse, then he definitely needs to be seen.  He is just getting over pneumonia and a COPD flare.

## 2024-05-16 ENCOUNTER — Other Ambulatory Visit: Payer: Self-pay | Admitting: Family Medicine

## 2024-06-24 ENCOUNTER — Other Ambulatory Visit: Payer: Self-pay | Admitting: Family Medicine

## 2024-06-24 DIAGNOSIS — E785 Hyperlipidemia, unspecified: Secondary | ICD-10-CM

## 2024-06-26 ENCOUNTER — Other Ambulatory Visit: Payer: Self-pay | Admitting: Family Medicine

## 2024-06-26 DIAGNOSIS — I951 Orthostatic hypotension: Secondary | ICD-10-CM

## 2024-06-26 NOTE — Telephone Encounter (Signed)
 Last office visit 04/24/2024 for Hospital Follow Up.  Last refilled 02/29/2024 for #60 with 3 refill.  Next appt: CPE 10/25/2024.

## 2024-06-26 NOTE — Telephone Encounter (Signed)
 Last office visit 04/24/2024 for hosptial follow up.  Last refilled 12/06/23 for #180 with 1 refill.  Next Appt: CPE 10/25/2024.

## 2024-08-30 ENCOUNTER — Ambulatory Visit (INDEPENDENT_AMBULATORY_CARE_PROVIDER_SITE_OTHER): Payer: Self-pay

## 2024-08-30 ENCOUNTER — Ambulatory Visit (HOSPITAL_COMMUNITY): Payer: Self-pay

## 2024-08-30 ENCOUNTER — Ambulatory Visit
Admission: EM | Admit: 2024-08-30 | Discharge: 2024-08-30 | Disposition: A | Discharge status: Home / Self Care | Source: Home / Self Care

## 2024-08-30 ENCOUNTER — Other Ambulatory Visit: Payer: Self-pay | Admitting: Family Medicine

## 2024-08-30 ENCOUNTER — Ambulatory Visit: Payer: Self-pay

## 2024-08-30 DIAGNOSIS — J441 Chronic obstructive pulmonary disease with (acute) exacerbation: Secondary | ICD-10-CM | POA: Diagnosis not present

## 2024-08-30 DIAGNOSIS — M25552 Pain in left hip: Secondary | ICD-10-CM | POA: Diagnosis not present

## 2024-08-30 DIAGNOSIS — R110 Nausea: Secondary | ICD-10-CM

## 2024-08-30 DIAGNOSIS — M7022 Olecranon bursitis, left elbow: Secondary | ICD-10-CM | POA: Diagnosis not present

## 2024-08-30 MED ORDER — PREDNISONE 10 MG PO TABS: 40.0000 mg | ORAL_TABLET | Freq: Every day | ORAL | 0 refills | Status: AC

## 2024-08-30 MED ORDER — ONDANSETRON 4 MG PO TBDP: 4.0000 mg | ORAL_TABLET | Freq: Three times a day (TID) | ORAL | 0 refills | Status: AC | PRN

## 2024-08-30 MED ORDER — GABAPENTIN 300 MG PO CAPS: 300.0000 mg | ORAL_CAPSULE | Freq: Every day | ORAL | 2 refills | Status: AC

## 2024-08-30 NOTE — ED Provider Notes (Signed)
 " Isaiah Rangel    CSN: [id number] Arrival date & time: 08/30/24  1334      History   Chief Complaint Chief Complaint  Patient presents with   Joint Swelling    HPI Isaiah Rangel is a 63 y.o. male.  With his wife on the phone, patient presents with 10-day history of left elbow redness, swelling, pain.  No injury.  His wife wrapped his elbow with castor oil and the swelling improved.  No open wounds or drainage.  No numbness or weakness.  Patient also presents with left lower back and hip pain that radiates to his left knee.  He states this pain has been going on for several months and causes his leg to give out which has caused him to fall.  Most recently, he was standing at home when his leg gave out and he fell to the right side into the wall.  He did not fall to the floor or lose consciousness.  Patient also presents with cough with increased production of sputum.  His wife listened to his lungs and states she heard crackles.  She had leftover doxycycline  at home and started him on this on 08/25/2024.  He has been taking doxycycline  twice a day x 5 days.  Patient reports no fever or unusual shortness of breath.  Patient's wife also reports he has had some nausea with dry heaving in the mornings.  She would like prescription for Zofran .  Patient contacted his PCP's office today and was prescribed gabapentin for his leg pain.  He is on oral diclofenac  at home also.  His medical history includes COPD, NSTEMI, heart failure, diastolic dysfunction, hyperlipidemia, diabetes.  The history is provided by the patient, the spouse and medical records.    Past Medical History:  Diagnosis Date   Basal cell carcinoma    BPH (benign prostatic hyperplasia)    Cerebrovascular disease (but no CVA) 04/29/2015   COPD (chronic obstructive pulmonary disease) (HCC)    Coronary artery disease/Coronary Vasospasm    a. 03/2013 Cath: min irregs. EF >55%; b. 03/2019 Cath: LM nl, LAD nl, LCX nl, OM1 40,  RCA 30p, 73m-->40% after IC NTG; c. 12/2019 MV: EF 65%, no ischemia/infarct-->low risk.   Diabetes mellitus type 2, diet-controlled (HCC) 02/05/2021   Diastolic dysfunction    a. 03/2019 Echo: EF 50-55%, nl RV size/fxn. Trace MR/TR; b. 09/2019 Echo: EF 60-65%, mild LVH, GrI DD, no rwma, triv MR, mild AoV sclerosis w/o stenosis.   GAD (generalized anxiety disorder)    GERD (gastroesophageal reflux disease)    History of kidney stones    Hyperlipidemia    Myocardial infarction (HCC) 03/2019   NO STENTS   Squamous cell carcinoma of skin    Tobacco abuse     Patient Active Problem List   Diagnosis Date Noted   Acute hypoxic respiratory failure (HCC) 04/16/2024   Chronic diastolic CHF (congestive heart failure) (HCC) 04/16/2024   GAD (generalized anxiety disorder) 12/15/2022   Diabetes mellitus type 2, diet-controlled (HCC) 02/05/2021   Nephrolithiasis 06/13/2020   Orthostatic hypotension 06/11/2020   Depression    NSTEMI (non-ST elevated myocardial infarction) (HCC) 03/20/2019   COPD (chronic obstructive pulmonary disease) with chronic bronchitis (HCC) 09/05/2018   Coronary artery disease involving native coronary artery of native heart without angina pectoris 02/21/2016   Cerebrovascular disease (but no CVA) 04/29/2015   S/P cardiac catheterization 04/19/2013   Smoking 09/09/2011   Mixed hyperlipidemia 04/09/2010   GERD 09/21/2008  Past Surgical History:  Procedure Laterality Date   BACK SURGERY  1998   NO METAL   CARDIAC CATHETERIZATION  04/06/13   [facility]- minor luminal irregularities, otherwise normal cors; EF > 55%. Medical management and smoking cessation recommended   CATARACT EXTRACTION W/PHACO Right 08/28/2020   Procedure: CATARACT EXTRACTION PHACO AND INTRAOCULAR LENS PLACEMENT (IOC) RIGHT [name];  Surgeon: Mittie Gaskin, MD;  Location: [facility];  Service: Ophthalmology;  Laterality: Right;  1.77 0:31.9 5.6%   CYSTOSCOPY/URETEROSCOPY/HOLMIUM  LASER/STENT PLACEMENT Left 06/13/2020   Procedure: CYSTOSCOPY/URETEROSCOPY//STENT PLACEMENT;  Surgeon: Penne Knee, MD;  Location: [facility];  Service: Urology;  Laterality: Left;   CYSTOSCOPY/URETEROSCOPY/HOLMIUM LASER/STENT PLACEMENT Left 06/24/2020   Procedure: CYSTOSCOPY/URETEROSCOPY/HOLMIUM LASER/STENT PLACEMENT;  Surgeon: Penne Knee, MD;  Location: [facility];  Service: Urology;  Laterality: Left;   KNEE ARTHROSCOPY W/ PARTIAL MEDIAL MENISCECTOMY Right 2012   [place], 2012   KNEE ARTHROSCOPY WITH MEDIAL MENISECTOMY Left 11/13/2020   Procedure: Left knee arthroscopy with partial medial or lateral menisectomy, possible chondroplasty, possible partial synovectomy;  Surgeon: Leora Lynwood SAUNDERS, MD;  Location: [facility];  Service: Orthopedics;  Laterality: Left;   LEFT HEART CATH AND CORONARY ANGIOGRAPHY N/A 03/20/2019   Procedure: LEFT HEART CATH AND CORONARY ANGIOGRAPHY;  Surgeon: Burnard Debby LABOR, MD;  Location: [facility];  Service: Cardiovascular;  Laterality: N/A;   NASAL SINUS SURGERY     neck fusion  2000   TONSILLECTOMY     VASECTOMY         Home Medications    Prior to Admission medications  Medication Sig Start Date End Date Taking? Authorizing Provider  gabapentin (NEURONTIN) 300 MG capsule Take 1 capsule (300 mg total) by mouth at bedtime. 08/30/24   [name], Jacques, MD  ondansetron  (ZOFRAN -ODT) 4 MG disintegrating tablet Take 1 tablet (4 mg total) by mouth every 8 (eight) hours as needed for nausea or vomiting. 08/30/24  Yes Corlis Burnard DEL, NP  predniSONE  (DELTASONE ) 10 MG tablet Take 4 tablets (40 mg total) by mouth daily for 5 days. 08/30/24 09/04/24 Yes Corlis Burnard DEL, NP  albuterol  (VENTOLIN  HFA) 108 (90 Base) MCG/ACT inhaler Inhale 2 puffs into the lungs every 6 (six) hours as needed for wheezing or shortness of breath. 04/17/24   [name], Jacques, MD  amLODipine  (NORVASC ) 2.5 MG tablet TAKE 1 TABLET BY MOUTH EVERY DAY 11/15/23   [name], Jacques, MD  aspirin  EC 81 MG  tablet Take 81 mg by mouth daily.    [provider]  atorvastatin  (LIPITOR) 80 MG tablet TAKE 1 TABLET BY MOUTH EVERY DAY 11/15/23   [name], Jacques, MD  blood glucose meter kit and supplies KIT Dispense based on patient and insurance preference. Use up to two times daily as directed. 06/11/21   [name], Jacques, MD  buPROPion  (WELLBUTRIN  XL) 150 MG 24 hr tablet TAKE 1 TABLET BY MOUTH EVERY DAY 05/16/24   [name], Jacques, MD  diclofenac  (VOLTAREN ) 75 MG EC tablet TAKE 1 TABLET(75 MG) BY MOUTH TWICE DAILY 06/26/24   [name], Jacques, MD  escitalopram  (LEXAPRO ) 20 MG tablet TAKE 1 TABLET BY MOUTH EVERY DAY 11/15/23   [name], Jacques, MD  ezetimibe  (ZETIA ) 10 MG tablet TAKE 1 TABLET BY MOUTH EVERY DAY 06/26/24   [name], Jacques, MD  fludrocortisone  (FLORINEF ) 0.1 MG tablet TAKE 2 TABLETS (200 MCG TOTAL) BY MOUTH DAILY. 06/26/24   [name], Jacques, MD  fluticasone -salmeterol (ADVAIR) 250-50 MCG/ACT AEPB Inhale 1 puff into the lungs in the morning and at bedtime. 04/24/24  [name], Spencer, MD  ibuprofen (ADVIL) 200 MG tablet Take 200-800 mg by mouth every 6 (six) hours as needed for moderate pain.    [provider]  ipratropium-albuterol  (DUONEB) 0.5-2.5 (3) MG/3ML SOLN Take 3 mLs by nebulization every 6 (six) hours as needed. 04/17/24   [name], Jacques, MD  isosorbide  mononitrate (IMDUR ) 30 MG 24 hr tablet TAKE 1 TABLET BY MOUTH EVERY DAY 11/15/23   [name], Jacques, MD  Multiple Vitamin (MULTIVITAMIN WITH MINERALS) TABS tablet Take 1 tablet by mouth daily.    [provider]  nitroGLYCERIN  (NITROSTAT ) 0.4 MG SL tablet DISSOLVE 1 TABLET UNDER THE TONGUE EVERY 5 MINUTES AS NEEDED FOR CHEST PAIN 06/06/19   Gollan, Timothy J, MD  Omega-3 Fatty Acids (FISH OIL) 1200 MG CAPS Take 2,400 mg by mouth daily.    [provider]  Pseudoephedrine-Guaifenesin (MUCINEX D MAX STRENGTH) [phone] MG TB12 Take 1 tablet by mouth every evening.    [provider]  tiotropium  (SPIRIVA  HANDIHALER) 18 MCG inhalation capsule Place 1 capsule (18 mcg total) into inhaler and inhale daily. 12/14/22   [name], Jacques, MD  tiZANidine  (ZANAFLEX ) 4 MG tablet Take 1 tablet (4 mg total) by mouth 2 (two) times daily as needed for muscle spasms. Primarily at night, since daytime use will make drowsy 08/27/22   [name], Jacques, MD    Family History Family History  Problem Relation Age of Onset   Arthritis Father    Coronary artery disease Father    Hypertension Father    CVA Father 48   Colon cancer Mother    Heart attack Brother    Heart attack Brother    Coronary artery disease Brother    Esophageal cancer Neg Hx    Stomach cancer Neg Hx    Rectal cancer Neg Hx     Social History Social History[1]   Allergies   Other   Review of Systems Review of Systems  Constitutional:  Negative for chills and fever.  Respiratory:  Positive for cough. Negative for shortness of breath.   Musculoskeletal:  Positive for arthralgias, gait problem and joint swelling.  Skin:  Positive for color change. Negative for wound.  Neurological:  Negative for weakness and numbness.     Physical Exam Triage Vital Signs ED Triage Vitals  Encounter Vitals Group     BP 08/30/24 1446 (!) 162/88     Girls Systolic BP Percentile --      Girls Diastolic BP Percentile --      Boys Systolic BP Percentile --      Boys Diastolic BP Percentile --      Pulse Rate 08/30/24 1446 73     Resp 08/30/24 1446 16     Temp 08/30/24 1446 97.9 F (36.6 C)     Temp src --      SpO2 08/30/24 1446 98 %     Weight --      Height --      Head Circumference --      Peak Flow --      Pain Score 08/30/24 1445 0     Pain Loc --      Pain Education --      Exclude from Growth Chart --    No data found.  Updated Vital Signs BP (!) 162/88   Pulse 73   Temp 97.9 F (36.6 C)   Resp 16   SpO2 98%   Visual Acuity Right Eye Distance:   Left Eye Distance:  Bilateral Distance:    Right Eye Near:    Left Eye Near:    Bilateral Near:     Physical Exam Constitutional:      General: He is not in acute distress. HENT:     Mouth/Throat:     Mouth: Mucous membranes are moist.  Cardiovascular:     Rate and Rhythm: Normal rate and regular rhythm.     Heart sounds: Normal heart sounds.  Pulmonary:     Effort: Pulmonary effort is normal. No respiratory distress.     Breath sounds: Normal breath sounds.  Musculoskeletal:        General: Swelling and tenderness present. No deformity. Normal range of motion.     Comments: Left elbow edematous with mild erythema and warmth.  No wounds.  FROM, sensation intact, strength 5/5.  2+ radial pulse.  Skin:    General: Skin is warm and dry.     Capillary Refill: Capillary refill takes less than 2 seconds.     Findings: Erythema present. No bruising or lesion.  Neurological:     General: No focal deficit present.     Mental Status: He is alert.     Sensory: No sensory deficit.     Motor: No weakness.     Gait: Gait abnormal.     Comments: Ambulatory with cane.      UC Treatments / Results  Labs (all labs ordered are listed, but only abnormal results are displayed) Labs Reviewed - No data to display  EKG   Radiology DG Elbow Complete Left Result Date: 08/30/2024 EXAM: 3 VIEW(S) XRAY OF THE LEFT ELBOW COMPARISON: None available. CLINICAL HISTORY: pain, swelling, redness FINDINGS: BONES AND JOINTS: No acute fracture. No malalignment. Mild degenerative changes of the elbow. SOFT TISSUES: Soft tissue edema along the anterior and posterior aspect of the elbow. Likely joint effusion. IMPRESSION: 1. Soft tissue edema along the anterior and posterior aspect of the elbow. Likely joint effusion. Electronically signed by: Morgane Naveau MD 08/30/2024 04:41 PM EST RP Workstation: [id number]    Procedures Procedures (including critical care time)  Medications Ordered in UC Medications - No data to display  Initial Impression / Assessment and  Plan / UC Course  I have reviewed the triage vital signs and the nursing notes.  Pertinent labs & imaging results that were available during my care of the patient were reviewed by me and considered in my medical decision making (see chart for details).    Left olecranon bursitis, left hip pain, COPD exacerbation, nausea.  Afebrile and vital signs are stable.  Lungs are clear at this time and O2 sat is 98% on room air.  Patient's wife is on the telephone and providing helpful additional information about his symptoms.  She started him on doxycycline  on 08/25/2024 and he has been taking this twice a day for the last 5 days.  She request prescription for Zofran  and this is sent to his pharmacy.  Prednisone  also sent to his pharmacy for COPD exacerbation.  Instructed him to complete the 7-day course of doxycycline  that his wife has been giving him.  Education provided on COPD exacerbation and nausea.  Instructed him to follow-up with his PCP as soon as possible.  Xray of left elbow shows Soft tissue edema along the anterior and posterior aspect of the elbow. Likely joint effusion.   Treating elbow bursitis with Ace wrap and sling.  Patient is already on Voltaren .  And as previously noted is on doxycycline .  Instructed  him to follow-up with an orthopedist.  Contact information for on-call Ortho provided.  Education provided on elbow bursitis and hip pain.  Also discussed with patient that his doctor sent gabapentin to his pharmacy today to help with his leg pain.  Instructed him to take the gabapentin as directed.  ED precautions given.  Patient agrees to plan of care.  Final Clinical Impressions(s) / UC Diagnoses   Final diagnoses:  Olecranon bursitis of left elbow  Left hip pain  COPD exacerbation (HCC)  Nausea without vomiting     Discharge Instructions      Wear the Ace wrap and sling as directed for your elbow bursitis.  Continue taking the Voltaren  as directed.  Follow-up with an orthopedist  such as the one listed below.    Your doctor sent gabapentin to your pharmacy today.  Take this as directed.    Take the prednisone  and Zofran  as directed.  Finish the 7-day course of doxycycline  that your wife has been giving you.    Follow up with your primary care provider.  Go to the emergency department if you have worsening symptoms.        ED Prescriptions     Medication Sig Dispense Auth. Provider   predniSONE  (DELTASONE ) 10 MG tablet Take 4 tablets (40 mg total) by mouth daily for 5 days. 20 tablet Corlis Burnard DEL, NP   ondansetron  (ZOFRAN -ODT) 4 MG disintegrating tablet Take 1 tablet (4 mg total) by mouth every 8 (eight) hours as needed for nausea or vomiting. 20 tablet Corlis Burnard DEL, NP      PDMP not reviewed this encounter.     [1]  Social History Tobacco Use   Smoking status: Every Day    Current packs/day: 1.00    Average packs/day: 1 pack/day for 40.0 years (40.0 ttl pk-yrs)    Types: Cigarettes   Smokeless tobacco: Never   Tobacco comments:    1/2 ppd  Vaping Use   Vaping status: Never Used  Substance Use Topics   Alcohol use: No    Alcohol/week: 0.0 standard drinks of alcohol   Drug use: No     Corlis Burnard DEL, NP 08/30/24 1703  "

## 2024-08-30 NOTE — ED Triage Notes (Signed)
 Patient to Urgent Care with complaints of lower back pain that radiates into his left knee. Reports the pain caused him to fall a few days ago.  Also reports left sided elbow swelling and redness. No known injury. Symptoms x10 days. Possible fever on Saturday (felt like he was coming down with something).

## 2024-08-30 NOTE — Telephone Encounter (Signed)
 Mr. Magoon notified as instructed by telephone.  Appointment moved to 09/04/2024 at 11:40 am with Dr. Watt.

## 2024-08-30 NOTE — Telephone Encounter (Signed)
 I sent him in Gabapentin 300 mg po at bedtime.  I am out of the office through the New Year.  I have on opening on Monday, 1/5.

## 2024-08-30 NOTE — Discharge Instructions (Addendum)
 Wear the Ace wrap and sling as directed for your elbow bursitis.  Continue taking the Voltaren  as directed.  Follow-up with an orthopedist such as the one listed below.    Your doctor sent gabapentin to your pharmacy today.  Take this as directed.    Take the prednisone  and Zofran  as directed.  Finish the 7-day course of doxycycline  that your wife has been giving you.    Follow up with your primary care provider.  Go to the emergency department if you have worsening symptoms.

## 2024-08-30 NOTE — Telephone Encounter (Signed)
 FYI Only or Action Required?: Pt's wife requesting pt to get on wait list for sooner appt before 09/06/24 and for PCP to prescribe gabapentin in the mean time - please call pt first and then wife if pt doesn't pick up.  Patient was last seen in primary care on 04/24/2024 by [name], MD.  Called Nurse Triage reporting Hip Pain.  Symptoms began several months ago.  Interventions attempted: Prescription medications: Diclofenac  75mg  .  Symptoms are: gradually worsening.  Triage Disposition: See PCP When Office is Open (Within 3 Days)  Patient/caregiver understands and will follow disposition?: No, wishes to speak with PCP   Reason for Disposition  [1] MODERATE pain (e.g., interferes with normal activities, limping) AND [2] present > 3 days  Answer Assessment - Initial Assessment Questions Pt's wife calling to report the pt is experiencing pain in the left hip that radiates to the knee - onset about a month or two. The pain comes and goes and is sharp, touch/pressure cause pain, hip area warm to the touch.  Pain is causing the pt's leg to give out and have falls - fell on 08/24/24. Almost fell again on 08/29/24 and wife had to assist him inside house. Pt's wife states that the pt is stubborn and had to convince the pt to even be willing to go in for an appt but states symptoms are getting worse.  Pt takes Diclofenac  once a day.  Pt's wife requesting pt to get on wait list for sooner appt before 09/06/24 and for PCP to prescribe gabapentin in the mean time - please call pt first and then wife if pt doesn't pick up.  1. LOCATION and RADIATION: Where is the pain located? Does the pain spread (shoot) anywhere else?     Left hip pain that radiates to the left knee 2. QUALITY: What does the pain feel like?  (e.g., sharp, dull, aching, burning)     Sharp  3. SEVERITY: How bad is the pain? What does it keep you from doing?   (Scale 1-10; or mild, moderate, severe)     N/a 4.  ONSET: When did the pain start? Does it come and go, or is it there all the time?     Onset About a month or two, comes and goes  5. WORK OR EXERCISE: Has there been any recent work or exercise that involved this part of the body?      Yes - pt works in the yard 6. CAUSE: What do you think is causing the hip pain?      N/a 7. AGGRAVATING FACTORS: What makes the hip pain worse? (e.g., walking, climbing stairs, running)     Movement  8. OTHER SYMPTOMS: Do you have any other symptoms? (e.g., back pain, pain shooting down leg,  fever, rash)     Pain shooting down leg  Protocols used: Hip Pain-A-AH  Copied from CRM #[id number]. Topic: Clinical - Red Word Triage >> Aug 30, 2024  9:08 AM Larissa RAMAN wrote: [facility] that prompted transfer to Nurse Triage: LT hip and leg pain, pt fell on 12/24.

## 2024-08-31 ENCOUNTER — Other Ambulatory Visit: Payer: Self-pay | Admitting: Family Medicine

## 2024-09-03 NOTE — Progress Notes (Unsigned)
 "    Isaiah Kluttz T. Isaiah Demaria, MD, CAQ Sports Medicine [facility] [address] [place], [address]  Phone: [phone]  FAX: [phone]  Isaiah Rangel - 64 y.o. male  MRN [id number]  Date of Birth: [birth year]  Date: 09/04/2024  PCP: [name], MD  Referral: [name], MD  No chief complaint on file.  Subjective:   Isaiah Rangel is a 64 y.o. very pleasant male patient with There is no height or weight on file to calculate BMI. who presents with the following:  Discussed the use of AI scribe software for clinical note transcription with the patient, who gave verbal consent to proceed.  Isaiah Rangel is here for evaluation and treatment of ongoing back and hip pain.  History is significant for history of cerebrovascular disease, MI, diabetes, coronary disease.  History of back surgery in 1998.  He was having some pain in the left posterior hip with radiation to the knee.  I did send him in some gabapentin  300 mg to take at nighttime.  He also takes diclofenac  twice a day. History of Present Illness     Review of Systems is noted in the HPI, as appropriate  Objective:   There were no vitals taken for this visit.  GEN: No acute distress; alert,appropriate. PULM: Breathing comfortably in no respiratory distress PSYCH: Normally interactive.   Laboratory and Imaging Data:  Assessment and Plan:   No diagnosis found. Assessment & Plan   Medication Management during today's office visit: No orders of the defined types were placed in this encounter.  There are no discontinued medications.  Orders placed today for conditions managed today: No orders of the defined types were placed in this encounter.   Disposition: No follow-ups on file.  [employer] One speech-to-text software was used for transcription in this dictation.  Possible transcriptional errors can occur using Animal nutritionist.   Signed,  Isaiah Rangel. Isaiah Linebaugh,  MD   Outpatient Encounter Medications as of 09/04/2024  Medication Sig   gabapentin  (NEURONTIN ) 300 MG capsule Take 1 capsule (300 mg total) by mouth at bedtime.   albuterol  (VENTOLIN  HFA) 108 (90 Base) MCG/ACT inhaler Inhale 2 puffs into the lungs every 6 (six) hours as needed for wheezing or shortness of breath.   amLODipine  (NORVASC ) 2.5 MG tablet TAKE 1 TABLET BY MOUTH EVERY DAY   aspirin  EC 81 MG tablet Take 81 mg by mouth daily.   atorvastatin  (LIPITOR) 80 MG tablet TAKE 1 TABLET BY MOUTH EVERY DAY   blood glucose meter kit and supplies KIT Dispense based on patient and insurance preference. Use up to two times daily as directed.   buPROPion  (WELLBUTRIN  XL) 150 MG 24 hr tablet TAKE 1 TABLET BY MOUTH EVERY DAY   diclofenac  (VOLTAREN ) 75 MG EC tablet TAKE 1 TABLET(75 MG) BY MOUTH TWICE DAILY   escitalopram  (LEXAPRO ) 20 MG tablet TAKE 1 TABLET BY MOUTH EVERY DAY   ezetimibe  (ZETIA ) 10 MG tablet TAKE 1 TABLET BY MOUTH EVERY DAY   fludrocortisone  (FLORINEF ) 0.1 MG tablet TAKE 2 TABLETS (200 MCG TOTAL) BY MOUTH DAILY.   fluticasone -salmeterol (WIXELA INHUB) 250-50 MCG/ACT AEPB INHALE 1 PUFF INTO THE LUNGS IN THE MORNING AND AT BEDTIME.   ibuprofen (ADVIL) 200 MG tablet Take 200-800 mg by mouth every 6 (six) hours as needed for moderate pain.   ipratropium-albuterol  (DUONEB) 0.5-2.5 (3) MG/3ML SOLN Take 3 mLs by nebulization every 6 (six) hours as needed.   isosorbide  mononitrate (IMDUR ) 30  MG 24 hr tablet TAKE 1 TABLET BY MOUTH EVERY DAY   Multiple Vitamin (MULTIVITAMIN WITH MINERALS) TABS tablet Take 1 tablet by mouth daily.   nitroGLYCERIN  (NITROSTAT ) 0.4 MG SL tablet DISSOLVE 1 TABLET UNDER THE TONGUE EVERY 5 MINUTES AS NEEDED FOR CHEST PAIN   Omega-3 Fatty Acids (FISH OIL) 1200 MG CAPS Take 2,400 mg by mouth daily.   ondansetron  (ZOFRAN -ODT) 4 MG disintegrating tablet Take 1 tablet (4 mg total) by mouth every 8 (eight) hours as needed for nausea or vomiting.   predniSONE  (DELTASONE ) 10 MG  tablet Take 4 tablets (40 mg total) by mouth daily for 5 days.   Pseudoephedrine-Guaifenesin (MUCINEX D MAX STRENGTH) [id number] MG TB12 Take 1 tablet by mouth every evening.   tiotropium (SPIRIVA  HANDIHALER) 18 MCG inhalation capsule Place 1 capsule (18 mcg total) into inhaler and inhale daily.   tiZANidine  (ZANAFLEX ) 4 MG tablet Take 1 tablet (4 mg total) by mouth 2 (two) times daily as needed for muscle spasms. Primarily at night, since daytime use will make drowsy   No facility-administered encounter medications on file as of 09/04/2024.   "

## 2024-09-04 ENCOUNTER — Encounter: Payer: Self-pay | Admitting: Family Medicine

## 2024-09-04 ENCOUNTER — Ambulatory Visit: Payer: Self-pay | Admitting: Family Medicine

## 2024-09-04 ENCOUNTER — Ambulatory Visit (INDEPENDENT_AMBULATORY_CARE_PROVIDER_SITE_OTHER)
Admission: RE | Admit: 2024-09-04 | Discharge: 2024-09-04 | Disposition: A | Discharge status: Home / Self Care | Source: Ambulatory Visit | Attending: Family Medicine | Admitting: Family Medicine

## 2024-09-04 VITALS — BP 150/86 | HR 69 | Temp 98.0°F | Ht 64.5 in | Wt 130.2 lb

## 2024-09-04 DIAGNOSIS — M25552 Pain in left hip: Secondary | ICD-10-CM

## 2024-09-04 DIAGNOSIS — R29818 Other symptoms and signs involving the nervous system: Secondary | ICD-10-CM

## 2024-09-04 DIAGNOSIS — M5416 Radiculopathy, lumbar region: Secondary | ICD-10-CM

## 2024-09-04 DIAGNOSIS — M5137 Other intervertebral disc degeneration, lumbosacral region with discogenic back pain only: Secondary | ICD-10-CM | POA: Diagnosis not present

## 2024-09-04 MED ORDER — PREDNISONE 20 MG PO TABS: ORAL_TABLET | ORAL | 0 refills | Status: AC

## 2024-09-04 MED ORDER — PREGABALIN 50 MG PO CAPS: 50.0000 mg | ORAL_CAPSULE | Freq: Three times a day (TID) | ORAL | 3 refills | Status: AC

## 2024-09-06 ENCOUNTER — Ambulatory Visit: Admitting: Family Medicine

## 2024-09-13 ENCOUNTER — Ambulatory Visit: Payer: Self-pay | Admitting: Family Medicine

## 2024-10-04 ENCOUNTER — Ambulatory Visit: Payer: Self-pay

## 2024-10-04 DIAGNOSIS — R29818 Other symptoms and signs involving the nervous system: Secondary | ICD-10-CM

## 2024-10-04 DIAGNOSIS — M545 Low back pain, unspecified: Secondary | ICD-10-CM

## 2024-10-04 DIAGNOSIS — M51371 Other intervertebral disc degeneration, lumbosacral region with lower extremity pain only: Secondary | ICD-10-CM

## 2024-10-04 DIAGNOSIS — Z9889 Other specified postprocedural states: Secondary | ICD-10-CM

## 2024-10-04 NOTE — Telephone Encounter (Signed)
 I am fine directly referring him to Neurosurgery.  I saw him one month ago for this and he has a complex spine history.

## 2024-10-04 NOTE — Telephone Encounter (Signed)
 FYI Only or Action Required?: Action required by provider: referral request and update on patient condition.  Patient was last seen in primary care on 09/04/2024 by [name], MD.  Called Nurse Triage reporting Back Pain.  Symptoms began several months ago.  Interventions attempted: Prescription medications: lyrica  tid.  Symptoms are: gradually worsening.  Triage Disposition: Go to ED Now (or PCP Triage)  Patient/caregiver understands and will follow disposition?: No, wishes to speak with PCP                      Message from [facility] sent at 10/04/2024  8:24 AM EST  Reason for Triage: Asking for referral to Neurologist for lower back, states he's in pain and very intense at times and already fell twice recently back is getting worse hard for him to walk, pain radiates from knee to lower back very sharp pains   Reason for Disposition  Weakness of a leg or foot (e.g., unable to bear weight, dragging foot)  Answer Assessment - Initial Assessment Questions Called [facility] and notified Emmie of patient's ED refusal. Patient refused ED due to cost, states it would cost over $1,000 to go.   1. ONSET: When did the pain begin? (e.g., minutes, hours, days)     Gradual since Novemeber  2. LOCATION: Where does it hurt? (upper, mid or lower back)     Lower back  3. SEVERITY: How bad is the pain?  (e.g., Scale 1-10; mild, moderate, or severe)     8/10. Sharp.   4. PATTERN: Is the pain constant? (e.g., yes, no; constant, intermittent)      Constant.  5. RADIATION: Does the pain shoot into your legs or somewhere else?     Left leg down to knee and foot.  6. CAUSE:  What do you think is causing the back pain?      N/A  7. BACK OVERUSE:  Any recent lifting of heavy objects, strenuous work or exercise?     N/A.  8. MEDICINES: What have you taken so far for the pain? (e.g., nothing, acetaminophen , NSAIDS)     Lyrica , tid.  9. NEUROLOGIC SYMPTOMS:  Do you have any weakness, numbness, or problems with bowel/bladder control?     Weakness of left leg, sometimes it is dragging. Fall about a week ago, landed on his buttocks on the ice. Left leg gives out and buckles. Having to use a walking stick.  10. OTHER SYMPTOMS: Do you have any other symptoms? (e.g., fever, abdomen pain, burning with urination, blood in urine)       Insomnia, tossing and turning at night due to pain. No passing out/fainting, abdominal pain, loss of bowel or bladder control, urinary retention  Protocols used: Back Pain-A-AH

## 2024-10-04 NOTE — Telephone Encounter (Signed)
 Mr. Cifelli notified by telephone that Dr. Watt is placing the referral to neurosurgery.

## 2024-10-18 ENCOUNTER — Other Ambulatory Visit

## 2024-10-25 ENCOUNTER — Encounter: Admitting: Family Medicine
